# Patient Record
Sex: Male | Born: 1960 | Race: Black or African American | Hispanic: No | Smoking: Never smoker
Health system: Southern US, Community
[De-identification: ages and names within clinical notes are randomized; demographics above are authoritative.]

## PROBLEM LIST (undated history)

## (undated) DIAGNOSIS — N529 Male erectile dysfunction, unspecified: Secondary | ICD-10-CM

## (undated) DIAGNOSIS — I10 Essential (primary) hypertension: Secondary | ICD-10-CM

## (undated) DIAGNOSIS — N179 Acute kidney failure, unspecified: Secondary | ICD-10-CM

## (undated) DIAGNOSIS — I5021 Acute systolic (congestive) heart failure: Secondary | ICD-10-CM

## (undated) DIAGNOSIS — R0600 Dyspnea, unspecified: Secondary | ICD-10-CM

## (undated) DIAGNOSIS — E785 Hyperlipidemia, unspecified: Secondary | ICD-10-CM

## (undated) DIAGNOSIS — N186 End stage renal disease: Secondary | ICD-10-CM

## (undated) DIAGNOSIS — N189 Chronic kidney disease, unspecified: Secondary | ICD-10-CM

## (undated) DIAGNOSIS — Z992 Dependence on renal dialysis: Secondary | ICD-10-CM

## (undated) HISTORY — DX: Essential (primary) hypertension: I10

## (undated) HISTORY — DX: Dependence on renal dialysis: Z99.2

## (undated) HISTORY — DX: Acute systolic (congestive) heart failure: I50.21

## (undated) HISTORY — DX: Male erectile dysfunction, unspecified: N52.9

## (undated) HISTORY — DX: Hyperlipidemia, unspecified: E78.5

## (undated) HISTORY — DX: End stage renal disease: N18.6

## (undated) HISTORY — DX: Chronic kidney disease, unspecified: N18.9

## (undated) HISTORY — PX: HERNIA REPAIR: SHX51

## (undated) HISTORY — DX: Acute kidney failure, unspecified: N17.9

## (undated) HISTORY — DX: Dyspnea, unspecified: R06.00

---

## 2004-10-12 ENCOUNTER — Ambulatory Visit: Payer: Self-pay | Admitting: Nurse Practitioner

## 2004-11-21 ENCOUNTER — Ambulatory Visit: Payer: Self-pay | Admitting: *Deleted

## 2005-02-05 ENCOUNTER — Ambulatory Visit: Payer: Self-pay | Admitting: Nurse Practitioner

## 2008-09-11 ENCOUNTER — Emergency Department (HOSPITAL_BASED_OUTPATIENT_CLINIC_OR_DEPARTMENT_OTHER): Admission: EM | Admit: 2008-09-11 | Discharge: 2008-09-11 | Payer: Self-pay | Admitting: Emergency Medicine

## 2008-10-27 ENCOUNTER — Emergency Department (HOSPITAL_COMMUNITY): Admission: EM | Admit: 2008-10-27 | Discharge: 2008-10-27 | Payer: Self-pay | Admitting: Family Medicine

## 2010-08-22 LAB — POCT CARDIAC MARKERS

## 2010-08-22 LAB — KETONES, QUALITATIVE: Acetone, Bld: NEGATIVE

## 2010-08-22 LAB — GLUCOSE, CAPILLARY: Glucose-Capillary: 321 mg/dL — ABNORMAL HIGH (ref 70–99)

## 2010-08-22 LAB — URINALYSIS, ROUTINE W REFLEX MICROSCOPIC
Hgb urine dipstick: NEGATIVE
Specific Gravity, Urine: 1.023 (ref 1.005–1.030)
Urobilinogen, UA: 1 mg/dL (ref 0.0–1.0)
pH: 5.5 (ref 5.0–8.0)

## 2010-08-22 LAB — DIFFERENTIAL
Basophils Relative: 1 % (ref 0–1)
Lymphocytes Relative: 26 % (ref 12–46)
Lymphs Abs: 2.2 10*3/uL (ref 0.7–4.0)
Monocytes Relative: 7 % (ref 3–12)
Neutro Abs: 5.6 10*3/uL (ref 1.7–7.7)
Neutrophils Relative %: 65 % (ref 43–77)

## 2010-08-22 LAB — BASIC METABOLIC PANEL
Calcium: 9.3 mg/dL (ref 8.4–10.5)
Creatinine, Ser: 1.1 mg/dL (ref 0.4–1.5)
GFR calc Af Amer: >60 mL/min (ref >60)
GFR calc non Af Amer: >60 mL/min (ref >60)
Sodium: 141 mEq/L (ref 135–145)

## 2010-08-22 LAB — URINE MICROSCOPIC-ADD ON

## 2010-08-22 LAB — CBC
MCHC: 32.1 g/dL (ref 30.0–36.0)
RBC: 5.64 MIL/uL (ref 4.22–5.81)
WBC: 8.6 10*3/uL (ref 4.0–10.5)

## 2011-08-20 ENCOUNTER — Ambulatory Visit (INDEPENDENT_AMBULATORY_CARE_PROVIDER_SITE_OTHER): Payer: Managed Care, Other (non HMO) | Admitting: Physician Assistant

## 2011-08-20 VITALS — BP 183/111 | HR 80 | Temp 98.7°F | Resp 16 | Ht 69.0 in | Wt 182.2 lb

## 2011-08-20 DIAGNOSIS — Z125 Encounter for screening for malignant neoplasm of prostate: Secondary | ICD-10-CM

## 2011-08-20 DIAGNOSIS — I10 Essential (primary) hypertension: Secondary | ICD-10-CM

## 2011-08-20 DIAGNOSIS — IMO0001 Reserved for inherently not codable concepts without codable children: Secondary | ICD-10-CM

## 2011-08-20 DIAGNOSIS — N529 Male erectile dysfunction, unspecified: Secondary | ICD-10-CM

## 2011-08-20 DIAGNOSIS — Z139 Encounter for screening, unspecified: Secondary | ICD-10-CM

## 2011-08-20 DIAGNOSIS — Z1211 Encounter for screening for malignant neoplasm of colon: Secondary | ICD-10-CM

## 2011-08-20 DIAGNOSIS — D649 Anemia, unspecified: Secondary | ICD-10-CM

## 2011-08-20 DIAGNOSIS — G629 Polyneuropathy, unspecified: Secondary | ICD-10-CM

## 2011-08-20 LAB — POCT UA - MICROSCOPIC ONLY
Bacteria, U Microscopic: NEGATIVE
Casts, Ur, LPF, POC: NEGATIVE
Crystals, Ur, HPF, POC: NEGATIVE

## 2011-08-20 LAB — POCT CBC
HCT, POC: 41.4 % — AB (ref 43.5–53.7)
Lymph, poc: 2.2 (ref 0.6–3.4)
MCHC: 32.4 g/dL (ref 31.8–35.4)
MCV: 77.3 fL — AB (ref 80–97)
POC Granulocyte: 5.1 (ref 2–6.9)
POC LYMPH PERCENT: 27.5 %L (ref 10–50)
RDW, POC: 14.6 %

## 2011-08-20 LAB — POCT URINALYSIS DIPSTICK
Protein, UA: 100
Spec Grav, UA: 1.01
Urobilinogen, UA: 0.2
pH, UA: 5

## 2011-08-20 LAB — GLUCOSE, POCT (MANUAL RESULT ENTRY): POC Glucose: 237

## 2011-08-20 LAB — POCT GLYCOSYLATED HEMOGLOBIN (HGB A1C): Hemoglobin A1C: 11.3

## 2011-08-20 MED ORDER — CARVEDILOL 6.25 MG PO TABS: 6.2500 mg | ORAL_TABLET | Freq: Two times a day (BID) | ORAL | Status: DC

## 2011-08-20 MED ORDER — INSULIN GLARGINE 100 UNIT/ML ~~LOC~~ SOLN: 10.0000 [IU] | SUBCUTANEOUS | Status: DC

## 2011-08-20 MED ORDER — DILTIAZEM HCL ER 120 MG PO CP24: 120.0000 mg | ORAL_CAPSULE | Freq: Every day | ORAL | Status: DC

## 2011-08-20 MED ORDER — LISINOPRIL-HYDROCHLOROTHIAZIDE 20-25 MG PO TABS: 1.0000 | ORAL_TABLET | Freq: Every day | ORAL | Status: DC

## 2011-08-20 MED ORDER — METFORMIN HCL 1000 MG PO TABS: 1000.0000 mg | ORAL_TABLET | Freq: Two times a day (BID) | ORAL | Status: DC

## 2011-08-20 NOTE — Progress Notes (Signed)
Subjective:    Patient ID: Brandon Carroll, male    DOB: March 17, 1961, 51 y.o.   MRN: [id number]  HPI Patient is here to establish care.  He has a history of poorly controlled diabetes and hypertension. He is having trouble affording of his Bystolic and Cardizem and requests that they be changed to something he can afford.  He checks his blood sugar once daily in the evenings  2 hours before meals.  He states that they are consistently above 200 mg/dL. He has never had diabetic nutritional counselingLast dilated eye exam was 2 years ago.  Patient would like to establish care with local ophthalmologist. . He c/o headaches 2 x week and can tell his BP is running high.   He does need reading glasses but denies any visual changes, blurry vision, double vision, loss of vision, or spots before eyes. Patient has a history of Hyperlipidemia and used to take pravastatin, but is no longer on the medications.  He has not had blood work in several years.  Last Hgb A1c was last year and was "high".  Patient does have ED, he has used sildenafil successfully in the past but says he has "given up on a sex life."  SHx; patient is a single AA male from [place]. 12th grade education.  He works in [employer].  He exercises daily getting at least 30 min of walking and has just begun jogging.    Review of Systems  Constitutional: Negative for fever, chills, activity change, appetite change, fatigue and unexpected weight change.       Patient c/o soaking night sweats  HENT: Negative.   Eyes: Negative.        Patient needs reading glasses.  Respiratory: Negative.   Gastrointestinal: Negative.   Genitourinary: Negative.   Musculoskeletal: Negative.   Neurological: Negative.        C/o intermittent itching and paresthsesias  BL lower extremities  Hematological: Negative.   Psychiatric/Behavioral: Negative.        Objective:   Physical Exam  Constitutional: He appears well-developed and well-nourished. No distress.   HENT:  Head: Normocephalic and atraumatic.  Right Ear: External ear normal.  Left Ear: External ear normal.  Eyes: Pupils are equal, round, and reactive to light. Right eye exhibits no discharge. Left eye exhibits no discharge.       Fundoscopic exam - no papiledema, no A/V nicking, or cotton wool patches. Some silver wiring noted.  Neck: Normal range of motion. Neck supple. No tracheal deviation present.  Cardiovascular: Normal rate, regular rhythm, normal heart sounds and intact distal pulses.   Pulmonary/Chest: Effort normal and breath sounds normal. No stridor. No respiratory distress.  Abdominal: Soft. Bowel sounds are normal. He exhibits no distension and no mass. There is no tenderness. There is no guarding.       Liver is 2 inches below left costal margin  Genitourinary: Rectum normal and prostate normal. Rectal exam shows no external hemorrhoid, no internal hemorrhoid, no fissure, no mass, no tenderness and anal tone normal. Guaiac negative stool.  Musculoskeletal: Normal range of motion. He exhibits no edema and no tenderness.  Lymphadenopathy:    He has no cervical adenopathy.  Neurological: He is alert. He has normal reflexes. No cranial nerve deficit. He exhibits normal muscle tone. Coordination normal.       Absent vibratory sense in BL feet.  Skin: Skin is warm and dry. No rash noted. He is not diaphoretic. No erythema. No pallor.  Some peeling noted on the left plantar foot surface   Results for orders placed in visit on 08/20/11  GLUCOSE, POCT (MANUAL RESULT ENTRY)      Component Value Range   POC Glucose 237    POCT GLYCOSYLATED HEMOGLOBIN (HGB A1C)      Component Value Range   Hemoglobin A1C 11.3    POCT CBC      Component Value Range   WBC 7.9  4.6 - 10.2 (K/uL)   Lymph, poc 2.2  0.6 - 3.4    POC LYMPH PERCENT 27.5  10 - 50 (%L)   MID (cbc) 0.6  0 - 0.9    POC MID % 7.5  0 - 12 (%M)   POC Granulocyte 5.1  2 - 6.9    Granulocyte percent 65.0  37 - 80 (%G)    RBC 5.36  4.69 - 6.13 (M/uL)   Hemoglobin 13.4 (*) 14.1 - 18.1 (g/dL)   HCT, POC 41.4 (*) 43.5 - 53.7 (%)   MCV 77.3 (*) 80 - 97 (fL)   MCH, POC 25.0 (*) 27 - 31.2 (pg)   MCHC 32.4  31.8 - 35.4 (g/dL)   RDW, POC 14.6     Platelet Count, POC 268  142 - 424 (K/uL)   MPV 11.0  0 - 99.8 (fL)  POCT UA - MICROSCOPIC ONLY      Component Value Range   WBC, Ur, HPF, POC 0-1     RBC, urine, microscopic 2.4     Bacteria, U Microscopic neg     Mucus, UA neg     Epithelial cells, urine per micros neg     Crystals, Ur, HPF, POC neg     Casts, Ur, LPF, POC neg     Yeast, UA neg    POCT URINALYSIS DIPSTICK      Component Value Range   Color, UA yellow     Clarity, UA clear     Glucose, UA 500     Bilirubin, UA neg     Ketones, UA neg     Spec Grav, UA 1.010     Blood, UA mod     pH, UA 5.0     Protein, UA 100     Urobilinogen, UA 0.2     Nitrite, UA neg     Leukocytes, UA Negative    IFOBT (OCCULT BLOOD)      Component Value Range   IFOBT Negative            Assessment & Plan:   1. Type II or unspecified type diabetes mellitus without mention of complication, uncontrolled  POCT glucose (manual entry), POCT glycosylated hemoglobin (Hb A1C), Lipid panel, Comprehensive metabolic panel, Microalbumin, urine, Ambulatory referral to Ophthalmology, metFORMIN (GLUCOPHAGE) 1000 MG tablet, insulin glargine (LANTUS) 100 UNIT/ML injection, Ambulatory referral to diabetic education  2. Unspecified essential hypertension  POCT CBC, POCT UA - Microscopic Only, POCT urinalysis dipstick, TSH, lisinopril-hydrochlorothiazide (PRINZIDE,ZESTORETIC) 20-25 MG per tablet, diltiazem (DILACOR XR) 120 MG 24 hr capsule, carvedilol (COREG) 6.25 MG tablet  3. Screening for prostate cancer  PSA  4. Screening for colon cancer  IFOBT POC (occult bld, rslt in office)  5. ED (erectile dysfunction)    6. Anemia    7. Peripheral neuropathy     Re-evaluate in 2 weeks, and make additional adjustments then.   Anticipate increasing carvedilol dose, consider the addition of another glucose lowering agent. Will likely also need statin therapy.

## 2011-08-20 NOTE — Patient Instructions (Signed)
Check your blood sugar once a day. Please check your blood sugar in the morning before you eat and before you take your insulin on the first day. Take your blood sugar 2 hours after your largest meal on the second day. Alternate.  Please take your Lantus in the morning instead of the evening. You may increase your lantus dose by 2 units each morning if your blood sugar is above 140.   DASH Diet The DASH diet stands for "Dietary Approaches to Stop Hypertension." It is a healthy eating plan that has been shown to reduce high blood pressure (hypertension) in as little as 14 days, while also possibly providing other significant health benefits. These other health benefits include reducing the risk of breast cancer after menopause and reducing the risk of type 2 diabetes, heart disease, colon cancer, and stroke. Health benefits also include weight loss and slowing kidney failure in patients with chronic kidney disease.  DIET GUIDELINES  Limit salt (sodium). Your diet should contain less than 1500 mg of sodium daily.   Limit refined or processed carbohydrates. Your diet should include mostly whole grains. Desserts and added sugars should be used sparingly.   Include small amounts of heart-healthy fats. These types of fats include nuts, oils, and tub margarine. Limit saturated and trans fats. These fats have been shown to be harmful in the body.  CHOOSING FOODS  The following food groups are based on a 2000 calorie diet. See your Registered Dietitian for individual calorie needs. Grains and Grain Products (6 to 8 servings daily)  Eat More Often: Whole-wheat bread, brown rice, whole-grain or wheat pasta, quinoa, popcorn without added fat or salt (air popped).   Eat Less Often: White bread, white pasta, white rice, cornbread.  Vegetables (4 to 5 servings daily)  Eat More Often: Fresh, frozen, and canned vegetables. Vegetables may be raw, steamed, roasted, or grilled with a minimal amount of fat.   Eat  Less Often/Avoid: Creamed or fried vegetables. Vegetables in a cheese sauce.  Fruit (4 to 5 servings daily)  Eat More Often: All fresh, canned (in natural juice), or frozen fruits. Dried fruits without added sugar. One hundred percent fruit juice ( cup [237 mL] daily).   Eat Less Often: Dried fruits with added sugar. Canned fruit in light or heavy syrup.  [employer], Fish, and Poultry (2 servings or less daily. One serving is 3 to 4 oz [85-114 g]).  Eat More Often: Ninety percent or leaner ground beef, tenderloin, sirloin. Round cuts of beef, chicken breast, [place] breast. All fish. Grill, bake, or broil your meat. Nothing should be fried.   Eat Less Often/Avoid: Fatty cuts of meat, [place], or chicken leg, thigh, or wing. Fried cuts of meat or fish.  Dairy (2 to 3 servings)  Eat More Often: Low-fat or fat-free milk, low-fat plain or light yogurt, reduced-fat or part-skim cheese.   Eat Less Often/Avoid: Milk (whole, 2%, skim, or chocolate).Whole milk yogurt. Full-fat cheeses.  Nuts, Seeds, and Legumes (4 to 5 servings per week)  Eat More Often: All without added salt.   Eat Less Often/Avoid: Salted nuts and seeds, canned beans with added salt.  Fats and Sweets (limited)  Eat More Often: Vegetable oils, tub margarines without trans fats, sugar-free gelatin. Mayonnaise and salad dressings.   Eat Less Often/Avoid: Coconut oils, palm oils, butter, stick margarine, cream, half and half, cookies, candy, pie.  FOR MORE INFORMATION The Dash Diet Eating Plan: www.dashdiet.org Document Released: 04/19/2011 Document Reviewed: 04/09/2011 Exit[employer] Patient  Information 2012 [place], [place].

## 2011-08-22 ENCOUNTER — Encounter: Payer: Self-pay | Admitting: Physician Assistant

## 2011-08-22 LAB — PSA: PSA: 0.84 ng/mL (ref <=4.00)

## 2011-08-22 LAB — COMPREHENSIVE METABOLIC PANEL
ALT: 19 U/L (ref 0–53)
AST: 18 U/L (ref 0–37)
Albumin: 4.3 g/dL (ref 3.5–5.2)
Calcium: 9.8 mg/dL (ref 8.4–10.5)
Chloride: 101 mEq/L (ref 96–112)
Potassium: 4 mEq/L (ref 3.5–5.3)

## 2011-08-22 LAB — LIPID PANEL
LDL Cholesterol: 110 mg/dL — ABNORMAL HIGH (ref 0–99)
VLDL: 24 mg/dL (ref 0–40)

## 2011-08-28 ENCOUNTER — Telehealth: Payer: Self-pay

## 2011-08-28 NOTE — Telephone Encounter (Signed)
[facility] calling to get last notes faxed for an appt that he has on 09-20-11

## 2011-08-28 NOTE — Telephone Encounter (Signed)
Pt was referred to Dr. Katy Fitch by [facility] and needing records on patient.

## 2011-09-18 ENCOUNTER — Encounter: Payer: Self-pay | Admitting: Physician Assistant

## 2011-09-18 ENCOUNTER — Ambulatory Visit (INDEPENDENT_AMBULATORY_CARE_PROVIDER_SITE_OTHER): Payer: Managed Care, Other (non HMO) | Admitting: Physician Assistant

## 2011-09-18 VITALS — BP 158/102 | HR 75 | Temp 97.4°F | Resp 16 | Ht 68.5 in | Wt 182.0 lb

## 2011-09-18 DIAGNOSIS — N529 Male erectile dysfunction, unspecified: Secondary | ICD-10-CM

## 2011-09-18 DIAGNOSIS — E119 Type 2 diabetes mellitus without complications: Secondary | ICD-10-CM

## 2011-09-18 DIAGNOSIS — E1169 Type 2 diabetes mellitus with other specified complication: Secondary | ICD-10-CM

## 2011-09-18 DIAGNOSIS — I10 Essential (primary) hypertension: Secondary | ICD-10-CM

## 2011-09-18 DIAGNOSIS — E785 Hyperlipidemia, unspecified: Secondary | ICD-10-CM

## 2011-09-18 HISTORY — DX: Type 2 diabetes mellitus with other specified complication: E11.69

## 2011-09-18 MED ORDER — GLUCOSE BLOOD VI STRP: ORAL_STRIP | Status: DC

## 2011-09-18 MED ORDER — LOVASTATIN 20 MG PO TABS: 20.0000 mg | ORAL_TABLET | Freq: Every day | ORAL | Status: DC

## 2011-09-18 MED ORDER — VERAPAMIL HCL 120 MG PO TABS: 120.0000 mg | ORAL_TABLET | Freq: Two times a day (BID) | ORAL | Status: DC

## 2011-09-18 NOTE — Progress Notes (Signed)
  Subjective:    Patient ID: Brandon Carroll, male    DOB: 02-Jan-1961, 51 y.o.   MRN: [id number]  HPI Resents for re-evaluation of HTN and DM type 2. Recall that prior to last month, he was not actively working to control these issues.  He works nights.  Morning glucose in the 200's, evenings in the 300's.    The Cardizem XR 120 mg is not on the $4 list, so he'd like to change that to something that is.  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  He was not aware that his DM and insulin use would also need to be addressed.  Additionally, we need to initiate statin therapy due to elevated lipids.  Review of Systems No chest pain, SOB, HA, dizziness, vision change, N/V, diarrhea, dysuria, myalgias, arthralgias or rash.     Objective:   Physical Exam Vital signs noted. Well-developed, well nourished BM who is awake, alert and oriented, in NAD. HEENT: [place]/AT, PERRL, EOMI.  Sclera and conjunctiva are clear.  EAC are patent, TMs are normal in appearance. Nasal mucosa is pink and moist. OP is clear. Neck: supple, non-tender, no lymphadenopathey, thyromegaly. Heart: RRR, no murmur Lungs: CTA Abdomen: normo-active bowel sounds, supple, non-tender, no mass or organomegaly. Extremities: no cyanosis, clubbing or edema. Skin: warm and dry without rash.     Assessment & Plan:   1. Type II or unspecified type diabetes mellitus without mention of complication, not stated as uncontrolled  glucose blood test strip  2. HTN (hypertension)  verapamil (CALAN) 120 MG tablet  3. Other and unspecified hyperlipidemia  lovastatin (MEVACOR) 20 MG tablet   Patient Instructions  Measure your blood sugar at two times (you can alternate days if you choose): When you have not had anything to eat or drink for 8-12 hours, and 2-3 hours after the largest meal of the day.  These two times should give you the lowest  and highest readings of the day.  We will use them to help adjust your medications to improve your readings.  Go to the diabetes/nutrition education appointment you have coming up.  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 Increase the insulin dose by 2 units each day, when your fasting blood sugar (nothing to eat or drink for 8-12 hours) is above 140.

## 2011-09-18 NOTE — Patient Instructions (Signed)
Measure your blood sugar at two times (you can alternate days if you choose): When you have not had anything to eat or drink for 8-12 hours, and 2-3 hours after the largest meal of the day.  These two times should give you the lowest and highest readings of the day.  We will use them to help adjust your medications to improve your readings.  Go to the diabetes/nutrition education appointment you have coming up.  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 Increase the insulin dose by 2 units each day, when your fasting blood sugar (nothing to eat or drink for 8-12 hours) is above 140.

## 2011-10-01 ENCOUNTER — Ambulatory Visit: Payer: Self-pay | Admitting: *Deleted

## 2011-10-25 ENCOUNTER — Ambulatory Visit: Payer: Managed Care, Other (non HMO) | Admitting: Physician Assistant

## 2011-10-30 ENCOUNTER — Telehealth: Payer: Self-pay

## 2011-10-30 NOTE — Telephone Encounter (Signed)
Pls advise.  

## 2011-10-30 NOTE — Telephone Encounter (Signed)
PATIENT WAS REFERRED TO DR. Zenia Resides OFFICE BY [facility].  HE WAS A NO SHOW ON MAY 9.  THEY HAVE CALLED HIM SEVERAL TIMES, BUT RECEIVED NO ANSWER OR CALL BACKS.  JUST WANTED HIM TO KNOW.

## 2011-11-22 ENCOUNTER — Ambulatory Visit: Payer: Managed Care, Other (non HMO) | Admitting: Physician Assistant

## 2012-04-15 ENCOUNTER — Ambulatory Visit: Payer: Managed Care, Other (non HMO) | Admitting: Physician Assistant

## 2012-04-15 ENCOUNTER — Telehealth: Payer: Self-pay

## 2012-04-15 NOTE — Telephone Encounter (Signed)
Left message for him to call me back.  

## 2012-04-15 NOTE — Telephone Encounter (Signed)
Brandon Carroll-patient called to cancel appointment with you due to his insurance coverage not being in effect.  Patient wants refills on his blood pressure meds to hold him until his insurance situation is straightened out. Told patient I would leave you a message to call him.

## 2012-04-16 ENCOUNTER — Other Ambulatory Visit: Payer: Self-pay | Admitting: Physician Assistant

## 2012-04-16 NOTE — Telephone Encounter (Signed)
Patient advised to call pharmacy,have them send the request, he is unsure of doses/ want to make sure I send correct meds.

## 2012-07-22 ENCOUNTER — Ambulatory Visit (INDEPENDENT_AMBULATORY_CARE_PROVIDER_SITE_OTHER): Payer: BC Managed Care – PPO | Admitting: Physician Assistant

## 2012-07-22 ENCOUNTER — Encounter: Payer: Self-pay | Admitting: Physician Assistant

## 2012-07-22 VITALS — BP 150/92 | HR 71 | Temp 98.1°F | Resp 16 | Ht 68.5 in | Wt 186.2 lb

## 2012-07-22 DIAGNOSIS — I10 Essential (primary) hypertension: Secondary | ICD-10-CM

## 2012-07-22 LAB — POCT GLYCOSYLATED HEMOGLOBIN (HGB A1C): Hemoglobin A1C: 11.3

## 2012-07-22 MED ORDER — LOVASTATIN 20 MG PO TABS: 20.0000 mg | ORAL_TABLET | Freq: Every day | ORAL | Status: DC

## 2012-07-22 MED ORDER — VERAPAMIL HCL 120 MG PO TABS: 120.0000 mg | ORAL_TABLET | Freq: Two times a day (BID) | ORAL | Status: DC

## 2012-07-22 MED ORDER — CARVEDILOL 6.25 MG PO TABS: 6.2500 mg | ORAL_TABLET | Freq: Two times a day (BID) | ORAL | Status: DC

## 2012-07-22 MED ORDER — GLUCOSE BLOOD VI STRP: ORAL_STRIP | Status: AC

## 2012-07-22 MED ORDER — METFORMIN HCL 1000 MG PO TABS: 1000.0000 mg | ORAL_TABLET | Freq: Two times a day (BID) | ORAL | Status: DC

## 2012-07-22 MED ORDER — LISINOPRIL-HYDROCHLOROTHIAZIDE 20-25 MG PO TABS: 1.0000 | ORAL_TABLET | Freq: Every day | ORAL | Status: DC

## 2012-07-22 MED ORDER — SILDENAFIL CITRATE 100 MG PO TABS: 100.0000 mg | ORAL_TABLET | Freq: Every day | ORAL | Status: DC | PRN

## 2012-07-22 MED ORDER — INSULIN GLARGINE 100 UNIT/ML ~~LOC~~ SOLN: 10.0000 [IU] | SUBCUTANEOUS | Status: DC

## 2012-07-22 NOTE — Progress Notes (Signed)
Subjective:    Patient ID: Brandon Carroll, male    DOB: 03-Dec-1960, 52 y.o.   MRN: [id number]  HPI  A 52 year old male presents for f/u of HTN, hyperlipidemia, DM II, and refills.  Pt had issues with insurance and thus has not been compliant with medications.  Last seen in 09/2011. Pt has BP machine at home but has not taken his BPs regularly.  He is also out of glucose strips so he has not taken his BS in quite some time.  Denies HA, dizziness, lightheadedness, myalgias, dysuria, hematuria, back pain, hematochezia, blurred vision, CP, SOB, palpitations, ulcers, or recent illnesses.  Pt walks 45 minutes 5 days/wk for exercise.  Pt has not received flu shot this year.  Pt complains of RIGHT sided flank/abdominal pain since 07/01/12.  He describes his pain as a sharp, random pain that is not present every day and varies in the time it lasts.  He cannot identify any triggers.  Denies f/c, n/v, diarrhea, constipation, decreased appetite.   Last eye exam:  1 yr ago Last dental exam:  1 yr ago   Past Medical History  Diagnosis Date  . Diabetes mellitus   . Hypertension   . Erectile dysfunction   . Hyperlipidemia     Past Surgical History  Procedure Laterality Date  . Hernia repair  7th grade    umbilical  . Umbilical hernia repair  35 years ago    Prior to Admission medications   Medication Sig Start Date End Date Taking? Authorizing Lakea Mittelman  carvedilol (COREG) 6.25 MG tablet Take 1 tablet (6.25 mg total) by mouth 2 (two) times daily with a meal. 07/22/12  Yes Chelle S Jeffery, PA-C  glucose blood test strip Use as instructed 07/22/12 07/22/13 Yes Chelle S Jeffery, PA-C  insulin glargine (LANTUS) 100 UNIT/ML injection Inject 10 Units into the skin every morning. 07/22/12  Yes Chelle S Jeffery, PA-C  lisinopril-hydrochlorothiazide (PRINZIDE,ZESTORETIC) 20-25 MG per tablet Take 1 tablet by mouth daily. 07/22/12  Yes Chelle S Jeffery, PA-C  lovastatin (MEVACOR) 20 MG tablet Take 1 tablet  (20 mg total) by mouth at bedtime. 07/22/12 07/22/13 Yes Chelle S Jeffery, PA-C  metFORMIN (GLUCOPHAGE) 1000 MG tablet Take 1 tablet (1,000 mg total) by mouth 2 (two) times daily with a meal. 07/22/12  Yes Chelle S Jeffery, PA-C  verapamil (CALAN) 120 MG tablet Take 1 tablet (120 mg total) by mouth 2 (two) times daily. 07/22/12  Yes Chelle S Jeffery, PA-C    No Known Allergies  History   Social History  . Marital Status: Divorced    Spouse Name: N/A    Number of Children: 1  . Years of Education: N/A   Occupational History  . Animal nutritionist    Social History Main Topics  . Smoking status: Never Smoker   . Smokeless tobacco: Not on file  . Alcohol Use: No  . Drug Use: No  . Sexually Active: Not Currently     Comment: Erectile dysfunciotn   Other Topics Concern  . Not on file   Social History Narrative   Divorced. Education: [employer]. Exercise: walking 5 days a week for 45 minutes    Family History  Problem Relation Age of Onset  . Arthritis Mother   . COPD Mother   . Diabetes Mother   . Hypertension Mother   . Diabetes Father   . Hypertension Father   . Arthritis Sister     rheumatoid  . COPD Sister   .  Diabetes Brother     Review of Systems   As above.  Objective:   Physical Exam  BP 150/92  Pulse 71  Temp(Src) 98.1 F (36.7 C) (Oral)  Resp 16  Ht 5' 8.5" (1.74 m)  Wt 186 lb 3.2 oz (84.46 kg)  BMI 27.9 kg/m2  SpO2 99%  General:  Pleasant, well-nourished male.  NAD. HEENT:  NCAT.  PERRLA.  EOMs intact.  Conjunctiva clear.  Pinguecula on left medial sclera.  No erythema or bulging/retracting of TMs, landmarks visible.  Nasal congestion.  Edema of turbinates.  Pharynx pink and moist.  Fundoscopic exam-normal. Peripheral vascular:  No c/c/e.  Bilateral 2+ dorsalis pedis pulses.  <2 second capillary refill. Skin:  Warm and moist.  No rashes or ulcers. MSK:  DTRs:  Bilateral 2+ BR, biceps, patellar, ankle reflexes.   Abdomen:  Normal bowel sounds, soft.   Negative Murphy's. No CVA or abdominal tenderness. Heart:  RRR.  Normal S1,S2.  No m/g/r. Lungs:  CTAB. Neuro:  A&O x3.  Cranial nerves intact.  Results for orders placed in visit on 07/22/12  GLUCOSE, POCT (MANUAL RESULT ENTRY)      Result Value Range   POC Glucose 226 (*) 70 - 99 mg/dl  POCT GLYCOSYLATED HEMOGLOBIN (HGB A1C)      Result Value Range   Hemoglobin A1C 11.3         Assessment & Plan:  Type II or unspecified type diabetes mellitus without mention of complication, uncontrolled - Plan: insulin glargine (LANTUS) 100 UNIT/ML injection, metFORMIN (GLUCOPHAGE) 1000 MG tablet, glucose blood test strip, POCT glucose (manual entry), POCT glycosylated hemoglobin (Hb A1C), Microalbumin, urine  HTN (hypertension) - Plan: carvedilol (COREG) 6.25 MG tablet, lisinopril-hydrochlorothiazide (PRINZIDE,ZESTORETIC) 20-25 MG per tablet, verapamil (CALAN) 120 MG tablet, Comprehensive metabolic panel  Hyperlipidemia LDL goal < 70 - Plan: lovastatin (MEVACOR) 20 MG tablet, Lipid panel  Need for influenza vaccination - Plan: Flu vaccine greater than or equal to 3yo preservative free IM  Need for pneumococcal vaccination - Plan: Pneumococcal polysaccharide vaccine 23-valent greater than or equal to 2yo subcutaneous/IM  ED (erectile dysfunction) - Plan: Viagra 100 mg, and control of glucose and BP.  Patient Instructions  Take your blood pressure at the same time every morning and every night.  Keep a diary that includes:  DATE, TIME, and your BLOOD PRESSURE.  We also want you to keep a diary of your blood sugars.  Take your blood sugar every morning before breakfast and 2 hours after your largest meal of the day.  You diary should include:  DATE, TIME, BLOOD SUGAR.  Keep up the good work with walking 45 minutes 5 days a week!  You can gradually try to increase your walks to 1 hour.  Focus on your diet at home!!!!!  Use the diagram as a reference:  50% vegetables, 25% starch, 25% protein.  We will  call schedule you for a follow up in 3 months and schedule you for a complete physical.  In the meantime schedule an eye exam and dental appointment since it has been 1 year since your last.  We will call you when your lab results come back.  If you have any questions don't hesitate to call the clinic.

## 2012-07-22 NOTE — Progress Notes (Signed)
I have examined this patient along with the student and agree. Expected un-controlled DM, given his medication non-compliance.  Re-evaluate in 3 months, with CPE.  Medication adjustments will be made at that time as indicated. [name], PA-C Certified Physician [facility]

## 2012-07-22 NOTE — Patient Instructions (Signed)
Take your blood pressure at the same time every morning and every night.  Keep a diary that includes:  DATE, TIME, and your BLOOD PRESSURE.  We also want you to keep a diary of your blood sugars.  Take your blood sugar every morning before breakfast and 2 hours after your largest meal of the day.  You diary should include:  DATE, TIME, BLOOD SUGAR.  Keep up the good work with walking 45 minutes 5 days a week!  You can gradually try to increase your walks to 1 hour.  Focus on your diet at home!!!!!  Use the diagram as a reference:  50% vegetables, 25% starch, 25% protein.  We will call schedule you for a follow up in 3 months and schedule you for a complete physical.  In the meantime schedule an eye exam and dental appointment since it has been 1 year since your last.  We will call you when your lab results come back.  If you have any questions don't hesitate to call the clinic.

## 2012-07-23 LAB — LIPID PANEL
LDL Cholesterol: 109 mg/dL — ABNORMAL HIGH (ref 0–99)
VLDL: 21 mg/dL (ref 0–40)

## 2012-07-23 LAB — COMPREHENSIVE METABOLIC PANEL
ALT: 37 U/L (ref 0–53)
Albumin: 4.1 g/dL (ref 3.5–5.2)
Alkaline Phosphatase: 142 U/L — ABNORMAL HIGH (ref 39–117)
Glucose, Bld: 228 mg/dL — ABNORMAL HIGH (ref 70–99)
Potassium: 4.6 mEq/L (ref 3.5–5.3)
Sodium: 138 mEq/L (ref 135–145)
Total Bilirubin: 0.4 mg/dL (ref 0.3–1.2)
Total Protein: 7.3 g/dL (ref 6.0–8.3)

## 2012-07-23 LAB — MICROALBUMIN, URINE: Microalb, Ur: 46.25 mg/dL — ABNORMAL HIGH (ref 0.00–1.89)

## 2012-07-25 ENCOUNTER — Encounter: Payer: Self-pay | Admitting: Physician Assistant

## 2012-10-28 ENCOUNTER — Ambulatory Visit (INDEPENDENT_AMBULATORY_CARE_PROVIDER_SITE_OTHER): Payer: BC Managed Care – PPO | Admitting: Physician Assistant

## 2012-10-28 ENCOUNTER — Encounter: Payer: Self-pay | Admitting: Physician Assistant

## 2012-10-28 VITALS — BP 142/86 | HR 66 | Temp 98.2°F | Resp 16 | Ht 68.5 in | Wt 186.0 lb

## 2012-10-28 DIAGNOSIS — G609 Hereditary and idiopathic neuropathy, unspecified: Secondary | ICD-10-CM

## 2012-10-28 DIAGNOSIS — G629 Polyneuropathy, unspecified: Secondary | ICD-10-CM

## 2012-10-28 DIAGNOSIS — I10 Essential (primary) hypertension: Secondary | ICD-10-CM

## 2012-10-28 DIAGNOSIS — E785 Hyperlipidemia, unspecified: Secondary | ICD-10-CM

## 2012-10-28 DIAGNOSIS — N529 Male erectile dysfunction, unspecified: Secondary | ICD-10-CM

## 2012-10-28 LAB — COMPREHENSIVE METABOLIC PANEL
AST: 18 U/L (ref 0–37)
Alkaline Phosphatase: 99 U/L (ref 39–117)
BUN: 15 mg/dL (ref 6–23)
Glucose, Bld: 247 mg/dL — ABNORMAL HIGH (ref 70–99)
Total Bilirubin: 0.5 mg/dL (ref 0.3–1.2)

## 2012-10-28 LAB — LIPID PANEL
HDL: 51 mg/dL (ref >39)
LDL Cholesterol: 100 mg/dL — ABNORMAL HIGH (ref 0–99)
Total CHOL/HDL Ratio: 3.5 Ratio
Triglycerides: 123 mg/dL (ref <150)
VLDL: 25 mg/dL (ref 0–40)

## 2012-10-28 MED ORDER — LOVASTATIN 20 MG PO TABS: 20.0000 mg | ORAL_TABLET | Freq: Every day | ORAL | Status: DC

## 2012-10-28 MED ORDER — VERAPAMIL HCL 120 MG PO TABS: 120.0000 mg | ORAL_TABLET | Freq: Two times a day (BID) | ORAL | Status: DC

## 2012-10-28 MED ORDER — METFORMIN HCL 1000 MG PO TABS
1000.0000 mg | ORAL_TABLET | Freq: Two times a day (BID) | ORAL | Status: DC
Start: 2012-10-28 — End: 2013-09-29

## 2012-10-28 MED ORDER — INSULIN GLARGINE 100 UNIT/ML ~~LOC~~ SOLN: 10.0000 [IU] | SUBCUTANEOUS | Status: DC

## 2012-10-28 MED ORDER — SILDENAFIL CITRATE 100 MG PO TABS: 100.0000 mg | ORAL_TABLET | Freq: Every day | ORAL | Status: DC | PRN

## 2012-10-28 MED ORDER — LISINOPRIL-HYDROCHLOROTHIAZIDE 20-25 MG PO TABS: 1.0000 | ORAL_TABLET | Freq: Every day | ORAL | Status: DC

## 2012-10-28 MED ORDER — CARVEDILOL 6.25 MG PO TABS: 6.2500 mg | ORAL_TABLET | Freq: Two times a day (BID) | ORAL | Status: DC

## 2012-10-28 NOTE — Progress Notes (Signed)
I have examined this patient along with the student and agree. In addition, re-refer to diabetes education as well (was referred last year and did not go).  Patient Instructions  Keep working on healthy eating and regular exercise. INCREASE the Lantus to 16 u each morning. Bring your blood sugar log with you to the next visit.

## 2012-10-28 NOTE — Patient Instructions (Addendum)
Keep working on healthy eating and regular exercise. INCREASE the Lantus to 16 u each morning. Bring your blood sugar log with you to the next visit.

## 2012-10-28 NOTE — Progress Notes (Signed)
  Subjective:    Patient ID: Brandon Carroll, male    DOB: [birth year], 52 y.o.   MRN: [id number]  HPI  Presents for 3 month follow up of DM, HTN, hyperlipidemia. Reports home measurements of BP are consistently in 140/80 range. Reports he is able to take his medication as directed now that he has access to 90 day supplies, and he has noticed a decrease in his BP as a result. Home measurements of glucose are 140s in the morning and higher during the rest of the day. 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 He requests another appointment for opthalmology now that his insurance will cover more of the visit. He also requests some of his medications be moved to a different pharmacy ([facility] [address]) where he believes they will be cheaper.    Review of Systems Denies: vision changes, headache, chest pain, SOB, numbness/tingling in extremities, swelling in extremities.     Objective:   Physical Exam  General: WDWN male, appears stated age, NAD Resp: clear to auscultation anterior and posterior fields, no rales/rhonchi/wheezes  Cardiac: RRR, no murmurs/rubs/gallops Abdomen: soft, non distended, non tender to palpation, normal bowel sounds Extremities: moves all limbs spontaneously, no pitting edema, distal pulses intact, 2+ reflexes in achilles patellar brachioradialis and biceps, see diabetic foot exam Skin: no rashes, lesions, erythema, or edema  Results for orders placed in visit on 10/28/12  GLUCOSE, POCT (MANUAL RESULT ENTRY)      Result Value Range   POC Glucose 258 (*) 70 - 99 mg/dl  POCT GLYCOSYLATED HEMOGLOBIN (HGB A1C)      Result Value Range   Hemoglobin A1C 11.6         Assessment & Plan:   Type II or unspecified type diabetes mellitus without mention of complication, uncontrolled - Plan: Comprehensive metabolic panel, Ambulatory referral to Ophthalmology, Microalbumin, urine,  POCT glucose (manual entry), POCT glycosylated hemoglobin (Hb A1C), metFORMIN (GLUCOPHAGE) 1000 MG tablet, insulin glargine (LANTUS) 100 UNIT/ML injection, Ambulatory referral to diabetic education  HTN (hypertension) - Plan: carvedilol (COREG) 6.25 MG tablet, lisinopril-hydrochlorothiazide (PRINZIDE,ZESTORETIC) 20-25 MG per tablet, verapamil (CALAN) 120 MG tablet  Hyperlipidemia LDL goal < 70 - Plan: Lipid Panel, lovastatin (MEVACOR) 20 MG tablet  ED (erectile dysfunction) - Plan: sildenafil (VIAGRA) 100 MG tablet  Peripheral neuropathy  HTN shows good improvement.  Increase lantus to 16 units in the morning for better control over DM.  Refer to ophthalmology.  Review lipid panel when available.

## 2012-10-29 ENCOUNTER — Encounter: Payer: Self-pay | Admitting: Physician Assistant

## 2012-10-29 LAB — MICROALBUMIN, URINE: Microalb, Ur: 33.85 mg/dL — ABNORMAL HIGH (ref 0.00–1.89)

## 2012-11-06 ENCOUNTER — Ambulatory Visit: Payer: Self-pay | Admitting: Dietician

## 2012-11-10 ENCOUNTER — Telehealth: Payer: Self-pay | Admitting: Physician Assistant

## 2012-11-10 NOTE — Telephone Encounter (Signed)
Called. Left message for him to call me back.

## 2012-11-10 NOTE — Telephone Encounter (Signed)
Please call this patient.  I received a note from the Nutrition/Diabetes counseling office that he didn't show for his appointment there.  What happened?  I encourage him to reschedule.

## 2012-11-11 NOTE — Telephone Encounter (Signed)
Called again, left message.

## 2012-11-14 NOTE — Telephone Encounter (Signed)
Left message encouraging him to reschedule appt. And to call us with any questions.

## 2012-12-09 ENCOUNTER — Ambulatory Visit: Payer: BC Managed Care – PPO | Admitting: Physician Assistant

## 2012-12-19 ENCOUNTER — Ambulatory Visit: Payer: Self-pay | Admitting: Dietician

## 2012-12-23 ENCOUNTER — Ambulatory Visit: Payer: BC Managed Care – PPO | Admitting: Physician Assistant

## 2013-09-28 ENCOUNTER — Telehealth: Payer: Self-pay

## 2013-09-28 DIAGNOSIS — I10 Essential (primary) hypertension: Secondary | ICD-10-CM

## 2013-09-28 DIAGNOSIS — IMO0001 Reserved for inherently not codable concepts without codable children: Secondary | ICD-10-CM

## 2013-09-28 DIAGNOSIS — E785 Hyperlipidemia, unspecified: Secondary | ICD-10-CM

## 2013-09-28 DIAGNOSIS — E1165 Type 2 diabetes mellitus with hyperglycemia: Secondary | ICD-10-CM

## 2013-09-28 NOTE — Telephone Encounter (Signed)
Pt called and is needing refills on all prescriptions by Dr. Domingo Mend ; please call [phone]

## 2013-09-29 MED ORDER — LISINOPRIL-HYDROCHLOROTHIAZIDE 20-25 MG PO TABS: 1.0000 | ORAL_TABLET | Freq: Every day | ORAL | Status: DC

## 2013-09-29 MED ORDER — INSULIN GLARGINE 100 UNIT/ML ~~LOC~~ SOLN: 10.0000 [IU] | SUBCUTANEOUS | Status: DC

## 2013-09-29 MED ORDER — LOVASTATIN 20 MG PO TABS: 20.0000 mg | ORAL_TABLET | Freq: Every day | ORAL | Status: DC

## 2013-09-29 MED ORDER — CARVEDILOL 6.25 MG PO TABS: 6.2500 mg | ORAL_TABLET | Freq: Two times a day (BID) | ORAL | Status: DC

## 2013-09-29 MED ORDER — VERAPAMIL HCL 120 MG PO TABS: 120.0000 mg | ORAL_TABLET | Freq: Two times a day (BID) | ORAL | Status: DC

## 2013-09-29 MED ORDER — METFORMIN HCL 1000 MG PO TABS: 1000.0000 mg | ORAL_TABLET | Freq: Two times a day (BID) | ORAL | Status: DC

## 2013-09-29 NOTE — Telephone Encounter (Signed)
Sent in all Rxs for 1 mos other than Viagra. Pt is very overdue for f/up. Tried to call no ans/no VM.

## 2014-01-15 ENCOUNTER — Other Ambulatory Visit: Payer: Self-pay | Admitting: Physician Assistant

## 2014-01-18 NOTE — Telephone Encounter (Signed)
Could not reach pt on phone, no ans/no VM. Tried to call W # and is out of order. Sent in 2 wk supply of maint meds and sent unable to reach letter.

## 2014-03-30 ENCOUNTER — Telehealth: Payer: Self-pay

## 2014-03-30 DIAGNOSIS — E119 Type 2 diabetes mellitus without complications: Secondary | ICD-10-CM

## 2014-03-30 NOTE — Telephone Encounter (Signed)
Patient is wanting to know if we could give her enough medicine until her insurance kicks in at the beginning of January.   Best#: [phone]

## 2014-03-31 MED ORDER — METFORMIN HCL 1000 MG PO TABS: 1000.0000 mg | ORAL_TABLET | Freq: Two times a day (BID) | ORAL | Status: DC

## 2014-03-31 MED ORDER — LOVASTATIN 20 MG PO TABS: 20.0000 mg | ORAL_TABLET | Freq: Every day | ORAL | Status: DC

## 2014-03-31 MED ORDER — VERAPAMIL HCL 120 MG PO TABS: 120.0000 mg | ORAL_TABLET | Freq: Two times a day (BID) | ORAL | Status: DC

## 2014-03-31 MED ORDER — LISINOPRIL-HYDROCHLOROTHIAZIDE 20-25 MG PO TABS: 1.0000 | ORAL_TABLET | Freq: Every day | ORAL | Status: DC

## 2014-03-31 MED ORDER — CARVEDILOL 6.25 MG PO TABS: 6.2500 mg | ORAL_TABLET | Freq: Two times a day (BID) | ORAL | Status: DC

## 2014-03-31 MED ORDER — INSULIN GLARGINE 100 UNIT/ML ~~LOC~~ SOLN: 10.0000 [IU] | SUBCUTANEOUS | Status: DC

## 2014-03-31 NOTE — Telephone Encounter (Signed)
Sent the Mevacor, but that is for cholesterol. OK to refill bp meds, too, if he needs those. Needs follow-up in January as planned.

## 2014-03-31 NOTE — Telephone Encounter (Signed)
Pt called again today and doesn't have insurance or no money right now to come in for a recheck, really in need of his bp meds. Please call [phone] It is the mevacor 20 mgs     [facility] [address]

## 2014-04-27 ENCOUNTER — Other Ambulatory Visit: Payer: Self-pay

## 2014-04-27 NOTE — Telephone Encounter (Signed)
Patient needs refill on the following medications:  Verapamil  Carvedilol Lisinopril  Lovastatin  Metformin  [facility] [address]

## 2014-04-28 NOTE — Telephone Encounter (Signed)
This patient hasn't been here since 10/2012. He needs follow up and fasting labs. What is the plan?

## 2014-04-29 MED ORDER — LISINOPRIL-HYDROCHLOROTHIAZIDE 20-25 MG PO TABS: 1.0000 | ORAL_TABLET | Freq: Every day | ORAL | Status: DC

## 2014-04-29 MED ORDER — VERAPAMIL HCL 120 MG PO TABS: 120.0000 mg | ORAL_TABLET | Freq: Two times a day (BID) | ORAL | Status: DC

## 2014-04-29 MED ORDER — CARVEDILOL 6.25 MG PO TABS: 6.2500 mg | ORAL_TABLET | Freq: Two times a day (BID) | ORAL | Status: DC

## 2014-04-29 MED ORDER — METFORMIN HCL 1000 MG PO TABS: 1000.0000 mg | ORAL_TABLET | Freq: Two times a day (BID) | ORAL | Status: DC

## 2014-04-29 NOTE — Telephone Encounter (Addendum)
Correction: phone message was from 03/30/14, but gave permission to fill until Jan.

## 2014-04-29 NOTE — Telephone Encounter (Signed)
Advised pt done but absolutely need to see first part of Jan bf meds run out. Pt agreed.

## 2014-04-29 NOTE — Telephone Encounter (Signed)
See ph message from 04/29/14, Chelle gave permission for 1 mos w/f/up in Jan. She had already sent in lovastatin.

## 2014-04-29 NOTE — Addendum Note (Signed)
Addended by: Elwyn Reach A on: 04/29/2014 08:51 AM   Modules accepted: Orders

## 2014-07-12 ENCOUNTER — Ambulatory Visit (INDEPENDENT_AMBULATORY_CARE_PROVIDER_SITE_OTHER): Payer: Self-pay | Admitting: Physician Assistant

## 2014-07-12 VITALS — BP 158/92 | HR 74 | Temp 97.5°F | Resp 16 | Ht 70.0 in | Wt 174.6 lb

## 2014-07-12 DIAGNOSIS — E785 Hyperlipidemia, unspecified: Secondary | ICD-10-CM

## 2014-07-12 DIAGNOSIS — Z6825 Body mass index (BMI) 25.0-25.9, adult: Secondary | ICD-10-CM

## 2014-07-12 DIAGNOSIS — N529 Male erectile dysfunction, unspecified: Secondary | ICD-10-CM

## 2014-07-12 DIAGNOSIS — I10 Essential (primary) hypertension: Secondary | ICD-10-CM

## 2014-07-12 DIAGNOSIS — IMO0002 Reserved for concepts with insufficient information to code with codable children: Secondary | ICD-10-CM

## 2014-07-12 DIAGNOSIS — E1165 Type 2 diabetes mellitus with hyperglycemia: Secondary | ICD-10-CM

## 2014-07-12 MED ORDER — LISINOPRIL-HYDROCHLOROTHIAZIDE 20-25 MG PO TABS: 1.0000 | ORAL_TABLET | Freq: Every day | ORAL | Status: DC

## 2014-07-12 MED ORDER — VERAPAMIL HCL 120 MG PO TABS: 120.0000 mg | ORAL_TABLET | Freq: Two times a day (BID) | ORAL | Status: DC

## 2014-07-12 MED ORDER — INSULIN GLARGINE 100 UNIT/ML ~~LOC~~ SOLN: 10.0000 [IU] | SUBCUTANEOUS | Status: DC

## 2014-07-12 MED ORDER — LOVASTATIN 20 MG PO TABS: 20.0000 mg | ORAL_TABLET | Freq: Every day | ORAL | Status: DC

## 2014-07-12 MED ORDER — SILDENAFIL CITRATE 100 MG PO TABS: 100.0000 mg | ORAL_TABLET | Freq: Every day | ORAL | Status: DC | PRN

## 2014-07-12 MED ORDER — CARVEDILOL 12.5 MG PO TABS: 12.5000 mg | ORAL_TABLET | Freq: Two times a day (BID) | ORAL | Status: DC

## 2014-07-12 MED ORDER — METFORMIN HCL 1000 MG PO TABS: 1000.0000 mg | ORAL_TABLET | Freq: Two times a day (BID) | ORAL | Status: DC

## 2014-07-12 NOTE — Progress Notes (Signed)
Subjective:   Patient ID: Brandon Carroll, male     DOB: 11/03/1960, 54 y.o.    MRN: [id number]  PCP: No primary care provider on file.  Chief Complaint  Patient presents with  . Hypertension  . Medication Refill  . Diabetes    Outpatient Prescriptions Prior to Visit  Medication Sig Dispense Refill  . carvedilol (COREG) 6.25 MG tablet Take 1 tablet (6.25 mg total) by mouth 2 (two) times daily with a meal. NO MORE REFILLS WITHOUT OFFICE VISIT - 2ND NOTICE 60 tablet 0  . lisinopril-hydrochlorothiazide (PRINZIDE,ZESTORETIC) 20-25 MG per tablet Take 1 tablet by mouth daily. NO MORE REFILLS WITHOUT OFFICE VISIT - 2ND NOTICE 30 tablet 0  . lovastatin (MEVACOR) 20 MG tablet Take 1 tablet (20 mg total) by mouth daily. 30 tablet 2  . metFORMIN (GLUCOPHAGE) 1000 MG tablet Take 1 tablet (1,000 mg total) by mouth 2 (two) times daily with a meal. NO MORE REFILLS WITHOUT OFFICE VISIT - 2ND NOTICE 60 tablet 0  . verapamil (CALAN) 120 MG tablet Take 1 tablet (120 mg total) by mouth 2 (two) times daily. NO MORE REFILLS WITHOUT OFFICE VISIT - 2ND NOTICE 60 tablet 0  . insulin glargine (LANTUS) 100 UNIT/ML injection Inject 0.1 mLs (10 Units total) into the skin every morning. PATIENT NEEDS OFFICE VISIT FOR ADDITIONAL REFILLS (Patient not taking: Reported on 07/12/2014) 10 mL 0  . sildenafil (VIAGRA) 100 MG tablet Take 1 tablet (100 mg total) by mouth daily as needed for erectile dysfunction. (Patient not taking: Reported on 07/12/2014) 10 tablet 5   No facility-administered medications prior to visit.    No Known Allergies  Patient Active Problem List   Diagnosis Date Noted  . BMI 25.0-25.9,adult 07/12/2014  . DM (diabetes mellitus), type 2, uncontrolled 09/18/2011  . HTN (hypertension) 09/18/2011  . ED (erectile dysfunction) 09/18/2011  . Hyperlipidemia with target LDL less than 70 09/18/2011    Family History  Problem Relation Age of Onset  . Arthritis Mother   . COPD Mother   . Diabetes  Mother   . Hypertension Mother   . Diabetes Father   . Hypertension Father   . Arthritis Sister     rheumatoid  . COPD Sister   . Diabetes Brother     History   Social History  . Marital Status: Divorced    Spouse Name: n/a  . Number of Children: 1  . Years of Education: 12+   Occupational History  . Animal nutritionist    Social History Main Topics  . Smoking status: Never Smoker   . Smokeless tobacco: Never Used  . Alcohol Use: No  . Drug Use: No  . Sexual Activity: Not Currently     Comment: Erectile dysfunciotn   Other Topics Concern  . Not on file   Social History Narrative   Divorced. Education: [employer]. Exercise: walking 5 days a week for 45 minutes   Lives alone.   Son lives in [place].     HPI  Presents for medication refills. Last seen here 10/2012. He has uncontrolled DM, hyperlipidemia and HTN. He is currently uninsured, but has applied for coverage through the [facility] and expects to have coverage next month.  In the meantime, he cannot afford the Lantus, which costs $299/month.  Home BP 150's/90's Home glucose 200's  Has not had a flu vaccine this season (had one last year). Has had pneumovax. Tdap is current.  Review of Systems ROS Denies chest pain, shortness of breath, HA,  dizziness, vision change, nausea, vomiting, diarrhea, constipation, melena, hematochezia, dysuria, increased urinary urgency or frequency, increased hunger or thirst, unintentional weight change, unexplained myalgias or arthralgias, rash.      Objective:  Physical Exam Physical Exam  Constitutional: He is oriented to person, place, and time. Vital signs are normal. He appears well-developed and well-nourished. He is active and cooperative. No distress.  BP 158/92 mmHg  Pulse 74  Temp(Src) 97.5 F (36.4 C) (Oral)  Resp 16  Ht 5\' 10"  (1.778 m)  Wt 174 lb 9.6 oz (79.198 kg)  BMI 25.05 kg/m2  SpO2 98%  HENT:  Head: Normocephalic and atraumatic.  Right Ear: Hearing  normal.  Left Ear: Hearing normal.  Eyes: Conjunctivae are normal. No scleral icterus.  Neck: Normal range of motion. Neck supple. No thyromegaly present.  Cardiovascular: Normal rate, regular rhythm and normal heart sounds.   Pulses:      Radial pulses are 2+ on the right side, and 2+ on the left side.  Pulmonary/Chest: Effort normal and breath sounds normal.  Lymphadenopathy:       Head (right side): No tonsillar, no preauricular, no posterior auricular and no occipital adenopathy present.       Head (left side): No tonsillar, no preauricular, no posterior auricular and no occipital adenopathy present.    He has no cervical adenopathy.       Right: No supraclavicular adenopathy present.       Left: No supraclavicular adenopathy present.  Neurological: He is alert and oriented to person, place, and time. No sensory deficit.  Skin: Skin is warm, dry and intact. No rash noted. No cyanosis or erythema. Nails show no clubbing.  Psychiatric: He has a normal mood and affect.   Diabetic Foot Exam - Simple   Simple Foot Form  Diabetic Foot exam was performed with the following findings:  Yes 07/12/2014  1:51 PM  Visual Inspection  No deformities, no ulcerations, no other skin breakdown bilaterally:  Yes  Sensation Testing  Intact to touch and monofilament testing bilaterally:  Yes  Pulse Check  Posterior Tibialis and Dorsalis pulse intact bilaterally:  Yes  Comments     No labs performed today due to inability to afford them at this time.          Assessment & Plan:  1. DM (diabetes mellitus), type 2, uncontrolled Continue current treatment, but with Lantus at 16 units SQ. Printed forms for [employer]'s prescription assistance program. He'll RTC next month once he has insurance and we'll update his labs and get him scheduled for DM education. - metFORMIN (GLUCOPHAGE) 1000 MG tablet; Take 1 tablet (1,000 mg total) by mouth 2 (two) times daily with a meal.  Dispense: 60 tablet; Refill:  5 - insulin glargine (LANTUS) 100 UNIT/ML injection; Inject 0.1 mLs (10 Units total) into the skin every morning.  Dispense: 10 mL; Refill: 12  2. Essential hypertension Increase carvedilol to 12.5 mg BID. Continue current lisinopril-HCTZ and verapamil doses. - lisinopril-hydrochlorothiazide (PRINZIDE,ZESTORETIC) 20-25 MG per tablet; Take 1 tablet by mouth daily.  Dispense: 30 tablet; Refill: 5 - carvedilol (COREG) 12.5 MG tablet; Take 1 tablet (12.5 mg total) by mouth 2 (two) times daily with a meal.  Dispense: 60 tablet; Refill: 5 - verapamil (CALAN) 120 MG tablet; Take 1 tablet (120 mg total) by mouth 2 (two) times daily.  Dispense: 60 tablet; Refill: 5  3. Hyperlipidemia with target LDL less than 70 Continue lovastatin until labs can be updated. - lovastatin (MEVACOR) 20 MG  tablet; Take 1 tablet (20 mg total) by mouth daily.  Dispense: 30 tablet; Refill: 5  4. BMI 25.0-25.9,adult Healthy eating, regular exercise.  5. Erectile dysfunction, unspecified erectile dysfunction type Likely due, at least in part, to uncontrolled HTN and DM. See above. Consider testosterone level at lab draw. - sildenafil (VIAGRA) 100 MG tablet; Take 1 tablet (100 mg total) by mouth daily as needed for erectile dysfunction.  Dispense: 10 tablet; Refill: 5   [name], PA-C Physician Assistant-Certified [facility]

## 2014-07-12 NOTE — Patient Instructions (Signed)
Complete the application for the prescription assistance program [employer] offers to help pay for Lantus. If anything requires my signature or a letter from me, please let me know.

## 2014-08-04 ENCOUNTER — Ambulatory Visit (INDEPENDENT_AMBULATORY_CARE_PROVIDER_SITE_OTHER): Payer: BLUE CROSS/BLUE SHIELD | Admitting: Physician Assistant

## 2014-08-04 VITALS — BP 148/98 | HR 73 | Temp 98.0°F | Resp 17 | Ht 69.0 in | Wt 170.0 lb

## 2014-08-04 DIAGNOSIS — N529 Male erectile dysfunction, unspecified: Secondary | ICD-10-CM

## 2014-08-04 DIAGNOSIS — Z1211 Encounter for screening for malignant neoplasm of colon: Secondary | ICD-10-CM | POA: Diagnosis not present

## 2014-08-04 DIAGNOSIS — Z6825 Body mass index (BMI) 25.0-25.9, adult: Secondary | ICD-10-CM | POA: Diagnosis not present

## 2014-08-04 DIAGNOSIS — B353 Tinea pedis: Secondary | ICD-10-CM | POA: Diagnosis not present

## 2014-08-04 DIAGNOSIS — Z23 Encounter for immunization: Secondary | ICD-10-CM | POA: Diagnosis not present

## 2014-08-04 DIAGNOSIS — D649 Anemia, unspecified: Secondary | ICD-10-CM

## 2014-08-04 DIAGNOSIS — E1165 Type 2 diabetes mellitus with hyperglycemia: Secondary | ICD-10-CM

## 2014-08-04 DIAGNOSIS — IMO0002 Reserved for concepts with insufficient information to code with codable children: Secondary | ICD-10-CM

## 2014-08-04 DIAGNOSIS — I10 Essential (primary) hypertension: Secondary | ICD-10-CM

## 2014-08-04 DIAGNOSIS — E785 Hyperlipidemia, unspecified: Secondary | ICD-10-CM | POA: Diagnosis not present

## 2014-08-04 LAB — LIPID PANEL
CHOL/HDL RATIO: 3.3 ratio
Cholesterol: 174 mg/dL (ref 0–200)
HDL: 52 mg/dL (ref >=40)
LDL CALC: 104 mg/dL — AB (ref 0–99)
TRIGLYCERIDES: 92 mg/dL (ref <150)
VLDL: 18 mg/dL (ref 0–40)

## 2014-08-04 LAB — POCT CBC
Granulocyte percent: 73.9 %G (ref 37–80)
HEMATOCRIT: 40.9 % — AB (ref 43.5–53.7)
Hemoglobin: 12.6 g/dL — AB (ref 14.1–18.1)
LYMPH, POC: 1.5 (ref 0.6–3.4)
MCH, POC: 25.3 pg — AB (ref 27–31.2)
MCHC: 30.7 g/dL — AB (ref 31.8–35.4)
MCV: 82.3 fL (ref 80–97)
MID (cbc): 0.6 (ref 0–0.9)
MPV: 8.5 fL (ref 0–99.8)
PLATELET COUNT, POC: 244 10*3/uL (ref 142–424)
POC GRANULOCYTE: 6.1 (ref 2–6.9)
POC LYMPH %: 18.8 % (ref 10–50)
POC MID %: 7.3 % (ref 0–12)
RBC: 4.97 M/uL (ref 4.69–6.13)
RDW, POC: 13.6 %
WBC: 8.2 10*3/uL (ref 4.6–10.2)

## 2014-08-04 LAB — COMPREHENSIVE METABOLIC PANEL
ALT: 15 U/L (ref 0–53)
AST: 16 U/L (ref 0–37)
Albumin: 3.9 g/dL (ref 3.5–5.2)
Alkaline Phosphatase: 104 U/L (ref 39–117)
BILIRUBIN TOTAL: 0.4 mg/dL (ref 0.2–1.2)
BUN: 14 mg/dL (ref 6–23)
CALCIUM: 9.4 mg/dL (ref 8.4–10.5)
CHLORIDE: 101 meq/L (ref 96–112)
CO2: 26 meq/L (ref 19–32)
CREATININE: 1.21 mg/dL (ref 0.50–1.35)
GLUCOSE: 164 mg/dL — AB (ref 70–99)
Potassium: 4.1 mEq/L (ref 3.5–5.3)
Sodium: 137 mEq/L (ref 135–145)
TOTAL PROTEIN: 7.1 g/dL (ref 6.0–8.3)

## 2014-08-04 LAB — POCT GLYCOSYLATED HEMOGLOBIN (HGB A1C): Hemoglobin A1C: 8.4

## 2014-08-04 LAB — GLUCOSE, POCT (MANUAL RESULT ENTRY): POC Glucose: 171 mg/dl — AB (ref 70–99)

## 2014-08-04 LAB — TSH: TSH: 2.102 u[IU]/mL (ref 0.350–4.500)

## 2014-08-04 NOTE — Progress Notes (Addendum)
Patient ID: Brandon Carroll, male    DOB: 05/04/1961, 54 y.o.   MRN: [id number]  PCP: No primary care provider on file.  Subjective:   Chief Complaint  Patient presents with  . Follow-up    diabetes, hbp     HPI  Brandon Carroll presents for re-evaluation of HTN and diabetes. Since I last saw him, he has acquired health insurance and needs to have some labs.  He did not tolerate the increase of carvedilol from 6.25 mg BID to 12.5 mg BID.  He felt lightheaded, so has been cutting the pills in half.   He also notes that the Viagra is ineffective.  He has not yet received the supply of Lantus through the [employer] patient assistance program, so he's only been using 12 units daily instead of the 16 units planned.  He checks his fasting glucose every day when he gets up for work (he works 7p-5a) and notes it's been in the 200's.  Review of Systems Review of Systems  Constitutional: Negative for activity change, appetite change, fatigue and unexpected weight change.  HENT: Negative for congestion, dental problem, ear pain, hearing loss, mouth sores, postnasal drip, rhinorrhea, sneezing, sore throat, tinnitus and trouble swallowing.   Eyes: Negative for photophobia, pain, redness and visual disturbance.  Respiratory: Negative for cough, chest tightness and shortness of breath.   Cardiovascular: Negative for chest pain, palpitations and leg swelling.  Gastrointestinal: Negative for nausea, vomiting, abdominal pain, diarrhea, constipation and blood in stool.  Genitourinary: Negative for dysuria, urgency, frequency and hematuria.  Musculoskeletal: Negative for myalgias, arthralgias, gait problem and neck stiffness.  Skin: Negative for rash.  Neurological: Negative for dizziness, speech difficulty, weakness, light-headedness, numbness and headaches.  Hematological: Negative for adenopathy.  Psychiatric/Behavioral: Negative for confusion and sleep disturbance. The patient is not nervous/anxious.         Patient Active Problem List   Diagnosis Date Noted  . BMI 25.0-25.9,adult 07/12/2014  . DM (diabetes mellitus), type 2, uncontrolled 09/18/2011  . HTN (hypertension) 09/18/2011  . ED (erectile dysfunction) 09/18/2011  . Hyperlipidemia with target LDL less than 70 09/18/2011    No Known Allergies  Prior to Admission medications   Medication Sig Start Date End Date Taking? Authorizing Provider  carvedilol (COREG) 12.5 MG tablet Take 1 tablet (12.5 mg total) by mouth 2 (two) times daily with a meal.*he is taking 1/2 tablet daily 07/12/14  Yes Talonda Artist S Nazir Hacker, PA-C  insulin glargine (LANTUS) 100 UNIT/ML injection Inject 0.1 mLs (10 Units total) into the skin every morning. 07/12/14  Yes Samyia Motter S Blas Riches, PA-C  lisinopril-hydrochlorothiazide (PRINZIDE,ZESTORETIC) 20-25 MG per tablet Take 1 tablet by mouth daily. 07/12/14  Yes Najae Filsaime S Jamyah Folk, PA-C  lovastatin (MEVACOR) 20 MG tablet Take 1 tablet (20 mg total) by mouth daily. 07/12/14  Yes Timberlyn Pickford S Joselyne Spake, PA-C  metFORMIN (GLUCOPHAGE) 1000 MG tablet Take 1 tablet (1,000 mg total) by mouth 2 (two) times daily with a meal. 07/12/14  Yes Yusif Gnau S Rian Koon, PA-C  sildenafil (VIAGRA) 100 MG tablet Take 1 tablet (100 mg total) by mouth daily as needed for erectile dysfunction. 07/12/14  Yes Norlene Lanes S Shene Maxfield, PA-C  verapamil (CALAN) 120 MG tablet Take 1 tablet (120 mg total) by mouth 2 (two) times daily. 07/12/14  Yes [name], PA-C     Past Medical, Surgical Family and Social History reviewed and updated.        Objective:  Physical Exam  Physical Exam  Constitutional: He is oriented to person,  place, and time. Vital signs are normal. He appears well-developed and well-nourished. He is active and cooperative. No distress.  BP 148/98 mmHg  Pulse 73  Temp(Src) 98 F (36.7 C) (Oral)  Resp 17  Ht 5\' 9"  (1.753 m)  Wt 170 lb (77.111 kg)  BMI 25.09 kg/m2  SpO2 97%  HENT:  Head: Normocephalic and atraumatic.  Right Ear: Hearing  normal.  Left Ear: Hearing normal.  Eyes: Conjunctivae are normal. No scleral icterus.  Neck: Normal range of motion. Neck supple. No thyromegaly present.  Cardiovascular: Normal rate, regular rhythm and normal heart sounds.   Pulses:      Radial pulses are 2+ on the right side, and 2+ on the left side.  Pulmonary/Chest: Effort normal and breath sounds normal.  Lymphadenopathy:       Head (right side): No tonsillar, no preauricular, no posterior auricular and no occipital adenopathy present.       Head (left side): No tonsillar, no preauricular, no posterior auricular and no occipital adenopathy present.    He has no cervical adenopathy.       Right: No supraclavicular adenopathy present.       Left: No supraclavicular adenopathy present.  Neurological: He is alert and oriented to person, place, and time. No sensory deficit.  Skin: Skin is warm, dry and intact. Rash noted. Rash is maculopapular. No cyanosis or erythema. Nails show no clubbing.  Psychiatric: He has a normal mood and affect.   Diabetic Foot Exam - Simple   Simple Foot Form  Diabetic Foot exam was performed with the following findings:  Yes 08/04/2014  2:32 PM  Visual Inspection  No deformities, no ulcerations, no other skin breakdown bilaterally:  Yes  See comments:  Yes  Sensation Testing  Intact to touch and monofilament testing bilaterally:  Yes  Pulse Check  Posterior Tibialis and Dorsalis pulse intact bilaterally:  Yes  Comments  Lesions consistent with tinea noted in the arch of the LEFT foot         Results for orders placed or performed in visit on 08/04/14  POCT CBC  Result Value Ref Range   WBC 8.2 4.6 - 10.2 K/uL   Lymph, poc 1.5 0.6 - 3.4   POC LYMPH PERCENT 18.8 10 - 50 %L   MID (cbc) 0.6 0 - 0.9   POC MID % 7.3 0 - 12 %M   POC Granulocyte 6.1 2 - 6.9   Granulocyte percent 73.9 37 - 80 %G   RBC 4.97 4.69 - 6.13 M/uL   Hemoglobin 12.6 (A) 14.1 - 18.1 g/dL   HCT, POC 40.9 (A) 43.5 - 53.7 %    MCV 82.3 80 - 97 fL   MCH, POC 25.3 (A) 27 - 31.2 pg   MCHC 30.7 (A) 31.8 - 35.4 g/dL   RDW, POC 13.6 %   Platelet Count, POC 244 142 - 424 K/uL   MPV 8.5 0 - 99.8 fL  POCT glucose (manual entry)  Result Value Ref Range   POC Glucose 171 (A) 70 - 99 mg/dl  POCT glycosylated hemoglobin (Hb A1C)  Result Value Ref Range   Hemoglobin A1C 8.4        Assessment & Plan:   1. Essential hypertension Above goal. Await remaining labs. Will need medication adjustment. - POCT CBC - TSH  2. DM (diabetes mellitus), type 2, uncontrolled Not yet controlled. Increase Lantus to 16 units. Increase to 18 units if fasting glucose not below 150 in 1 week.  Contact me if fasting glucose remains above 150 on Lantus 18 u. - POCT glucose (manual entry) - POCT glycosylated hemoglobin (Hb A1C) - Comprehensive metabolic panel - Microalbumin, urine  3. Hyperlipidemia with target LDL less than 70 Await labs. - Lipid panel  4. Erectile dysfunction, unspecified erectile dysfunction type Await testosterone level. If normal, trial of Cialis or Levitra. - Testosterone  5. BMI 25.0-25.9,adult Healthy eating, regular exercise.  6. Need for influenza vaccination - Flu Vaccine QUAD 36+ mos IM  7. Tinea pedis of left foot OTC athlete's foot cream or powder. If ineffective, plan ketoconazole 2% cream.  8. Anemia 9. Screening for colon cancer He's not yet had a colonoscopy. Refer to GI for additional evaluation.  Return in about 3 months (around 11/04/2014) for diabetes follow-up.   [name], PA-C Physician Assistant-Certified [facility]

## 2014-08-04 NOTE — Addendum Note (Signed)
Addended by: [name] on: 08/04/2014 09:27 PM   Modules accepted: Orders

## 2014-08-04 NOTE — Patient Instructions (Addendum)
  1. Increase the Lantus to 16 units each day. 2. If in 1 week, your fasting blood sugars are above 150, increase the Lantus to 18 units. 3. If in one more week, your fasting blood sugars remain above 150, please contact me. 4. The gastroenterologist will contact you to schedule a visit and colonoscopy. 5. As soon as the Lantus arrives, we'll contact you to come pick it up!

## 2014-08-05 LAB — TESTOSTERONE: Testosterone: 320 ng/dL (ref 300–890)

## 2014-08-05 LAB — MICROALBUMIN, URINE: MICROALB UR: 86.1 mg/dL — AB (ref <2.0)

## 2014-08-09 ENCOUNTER — Encounter: Payer: Self-pay | Admitting: Physician Assistant

## 2014-08-11 NOTE — Progress Notes (Signed)
Has an appt with Chelle June 23rd

## 2014-09-06 ENCOUNTER — Encounter: Payer: Self-pay | Admitting: Physician Assistant

## 2014-09-20 ENCOUNTER — Ambulatory Visit (INDEPENDENT_AMBULATORY_CARE_PROVIDER_SITE_OTHER): Payer: BLUE CROSS/BLUE SHIELD | Admitting: Physician Assistant

## 2014-09-20 ENCOUNTER — Encounter: Payer: Self-pay | Admitting: Physician Assistant

## 2014-09-20 ENCOUNTER — Encounter: Payer: Self-pay | Admitting: Gastroenterology

## 2014-09-20 VITALS — BP 168/100 | HR 82 | Wt 176.8 lb

## 2014-09-20 DIAGNOSIS — Z1211 Encounter for screening for malignant neoplasm of colon: Secondary | ICD-10-CM

## 2014-09-20 DIAGNOSIS — D649 Anemia, unspecified: Secondary | ICD-10-CM | POA: Diagnosis not present

## 2014-09-20 MED ORDER — NA SULFATE-K SULFATE-MG SULF 17.5-3.13-1.6 GM/177ML PO SOLN: ORAL | Status: DC

## 2014-09-20 NOTE — Progress Notes (Signed)
Patient ID: Brandon Carroll, male   DOB: 1960-11-29, 54 y.o.   MRN: [id number]    HPI:  Brandon Carroll is a 54 y.o.   male referred by Brandon Chute, PA-C for evaluation of normocytic anemia.  Brandon Carroll denies any change in his bowel habits or stool caliber. He has not had any bloody or tarry stools, nor has he had any anorexia or unexplained weight loss. He denies complaints of epigastric pain, nausea, vomiting, dysphagia, odynophagia, or early satiety. He denies a family history of colon cancer, colon polyps, or inflammatory bowel disease. His past medical history is significant for hyperlipidemia, hypertension, diabetes.   Past Medical History  Diagnosis Date  . Diabetes mellitus   . Hypertension   . Erectile dysfunction   . Hyperlipidemia     Past Surgical History  Procedure Laterality Date  . Hernia repair  7th grade    umbilical  . Umbilical hernia repair  35 years ago   Family History  Problem Relation Age of Onset  . Arthritis Mother   . COPD Mother   . Diabetes Mother   . Hypertension Mother   . Diabetes Father   . Hypertension Father   . Arthritis Sister     rheumatoid  . COPD Sister   . Diabetes Brother    History  Substance Use Topics  . Smoking status: Never Smoker   . Smokeless tobacco: Never Used  . Alcohol Use: No   Current Outpatient Prescriptions  Medication Sig Dispense Refill  . carvedilol (COREG) 12.5 MG tablet Take 1 tablet (12.5 mg total) by mouth 2 (two) times daily with a meal. 60 tablet 5  . insulin glargine (LANTUS) 100 UNIT/ML injection Inject 0.1 mLs (10 Units total) into the skin every morning. 10 mL 12  . lisinopril-hydrochlorothiazide (PRINZIDE,ZESTORETIC) 20-25 MG per tablet Take 1 tablet by mouth daily. 30 tablet 5  . lovastatin (MEVACOR) 20 MG tablet Take 1 tablet (20 mg total) by mouth daily. 30 tablet 5  . metFORMIN (GLUCOPHAGE) 1000 MG tablet Take 1 tablet (1,000 mg total) by mouth 2 (two) times daily with a meal. 60  tablet 5  . sildenafil (VIAGRA) 100 MG tablet Take 1 tablet (100 mg total) by mouth daily as needed for erectile dysfunction. 10 tablet 5  . verapamil (CALAN) 120 MG tablet Take 1 tablet (120 mg total) by mouth 2 (two) times daily. 60 tablet 5   No current facility-administered medications for this visit.   No Known Allergies   Review of Systems: Gen: Denies any fever, chills, sweats, anorexia, fatigue, weakness, malaise, weight loss, and sleep disorder CV: Denies chest pain, angina, palpitations, syncope, orthopnea, PND, peripheral edema, and claudication. Resp: Denies dyspnea at rest, dyspnea with exercise, cough, sputum, wheezing, coughing up blood, and pleurisy. GI: Denies vomiting blood, jaundice, and fecal incontinence.   Denies dysphagia or odynophagia. GU : Denies urinary burning, blood in urine, urinary frequency, urinary hesitancy, nocturnal urination, and urinary incontinence. Reports problems with erectile dysfunction MS: Denies joint pain, limitation of movement, and swelling, stiffness, low back pain, extremity pain. Denies muscle weakness, cramps, atrophy.  Derm: Denies rash, itching, dry skin, hives, moles, warts, or unhealing ulcers.  Psych: Denies depression, anxiety, memory loss, suicidal ideation, hallucinations, paranoia, and confusion. Heme: Denies bruising, bleeding, and enlarged lymph nodes. Neuro:  Denies any headaches, dizziness, paresthesias. Endo:  Denies any problems with thyroid, adrenal function  LAB RESULTS: CBC on 08/04/2014 white blood cell 8.2, hemoglobin 12.6, hematocrit 40.9, MCV 82.3, CBC  on 08/20/2011 white blood cell 7.9, hemoglobin 13.4, hematocrit 41.4, MCV 77.3, platelets 268,000  Physical Exam: BP 170/100 mmHg  Pulse 82  Wt 176 lb 12.8 oz (80.196 kg) Constitutional: Pleasant,well-developed, AAmale in no acute distress. HEENT: Normocephalic and atraumatic. Conjunctivae are normal. No scleral icterus. Neck supple. No  thyromegaly Cardiovascular: Normal rate, regular rhythm.  Pulmonary/chest: Effort normal and breath sounds normal. No wheezing, rales or rhonchi. Abdominal: Soft, nondistended, nontender. Bowel sounds active throughout. There are no masses palpable. No hepatomegaly. Extremities: no edema Lymphadenopathy: No cervical adenopathy noted. Neurological: Alert and oriented to person place and time. Skin: Skin is warm and dry. No rashes noted. Psychiatric: Normal mood and affect. Behavior is normal.  ASSESSMENT AND PLAN: Asymptomatic 54 year old black male with no family history of colon cancer, found to have a normocytic anemia, referred for evaluation. Patient will complete stool cards for blood and returned him to the lab. He will be scheduled for colonoscopy to screen for polyps or neoplasia.The risks, benefits, and alternatives to colonoscopy with possible biopsy and possible polypectomy were discussed with the patient and they consent to proceed.  Further recommendations will be made pending the findings of his colonoscopy. The procedure will be scheduled with Dr. Deatra Ina.    Brandon Carroll, Brandon Barley PA-C 09/20/2014, 8:45 AM  CC: Brandon Chute, PA-C

## 2014-09-20 NOTE — Patient Instructions (Addendum)
You have been scheduled for a colonoscopy. Please follow written instructions given to you at your visit today.  Please pick up your prep supplies at the pharmacy within the next 1-3 days. If you use inhalers (even only as needed), please bring them with you on the day of your procedure.   Please contact your PCP to follow up on your blood pressure reading  Please follow instruction on how to preform hemoccult cards that were given to you today at office visit

## 2014-09-21 ENCOUNTER — Telehealth: Payer: Self-pay

## 2014-09-21 DIAGNOSIS — R7989 Other specified abnormal findings of blood chemistry: Secondary | ICD-10-CM

## 2014-09-21 NOTE — Telephone Encounter (Signed)
Pt says he has low testosterone and wanted to talk with Chelle about it

## 2014-09-21 NOTE — Progress Notes (Signed)
Reviewed and agree with management. Judia Arnott D. Alleyne Lac, M.D., FACG  

## 2014-09-22 NOTE — Telephone Encounter (Signed)
Called pt, he would like an app to see urology for low testosterone. Chelle mailed him a letter staing to call our office if he wanted to pursue this referral. Referral in.

## 2014-09-27 ENCOUNTER — Encounter: Payer: BLUE CROSS/BLUE SHIELD | Admitting: Gastroenterology

## 2014-11-04 ENCOUNTER — Ambulatory Visit (INDEPENDENT_AMBULATORY_CARE_PROVIDER_SITE_OTHER): Payer: BLUE CROSS/BLUE SHIELD | Admitting: Physician Assistant

## 2014-11-04 ENCOUNTER — Encounter: Payer: Self-pay | Admitting: Physician Assistant

## 2014-11-04 VITALS — BP 148/86 | HR 78 | Temp 98.1°F | Resp 16 | Ht 69.0 in | Wt 172.0 lb

## 2014-11-04 DIAGNOSIS — E291 Testicular hypofunction: Secondary | ICD-10-CM | POA: Diagnosis not present

## 2014-11-04 DIAGNOSIS — I10 Essential (primary) hypertension: Secondary | ICD-10-CM

## 2014-11-04 DIAGNOSIS — E1165 Type 2 diabetes mellitus with hyperglycemia: Secondary | ICD-10-CM

## 2014-11-04 DIAGNOSIS — Z114 Encounter for screening for human immunodeficiency virus [HIV]: Secondary | ICD-10-CM

## 2014-11-04 DIAGNOSIS — N529 Male erectile dysfunction, unspecified: Secondary | ICD-10-CM | POA: Diagnosis not present

## 2014-11-04 DIAGNOSIS — E119 Type 2 diabetes mellitus without complications: Secondary | ICD-10-CM | POA: Diagnosis not present

## 2014-11-04 DIAGNOSIS — R809 Proteinuria, unspecified: Secondary | ICD-10-CM | POA: Diagnosis not present

## 2014-11-04 DIAGNOSIS — E1129 Type 2 diabetes mellitus with other diabetic kidney complication: Secondary | ICD-10-CM

## 2014-11-04 DIAGNOSIS — E785 Hyperlipidemia, unspecified: Secondary | ICD-10-CM

## 2014-11-04 DIAGNOSIS — R7989 Other specified abnormal findings of blood chemistry: Secondary | ICD-10-CM

## 2014-11-04 DIAGNOSIS — IMO0002 Reserved for concepts with insufficient information to code with codable children: Secondary | ICD-10-CM

## 2014-11-04 LAB — COMPREHENSIVE METABOLIC PANEL
ALT: 17 U/L (ref 0–53)
AST: 20 U/L (ref 0–37)
Albumin: 3.6 g/dL (ref 3.5–5.2)
Alkaline Phosphatase: 106 U/L (ref 39–117)
BILIRUBIN TOTAL: 0.3 mg/dL (ref 0.2–1.2)
BUN: 18 mg/dL (ref 6–23)
CO2: 26 meq/L (ref 19–32)
Calcium: 9.5 mg/dL (ref 8.4–10.5)
Chloride: 104 mEq/L (ref 96–112)
Creat: 1.31 mg/dL (ref 0.50–1.35)
GLUCOSE: 168 mg/dL — AB (ref 70–99)
Potassium: 4.3 mEq/L (ref 3.5–5.3)
SODIUM: 143 meq/L (ref 135–145)
TOTAL PROTEIN: 7.1 g/dL (ref 6.0–8.3)

## 2014-11-04 LAB — LIPID PANEL
Cholesterol: 155 mg/dL (ref 0–200)
HDL: 49 mg/dL (ref >=40)
LDL CALC: 76 mg/dL (ref 0–99)
TRIGLYCERIDES: 150 mg/dL — AB (ref <150)
Total CHOL/HDL Ratio: 3.2 Ratio
VLDL: 30 mg/dL (ref 0–40)

## 2014-11-04 LAB — GLUCOSE, POCT (MANUAL RESULT ENTRY): POC Glucose: 178 mg/dl — AB (ref 70–99)

## 2014-11-04 LAB — POCT GLYCOSYLATED HEMOGLOBIN (HGB A1C): Hemoglobin A1C: 9

## 2014-11-04 MED ORDER — INSULIN GLARGINE 100 UNIT/ML ~~LOC~~ SOLN: 10.0000 [IU] | SUBCUTANEOUS | Status: DC

## 2014-11-04 MED ORDER — CANAGLIFLOZIN 100 MG PO TABS: 100.0000 mg | ORAL_TABLET | Freq: Every day | ORAL | Status: DC

## 2014-11-04 MED ORDER — LISINOPRIL 20 MG PO TABS: 20.0000 mg | ORAL_TABLET | Freq: Every day | ORAL | Status: DC

## 2014-11-04 NOTE — Patient Instructions (Addendum)
I will contact you with your lab results as soon as they are available.   If you have not heard from me in 2 weeks, please contact me.  The fastest way to get your results is to register for My Chart (see the instructions on the last page of this printout).  I'm adding lisinopril 20 mg each day to help reduce your blood pressure. Take the lisinopril-HCTZ each day when you get up. Take the additional lisinopril 20 mg before bed. If you have side effects, let me know.  I have added Invokana, because your sugar control has worsened since last time. Keep taking the metformin and using the Lantus. This new medication causes the sugar to come out in your urine, so urine tests will show that. It's extra important that you drink plenty of water.

## 2014-11-04 NOTE — Progress Notes (Signed)
Patient ID: Brandon Carroll, male    DOB: Oct 04, 1960, 54 y.o.   MRN: [id number]  PCP: Dawsen Krieger, PA-C  Subjective:   Chief Complaint  Patient presents with  . Diabetes    3 month check up  . Hyperlipidemia  . Hypertension    HPI Presents for evaluation of diabetes and HTN.  Feels well. He continues on lower carvedilol dose due to lightheadedness and HA on the full 12.5 mg BID dose. No other medication side effects.    Review of Systems  Constitutional: Negative for activity change, appetite change, fatigue and unexpected weight change.  HENT: Negative for congestion, dental problem, ear pain, hearing loss, mouth sores, postnasal drip, rhinorrhea, sneezing, sore throat, tinnitus and trouble swallowing.   Eyes: Negative for photophobia, pain, redness and visual disturbance.  Respiratory: Negative for cough, chest tightness and shortness of breath.   Cardiovascular: Negative for chest pain, palpitations and leg swelling.  Gastrointestinal: Negative for nausea, vomiting, abdominal pain, diarrhea, constipation and blood in stool.  Genitourinary: Negative for dysuria, urgency, frequency and hematuria.       He reports inability to get an erection. He has good sexual desire. Thinks he has low testosterone.  Musculoskeletal: Negative for myalgias, arthralgias, gait problem and neck stiffness.  Skin: Negative for rash.  Neurological: Negative for dizziness, speech difficulty, weakness, light-headedness, numbness and headaches.  Hematological: Negative for adenopathy.  Psychiatric/Behavioral: Negative for confusion and sleep disturbance. The patient is not nervous/anxious.        Patient Active Problem List   Diagnosis Date Noted  . BMI 25.0-25.9,adult 07/12/2014  . DM (diabetes mellitus), type 2, uncontrolled 09/18/2011  . HTN (hypertension) 09/18/2011  . ED (erectile dysfunction) 09/18/2011  . Hyperlipidemia with target LDL less than 70 09/18/2011     Prior to  Admission medications   Medication Sig Start Date End Date Taking? Authorizing Provider  carvedilol (COREG) 12.5 MG tablet Take 1 tablet (12.5 mg total) by mouth 2 (two) times daily with a meal. Patient taking differently: Take 12.5 mg by mouth 2 (two) times daily with a meal. Patient is taking 1/2 pill in the am and 1/2 pill in the pm 07/12/14  Yes Torrie Lafavor, PA-C  insulin glargine (LANTUS) 100 UNIT/ML injection Inject 0.1 mLs (10 Units total) into the skin every morning. 07/12/14  Yes Omega Slager, PA-C  lisinopril-hydrochlorothiazide (PRINZIDE,ZESTORETIC) 20-25 MG per tablet Take 1 tablet by mouth daily. 07/12/14  Yes Kaylamarie Swickard, PA-C  lovastatin (MEVACOR) 20 MG tablet Take 1 tablet (20 mg total) by mouth daily. 07/12/14  Yes Luciano Cinquemani, PA-C  metFORMIN (GLUCOPHAGE) 1000 MG tablet Take 1 tablet (1,000 mg total) by mouth 2 (two) times daily with a meal. 07/12/14  Yes Aubrianne Molyneux, PA-C  Na Sulfate-K Sulfate-Mg Sulf (SUPREP BOWEL PREP) SOLN Take 1 kit as directed for colonoscopy 09/20/14  Yes Lori P Hvozdovic, PA-C  sildenafil (VIAGRA) 100 MG tablet Take 1 tablet (100 mg total) by mouth daily as needed for erectile dysfunction. 07/12/14  Yes Armani Gawlik, PA-C  verapamil (CALAN) 120 MG tablet Take 1 tablet (120 mg total) by mouth 2 (two) times daily. 07/12/14  Yes Ludy Messamore, PA-C     No Known Allergies     Objective:  Physical Exam  Constitutional: He is oriented to person, place, and time. Vital signs are normal. He appears well-developed and well-nourished. He is active and cooperative. No distress.  BP 148/86 mmHg  Pulse 78  Temp(Src) 98.1 F (36.7 C) (Oral)  Resp 16  Ht 5' 9"  (1.753 m)  Wt 172 lb (78.019 kg)  BMI 25.39 kg/m2  SpO2 99%  HENT:  Head: Normocephalic and atraumatic.  Right Ear: Hearing normal.  Left Ear: Hearing normal.  Eyes: Conjunctivae are normal. No scleral icterus.  Neck: Normal range of motion. Neck supple. No thyromegaly present.    Cardiovascular: Normal rate, regular rhythm and normal heart sounds.   Pulses:      Radial pulses are 2+ on the right side, and 2+ on the left side.  Pulmonary/Chest: Effort normal and breath sounds normal.  Lymphadenopathy:       Head (right side): No tonsillar, no preauricular, no posterior auricular and no occipital adenopathy present.       Head (left side): No tonsillar, no preauricular, no posterior auricular and no occipital adenopathy present.    He has no cervical adenopathy.       Right: No supraclavicular adenopathy present.       Left: No supraclavicular adenopathy present.  Neurological: He is alert and oriented to person, place, and time. No sensory deficit.  Skin: Skin is warm, dry and intact. No rash noted. No cyanosis or erythema. Nails show no clubbing.  Psychiatric: He has a normal mood and affect.   Normal diabetic foot exam.  Results for orders placed or performed in visit on 11/04/14  POCT glucose (manual entry)  Result Value Ref Range   POC Glucose 178 (A) 70 - 99 mg/dl  POCT glycosylated hemoglobin (Hb A1C)  Result Value Ref Range   Hemoglobin A1C 9.0       Assessment & Plan:   1. DM (diabetes mellitus), type 2, uncontrolled, with renal complications Worsening. Continue metformin and Lantus insulin. Add Invokana 100 mg and recheck in 3 months. Healthy eating and regular exercise.  - HM Diabetes Foot Exam - POCT glucose (manual entry) - POCT glycosylated hemoglobin (Hb A1C) - Comprehensive metabolic panel - insulin glargine (LANTUS) 100 UNIT/ML injection; Inject 0.1 mLs (10 Units total) into the skin every morning.  Dispense: 10 mL; Refill: 12 - canagliflozin (INVOKANA) 100 MG TABS tablet; Take 1 tablet (100 mg total) by mouth daily.  Dispense: 90 tablet; Refill: 3 - lisinopril (PRINIVIL,ZESTRIL) 20 MG tablet; Take 1 tablet (20 mg total) by mouth daily.  Dispense: 90 tablet; Refill: 3  2. Microalbuminuria due to type 2 diabetes mellitus Add Lisinopril 20  mg at HS. - lisinopril (PRINIVIL,ZESTRIL) 20 MG tablet; Take 1 tablet (20 mg total) by mouth daily.  Dispense: 90 tablet; Refill: 3  3. Essential hypertension Add lisinopril 20 mg at HS. - lisinopril (PRINIVIL,ZESTRIL) 20 MG tablet; Take 1 tablet (20 mg total) by mouth daily.  Dispense: 90 tablet; Refill: 3  4. Hyperlipidemia with target LDL less than 70 Await lab results. - Lipid panel  5. Screening for HIV (human immunodeficiency virus) - HIV antibody  6. Erectile dysfunction, unspecified erectile dysfunction type Await lab results. Anticipate referral to urology. - Testosterone, Total & Free Direct   [name], PA-C Physician Assistant-Certified [facility] .

## 2014-11-05 LAB — HIV ANTIBODY (ROUTINE TESTING W REFLEX): HIV 1&2 Ab, 4th Generation: NONREACTIVE

## 2014-11-05 NOTE — Addendum Note (Signed)
Addended by: [name] on: 11/05/2014 08:29 AM   Modules accepted: Orders

## 2014-11-08 LAB — TESTOSTERONE, TOTAL AND FREE DIRECT MEASURE
Free Testosterone, Direct: 3.3 pg/mL — ABNORMAL LOW (ref 3.8–34.2)
TESTOSTERONE: 251 ng/dL — AB (ref 300–890)

## 2014-11-23 ENCOUNTER — Encounter: Payer: Self-pay | Admitting: Gastroenterology

## 2014-11-23 ENCOUNTER — Ambulatory Visit (AMBULATORY_SURGERY_CENTER): Payer: BLUE CROSS/BLUE SHIELD | Admitting: Gastroenterology

## 2014-11-23 VITALS — BP 172/102 | HR 69 | Temp 98.1°F | Resp 14 | Wt 176.0 lb

## 2014-11-23 DIAGNOSIS — Z1211 Encounter for screening for malignant neoplasm of colon: Secondary | ICD-10-CM | POA: Diagnosis not present

## 2014-11-23 DIAGNOSIS — D649 Anemia, unspecified: Secondary | ICD-10-CM

## 2014-11-23 MED ORDER — SODIUM CHLORIDE 0.9 % IV SOLN: 500.0000 mL | INTRAVENOUS | Status: DC

## 2014-11-23 MED ORDER — FLEET ENEMA 7-19 GM/118ML RE ENEM
1.0000 | ENEMA | Freq: Once | RECTAL | Status: AC
  Administered 2014-11-23: 1 via RECTAL

## 2014-11-23 NOTE — Patient Instructions (Signed)
YOU HAD AN ENDOSCOPIC PROCEDURE TODAY AT THE [facility]:   Refer to the procedure report that was given to you for any specific questions about what was found during the examination.  If the procedure report does not answer your questions, please call your gastroenterologist to clarify.  If you requested that your care partner not be given the details of your procedure findings, then the procedure report has been included in a sealed envelope for you to review at your convenience later.  YOU SHOULD EXPECT: Some feelings of bloating in the abdomen. Passage of more gas than usual.  Walking can help get rid of the air that was put into your GI tract during the procedure and reduce the bloating. If you had a lower endoscopy (such as a colonoscopy or flexible sigmoidoscopy) you may notice spotting of blood in your stool or on the toilet paper. If you underwent a bowel prep for your procedure, you may not have a normal bowel movement for a few days.  Please Note:  You might notice some irritation and congestion in your nose or some drainage.  This is from the oxygen used during your procedure.  There is no need for concern and it should clear up in a day or so.  SYMPTOMS TO REPORT IMMEDIATELY:   Following lower endoscopy (colonoscopy or flexible sigmoidoscopy):  Excessive amounts of blood in the stool  Significant tenderness or worsening of abdominal pains  Swelling of the abdomen that is new, acute  Fever of 100F or higher   For urgent or emergent issues, a gastroenterologist can be reached at any hour by calling [phone].   DIET: Your first meal following the procedure should be a small meal and then it is ok to progress to your normal diet. Heavy or fried foods are harder to digest and may make you feel nauseous or bloated.  Likewise, meals heavy in dairy and vegetables can increase bloating.  Drink plenty of fluids but you should avoid alcoholic beverages for 24  hours.  ACTIVITY:  You should plan to take it easy for the rest of today and you should NOT DRIVE or use heavy machinery until tomorrow (because of the sedation medicines used during the test).    FOLLOW UP: Our staff will call the number listed on your records the next business day following your procedure to check on you and address any questions or concerns that you may have regarding the information given to you following your procedure. If we do not reach you, we will leave a message.  However, if you are feeling well and you are not experiencing any problems, there is no need to return our call.  We will assume that you have returned to your regular daily activities without incident.  If any biopsies were taken you will be contacted by phone or by letter within the next 1-3 weeks.  Please call us at [phone] if you have not heard about the biopsies in 3 weeks.    SIGNATURES/CONFIDENTIALITY: You and/or your care partner have signed paperwork which will be entered into your electronic medical record.  These signatures attest to the fact that that the information above on your After Visit Summary has been reviewed and is understood.  Full responsibility of the confidentiality of this discharge information lies with you and/or your care-partner. 

## 2014-11-23 NOTE — Progress Notes (Signed)
To recovery, report to [place], RN, VSS 

## 2014-11-23 NOTE — Progress Notes (Signed)
Pt noted that he was not clear after prep, he was cloudy brown, advised Dr Deatra Ina, received orders to give enema, pt was instructed on enema, results were cloudy brown with particles of stool, called results to Dr Deatra Ina, he ok'd to go ahead with procedure-adm

## 2014-11-23 NOTE — Op Note (Signed)
[place]  [address]. [place], [id number]   COLONOSCOPY PROCEDURE REPORT  PATIENT: Brandon Carroll, Brandon Carroll  MR#: [id number] BIRTHDATE: 04-23-1961 , 53  yrs. old GENDER: male ENDOSCOPIST: Inda Castle, MD REFERRED BY: PROCEDURE DATE:  11/23/2014 PROCEDURE:   Colonoscopy, screening First Screening Colonoscopy - Avg.  risk and is 50 yrs.  old or older Yes.  Prior Negative Screening - Now for repeat screening. N/A  History of Adenoma - Now for follow-up colonoscopy & has been > or = to 3 yrs.  N/A  Polyps removed today? No Recommend repeat exam, <10 yrs? No ASA CLASS:   Class II INDICATIONS:Colorectal Neoplasm Risk Assessment for this procedure is average risk. MEDICATIONS: Monitored anesthesia care and Propofol 230 mg IV  DESCRIPTION OF PROCEDURE:   After the risks benefits and alternatives of the procedure were thoroughly explained, informed consent was obtained.  The digital rectal exam revealed no abnormalities of the rectum.   The LB [id number]  endoscope was introduced through the anus and advanced to the cecum, which was identified by both the appendix and ileocecal valve. No adverse events experienced.   The quality of the prep was (Suprep was used) fair.  The instrument was then slowly withdrawn as the colon was fully examined. Estimated blood loss is zero unless otherwise noted in this procedure report.      COLON FINDINGS: A normal appearing cecum, ileocecal valve, and appendiceal orifice were identified.  The ascending, transverse, descending, sigmoid colon, and rectum appeared unremarkable. Retroflexed views revealed no abnormalities. The time to cecum = 7:58 Withdrawal time =      The scope was withdrawn and the procedure completed. COMPLICATIONS: There were no immediate complications.  ENDOSCOPIC IMPRESSION: Normal colonoscopy  RECOMMENDATIONS: Continue current colorectal screening recommendations for "routine risk" patients with a  repeat colonoscopy in 10 years.  eSigned:  Inda Castle, MD 11/23/2014 3:22 PM   cc: Harrison Mons, PA

## 2014-11-24 ENCOUNTER — Telehealth: Payer: Self-pay | Admitting: *Deleted

## 2014-11-24 NOTE — Telephone Encounter (Signed)
  Follow up Call-  No answer

## 2014-12-01 ENCOUNTER — Encounter: Payer: Self-pay | Admitting: Endocrinology

## 2014-12-01 ENCOUNTER — Ambulatory Visit (INDEPENDENT_AMBULATORY_CARE_PROVIDER_SITE_OTHER): Payer: BLUE CROSS/BLUE SHIELD | Admitting: Endocrinology

## 2014-12-01 VITALS — BP 132/88 | HR 75 | Temp 98.2°F | Resp 16 | Ht 70.0 in | Wt 169.0 lb

## 2014-12-01 DIAGNOSIS — E1121 Type 2 diabetes mellitus with diabetic nephropathy: Secondary | ICD-10-CM

## 2014-12-01 MED ORDER — INSULIN LISPRO 100 UNIT/ML (KWIKPEN): 3.0000 [IU] | PEN_INJECTOR | Freq: Three times a day (TID) | SUBCUTANEOUS | Status: DC

## 2014-12-01 NOTE — Patient Instructions (Addendum)
good diet and exercise significantly improve the control of your diabetes.  please let me know if you wish to be referred to a dietician.  high blood sugar is very risky to your health.  you should see an eye doctor and dentist every year.  It is very important to get all recommended vaccinations.  controlling your blood pressure and cholesterol drastically reduces the damage diabetes does to your body.  Those who smoke should quit.  please discuss these with your doctor.  check your blood sugar 4 times a day.  vary the time of day when you check, between before the 3 meals, and at bedtime.  also check if you have symptoms of your blood sugar being too high or too low.  please keep a record of the readings and bring it to your next appointment here.  You can write it on any piece of paper.  please call us sooner if your blood sugar goes below 70, or if you have a lot of readings over 200. At our office, we are fortunate to have two specialists who are happy to help you:   Leonia Reader, RN, CDE, is a diabetes educator and pump trainer.  She is here on Monday mornings, and all day Tuesday and Wednesday.  She is can help you with low blood sugar avoidance and treatment, injecting insulin, sick day management, and others.   Antonieta Iba, RD is our dietician.  She is here all day Thursday and Friday.  She can advise you about a healthy diet.  She can also help you about a variety of special diabetes situations, such as shift work, Actor, gluten-free, diet for kidney patients, traveling with diabetes, and help for those who need to gain weight.   For now: Please continue the same lantus, and: Please stop taking the metformin and invokana, and: Start humalog, 3 units 3 times a day (just before each meal).   Please come back for a follow-up appointment in 1 month.

## 2014-12-01 NOTE — Progress Notes (Signed)
Subjective:    Patient ID: Brandon Carroll, male    DOB: 12/19/1960, 54 y.o.   MRN: [id number]  HPI pt states DM was dx'ed in 1996; he has mild if any neuropathy of the lower extremities, but he has associated nephropathy; he has been on insulin since 2014; pt says his diet is not good, but exercise is excellent; he has never had pancreatitis, severe hypoglycemia or DKA.  He works 3rd shift, as a Presenter, broadcasting.  He seldom checks cbg's.  He has never taken more than 10 units of insulin qd.   Past Medical History  Diagnosis Date  . Diabetes mellitus   . Hypertension   . Erectile dysfunction   . Hyperlipidemia     Past Surgical History  Procedure Laterality Date  . Hernia repair  7th grade    umbilical  . Umbilical hernia repair  35 years ago    History   Social History  . Marital Status: Divorced    Spouse Name: n/a  . Number of Children: 1  . Years of Education: 12+   Occupational History  . Animal nutritionist    Social History Main Topics  . Smoking status: Never Smoker   . Smokeless tobacco: Never Used  . Alcohol Use: No  . Drug Use: No  . Sexual Activity: Not Currently     Comment: Erectile dysfunciotn   Other Topics Concern  . Not on file   Social History Narrative   Divorced. Education: [employer]. Exercise: walking 5 days a week for 45 minutes   Lives alone.   Son lives in [place].    Current Outpatient Prescriptions on File Prior to Visit  Medication Sig Dispense Refill  . carvedilol (COREG) 12.5 MG tablet Take 1 tablet (12.5 mg total) by mouth 2 (two) times daily with a meal. (Patient taking differently: Take 12.5 mg by mouth 2 (two) times daily with a meal. Patient is taking 1/2 pill in the am and 1/2 pill in the pm) 60 tablet 5  . insulin glargine (LANTUS) 100 UNIT/ML injection Inject 0.1 mLs (10 Units total) into the skin every morning. 10 mL 12  . lisinopril-hydrochlorothiazide (PRINZIDE,ZESTORETIC) 20-25 MG per tablet Take 1 tablet by mouth daily. 30  tablet 5  . lovastatin (MEVACOR) 20 MG tablet Take 1 tablet (20 mg total) by mouth daily. 30 tablet 5  . verapamil (CALAN) 120 MG tablet Take 1 tablet (120 mg total) by mouth 2 (two) times daily. 60 tablet 5  . sildenafil (VIAGRA) 100 MG tablet Take 1 tablet (100 mg total) by mouth daily as needed for erectile dysfunction. (Patient not taking: Reported on 12/01/2014) 10 tablet 5   No current facility-administered medications on file prior to visit.    No Known Allergies  Family History  Problem Relation Age of Onset  . Arthritis Mother   . COPD Mother   . Diabetes Mother     was thin, took insulin  . Hypertension Mother   . Hypertension Father   . Arthritis Sister     rheumatoid  . COPD Sister   . Diabetes Brother   . Colon cancer Neg Hx     BP 132/88 mmHg  Pulse 75  Temp(Src) 98.2 F (36.8 C) (Oral)  Resp 16  Ht 5\' 10"  (1.778 m)  Wt 169 lb (76.658 kg)  BMI 24.25 kg/m2  SpO2 98%  Review of Systems denies blurry vision, headache, chest pain, sob, n/v, urinary frequency, muscle cramps, excessive diaphoresis, depression, cold intolerance, rhinorrhea,  and easy bruising.  He has lost weight.     Objective:   Physical Exam VS: see vs page GEN: no distress HEAD: head: no deformity eyes: no periorbital swelling, no proptosis external nose and ears are normal mouth: no lesion seen NECK: supple, thyroid is not enlarged CHEST WALL: no deformity LUNGS: clear to auscultation BREASTS:  No gynecomastia CV: reg rate and rhythm, no murmur ABD: abdomen is soft, nontender.  no hepatosplenomegaly.  not distended.  no hernia MUSCULOSKELETAL: muscle bulk and strength are grossly normal.  no obvious joint swelling.  gait is normal and steady EXTEMITIES: no deformity.  no ulcer on the feet.  feet are of normal color and temp.  no edema.  There is bilateral onychomycosis of the toenails PULSES: dorsalis pedis intact bilat.  no carotid bruit NEURO:  cn 2-12 grossly intact.   readily moves  all 4's.  sensation is intact to touch on the feet SKIN:  Normal texture and temperature.  No rash or suspicious lesion is visible.   NODES:  None palpable at the neck PSYCH: alert, well-oriented.  Does not appear anxious nor depressed.   Lab Results  Component Value Date   HGBA1C 9.0 11/04/2014    i have reviewed outside records: he was noted to have elevated a1c despite several oral meds, and referred here.      Assessment & Plan:  DM: new to me: he is probably evolving type 1.   Patient is advised the following: Patient Instructions  good diet and exercise significantly improve the control of your diabetes.  please let me know if you wish to be referred to a dietician.  high blood sugar is very risky to your health.  you should see an eye doctor and dentist every year.  It is very important to get all recommended vaccinations.  controlling your blood pressure and cholesterol drastically reduces the damage diabetes does to your body.  Those who smoke should quit.  please discuss these with your doctor.  check your blood sugar 4 times a day.  vary the time of day when you check, between before the 3 meals, and at bedtime.  also check if you have symptoms of your blood sugar being too high or too low.  please keep a record of the readings and bring it to your next appointment here.  You can write it on any piece of paper.  please call us sooner if your blood sugar goes below 70, or if you have a lot of readings over 200. At our office, we are fortunate to have two specialists who are happy to help you:   Leonia Reader, RN, CDE, is a diabetes educator and pump trainer.  She is here on Monday mornings, and all day Tuesday and Wednesday.  She is can help you with low blood sugar avoidance and treatment, injecting insulin, sick day management, and others.   Antonieta Iba, RD is our dietician.  She is here all day Thursday and Friday.  She can advise you about a healthy diet.  She can also help you  about a variety of special diabetes situations, such as shift work, Actor, gluten-free, diet for kidney patients, traveling with diabetes, and help for those who need to gain weight.   For now: Please continue the same lantus, and: Please stop taking the metformin and invokana, and: Start humalog, 3 units 3 times a day (just before each meal).   Please come back for a follow-up appointment in 1  month.

## 2015-02-03 ENCOUNTER — Ambulatory Visit: Payer: BLUE CROSS/BLUE SHIELD | Admitting: Physician Assistant

## 2015-04-27 ENCOUNTER — Ambulatory Visit (INDEPENDENT_AMBULATORY_CARE_PROVIDER_SITE_OTHER): Payer: Self-pay | Admitting: Family Medicine

## 2015-04-27 VITALS — BP 174/102 | HR 94 | Temp 97.9°F | Resp 16 | Ht 70.0 in | Wt 174.0 lb

## 2015-04-27 DIAGNOSIS — E1122 Type 2 diabetes mellitus with diabetic chronic kidney disease: Secondary | ICD-10-CM

## 2015-04-27 DIAGNOSIS — J4520 Mild intermittent asthma, uncomplicated: Secondary | ICD-10-CM

## 2015-04-27 DIAGNOSIS — I10 Essential (primary) hypertension: Secondary | ICD-10-CM

## 2015-04-27 DIAGNOSIS — J683 Other acute and subacute respiratory conditions due to chemicals, gases, fumes and vapors: Secondary | ICD-10-CM

## 2015-04-27 DIAGNOSIS — E1165 Type 2 diabetes mellitus with hyperglycemia: Secondary | ICD-10-CM

## 2015-04-27 MED ORDER — ALBUTEROL SULFATE 108 (90 BASE) MCG/ACT IN AEPB: 2.0000 | INHALATION_SPRAY | Freq: Four times a day (QID) | RESPIRATORY_TRACT | Status: DC | PRN

## 2015-04-27 MED ORDER — AZITHROMYCIN 250 MG PO TABS: ORAL_TABLET | ORAL | Status: DC

## 2015-04-27 NOTE — Patient Instructions (Addendum)
Please return if you are worsening or not improving over next 48 hours.  Please return in one week to see Brandon Carroll about the blood pressure

## 2015-04-27 NOTE — Progress Notes (Signed)
Subjective:  This chart was scribed for Robyn Haber MD, by Tamsen Roers, at [facility].  This patient was seen in room  10 and the patient's care was started at 11:03 AM.   Chief Complaint  Patient presents with  . Cough    Symptoms x 1 week   . Nasal Congestion  . Shortness of Breath     Patient ID: Brandon Carroll, male    DOB: 05-Apr-1961, 54 y.o.   MRN: [id number]  HPI  HPI Comments: Brandon Carroll is a 54 y.o. male who presents to the [facility] complaining of shortness of breath, a cough as well as congestion onset a week ago.  He threw up about a week ago and states that the cold followed right after.   He states that he could not walk up three flights of stairs without difficulty breathing from the shortness of breath.   He has associated symptoms of wheezing mainly at night and states that he feels like his symptoms are not changing/getting better.  He denies a fever/chills. He has no history of asthma. Patients step daughter (3 year old) who works at the school is also sick at home.  He does not smoke.   He takes blood pressure medication (has had it for the past 20 years) and controls his diabetes with Metformin.  He does not take Lantis any longer. He also takes Lisinopril and Verapamil , Carvedilol, and Lipitor.  Patient does not smoke.   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   Blood pressure right arm: 150/100.     Patient Active Problem List   Diagnosis Date Noted  . Diabetes ([employer]) 12/02/2014  . BMI 25.0-25.9,adult 07/12/2014  . DM (diabetes mellitus), type 2, uncontrolled ([place]) 09/18/2011  . HTN (hypertension) 09/18/2011  . ED (erectile dysfunction) 09/18/2011  . Hyperlipidemia with target LDL less than 70 09/18/2011   Past Medical History  Diagnosis Date  . Diabetes mellitus   . Hypertension   . Erectile dysfunction   . Hyperlipidemia    Past Surgical History  Procedure Laterality  Date  . Hernia repair  7th grade    umbilical  . Umbilical hernia repair  35 years ago   No Known Allergies Prior to Admission medications   Medication Sig Start Date End Date Taking? Authorizing Provider  carvedilol (COREG) 12.5 MG tablet Take 1 tablet (12.5 mg total) by mouth 2 (two) times daily with a meal. Patient taking differently: Take 12.5 mg by mouth 2 (two) times daily with a meal. Patient is taking 1/2 pill in the am and 1/2 pill in the pm 07/12/14  Yes Chelle Jeffery, PA-C  insulin glargine (LANTUS) 100 UNIT/ML injection Inject 0.1 mLs (10 Units total) into the skin every morning. 11/04/14  Yes Chelle Jeffery, PA-C  insulin lispro (HUMALOG KWIKPEN) 100 UNIT/ML KiwkPen Inject 0.03 mLs (3 Units total) into the skin 3 (three) times daily with meals. And pen needles 4/day 12/01/14  Yes Renato Shin, MD  lisinopril-hydrochlorothiazide (PRINZIDE,ZESTORETIC) 20-25 MG per tablet Take 1 tablet by mouth daily. 07/12/14  Yes Chelle Jeffery, PA-C  lovastatin (MEVACOR) 20 MG tablet Take 1 tablet (20 mg total) by mouth daily. 07/12/14  Yes Chelle Jeffery, PA-C  verapamil (CALAN) 120 MG tablet Take 1 tablet (120 mg total) by mouth 2 (two) times daily. 07/12/14  Yes Chelle Jeffery, PA-C  sildenafil (VIAGRA) 100 MG tablet Take 1 tablet (100 mg total) by mouth  daily as needed for erectile dysfunction. Patient not taking: Reported on 12/01/2014 07/12/14   Harrison Mons, PA-C   Social History   Social History  . Marital Status: Divorced    Spouse Name: n/a  . Number of Children: 1  . Years of Education: 12+   Occupational History  . Animal nutritionist    Social History Main Topics  . Smoking status: Never Smoker   . Smokeless tobacco: Never Used  . Alcohol Use: No  . Drug Use: No  . Sexual Activity: Not Currently     Comment: Erectile dysfunciotn   Other Topics Concern  . Not on file   Social History Narrative   Divorced. Education: [employer]. Exercise: walking 5 days a week for 45 minutes    Lives alone.   Son lives in [place].        Review of Systems  Constitutional: Negative for fever and chills.  HENT: Positive for congestion.   Eyes: Negative for pain, redness and itching.  Respiratory: Positive for cough, shortness of breath and wheezing.   Gastrointestinal: Negative for nausea and vomiting.  Musculoskeletal: Negative for neck pain and neck stiffness.       Objective:   Physical Exam  Filed Vitals:   04/27/15 1032  BP: 174/102  Pulse: 94  Temp: 97.9 F (36.6 C)  TempSrc: Oral  Resp: 16  Height: 5\' 10"  (1.778 m)  Weight: 174 lb (78.926 kg)  SpO2: 96%    CONSTITUTIONAL: Well developed/well nourished HEAD: Normocephalic/atraumatic EYES: EOMI/PERRL ENMT: Mucous membranes moist NECK: supple no meningeal signs SPINE/BACK:entire spine nontender CV: S1/S2 noted, no murmurs/rubs/gallops noted LUNGS: few faint wheezes.  NEURO: Pt is awake/alert/appropriate, moves all extremitiesx4.  No facial droop.   EXTREMITIES: pulses normal/equal, full ROM SKIN: warm, color normal PSYCH: no abnormalities of mood noted, alert and oriented to situation       Assessment & Plan:   This chart was scribed in my presence and reviewed by me personally.    ICD-9-CM ICD-10-CM   1. Reactive airways dysfunction syndrome, mild intermittent, uncomplicated 123456 [id number] Albuterol Sulfate (PROAIR RESPICLICK) 123XX123 (90 BASE) MCG/ACT AEPB     azithromycin (ZITHROMAX) 250 MG tablet  2. Uncontrolled type 2 diabetes mellitus with chronic kidney disease, without long-term current use of insulin, unspecified CKD stage (HCC) 250.42 E11.22    585.9 E11.65   3. Essential hypertension 401.9 I10      Signed, Robyn Haber, MD

## 2015-05-01 ENCOUNTER — Other Ambulatory Visit: Payer: Self-pay | Admitting: Physician Assistant

## 2015-05-02 ENCOUNTER — Other Ambulatory Visit: Payer: Self-pay

## 2015-05-02 ENCOUNTER — Emergency Department (HOSPITAL_BASED_OUTPATIENT_CLINIC_OR_DEPARTMENT_OTHER): Payer: Self-pay

## 2015-05-02 ENCOUNTER — Encounter (HOSPITAL_BASED_OUTPATIENT_CLINIC_OR_DEPARTMENT_OTHER): Payer: Self-pay | Admitting: *Deleted

## 2015-05-02 ENCOUNTER — Inpatient Hospital Stay (HOSPITAL_BASED_OUTPATIENT_CLINIC_OR_DEPARTMENT_OTHER)
Admission: EM | Admit: 2015-05-02 | Discharge: 2015-05-05 | DRG: 291 | Disposition: A | Discharge status: Home / Self Care | Payer: Self-pay | Attending: Internal Medicine | Admitting: Internal Medicine

## 2015-05-02 DIAGNOSIS — E1122 Type 2 diabetes mellitus with diabetic chronic kidney disease: Secondary | ICD-10-CM | POA: Diagnosis present

## 2015-05-02 DIAGNOSIS — E1129 Type 2 diabetes mellitus with other diabetic kidney complication: Secondary | ICD-10-CM | POA: Diagnosis present

## 2015-05-02 DIAGNOSIS — D509 Iron deficiency anemia, unspecified: Secondary | ICD-10-CM | POA: Diagnosis present

## 2015-05-02 DIAGNOSIS — E785 Hyperlipidemia, unspecified: Secondary | ICD-10-CM | POA: Diagnosis present

## 2015-05-02 DIAGNOSIS — I130 Hypertensive heart and chronic kidney disease with heart failure and stage 1 through stage 4 chronic kidney disease, or unspecified chronic kidney disease: Secondary | ICD-10-CM | POA: Diagnosis present

## 2015-05-02 DIAGNOSIS — E1165 Type 2 diabetes mellitus with hyperglycemia: Secondary | ICD-10-CM | POA: Diagnosis present

## 2015-05-02 DIAGNOSIS — I5021 Acute systolic (congestive) heart failure: Principal | ICD-10-CM | POA: Diagnosis present

## 2015-05-02 DIAGNOSIS — E1121 Type 2 diabetes mellitus with diabetic nephropathy: Secondary | ICD-10-CM | POA: Diagnosis present

## 2015-05-02 DIAGNOSIS — I509 Heart failure, unspecified: Secondary | ICD-10-CM | POA: Insufficient documentation

## 2015-05-02 DIAGNOSIS — N182 Chronic kidney disease, stage 2 (mild): Secondary | ICD-10-CM | POA: Diagnosis present

## 2015-05-02 DIAGNOSIS — I10 Essential (primary) hypertension: Secondary | ICD-10-CM

## 2015-05-02 DIAGNOSIS — Z833 Family history of diabetes mellitus: Secondary | ICD-10-CM

## 2015-05-02 DIAGNOSIS — N189 Chronic kidney disease, unspecified: Secondary | ICD-10-CM

## 2015-05-02 DIAGNOSIS — R0600 Dyspnea, unspecified: Secondary | ICD-10-CM | POA: Insufficient documentation

## 2015-05-02 DIAGNOSIS — E1169 Type 2 diabetes mellitus with other specified complication: Secondary | ICD-10-CM | POA: Diagnosis present

## 2015-05-02 DIAGNOSIS — Z8249 Family history of ischemic heart disease and other diseases of the circulatory system: Secondary | ICD-10-CM

## 2015-05-02 DIAGNOSIS — N179 Acute kidney failure, unspecified: Secondary | ICD-10-CM | POA: Diagnosis present

## 2015-05-02 DIAGNOSIS — J189 Pneumonia, unspecified organism: Secondary | ICD-10-CM | POA: Diagnosis present

## 2015-05-02 DIAGNOSIS — N529 Male erectile dysfunction, unspecified: Secondary | ICD-10-CM | POA: Diagnosis present

## 2015-05-02 DIAGNOSIS — D72829 Elevated white blood cell count, unspecified: Secondary | ICD-10-CM | POA: Diagnosis not present

## 2015-05-02 DIAGNOSIS — Z825 Family history of asthma and other chronic lower respiratory diseases: Secondary | ICD-10-CM

## 2015-05-02 DIAGNOSIS — J181 Lobar pneumonia, unspecified organism: Secondary | ICD-10-CM

## 2015-05-02 LAB — BASIC METABOLIC PANEL
Anion gap: 6 (ref 5–15)
BUN: 15 mg/dL (ref 6–20)
CHLORIDE: 110 mmol/L (ref 101–111)
CO2: 21 mmol/L — ABNORMAL LOW (ref 22–32)
Calcium: 8.5 mg/dL — ABNORMAL LOW (ref 8.9–10.3)
Creatinine, Ser: 1.42 mg/dL — ABNORMAL HIGH (ref 0.61–1.24)
GFR calc Af Amer: >60 mL/min (ref >60)
GFR calc non Af Amer: 56 mL/min — ABNORMAL LOW (ref >60)
GLUCOSE: 169 mg/dL — AB (ref 65–99)
POTASSIUM: 4.3 mmol/L (ref 3.5–5.1)
Sodium: 137 mmol/L (ref 135–145)

## 2015-05-02 LAB — CBC WITH DIFFERENTIAL/PLATELET
Basophils Absolute: 0.1 10*3/uL (ref 0.0–0.1)
Basophils Relative: 1 %
EOS PCT: 3 %
Eosinophils Absolute: 0.3 10*3/uL (ref 0.0–0.7)
HCT: 35.4 % — ABNORMAL LOW (ref 39.0–52.0)
HEMOGLOBIN: 11.9 g/dL — AB (ref 13.0–17.0)
LYMPHS ABS: 0.9 10*3/uL (ref 0.7–4.0)
LYMPHS PCT: 10 %
MCH: 25.4 pg — AB (ref 26.0–34.0)
MCHC: 33.6 g/dL (ref 30.0–36.0)
MCV: 75.5 fL — AB (ref 78.0–100.0)
Monocytes Absolute: 0.8 10*3/uL (ref 0.1–1.0)
Monocytes Relative: 10 %
Neutro Abs: 6.6 10*3/uL (ref 1.7–7.7)
Neutrophils Relative %: 77 %
PLATELETS: 289 10*3/uL (ref 150–400)
RBC: 4.69 MIL/uL (ref 4.22–5.81)
RDW: 13.7 % (ref 11.5–15.5)
WBC: 8.7 10*3/uL (ref 4.0–10.5)

## 2015-05-02 LAB — CBG MONITORING, ED: GLUCOSE-CAPILLARY: 172 mg/dL — AB (ref 65–99)

## 2015-05-02 LAB — URINE MICROSCOPIC-ADD ON

## 2015-05-02 LAB — GLUCOSE, CAPILLARY
GLUCOSE-CAPILLARY: 195 mg/dL — AB (ref 65–99)
Glucose-Capillary: 283 mg/dL — ABNORMAL HIGH (ref 65–99)

## 2015-05-02 LAB — URINALYSIS, ROUTINE W REFLEX MICROSCOPIC
Bilirubin Urine: NEGATIVE
Glucose, UA: 100 mg/dL — AB
Ketones, ur: NEGATIVE mg/dL
LEUKOCYTES UA: NEGATIVE
Nitrite: NEGATIVE
SPECIFIC GRAVITY, URINE: 1.012 (ref 1.005–1.030)
pH: 5 (ref 5.0–8.0)

## 2015-05-02 LAB — TROPONIN I
Troponin I: 0.04 ng/mL — ABNORMAL HIGH (ref <0.031)
Troponin I: 0.03 ng/mL (ref <0.031)

## 2015-05-02 LAB — BRAIN NATRIURETIC PEPTIDE: B Natriuretic Peptide: 970.6 pg/mL — ABNORMAL HIGH (ref 0.0–100.0)

## 2015-05-02 MED ORDER — PRAVASTATIN SODIUM 20 MG PO TABS
20.0000 mg | ORAL_TABLET | Freq: Every day | ORAL | Status: DC
  Administered 2015-05-03 – 2015-05-04 (×2): 20 mg via ORAL
  Filled 2015-05-02 (×3): qty 1

## 2015-05-02 MED ORDER — ALBUTEROL SULFATE (2.5 MG/3ML) 0.083% IN NEBU: 2.5000 mg | INHALATION_SOLUTION | RESPIRATORY_TRACT | Status: DC | PRN

## 2015-05-02 MED ORDER — HYDRALAZINE HCL 20 MG/ML IJ SOLN: 10.0000 mg | Freq: Four times a day (QID) | INTRAMUSCULAR | Status: DC | PRN

## 2015-05-02 MED ORDER — CARVEDILOL 6.25 MG PO TABS
6.2500 mg | ORAL_TABLET | Freq: Two times a day (BID) | ORAL | Status: DC
  Administered 2015-05-02 – 2015-05-05 (×6): 6.25 mg via ORAL
  Filled 2015-05-02 (×6): qty 1

## 2015-05-02 MED ORDER — ACETAMINOPHEN 325 MG PO TABS: 650.0000 mg | ORAL_TABLET | Freq: Four times a day (QID) | ORAL | Status: DC | PRN

## 2015-05-02 MED ORDER — INSULIN ASPART 100 UNIT/ML ~~LOC~~ SOLN
0.0000 [IU] | Freq: Three times a day (TID) | SUBCUTANEOUS | Status: DC
  Administered 2015-05-03 (×2): 2 [IU] via SUBCUTANEOUS
  Administered 2015-05-03 – 2015-05-04 (×2): 1 [IU] via SUBCUTANEOUS
  Administered 2015-05-04 (×2): 2 [IU] via SUBCUTANEOUS
  Administered 2015-05-05: 1 [IU] via SUBCUTANEOUS

## 2015-05-02 MED ORDER — FUROSEMIDE 10 MG/ML IJ SOLN
40.0000 mg | Freq: Two times a day (BID) | INTRAMUSCULAR | Status: DC
  Administered 2015-05-02 – 2015-05-03 (×2): 40 mg via INTRAVENOUS
  Filled 2015-05-02 (×2): qty 4

## 2015-05-02 MED ORDER — AMLODIPINE BESYLATE 10 MG PO TABS
10.0000 mg | ORAL_TABLET | Freq: Every day | ORAL | Status: DC
  Administered 2015-05-02 – 2015-05-05 (×4): 10 mg via ORAL
  Filled 2015-05-02 (×5): qty 1

## 2015-05-02 MED ORDER — ONDANSETRON HCL 4 MG/2ML IJ SOLN: 4.0000 mg | Freq: Three times a day (TID) | INTRAMUSCULAR | Status: DC | PRN

## 2015-05-02 MED ORDER — ENOXAPARIN SODIUM 40 MG/0.4ML ~~LOC~~ SOLN
40.0000 mg | SUBCUTANEOUS | Status: DC
  Administered 2015-05-02: 40 mg via SUBCUTANEOUS
  Filled 2015-05-02 (×3): qty 0.4

## 2015-05-02 MED ORDER — PNEUMOCOCCAL VAC POLYVALENT 25 MCG/0.5ML IJ INJ
0.5000 mL | INJECTION | INTRAMUSCULAR | Status: AC
  Administered 2015-05-03: 0.5 mL via INTRAMUSCULAR
  Filled 2015-05-02 (×2): qty 0.5

## 2015-05-02 MED ORDER — VERAPAMIL HCL 120 MG PO TABS
120.0000 mg | ORAL_TABLET | Freq: Two times a day (BID) | ORAL | Status: DC
  Administered 2015-05-02 – 2015-05-05 (×6): 120 mg via ORAL
  Filled 2015-05-02 (×7): qty 1

## 2015-05-02 MED ORDER — ACETAMINOPHEN 650 MG RE SUPP: 650.0000 mg | Freq: Four times a day (QID) | RECTAL | Status: DC | PRN

## 2015-05-02 MED ORDER — PREDNISONE 50 MG PO TABS
60.0000 mg | ORAL_TABLET | Freq: Once | ORAL | Status: AC
  Administered 2015-05-02: 60 mg via ORAL
  Filled 2015-05-02: qty 1

## 2015-05-02 MED ORDER — IPRATROPIUM-ALBUTEROL 0.5-2.5 (3) MG/3ML IN SOLN
3.0000 mL | Freq: Four times a day (QID) | RESPIRATORY_TRACT | Status: DC
  Administered 2015-05-02: 3 mL via RESPIRATORY_TRACT
  Filled 2015-05-02: qty 3

## 2015-05-02 MED ORDER — ASPIRIN 325 MG PO TABS
325.0000 mg | ORAL_TABLET | Freq: Every day | ORAL | Status: DC
  Administered 2015-05-02 – 2015-05-05 (×4): 325 mg via ORAL
  Filled 2015-05-02 (×4): qty 1

## 2015-05-02 MED ORDER — FUROSEMIDE 10 MG/ML IJ SOLN
40.0000 mg | INTRAMUSCULAR | Status: AC
  Administered 2015-05-02: 40 mg via INTRAVENOUS
  Filled 2015-05-02: qty 4

## 2015-05-02 NOTE — H&P (Signed)
[facility] History and Physical  Demari Sins [id number] DOB: 1961/02/05 DOA: 05/02/2015  Referring physician:EDP PCP: JEFFERY,CHELLE, PA-C   Chief Complaint: shortness of breath  HPI: Kahlan Bucceri is a 54 y.o. male the past medical history of hypertension, type 2 diabetes, presents to the ER with the above complaints. Patient reports being in his usual state of health approximately 2 weeks ago subsequently developed a nonproductive cough, followed by progressive dyspnea on excision associated with PND and orthopnea. He did not notice lower extremity edema did notice some tightness and swelling in his abdominal wall he denies any fevers or chills. Due to worsening of his symptoms he was not able to function and work like usual and came to the ER since his fiance persisted on an evaluation. In the emergency room noted to have bilateral pleural pleural effusions, BNP of 970, EKG shows nonspecific ST-T wave changes.  Review of Systems:  positives bolded Constitutional:  No weight loss, night sweats, Fevers, chills, fatigue.  HEENT:  No headaches, Difficulty swallowing,Tooth/dental problems,Sore throat,  No sneezing, itching, ear ache, nasal congestion, post nasal drip,  Cardio-vascular:  No chest pain, Orthopnea, PND, swelling in lower extremities, anasarca, dizziness, palpitations  GI:  No heartburn, indigestion, abdominal pain, nausea, vomiting, diarrhea, change in bowel habits, loss of appetite  Resp:  No shortness of breath with exertion or at rest. No excess mucus, no productive cough,  non-productive coughNo, No coughing up of blood.No change in color of mucus.No wheezing.No chest wall deformity  Skin:  no rash or lesions.  GU:  no dysuria, change in color of urine, no urgency or frequency. No flank pain.  Musculoskeletal:  No joint pain or swelling. No decreased range of motion. No back pain.  Psych:  No change in mood or affect. No depression or anxiety.  No memory loss.   Past Medical History  Diagnosis Date  . Diabetes mellitus   . Hypertension   . Erectile dysfunction   . Hyperlipidemia    Past Surgical History  Procedure Laterality Date  . Hernia repair  7th grade    umbilical  . Umbilical hernia repair  35 years ago   Social History:  reports that he has never smoked. He has never used smokeless tobacco. He reports that he does not drink alcohol or use illicit drugs.  No Known Allergies  Family History  Problem Relation Age of Onset  . Arthritis Mother   . COPD Mother   . Diabetes Mother     was thin, took insulin  . Hypertension Mother   . Hypertension Father   . Arthritis Sister     rheumatoid  . COPD Sister   . Diabetes Brother   . Colon cancer Neg Hx     Prior to Admission medications   Medication Sig Start Date End Date Taking? Authorizing Provider  Albuterol Sulfate (PROAIR RESPICLICK) 123XX123 (90 BASE) MCG/ACT AEPB Inhale 2 puffs into the lungs every 6 (six) hours as needed. 04/27/15   Robyn Haber, MD  azithromycin (ZITHROMAX) 250 MG tablet Take 2 tabs PO x 1 dose, then 1 tab PO QD x 4 days 04/27/15   Robyn Haber, MD  carvedilol (COREG) 12.5 MG tablet Take 1 tablet (12.5 mg total) by mouth 2 (two) times daily with a meal. Patient taking differently: Take 12.5 mg by mouth 2 (two) times daily with a meal. Patient is taking 1/2 pill in the am and 1/2 pill in the pm 07/12/14   Harrison Mons, PA-C  lisinopril-hydrochlorothiazide (  PRINZIDE,ZESTORETIC) 20-25 MG per tablet Take 1 tablet by mouth daily. 07/12/14   Chelle Jeffery, PA-C  lovastatin (MEVACOR) 20 MG tablet Take 1 tablet (20 mg total) by mouth daily. 07/12/14   Chelle Jeffery, PA-C  metFORMIN (GLUCOPHAGE) 1000 MG tablet Take 1,000 mg by mouth 2 (two) times daily with a meal.    Historical Provider, MD  sildenafil (VIAGRA) 100 MG tablet Take 1 tablet (100 mg total) by mouth daily as needed for erectile dysfunction. Patient not taking: Reported on 12/01/2014  07/12/14   Harrison Mons, PA-C  verapamil (CALAN) 120 MG tablet Take 1 tablet (120 mg total) by mouth 2 (two) times daily. 07/12/14   Harrison Mons, PA-C   Physical Exam: Filed Vitals:   05/02/15 1504 05/02/15 1609 05/02/15 1659 05/02/15 1700  BP: 158/102 180/120 203/134 180/104  Pulse: 86 81 94   Temp: 98 F (36.7 C) 98.3 F (36.8 C)  99.3 F (37.4 C)  TempSrc: Oral Oral  Oral  Resp: 18 20 22    Height:   5\' 9"  (1.753 m)   Weight:   78.608 kg (173 lb 4.8 oz)   SpO2: 98% 100% 98%     Wt Readings from Last 3 Encounters:  05/02/15 78.608 kg (173 lb 4.8 oz)  04/27/15 78.926 kg (174 lb)  12/01/14 76.658 kg (169 lb)    General:  Appears calm and comfortable, averagely built man sitting in the chair, no distress  Eyes: PERRL, normal lids, irises & conjunctiva ENT: grossly normal hearing, lips & tongue Neck: JVD mildly elevated  Cardiovascular: RRR, no m/r/g. No LE edema. Telemetry: SR, no arrhythmias  Respiratory: decrease breath sounds at the bases , and crackels and  Abdomen: soft, ntnd Skin: no rash or induration seen on limited exam Musculoskeletal: grossly normal tone BUE/BLE Psychiatric: grossly normal mood and affect, speech fluent and appropriate Neurologic: grossly non-focal.          Labs on Admission:  Basic Metabolic Panel:  Recent Labs Lab 05/02/15 1210  NA 137  K 4.3  CL 110  CO2 21*  GLUCOSE 169*  BUN 15  CREATININE 1.42*  CALCIUM 8.5*   Liver Function Tests: No results for input(s): AST, ALT, ALKPHOS, BILITOT, PROT, ALBUMIN in the last 168 hours. No results for input(s): LIPASE, AMYLASE in the last 168 hours. No results for input(s): AMMONIA in the last 168 hours. CBC:  Recent Labs Lab 05/02/15 1210  WBC 8.7  NEUTROABS 6.6  HGB 11.9*  HCT 35.4*  MCV 75.5*  PLT 289   Cardiac Enzymes:  Recent Labs Lab 05/02/15 1210  TROPONINI 0.04*    BNP (last 3 results)  Recent Labs  05/02/15 1210  BNP 970.6*    ProBNP (last 3  results) No results for input(s): PROBNP in the last 8760 hours.  CBG:  Recent Labs Lab 05/02/15 1147  GLUCAP 172*    Radiological Exams on Admission: Dg Chest 2 View  05/02/2015  CLINICAL DATA:  Cough and shortness of breath for 2 weeks. Initial encounter. EXAM: CHEST  2 VIEW COMPARISON:  None. FINDINGS: The patient has small bilateral pleural effusions. Streaky left basilar airspace disease is identified. The right lung is clear. Heart size is normal. IMPRESSION: Small bilateral pleural effusions and patchy left basilar airspace disease which could be due to atelectasis or pneumonia. Electronically Signed   By: Inge Rise M.D.   On: 05/02/2015 11:41    EKG: Independently reviewed. NSR, St T wave changes noted in inferior and lateral elads  Assessment/Plan  1. CHF exacerbation -IV Lasix 40 mg every 12 -Check 2-D echocardiogram -Check UA for proteinuria given mild renal insufficiency -Continue Coreg -I/O, daily weights -RD consult for diet education -ASA, trend troponins  Essential hypertension -Uncontrolled to correctly due to volume excess -Continue home regimen of verapamil, hold ace/HCTZ at this time -Hydralazine when necessary, add amlodipine -Expect blood pressure did improve with diuresis  Type 2 diabetes -CBGs stable on admission, hold metformin Sliding scale insulin for now, check HbA1c    Hyperlipidemia  -Continue statin, substituted to formulary -Follow-up LDL   Mild renal insufficiency, AKi on CK D2 -As above, could be related to cardiorenal syndrome -Monitor with diuresis, hold ACE  Code Status: Full Code DVT Prophylaxis:lovenox Family Communication: none at bedside Disposition Plan: home pending workup  Time spent: 33min   Dane Bloch [facility] Pager [phone]

## 2015-05-02 NOTE — ED Notes (Signed)
02 sats 98% pre, during, and post ambulation.

## 2015-05-02 NOTE — ED Notes (Signed)
Pt care transferred to [facility] staff, report to [facility] staff at bedside. Pt and wife are aware of plan of care, pending transport and inpatient admit to room 1417.

## 2015-05-02 NOTE — Progress Notes (Signed)
53/M with Htn, DM,  Now with DOE, Orthopnea, PND CXr with pleural effusions, BNP 970 Vitals stable Given lasix x1 Accepted to tele, Lanice Shirts, MD [phone]

## 2015-05-02 NOTE — ED Notes (Signed)
Pt amb to room 4 with quick steady gait in nad. Pt reports cough with sob x 2 weeks, initially took "some leftover antibiotics", then was seen at urgent care last week with z pack rx, finished z pack yesterday and cont with dry cough and sob at times. No sob at this time. Pt is smiling, resp easy and reg, in nad.

## 2015-05-02 NOTE — ED Notes (Signed)
Patient transported to & from xray dept.

## 2015-05-02 NOTE — ED Provider Notes (Signed)
CSN: [id number]     Arrival date & time 05/02/15  1047 History   First MD Initiated Contact with Patient 05/02/15 1124     Chief Complaint  Patient presents with  . Cough  . Shortness of Breath     (Consider location/radiation/quality/duration/timing/severity/associated sxs/prior Treatment) The history is provided by the patient and the spouse.     Patient is a 54 year old male with history of hypertension, diabetes and hyperlipidemia, presents to the ER for evaluation of SOB and cough which has worsened over the past several weeks despite treatment at 2 different antibiotics.  He endorses orthopnea, PND, fatigue and persistent dry cough. A few weeks ago he had no difficulty going up several flights of stairs and recently at his job he cannot get to the top of one flight of stairs without being extremely short of breath. His wife states that just walking from the parking lot into the ER lobby he was very short of breath as well. He denies any lower extremity edema, he denies palpitations. He states that he has chest pain in his anterior ribs only when he bends over and stands up otherwise he denies any chest pain and he states his ribs are not sore from coughing.  He states that he has been sleeping sitting straight up in a chair for the past several nights and he still wakes up feeling short of breath.  His cough is nonproductive and he denies fever, chills, sweats, wheeze, cold like symptoms.  He does not have any smoking history. He denies any cardiac history other than high blood pressure, and he reports compliance to his medications.  His father died from a massive MI when he was 15.    Past Medical History  Diagnosis Date  . Diabetes mellitus   . Hypertension   . Erectile dysfunction   . Hyperlipidemia    Past Surgical History  Procedure Laterality Date  . Hernia repair  7th grade    umbilical  . Umbilical hernia repair  35 years ago   Family History  Problem Relation Age of  Onset  . Arthritis Mother   . COPD Mother   . Diabetes Mother     was thin, took insulin  . Hypertension Mother   . Hypertension Father   . Arthritis Sister     rheumatoid  . COPD Sister   . Diabetes Brother   . Colon cancer Neg Hx    Social History  Substance Use Topics  . Smoking status: Never Smoker   . Smokeless tobacco: Never Used  . Alcohol Use: No    Review of Systems  Constitutional: Positive for activity change and fatigue. Negative for fever, chills, diaphoresis and appetite change.  HENT: Negative.   Eyes: Negative.   Respiratory: Positive for cough and shortness of breath. Negative for apnea, choking, chest tightness, wheezing and stridor.   Cardiovascular: Negative for chest pain, palpitations and leg swelling.  Gastrointestinal: Negative.   Endocrine: Negative.   Genitourinary: Negative.   Musculoskeletal: Negative.        Anterior rib pain  Skin: Negative.  Negative for color change and pallor.  Allergic/Immunologic: Negative.   Neurological: Negative.   Hematological: Negative.   Psychiatric/Behavioral: Negative.       Allergies  Review of patient's allergies indicates no known allergies.  Home Medications   Prior to Admission medications   Medication Sig Start Date End Date Taking? Authorizing Provider  Albuterol Sulfate (PROAIR RESPICLICK) 123XX123 (90 BASE) MCG/ACT AEPB Inhale 2  puffs into the lungs every 6 (six) hours as needed. 04/27/15   Robyn Haber, MD  azithromycin (ZITHROMAX) 250 MG tablet Take 2 tabs PO x 1 dose, then 1 tab PO QD x 4 days 04/27/15   Robyn Haber, MD  carvedilol (COREG) 12.5 MG tablet Take 1 tablet (12.5 mg total) by mouth 2 (two) times daily with a meal. Patient taking differently: Take 12.5 mg by mouth 2 (two) times daily with a meal. Patient is taking 1/2 pill in the am and 1/2 pill in the pm 07/12/14   Chelle Jeffery, PA-C  lisinopril-hydrochlorothiazide (PRINZIDE,ZESTORETIC) 20-25 MG per tablet Take 1 tablet by mouth  daily. 07/12/14   Chelle Jeffery, PA-C  lovastatin (MEVACOR) 20 MG tablet Take 1 tablet (20 mg total) by mouth daily. 07/12/14   Chelle Jeffery, PA-C  metFORMIN (GLUCOPHAGE) 1000 MG tablet Take 1,000 mg by mouth 2 (two) times daily with a meal.    Historical Provider, MD  sildenafil (VIAGRA) 100 MG tablet Take 1 tablet (100 mg total) by mouth daily as needed for erectile dysfunction. Patient not taking: Reported on 12/01/2014 07/12/14   Harrison Mons, PA-C  verapamil (CALAN) 120 MG tablet Take 1 tablet (120 mg total) by mouth 2 (two) times daily. 07/12/14   Chelle Jeffery, PA-C   BP 152/99 mmHg  Pulse 79  Temp(Src) 98.4 F (36.9 C) (Oral)  Resp 18  SpO2 99% Physical Exam  Constitutional: He is oriented to person, place, and time. He appears well-developed and well-nourished. No distress.  HENT:  Head: Normocephalic and atraumatic.  Nose: Nose normal.  Mouth/Throat: Oropharynx is clear and moist. No oropharyngeal exudate.  Eyes: Conjunctivae and EOM are normal. Pupils are equal, round, and reactive to light. Right eye exhibits no discharge. Left eye exhibits no discharge. No scleral icterus.  Neck: Normal range of motion. No JVD present. No tracheal deviation present. No thyromegaly present.  Cardiovascular: Normal rate, regular rhythm and intact distal pulses.  Exam reveals gallop. Exam reveals no friction rub.   No murmur heard. No lower extremity edema  Pulmonary/Chest: Effort normal. No respiratory distress. He has no wheezes. He has rales. He exhibits no tenderness.  Decreased breath sounds bilaterally at the bases with rales (L>R)   Abdominal: Soft. Bowel sounds are normal. He exhibits no distension and no mass. There is no tenderness. There is no rebound and no guarding.  Musculoskeletal: Normal range of motion. He exhibits no edema or tenderness.  Lymphadenopathy:    He has no cervical adenopathy.  Neurological: He is alert and oriented to person, place, and time. He has normal  reflexes. No cranial nerve deficit. He exhibits normal muscle tone. Coordination normal.  Skin: Skin is warm and dry. No rash noted. He is not diaphoretic. No erythema. No pallor.  Psychiatric: He has a normal mood and affect. His behavior is normal. Judgment and thought content normal.  Nursing note and vitals reviewed.   ED Course  Procedures (including critical care time) Labs Review Labs Reviewed  CBC WITH DIFFERENTIAL/PLATELET - Abnormal; Notable for the following:    Hemoglobin 11.9 (*)    HCT 35.4 (*)    MCV 75.5 (*)    MCH 25.4 (*)    All other components within normal limits  BASIC METABOLIC PANEL - Abnormal; Notable for the following:    CO2 21 (*)    Glucose, Bld 169 (*)    Creatinine, Ser 1.42 (*)    Calcium 8.5 (*)    GFR calc non Af  Amer 56 (*)    All other components within normal limits  BRAIN NATRIURETIC PEPTIDE - Abnormal; Notable for the following:    B Natriuretic Peptide 970.6 (*)    All other components within normal limits  TROPONIN I - Abnormal; Notable for the following:    Troponin I 0.04 (*)    All other components within normal limits  CBG MONITORING, ED - Abnormal; Notable for the following:    Glucose-Capillary 172 (*)    All other components within normal limits    Imaging Review Dg Chest 2 View  05/02/2015  CLINICAL DATA:  Cough and shortness of breath for 2 weeks. Initial encounter. EXAM: CHEST  2 VIEW COMPARISON:  None. FINDINGS: The patient has small bilateral pleural effusions. Streaky left basilar airspace disease is identified. The right lung is clear. Heart size is normal. IMPRESSION: Small bilateral pleural effusions and patchy left basilar airspace disease which could be due to atelectasis or pneumonia. Electronically Signed   By: Inge Rise M.D.   On: 05/02/2015 11:41   I have personally reviewed and evaluated these images and lab results as part of my medical decision-making.   EKG Interpretation   Date/Time:  Monday  May 02 2015 12:12:34 EST Ventricular Rate:  73 PR Interval:  206 QRS Duration: 94 QT Interval:  408 QTC Calculation: 449 R Axis:   48 Text Interpretation:  Normal sinus rhythm ST \\T \ T wave abnormality,  consider inferolateral ischemia Abnormal ECG Confirmed by DELO  MD,  DOUGLAS ([id number]) on 05/02/2015 12:52:46 PM      MDM   Patient presents to the ER with shortness of breath, worse with exertion which has been progressive over the past several weeks, also complains of a nonproductive cough which has not improved with Z-Pak  The patient had decreased breath sounds bilaterally the bases, significantly worse in the left lower lung fields with bilateral crackles. No wheeze. Patient is hypertensive at presentation but was not to, hypoxic or tachycardic, also afebrile and appeared stable  Chest x-ray was ordered as well as breathing treatments, CBG, basic labs, BNP, troponin, and EKG, he was also monitored while in the ER  Chest x-ray was pertinent for bilateral pleural effusions and patchy left basilar airspace disease.  EKG was normal sinus rhythm with nonspecific T-wave abnormalities, no EKG available for comparison.  Patient was found to have mildly elevated creatinine, microcytic anemia, BNP was 970 and troponin was 0.04.  The patient's dyspnea has a likely due to a new onset congestive heart failure. Risk factors include hyperlipidemia, diabetes, hypertension.  His symptoms are not consistent with ACS.  He was ambulated with pulse ox monitoring and did not desaturate, however pleural effusions are likely due to CHF and he needs to be diuresis.  The pt's case was discussed with Dr. Stark Jock, who was in agreement with , workup, assessment and plan to admit.  Althia Forts was paged for admission.  Pt will be admitted to [facility] by Dr. Broadus John for further workup and treatment of new onset CHF.  The 40 of Lasix was ordered prior to transfer.     Final diagnoses:  None        Delsa Grana, PA-C 05/03/15 2331  Veryl Speak, MD 05/10/15 [phone]

## 2015-05-03 ENCOUNTER — Inpatient Hospital Stay (HOSPITAL_COMMUNITY): Payer: Self-pay

## 2015-05-03 DIAGNOSIS — I509 Heart failure, unspecified: Secondary | ICD-10-CM

## 2015-05-03 LAB — CBC
HCT: 32.2 % — ABNORMAL LOW (ref 39.0–52.0)
HEMOGLOBIN: 11.1 g/dL — AB (ref 13.0–17.0)
MCH: 26.1 pg (ref 26.0–34.0)
MCHC: 34.5 g/dL (ref 30.0–36.0)
MCV: 75.6 fL — AB (ref 78.0–100.0)
Platelets: 288 10*3/uL (ref 150–400)
RBC: 4.26 MIL/uL (ref 4.22–5.81)
RDW: 13.8 % (ref 11.5–15.5)
WBC: 9.6 10*3/uL (ref 4.0–10.5)

## 2015-05-03 LAB — BASIC METABOLIC PANEL
Anion gap: 8 (ref 5–15)
BUN: 22 mg/dL — AB (ref 6–20)
CHLORIDE: 109 mmol/L (ref 101–111)
CO2: 24 mmol/L (ref 22–32)
Calcium: 8.8 mg/dL — ABNORMAL LOW (ref 8.9–10.3)
Creatinine, Ser: 1.63 mg/dL — ABNORMAL HIGH (ref 0.61–1.24)
GFR calc Af Amer: 55 mL/min — ABNORMAL LOW (ref >60)
GFR calc non Af Amer: 47 mL/min — ABNORMAL LOW (ref >60)
GLUCOSE: 211 mg/dL — AB (ref 65–99)
POTASSIUM: 4 mmol/L (ref 3.5–5.1)
SODIUM: 141 mmol/L (ref 135–145)

## 2015-05-03 LAB — GLUCOSE, CAPILLARY
GLUCOSE-CAPILLARY: 159 mg/dL — AB (ref 65–99)
Glucose-Capillary: 168 mg/dL — ABNORMAL HIGH (ref 65–99)
Glucose-Capillary: 124 mg/dL — ABNORMAL HIGH (ref 65–99)
Glucose-Capillary: 153 mg/dL — ABNORMAL HIGH (ref 65–99)

## 2015-05-03 LAB — LIPID PANEL
CHOL/HDL RATIO: 2.9 ratio
CHOLESTEROL: 203 mg/dL — AB (ref 0–200)
HDL: 69 mg/dL (ref >40)
LDL CALC: 119 mg/dL — AB (ref 0–99)
Triglycerides: 76 mg/dL (ref <150)
VLDL: 15 mg/dL (ref 0–40)

## 2015-05-03 LAB — TROPONIN I: Troponin I: <0.03 ng/mL (ref <0.031)

## 2015-05-03 MED ORDER — FUROSEMIDE 10 MG/ML IJ SOLN
20.0000 mg | Freq: Two times a day (BID) | INTRAMUSCULAR | Status: DC
  Administered 2015-05-03 – 2015-05-04 (×2): 20 mg via INTRAVENOUS
  Filled 2015-05-03 (×2): qty 2

## 2015-05-03 NOTE — Progress Notes (Signed)
*  PRELIMINARY RESULTS* Echocardiogram 2D Echocardiogram has been performed.  Leavy Cella 05/03/2015, 11:04 AM

## 2015-05-03 NOTE — Progress Notes (Signed)
Nutrition Education Note  RD consulted for nutrition education regarding new onset CHF.  RD provided "Low Sodium Nutrition Therapy" handout from the Academy of [employer]. Reviewed patient's dietary recall. Provided examples on ways to decrease sodium intake in diet. Discouraged intake of processed foods and use of salt shaker. Encouraged fresh fruits and vegetables as well as whole grain sources of carbohydrates to maximize fiber intake.   RD discussed why it is important for patient to adhere to diet recommendations, and emphasized the role of fluids, foods to avoid, and importance of weighing self daily. Teach back method used.  Expect fair compliance.  Body mass index is 25.68 kg/(m^2). Pt meets criteria for normal weight based on current BMI.  Current diet order is Carb Modified, patient is consuming approximately 100% of meals at this time. Labs and medications reviewed. No further nutrition interventions warranted at this time. RD contact information provided. If additional nutrition issues arise, please re-consult RD.   Brandon Anis. Quavion Boule, MS, RD LDN After Hours/Weekend Pager [phone]

## 2015-05-03 NOTE — Clinical Documentation Improvement (Signed)
Internal Medicine  Can the diagnosis of CHF be further specified?    Acuity - Acute, Chronic, Acute on Chronic   Type - Systolic, Diastolic, Systolic and Diastolic  Other  Clinically Undetermined   Document any associated diagnoses/conditions   Supporting Information: HTN, EKG with left ventricular hypertrophy, JVD mildly elevated  Treatment: 2-D echocardiogram, IV Lasix q 12 hrs,Coreg  Please update your documentation within the medical record to reflect your response to this query. Thank you  Please exercise your independent, professional judgment when responding. A specific answer is not anticipated or expected.   Thank You,  [place] [phone]

## 2015-05-03 NOTE — Care Management Note (Signed)
Case Management Note  Patient Details  Name: Brandon Carroll MRN: [id number] Date of Birth: 16-Nov-1960  Subjective/Objective:   54 y/o m admitted w/New CHF. From home.                 Action/Plan:d/c plan home.   Expected Discharge Date:                  Expected Discharge Plan:  Home/Self Care  In-House Referral:     Discharge planning Services  CM Consult  Post Acute Care Choice:    Choice offered to:     DME Arranged:    DME Agency:     HH Arranged:    HH Agency:     Status of Service:  In process, will continue to follow  Medicare Important Message Given:    Date Medicare IM Given:    Medicare IM give by:    Date Additional Medicare IM Given:    Additional Medicare Important Message give by:     If discussed at [place] of Stay Meetings, dates discussed:    Additional Comments:  Dessa Phi, RN 05/03/2015, 4:11 PM

## 2015-05-03 NOTE — Progress Notes (Signed)
Inpatient Diabetes Program Recommendations  AACE/ADA: New Consensus Statement on Inpatient Glycemic Control (2015)  Target Ranges:  Prepandial:   less than 140 mg/dL      Peak postprandial:   less than 180 mg/dL (1-2 hours)      Critically ill patients:  140 - 180 mg/dL   Review of Glycemic Control  Diabetes history: DM2 Outpatient Diabetes medications: metformin 1000 mg bid Current orders for Inpatient glycemic control: Novolog sensitive tidwc  Glucose - 211 this am. Awaiting HgbA1C.  Inpatient Diabetes Program Recommendations:  Insulin - Basal: Add Levemir 10 units QHS Correction (SSI): Add HS correction HgbA1C: Pending   Will continue to follow. Thank you. Lorenda Peck, RD, LDN, CDE Inpatient Diabetes Coordinator [phone]

## 2015-05-03 NOTE — Progress Notes (Signed)
[facility] PROGRESS NOTE  Demarquis Braganza [id number] DOB: 18-Feb-1961 DOA: 05/02/2015 PCP: JEFFERY,CHELLE, PA-C   HPI: Brandon Carroll is a 54 y.o. male the past medical history of hypertension, type 2 diabetes, presented to the ER with dyspnea. Patient reports nonproductive cough, followed by progressive dyspnea on excision associated with PND and orthopnea for 2 weeks.  In the emergency room noted to have bilateral pleural pleural effusions, BNP of 970, EKG shows nonspecific ST-T wave changes. Now on IV lasix, much better FU ECHO  Assessment/Plan:  CHF exacerbation -improving clinically, continue IV Lasix , cut down dose due to bump in creatinine -FU 2-D echocardiogram today -Continue Coreg -Negative 2.3L overnight since admission, FU daily weights -RD consult for diet education -ASA, troponins WNL  Essential hypertension -improving with diuresis -Continue home regimen of verapamil, hold ace/HCTZ at this time -Hydralazine when necessary, added amlodipine  Type 2 diabetes -CBGs stable on admission, hold metformin -Sliding scale insulin for now, Fu HbA1c   Hyperlipidemia  -Continue statin, substituted to formulary -Follow-up LDL  Mild renal insufficiency, AKi on CK D2 -baseline creatinine 1.3 in 6/16 -UA with macro albuminuria, 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 -Monitor with diuresis, hold ACE -cut down lasix dose today and change to PO tomorrow if appropriate  DVT proph: lovenox  Code Status:Full Code Family Communication: friend at bedside Disposition Plan: home in 1-2days  HPI/Subjective: Breathing much better, feels like a new person  Objective: Filed Vitals:   05/02/15 2257 05/03/15 0518  BP: 168/95 155/91  Pulse: 79 72  Temp:  97.9 F (36.6 C)  Resp: 16 18    Intake/Output Summary (Last 24 hours) at 05/03/15 1111 Last data filed at 05/03/15 0955  Gross per 24 hour  Intake    240 ml  Output   2875 ml  Net  -2635 ml   Filed  Weights   05/02/15 1659 05/03/15 0518  Weight: 78.608 kg (173 lb 4.8 oz) 78.926 kg (174 lb)    Exam:   General:  AAOx3  Cardiovascular:S1S2/RRR  Respiratory:diminished and few basilar crackles, much improved  Abdomen: soft, NT, BS present  Musculoskeletal: no edema c/c   Data Reviewed: Basic Metabolic Panel:  Recent Labs Lab 05/02/15 1210 05/03/15 0440  NA 137 141  K 4.3 4.0  CL 110 109  CO2 21* 24  GLUCOSE 169* 211*  BUN 15 22*  CREATININE 1.42* 1.63*  CALCIUM 8.5* 8.8*   Liver Function Tests: No results for input(s): AST, ALT, ALKPHOS, BILITOT, PROT, ALBUMIN in the last 168 hours. No results for input(s): LIPASE, AMYLASE in the last 168 hours. No results for input(s): AMMONIA in the last 168 hours. CBC:  Recent Labs Lab 05/02/15 1210 05/03/15 0440  WBC 8.7 9.6  NEUTROABS 6.6  --   HGB 11.9* 11.1*  HCT 35.4* 32.2*  MCV 75.5* 75.6*  PLT 289 288   Cardiac Enzymes:  Recent Labs Lab 05/02/15 1210 05/02/15 1820 05/02/15 2345  TROPONINI 0.04* 0.03 <0.03   BNP (last 3 results)  Recent Labs  05/02/15 1210  BNP 970.6*    ProBNP (last 3 results) No results for input(s): PROBNP in the last 8760 hours.  CBG:  Recent Labs Lab 05/02/15 1147 05/02/15 1826 05/02/15 2111 05/03/15 0735  GLUCAP 172* 195* 283* 168*    No results found for this or any previous visit (from the past 240 hour(s)).   Studies: Dg Chest 2 View  05/02/2015  CLINICAL DATA:  Cough and shortness of breath for 2 weeks. Initial encounter.  EXAM: CHEST  2 VIEW COMPARISON:  None. FINDINGS: The patient has small bilateral pleural effusions. Streaky left basilar airspace disease is identified. The right lung is clear. Heart size is normal. IMPRESSION: Small bilateral pleural effusions and patchy left basilar airspace disease which could be due to atelectasis or pneumonia. Electronically Signed   By: Inge Rise M.D.   On: 05/02/2015 11:41    Scheduled Meds: . amLODipine  10  mg Oral Daily  . aspirin  325 mg Oral Daily  . carvedilol  6.25 mg Oral BID WC  . enoxaparin (LOVENOX) injection  40 mg Subcutaneous Q24H  . furosemide  40 mg Intravenous Q12H  . insulin aspart  0-9 Units Subcutaneous TID WC  . pravastatin  20 mg Oral q1800  . verapamil  120 mg Oral BID   Continuous Infusions:  Antibiotics Given (last 72 hours)    None      Principal Problem:   CHF exacerbation ([place]) Active Problems:   HTN (hypertension)   Hyperlipidemia with target LDL less than 70   Diabetes ([place])   Congestive heart failure (CHF) ([place])    Time spent:15min    [facility]  [facility] Pager [phone]. If 7PM-7AM, please contact night-coverage at [email or web address], password [facility] 05/03/2015, 11:11 AM  LOS: 1 day

## 2015-05-04 ENCOUNTER — Inpatient Hospital Stay (HOSPITAL_COMMUNITY): Payer: BLUE CROSS/BLUE SHIELD

## 2015-05-04 DIAGNOSIS — N189 Chronic kidney disease, unspecified: Secondary | ICD-10-CM

## 2015-05-04 DIAGNOSIS — N521 Erectile dysfunction due to diseases classified elsewhere: Secondary | ICD-10-CM

## 2015-05-04 DIAGNOSIS — N179 Acute kidney failure, unspecified: Secondary | ICD-10-CM

## 2015-05-04 DIAGNOSIS — E1159 Type 2 diabetes mellitus with other circulatory complications: Secondary | ICD-10-CM

## 2015-05-04 DIAGNOSIS — J189 Pneumonia, unspecified organism: Secondary | ICD-10-CM | POA: Diagnosis present

## 2015-05-04 DIAGNOSIS — I5021 Acute systolic (congestive) heart failure: Secondary | ICD-10-CM

## 2015-05-04 DIAGNOSIS — D72829 Elevated white blood cell count, unspecified: Secondary | ICD-10-CM

## 2015-05-04 DIAGNOSIS — E1165 Type 2 diabetes mellitus with hyperglycemia: Secondary | ICD-10-CM

## 2015-05-04 DIAGNOSIS — E1129 Type 2 diabetes mellitus with other diabetic kidney complication: Secondary | ICD-10-CM | POA: Diagnosis present

## 2015-05-04 DIAGNOSIS — J181 Lobar pneumonia, unspecified organism: Secondary | ICD-10-CM

## 2015-05-04 HISTORY — DX: Chronic kidney disease, unspecified: N17.9

## 2015-05-04 HISTORY — DX: Chronic kidney disease, unspecified: N18.9

## 2015-05-04 HISTORY — DX: Acute systolic (congestive) heart failure: I50.21

## 2015-05-04 LAB — BASIC METABOLIC PANEL
ANION GAP: 9 (ref 5–15)
BUN: 20 mg/dL (ref 6–20)
CHLORIDE: 104 mmol/L (ref 101–111)
CO2: 28 mmol/L (ref 22–32)
Calcium: 8.7 mg/dL — ABNORMAL LOW (ref 8.9–10.3)
Creatinine, Ser: 1.31 mg/dL — ABNORMAL HIGH (ref 0.61–1.24)
GFR calc non Af Amer: >60 mL/min (ref >60)
Glucose, Bld: 141 mg/dL — ABNORMAL HIGH (ref 65–99)
POTASSIUM: 3.8 mmol/L (ref 3.5–5.1)
SODIUM: 141 mmol/L (ref 135–145)

## 2015-05-04 LAB — CBC
HCT: 34.9 % — ABNORMAL LOW (ref 39.0–52.0)
HEMOGLOBIN: 11.7 g/dL — AB (ref 13.0–17.0)
MCH: 25.4 pg — AB (ref 26.0–34.0)
MCHC: 33.5 g/dL (ref 30.0–36.0)
MCV: 75.9 fL — ABNORMAL LOW (ref 78.0–100.0)
Platelets: 299 10*3/uL (ref 150–400)
RBC: 4.6 MIL/uL (ref 4.22–5.81)
RDW: 13.9 % (ref 11.5–15.5)
WBC: 11.7 10*3/uL — ABNORMAL HIGH (ref 4.0–10.5)

## 2015-05-04 LAB — GLUCOSE, CAPILLARY
GLUCOSE-CAPILLARY: 137 mg/dL — AB (ref 65–99)
GLUCOSE-CAPILLARY: 151 mg/dL — AB (ref 65–99)
GLUCOSE-CAPILLARY: 155 mg/dL — AB (ref 65–99)
GLUCOSE-CAPILLARY: 155 mg/dL — AB (ref 65–99)

## 2015-05-04 LAB — HEMOGLOBIN A1C
Hgb A1c MFr Bld: 7 % — ABNORMAL HIGH (ref 4.8–5.6)
Mean Plasma Glucose: 154 mg/dL

## 2015-05-04 LAB — STREP PNEUMONIAE URINARY ANTIGEN: STREP PNEUMO URINARY ANTIGEN: NEGATIVE

## 2015-05-04 MED ORDER — LEVOFLOXACIN IN D5W 500 MG/100ML IV SOLN
500.0000 mg | INTRAVENOUS | Status: DC
  Administered 2015-05-04 – 2015-05-05 (×2): 500 mg via INTRAVENOUS
  Filled 2015-05-04 (×2): qty 100

## 2015-05-04 MED ORDER — GUAIFENESIN-DM 100-10 MG/5ML PO SYRP: 5.0000 mL | ORAL_SOLUTION | ORAL | Status: DC | PRN

## 2015-05-04 MED ORDER — FUROSEMIDE 20 MG PO TABS
20.0000 mg | ORAL_TABLET | Freq: Two times a day (BID) | ORAL | Status: DC
  Administered 2015-05-04 – 2015-05-05 (×2): 20 mg via ORAL
  Filled 2015-05-04 (×2): qty 1

## 2015-05-04 MED ORDER — GUAIFENESIN ER 600 MG PO TB12
600.0000 mg | ORAL_TABLET | Freq: Two times a day (BID) | ORAL | Status: DC
  Administered 2015-05-04 – 2015-05-05 (×3): 600 mg via ORAL
  Filled 2015-05-04 (×2): qty 1

## 2015-05-04 NOTE — Progress Notes (Signed)
Patient ID: Hershall Evanson, male   DOB: Aug 25, 1960, 54 y.o.   MRN: [id number]  [facility] PROGRESS NOTE  Yoneo Magpantay [id number] DOB: 05-Feb-1961 DOA: 05/02/2015 PCP: JEFFERY,CHELLE, PA-C   Brief narrative:    54 y.o. male with hypertension, type 2 diabetes, presented to the ER with dyspnea, nonproductive cough, chills, one week in duration, associated with PND, 2 pillow orthopnea.   Assessment/Plan:    Principal Problem:   Acute systolic CHF (congestive heart failure), NYHA class 2 (HCC) - per 2 D ECHO, EF 45% - currently on Lasix 20 mg IV BID and diuresing well - weight down from 173 --> 166 lbs this AM - will continue same regimen for now but will change Lasix to PO today  - monitor daily weights, strict I/O  Active Problems:   Acute renal injury ([place]) imposed on CKD stage II  - appears to be related to pre renal etiology imposed on Metformin and Lisinopril effect  - Metformin and Lisinopril on hold - ok to continue with lasix, Cr is trending down: 1.63 --> 1.31 - BMP in AM    Left lower lobe pneumonia - unknown pathogen - sputum culture requested - also requested CT chest for clearer evaluation - add Levaquin IV today  - follow upon sputum analysis     Leukocytosis - possibly related to PNA - CBC In AM    Uncontrolled type II diabetes circulatory disord erectile dysfunction (HCC), CKD stage II  - on Metformin at home - A1C 7 - on SSI for now due to AKI     Hyperlipidemia with target LDL less than 70 - continue statin     Accelerated hypertension - continue with Coreg and Norvasc, Lasix, Verapamil  - Lisinopril - HCTZ on hold until renal function stabilizes   DVT prophylaxis - Lovenox SQ  Code Status: Full.  Family Communication:  plan of care discussed with the patient and wife at bedside  Disposition Plan: Home by 12/23  IV access:  Peripheral IV  Procedures and diagnostic studies:    Dg Chest 2 View  05/02/2015  Small bilateral  pleural effusions and patchy left basilar airspace disease which could be due to atelectasis or pneumonia.   Medical Consultants:  None  Other Consultants:  None  IAnti-Infectives:   Levaquin 12/21 -->  Faye Ramsay, MD  [facility] Pager [phone]  If 7PM-7AM, please contact night-coverage [email or web address] Password [facility] 05/04/2015, 10:41 AM   LOS: 2 days   HPI/Subjective: No events overnight. Persistent chills and sweats overnight.   Objective: Filed Vitals:   05/03/15 1341 05/03/15 1934 05/04/15 0517 05/04/15 0908  BP: 137/89 149/93 157/97 161/103  Pulse: 70 74 78   Temp: 97.4 F (36.3 C)  98.2 F (36.8 C)   TempSrc: Oral Oral Oral   Resp: 18 18 18    Height:      Weight:   75.388 kg (166 lb 3.2 oz)   SpO2: 100% 100% 99%     Intake/Output Summary (Last 24 hours) at 05/04/15 1041 Last data filed at 05/04/15 0920  Gross per 24 hour  Intake   1180 ml  Output   1100 ml  Net     80 ml    Exam:   General:  Pt is alert, follows commands appropriately, not in acute distress  Cardiovascular: Regular rate and rhythm, S1/S2, no murmurs, no rubs, no gallops  Respiratory: Clear to auscultation bilaterally, dullness to percussion at the left lower lobe area   Abdomen: Soft, non tender,  non distended, bowel sounds present, no guarding  Extremities: pulses DP and PT palpable bilaterally  Neuro: Grossly nonfocal  Data Reviewed: Basic Metabolic Panel:  Recent Labs Lab 05/02/15 1210 05/03/15 0440 05/04/15 0453  NA 137 141 141  K 4.3 4.0 3.8  CL 110 109 104  CO2 21* 24 28  GLUCOSE 169* 211* 141*  BUN 15 22* 20  CREATININE 1.42* 1.63* 1.31*  CALCIUM 8.5* 8.8* 8.7*   CBC:  Recent Labs Lab 05/02/15 1210 05/03/15 0440 05/04/15 0453  WBC 8.7 9.6 11.7*  NEUTROABS 6.6  --   --   HGB 11.9* 11.1* 11.7*  HCT 35.4* 32.2* 34.9*  MCV 75.5* 75.6* 75.9*  PLT 289 288 299   Cardiac Enzymes:  Recent Labs Lab 05/02/15 1210 05/02/15 1820 05/02/15 2345   TROPONINI 0.04* 0.03 <0.03   CBG:  Recent Labs Lab 05/03/15 0735 05/03/15 1129 05/03/15 1636 05/03/15 2028 05/04/15 0817  GLUCAP 168* 124* 153* 159* 137*   Scheduled Meds: . amLODipine  10 mg Oral Daily  . aspirin  325 mg Oral Daily  . carvedilol  6.25 mg Oral BID WC  . enoxaparin (LOVENOX) injection  40 mg Subcutaneous Q24H  . furosemide  20 mg Intravenous Q12H  . insulin aspart  0-9 Units Subcutaneous TID WC  . levofloxacin (LEVAQUIN) IV  500 mg Intravenous Q24H  . pravastatin  20 mg Oral q1800  . verapamil  120 mg Oral BID   Continuous Infusions:

## 2015-05-05 DIAGNOSIS — I509 Heart failure, unspecified: Secondary | ICD-10-CM | POA: Insufficient documentation

## 2015-05-05 DIAGNOSIS — R0600 Dyspnea, unspecified: Secondary | ICD-10-CM | POA: Insufficient documentation

## 2015-05-05 LAB — GLUCOSE, CAPILLARY: Glucose-Capillary: 142 mg/dL — ABNORMAL HIGH (ref 65–99)

## 2015-05-05 LAB — CBC
HCT: 35.6 % — ABNORMAL LOW (ref 39.0–52.0)
HEMOGLOBIN: 12.1 g/dL — AB (ref 13.0–17.0)
MCH: 25.8 pg — ABNORMAL LOW (ref 26.0–34.0)
MCHC: 34 g/dL (ref 30.0–36.0)
MCV: 75.9 fL — ABNORMAL LOW (ref 78.0–100.0)
Platelets: 280 10*3/uL (ref 150–400)
RBC: 4.69 MIL/uL (ref 4.22–5.81)
RDW: 13.9 % (ref 11.5–15.5)
WBC: 7.7 10*3/uL (ref 4.0–10.5)

## 2015-05-05 LAB — BASIC METABOLIC PANEL
ANION GAP: 8 (ref 5–15)
BUN: 24 mg/dL — ABNORMAL HIGH (ref 6–20)
CHLORIDE: 101 mmol/L (ref 101–111)
CO2: 27 mmol/L (ref 22–32)
Calcium: 8.5 mg/dL — ABNORMAL LOW (ref 8.9–10.3)
Creatinine, Ser: 1.47 mg/dL — ABNORMAL HIGH (ref 0.61–1.24)
GFR calc non Af Amer: 54 mL/min — ABNORMAL LOW (ref >60)
Glucose, Bld: 141 mg/dL — ABNORMAL HIGH (ref 65–99)
POTASSIUM: 3.6 mmol/L (ref 3.5–5.1)
SODIUM: 136 mmol/L (ref 135–145)

## 2015-05-05 LAB — LEGIONELLA ANTIGEN, URINE

## 2015-05-05 MED ORDER — GUAIFENESIN ER 600 MG PO TB12: 600.0000 mg | ORAL_TABLET | Freq: Two times a day (BID) | ORAL | Status: DC

## 2015-05-05 MED ORDER — LEVOFLOXACIN 500 MG PO TABS: 500.0000 mg | ORAL_TABLET | Freq: Every day | ORAL | Status: DC

## 2015-05-05 MED ORDER — FUROSEMIDE 20 MG PO TABS: 20.0000 mg | ORAL_TABLET | Freq: Every day | ORAL | Status: DC

## 2015-05-05 MED ORDER — GLIPIZIDE 10 MG PO TABS: 10.0000 mg | ORAL_TABLET | Freq: Every day | ORAL | Status: DC

## 2015-05-05 NOTE — Progress Notes (Signed)
Patient discharged to home with belongings, IVs and tele removed. AVS and prescriptions given. Teach back performed, patient stable at time of discharge.

## 2015-05-05 NOTE — Discharge Instructions (Signed)
Stop taking metformin until you are seen by your doctor.   You need basic metabolic panel to be checked to make sure your kidney test is stable piror to resuming Metformin.  You were started on Glipizide for diabetes instead  Also please note you need Levaquin antibiotic for 7 more days  You were started on Lasix 20 mg daily table for congestive heart problem  Monitor your weight at home daily, your weight prior to discharge is 166 pounds   Community-Acquired Pneumonia, Adult Pneumonia is an infection of the lungs. There are different types of pneumonia. One type can develop while a person is in a hospital. A different type, called community-acquired pneumonia, develops in people who are not, or have not recently been, in the hospital or other health care facility.  CAUSES Pneumonia may be caused by bacteria, viruses, or funguses. Community-acquired pneumonia is often caused by Streptococcus pneumonia bacteria. These bacteria are often passed from one person to another by breathing in droplets from the cough or sneeze of an infected person. RISK FACTORS The condition is more likely to develop in:  People who havechronic diseases, such as chronic obstructive pulmonary disease (COPD), asthma, congestive heart failure, cystic fibrosis, diabetes, or kidney disease.  People who haveearly-stage or late-stage HIV.  People who havesickle cell disease.  People who havehad their spleen removed (splenectomy).  People who havepoor Human resources officer.  People who havemedical conditions that increase the risk of breathing in (aspirating) secretions their own mouth and nose.   People who havea weakened immune system (immunocompromised).  People who smoke.  People whotravel to areas where pneumonia-causing germs commonly exist.  People whoare around animal habitats or animals that have pneumonia-causing germs, including birds, bats, rabbits, cats, and farm animals. SYMPTOMS Symptoms  of this condition include:  Adry cough.  A wet (productive) cough.  Fever.  Sweating.  Chest pain, especially when breathing deeply or coughing.  Rapid breathing or difficulty breathing.  Shortness of breath.  Shaking chills.  Fatigue.  Muscle aches. DIAGNOSIS Your health care provider will take a medical history and perform a physical exam. You may also have other tests, including:  Imaging studies of your chest, including X-rays.  Tests to check your blood oxygen level and other blood gases.  Other tests on blood, mucus (sputum), fluid around your lungs (pleural fluid), and urine. If your pneumonia is severe, other tests may be done to identify the specific cause of your illness. TREATMENT The type of treatment that you receive depends on many factors, such as the cause of your pneumonia, the medicines you take, and other medical conditions that you have. For most adults, treatment and recovery from pneumonia may occur at home. In some cases, treatment must happen in a hospital. Treatment may include:  Antibiotic medicines, if the pneumonia was caused by bacteria.  Antiviral medicines, if the pneumonia was caused by a virus.  Medicines that are given by mouth or through an IV tube.  Oxygen.  Respiratory therapy. Although rare, treating severe pneumonia may include:  Mechanical ventilation. This is done if you are not breathing well on your own and you cannot maintain a safe blood oxygen level.  Thoracentesis. This procedureremoves fluid around one lung or both lungs to help you breathe better. HOME CARE INSTRUCTIONS  Take over-the-counter and prescription medicines only as told by your health care provider.  Only takecough medicine if you are losing sleep. Understand that cough medicine can prevent your body's natural ability to remove mucus  from your lungs.  If you were prescribed an antibiotic medicine, take it as told by your health care provider. Do not  stop taking the antibiotic even if you start to feel better.  Sleep in a semi-upright position at night. Try sleeping in a reclining chair, or place a few pillows under your head.  Do not use tobacco products, including cigarettes, chewing tobacco, and e-cigarettes. If you need help quitting, ask your health care provider.  Drink enough water to keep your urine clear or pale yellow. This will help to thin out mucus secretions in your lungs. PREVENTION There are ways that you can decrease your risk of developing community-acquired pneumonia. Consider getting a pneumococcal vaccine if:  You are older than 52 years of age.  You are older than 52 years of age and are undergoing cancer treatment, have chronic lung disease, or have other medical conditions that affect your immune system. Ask your health care provider if this applies to you. There are different types and schedules of pneumococcal vaccines. Ask your health care provider which vaccination option is best for you. You may also prevent community-acquired pneumonia if you take these actions:  Get an influenza vaccine every year. Ask your health care provider which type of influenza vaccine is best for you.  Go to the dentist on a regular basis.  Wash your hands often. Use hand sanitizer if soap and water are not available. SEEK MEDICAL CARE IF:  You have a fever.  You are losing sleep because you cannot control your cough with cough medicine. SEEK IMMEDIATE MEDICAL CARE IF:  You have worsening shortness of breath.  You have increased chest pain.  Your sickness becomes worse, especially if you are an older adult or have a weakened immune system.  You cough up blood.   This information is not intended to replace advice given to you by your health care provider. Make sure you discuss any questions you have with your health care provider.   Document Released: 04/30/2005 Document Revised: 01/19/2015 Document Reviewed:  08/25/2014 Elsevier Interactive Patient Education [employer].

## 2015-05-05 NOTE — Discharge Summary (Signed)
Physician Discharge Summary  Brandon Carroll [id number] DOB: 07-07-1960 DOA: 05/02/2015  PCP: JEFFERY,CHELLE, PA-C  Admit date: 05/02/2015 Discharge date: 05/05/2015  Recommendations for Outpatient Follow-up:  1. Pt will need to follow up with PCP in 1 week post discharge 2. Please obtain BMP to evaluate electrolytes and kidney function 3. Please also check CBC to evaluate Hg and Hct levels 4. Levaquin for 7 days upon discharge recommended and pt verbalized understanding  5. Need to see PCCM on January 4th, 2017, appointment scheduled 6. Stopped metformin and started Glipizide until renal function stabilizes  7. Pt also started on Lasix 20 mg daily tablet for congestive heart failure 8. Weight prior to discharge is 166 pounds 9. Please note is Cr continues trending up, consider stopping Lisinopril as well or even removing lasix if clinically allowed 10. 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 male with hypertension, type 2 diabetes, presented to the ER with dyspnea, nonproductive cough, chills, one week in duration, associated with PND, 2 pillow orthopnea.   Assessment/Plan:    Principal Problem:  Acute systolic CHF (congestive heart failure), NYHA class 2 (HCC) - per 2 D ECHO, EF 45% - currently on Lasix 20 mg IV BID and we have transitioned to oral Lasix on discharge  - weight down from 173 --> 166 lbs this AM  Active Problems:  Acute renal injury ([place]) imposed on CKD stage II  - appears  to be related to pre renal etiology imposed on Metformin and Lisinopril effect  - Metformin has been stopped until renal function stabilizes    Left lower lobe pneumonia - unknown pathogen - CT hcest worrisome for sarcoidosis, pt made aware, spoke with PCCM and outpatient follow up recommended - appointment has been scheduled    Leukocytosis - possibly related to PNA   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  Family Communication: plan of care discussed with the patient and wife at bedside  Disposition Plan: Home   IV access:  Peripheral IV  Procedures and diagnostic studies:   Dg Chest 2 View  05/02/2015 Small bilateral pleural effusions and patchy left basilar airspace disease which could be due to atelectasis or pneumonia.   Medical Consultants:  None  Other Consultants:  None  IAnti-Infectives:   Levaquin 12/21 -->     Discharge Exam: Filed Vitals:   05/05/15 0520 05/05/15 0802  BP: 147/90 141/109  Pulse: 70 72  Temp: 98 F (36.7 C)   Resp: 18    Filed Vitals:   05/04/15 1725 05/04/15 2105 05/05/15 0520 05/05/15 0802  BP: 151/93 136/84 147/90 141/109  Pulse: 76 76 70 72  Temp:  98 F (36.7 C) 98 F (36.7 C)   TempSrc:  Oral  Oral   Resp:  18 18   Height:      Weight:   75.615 kg (166 lb 11.2 oz)   SpO2:  100% 97%     General: Pt is alert, follows commands appropriately, not in acute distress Cardiovascular: Regular rate and rhythm, S1/S2 +, no murmurs, no rubs, no gallops Respiratory: Clear to auscultation bilaterally, no wheezing, no crackles, no rhonchi Abdominal: Soft, non tender, non distended, bowel sounds +, no  guarding Extremities: no edema, no cyanosis, pulses palpable bilaterally DP and PT Neuro: Grossly nonfocal  Discharge Instructions  Discharge Instructions    Diet - low sodium heart healthy    Complete by:  As directed      Increase activity slowly    Complete by:  As directed             Medication List    STOP taking these medications        azithromycin 250 MG tablet  Commonly known as:  ZITHROMAX     metFORMIN 1000 MG tablet  Commonly known as:  GLUCOPHAGE      TAKE these medications        Albuterol Sulfate 108 (90 BASE) MCG/ACT Aepb  Commonly known as:  PROAIR RESPICLICK  Inhale 2 puffs into the lungs every 6 (six) hours as needed.     carvedilol 12.5 MG tablet  Commonly known as:  COREG  Take 1 tablet (12.5 mg total) by mouth 2 (two) times daily with a meal.     furosemide 20 MG tablet  Commonly known as:  LASIX  Take 1 tablet (20 mg total) by mouth daily.     glipiZIDE 10 MG tablet  Commonly known as:  GLUCOTROL  Take 1 tablet (10 mg total) by mouth daily before breakfast.     guaiFENesin 600 MG 12 hr tablet  Commonly known as:  MUCINEX  Take 1 tablet (600 mg total) by mouth 2 (two) times daily.     levofloxacin 500 MG tablet  Commonly known as:  LEVAQUIN  Take 1 tablet (500 mg total) by mouth daily.     lisinopril-hydrochlorothiazide 20-25 MG tablet  Commonly known as:  PRINZIDE,ZESTORETIC  Take 1 tablet by mouth daily.     lovastatin 20 MG tablet  Commonly known as:  MEVACOR  Take 1 tablet (20 mg total) by mouth daily.     verapamil 120 MG tablet  Commonly known as:  CALAN  Take 1 tablet (120 mg total) by mouth 2 (two) times daily.            The results of significant diagnostics from this hospitalization (including imaging, microbiology, ancillary and laboratory) are listed below for reference.     Microbiology: No results found for this or any previous visit (from the past 240 hour(s)).   Labs: Basic Metabolic Panel:  Recent  Labs Lab 05/02/15 1210 05/03/15 0440 05/04/15 0453 05/05/15 0448  NA 137 141 141 136  K 4.3 4.0 3.8 3.6  CL 110 109 104 101  CO2 21* 24 28 27   GLUCOSE 169* 211* 141* 141*  BUN 15 22* 20 24*  CREATININE 1.42* 1.63* 1.31* 1.47*  CALCIUM 8.5* 8.8* 8.7* 8.5*   CBC:  Recent Labs Lab 05/02/15 1210 05/03/15 0440 05/04/15 0453 05/05/15 0448  WBC 8.7 9.6 11.7* 7.7  NEUTROABS 6.6  --   --   --   HGB 11.9* 11.1* 11.7* 12.1*  HCT 35.4* 32.2* 34.9* 35.6*  MCV 75.5* 75.6* 75.9* 75.9*  PLT 289 288 299 280   Cardiac Enzymes:  Recent Labs Lab 05/02/15 1210 05/02/15 1820 05/02/15 2345  TROPONINI 0.04* 0.03 <0.03   BNP: BNP (last 3 results)  Recent Labs  05/02/15 1210  BNP 970.6*   CBG:  Recent Labs Lab 05/04/15 0817 05/04/15 1146 05/04/15 1704 05/04/15 2058 05/05/15 0744  GLUCAP 137* 151* 155* 155* 142*   SIGNED: Time coordinating discharge: 30 minutes  MAGICK-MYERS, ISKRA, MD  [facility] 05/05/2015, 9:05 AM Pager [phone]  If 7PM-7AM, please contact night-coverage [email or web address] Password [email or web address]

## 2015-05-17 ENCOUNTER — Inpatient Hospital Stay: Payer: Self-pay | Admitting: Physician Assistant

## 2015-05-18 ENCOUNTER — Institutional Professional Consult (permissible substitution): Payer: BLUE CROSS/BLUE SHIELD | Admitting: Pulmonary Disease

## 2015-06-01 ENCOUNTER — Other Ambulatory Visit: Payer: Self-pay | Admitting: Physician Assistant

## 2015-06-01 ENCOUNTER — Ambulatory Visit (INDEPENDENT_AMBULATORY_CARE_PROVIDER_SITE_OTHER): Payer: BLUE CROSS/BLUE SHIELD

## 2015-06-01 ENCOUNTER — Ambulatory Visit (INDEPENDENT_AMBULATORY_CARE_PROVIDER_SITE_OTHER): Payer: BLUE CROSS/BLUE SHIELD | Admitting: Physician Assistant

## 2015-06-01 VITALS — BP 158/94 | HR 74 | Temp 98.5°F | Resp 20 | Ht 70.0 in | Wt 177.6 lb

## 2015-06-01 DIAGNOSIS — R0602 Shortness of breath: Secondary | ICD-10-CM | POA: Diagnosis not present

## 2015-06-01 DIAGNOSIS — E1165 Type 2 diabetes mellitus with hyperglycemia: Secondary | ICD-10-CM | POA: Diagnosis not present

## 2015-06-01 DIAGNOSIS — I5021 Acute systolic (congestive) heart failure: Secondary | ICD-10-CM

## 2015-06-01 DIAGNOSIS — J181 Lobar pneumonia, unspecified organism: Secondary | ICD-10-CM

## 2015-06-01 DIAGNOSIS — E1159 Type 2 diabetes mellitus with other circulatory complications: Secondary | ICD-10-CM

## 2015-06-01 DIAGNOSIS — E785 Hyperlipidemia, unspecified: Secondary | ICD-10-CM

## 2015-06-01 DIAGNOSIS — R748 Abnormal levels of other serum enzymes: Secondary | ICD-10-CM

## 2015-06-01 DIAGNOSIS — J189 Pneumonia, unspecified organism: Secondary | ICD-10-CM

## 2015-06-01 DIAGNOSIS — I10 Essential (primary) hypertension: Secondary | ICD-10-CM

## 2015-06-01 DIAGNOSIS — N289 Disorder of kidney and ureter, unspecified: Secondary | ICD-10-CM

## 2015-06-01 DIAGNOSIS — IMO0002 Reserved for concepts with insufficient information to code with codable children: Secondary | ICD-10-CM

## 2015-06-01 DIAGNOSIS — N521 Erectile dysfunction due to diseases classified elsewhere: Secondary | ICD-10-CM

## 2015-06-01 LAB — POCT CBC
GRANULOCYTE PERCENT: 75.2 % (ref 37–80)
HEMATOCRIT: 39.6 % — AB (ref 43.5–53.7)
Hemoglobin: 12.4 g/dL — AB (ref 14.1–18.1)
LYMPH, POC: 1.8 (ref 0.6–3.4)
MCH, POC: 24.7 pg — AB (ref 27–31.2)
MCHC: 31.3 g/dL — AB (ref 31.8–35.4)
MCV: 78.7 fL — AB (ref 80–97)
MID (CBC): 0.2 (ref 0–0.9)
MPV: 7.7 fL (ref 0–99.8)
PLATELET COUNT, POC: 302 10*3/uL (ref 142–424)
POC Granulocyte: 5.9 (ref 2–6.9)
POC LYMPH %: 22.6 % (ref 10–50)
POC MID %: 2.2 %M (ref 0–12)
RBC: 5.03 M/uL (ref 4.69–6.13)
RDW, POC: 14.1 %
WBC: 7.9 10*3/uL (ref 4.6–10.2)

## 2015-06-01 LAB — COMPREHENSIVE METABOLIC PANEL
ALBUMIN: 3.4 g/dL — AB (ref 3.6–5.1)
ALK PHOS: 235 U/L — AB (ref 40–115)
ALT: 28 U/L (ref 9–46)
AST: 27 U/L (ref 10–35)
BILIRUBIN TOTAL: 0.5 mg/dL (ref 0.2–1.2)
BUN: 14 mg/dL (ref 7–25)
CHLORIDE: 107 mmol/L (ref 98–110)
CO2: 27 mmol/L (ref 20–31)
CREATININE: 1.37 mg/dL — AB (ref 0.70–1.33)
Calcium: 9.3 mg/dL (ref 8.6–10.3)
Glucose, Bld: 129 mg/dL — ABNORMAL HIGH (ref 65–99)
Potassium: 5 mmol/L (ref 3.5–5.3)
SODIUM: 141 mmol/L (ref 135–146)
TOTAL PROTEIN: 7 g/dL (ref 6.1–8.1)

## 2015-06-01 LAB — GLUCOSE, POCT (MANUAL RESULT ENTRY): POC Glucose: 140 mg/dl — AB (ref 70–99)

## 2015-06-01 MED ORDER — FUROSEMIDE 20 MG PO TABS: 40.0000 mg | ORAL_TABLET | Freq: Two times a day (BID) | ORAL | Status: DC

## 2015-06-01 MED ORDER — GLIPIZIDE 10 MG PO TABS: 10.0000 mg | ORAL_TABLET | Freq: Two times a day (BID) | ORAL | Status: DC

## 2015-06-01 MED ORDER — LOVASTATIN 20 MG PO TABS: 20.0000 mg | ORAL_TABLET | Freq: Every day | ORAL | Status: DC

## 2015-06-01 MED ORDER — CARVEDILOL 12.5 MG PO TABS: 6.2500 mg | ORAL_TABLET | Freq: Two times a day (BID) | ORAL | Status: DC

## 2015-06-01 MED ORDER — VERAPAMIL HCL 120 MG PO TABS: 120.0000 mg | ORAL_TABLET | Freq: Two times a day (BID) | ORAL | Status: DC

## 2015-06-01 MED ORDER — LISINOPRIL 20 MG PO TABS: 20.0000 mg | ORAL_TABLET | Freq: Every day | ORAL | Status: DC

## 2015-06-01 NOTE — Patient Instructions (Addendum)
INCREASE the Lasix to 2 tablets twice a day. Weight yourself daily. If you weight 166 lbs or less, you may reduce it to 1 tablet twice daily. It's important that you take it every day.  Increase the glipizide to twice each day.  I have changed the lisnoprilHCTZ to just lisinopril.

## 2015-06-01 NOTE — Progress Notes (Signed)
Patient ID: Brandon Carroll, male    DOB: [birth year], 55 y.o.   MRN: [id number]  PCP: Shae Hinnenkamp, PA-C  Subjective:   Chief Complaint  Patient presents with  . Follow-up    pneumonia    HPI Presents for evaluation of recurrent shortness of breath.  He was seen here on 04/27/15 with one week of cough, SOB, dyspnea on exertion. BP at that visit was 150/100. On exam, he was noted to have a few faint wheezes. He was prescribed albuterol, Zpak.  His girlfriend took him to the ED on 05/02/15 when he became short of breath just tying his shoes. On exam there he had decreased breath sounds and rales. He was mildly anemic, had renal insuffuciency and BNP was elevated at 970.6. CXR revealed bilateral pleural effusions.  It was determined that the dyspnea was due to new CHF, and he was admitted for diuresis.  Metformin was held (glipizide until he can resume the metformin) and he was started on Lasix 20 mg BID. Chest CT was concerning for sarcoidosis, and he will follow-up with pulmonology (06/08/2015). Also noted was 2V coronary atherosclerosis.  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 Pt will need to follow up with PCP in 1 week post discharge 2. Please obtain BMP to evaluate electrolytes and kidney function 3. Please also check CBC to evaluate Hg and Hct levels 4. Levaquin for 7 days upon discharge recommended and pt verbalized understanding  5. Need to see PCCM on January 4th, 2017, appointment scheduled 6. Stopped metformin and started Glipizide until renal function stabilizes   7. Pt also started on Lasix 20 mg daily tablet for congestive heart failure 8. Weight prior to discharge is 166 pounds 9. Please note is Cr continues trending up, consider stopping Lisinopril as well or even removing lasix if clinically allowed 10. Pt with EF 45%   Today he notes that he feels a lot better, but for several days he has noticed some recurrent SOB.  He noticed it was worse when lying down last night. He used the albuterol inhaler, but isn't sure that it helped. Propped himself up on 3 pillows, and went to sleep. Prior to that, he was sleeping on 1 pillow. 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 He only takes the lasix on the days he doesn't work: 3-on-4-off-4-on-3-off.  No chest pain. The cough had resolved, but returned a few days ago. Non-productive. No fever/chills. No nausea, vomiting or diarrhea. No swelling in the feet/ankles.   Of note, he did see endocrinology in July, referred to help manage his uncontrolled DM and due to low testosterone. I have reviewed the note, and while he had comprehensive evaluation of the diabetes, the testosterone was not addressed. He was advised to continue the Lantus, which he stopped due to cost, add Humalog with meals, and stop the metformin and Invokana. The patient never filled the Rx for Humalog, and had been taking the metofmin and working on healthier eating until his hospitalization. Of note, in the interim, his A1C improved from 9% to 7.0%.  Review  of Systems  Constitutional: Negative for fever, chills, diaphoresis, fatigue and unexpected weight change.  HENT: Negative for congestion, postnasal drip, rhinorrhea, sinus pressure and sore throat.   Eyes: Negative for visual disturbance.  Respiratory: Positive for shortness of breath. Negative for cough, choking, chest tightness and wheezing.   Cardiovascular: Negative for chest pain, palpitations and leg  swelling.  Gastrointestinal: Negative for nausea, vomiting, abdominal pain and diarrhea.  Genitourinary: Positive for frequency. Negative for dysuria, urgency, hematuria and flank pain.  Musculoskeletal: Negative for myalgias, back pain and arthralgias.  Skin: Negative for rash and wound.  Neurological: Negative for dizziness, weakness, light-headedness, numbness and headaches.  Hematological: Negative for adenopathy. Does not bruise/bleed easily.       Patient Active Problem List   Diagnosis Date Noted  . Acute congestive heart failure ([place])   . Dyspnea   . Acute systolic CHF (congestive heart failure), NYHA class 2 ([place]) 05/04/2015  . Accelerated hypertension 05/04/2015  . Leukocytosis 05/04/2015  . Left lower lobe pneumonia 05/04/2015  . 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 Acute-on-chronic kidney injury ([place]) 05/04/2015  . Hyperlipidemia with target LDL less than 70 09/18/2011     Prior to Admission medications   Medication Sig Start Date End Date Taking? Authorizing Provider  Albuterol Sulfate (PROAIR RESPICLICK) 123XX123 (90 BASE) MCG/ACT AEPB Inhale 2 puffs into the lungs every 6 (six) hours as needed. Patient taking differently: Inhale 2 puffs into the lungs every 6 (six) hours as needed (shortness of breathe).  04/27/15  Yes Robyn Haber, MD  carvedilol (COREG) 12.5 MG tablet Take 1 tablet (12.5 mg total) by mouth 2 (two) times daily with a meal. Patient taking differently: Take 12.5 mg by mouth 2 (two) times daily with a meal. Patient is taking 1/2 pill in the am and 1/2 pill in the pm 07/12/14  Yes Burnard Enis, PA-C  furosemide (LASIX) 20 MG tablet Take 1 tablet (20 mg total) by mouth daily. 05/05/15  Yes Theodis Blaze, MD  glipiZIDE (GLUCOTROL) 10 MG tablet Take 1 tablet (10 mg total) by mouth daily before breakfast. 05/05/15  Yes Theodis Blaze, MD  guaiFENesin (MUCINEX) 600 MG 12 hr tablet Take 1 tablet  (600 mg total) by mouth 2 (two) times daily. 05/05/15  Yes Theodis Blaze, MD  lisinopril-hydrochlorothiazide (PRINZIDE,ZESTORETIC) 20-25 MG per tablet Take 1 tablet by mouth daily. 07/12/14  Yes Nikolaos Maddocks, PA-C  lovastatin (MEVACOR) 20 MG tablet Take 1 tablet (20 mg total) by mouth daily. 07/12/14  Yes Taneasha Fuqua, PA-C  verapamil (CALAN) 120 MG tablet Take 1 tablet (120 mg total) by mouth 2 (two) times daily. 07/12/14  Yes Danelia Snodgrass, PA-C     No Known Allergies     Objective:  Physical Exam  Constitutional: He is oriented to person, place, and time. Vital signs are normal. He appears well-developed and well-nourished. He is active and cooperative. No distress.  BP 160/90 mmHg  Pulse 74  Temp(Src) 98.5 F (36.9 C) (Oral)  Resp 20  Ht 5\' 10"  (1.778 m)  Wt 177 lb 9.6 oz (80.559 kg)  BMI 25.48 kg/m2  SpO2 97% Hospital admission weight [facility] discharge weight 166  HENT:  Head: Normocephalic and atraumatic.  Right Ear: Hearing, tympanic membrane, external ear and ear canal normal.  Left Ear: Hearing, tympanic membrane, external ear and ear canal normal.  Nose: Nose normal.  Mouth/Throat: Uvula is midline, oropharynx is clear and moist and  mucous membranes are normal. No oral lesions.  Eyes: Conjunctivae, EOM and lids are normal. Pupils are equal, round, and reactive to light. No scleral icterus.  Neck: Normal range of motion, full passive range of motion without pain and phonation normal. Neck supple. No JVD present. No thyromegaly present.  Cardiovascular: Normal rate, regular rhythm and normal heart sounds.   Pulses:      Radial pulses are 2+ on the right side, and 2+ on the left side.  Pulmonary/Chest: Effort normal and breath sounds normal.  Musculoskeletal:       Right lower leg: He exhibits no edema.       Left lower leg: He exhibits no edema.  Lymphadenopathy:       Head (right side): No tonsillar, no preauricular, no posterior auricular and no occipital  adenopathy present.       Head (left side): No tonsillar, no preauricular, no posterior auricular and no occipital adenopathy present.    He has no cervical adenopathy.       Right: No supraclavicular adenopathy present.       Left: No supraclavicular adenopathy present.  Neurological: He is alert and oriented to person, place, and time. No sensory deficit.  Skin: Skin is warm, dry and intact. No rash noted. No cyanosis or erythema. Nails show no clubbing.  Psychiatric: He has a normal mood and affect. His speech is normal and behavior is normal.    Results for orders placed or performed in visit on 06/01/15  POCT CBC  Result Value Ref Range   WBC 7.9 4.6 - 10.2 K/uL   Lymph, poc 1.8 0.6 - 3.4   POC LYMPH PERCENT 22.6 10 - 50 %L   MID (cbc) 0.2 0 - 0.9   POC MID % 2.2 0 - 12 %M   POC Granulocyte 5.9 2 - 6.9   Granulocyte percent 75.2 37 - 80 %G   RBC 5.03 4.69 - 6.13 M/uL   Hemoglobin 12.4 (A) 14.1 - 18.1 g/dL   HCT, POC 39.6 (A) 43.5 - 53.7 %   MCV 78.7 (A) 80 - 97 fL   MCH, POC 24.7 (A) 27 - 31.2 pg   MCHC 31.3 (A) 31.8 - 35.4 g/dL   RDW, POC 14.1 %   Platelet Count, POC 302 142 - 424 K/uL   MPV 7.7 0 - 99.8 fL  POCT glucose (manual entry)  Result Value Ref Range   POC Glucose 140 (A) 70 - 99 mg/dl   CXR: [facility] reading (PRIMARY) by  Dr. Tamala Julian. Bilateral pleural effusions. No infiltrates.          Assessment & Plan:   1. Acute systolic congestive heart failure (HCC) Increase Lasix to 40 mg BID and stressed the importance of taking this as prescribed. Weigh self daily, and if his weight drops to 166 lbs, he may reduce the Lasix to 20 mg BID. Needs better HTN control, but hasn't tolerated higher doses of Coreg due to headache.  With the increase in Lasix, D/C lisinopril HCTZ and start lisinopril. Will need repeat BMET at follow-up and anticipate adding K+ supplementation. - TSH - furosemide (LASIX) 20 MG tablet; Take 2 tablets (40 mg total) by mouth 2 (two) times daily.  Weigh daily. When weight is 166 or less, reduce to 1 tablet BID.  Dispense: 120 tablet; Refill: 0 - Ambulatory referral to Cardiology  2. SOB (shortness of breath) Due to #1. - POCT CBC - DG Chest 2 View; Future - Ambulatory referral to  Cardiology  3. Renal insufficiency, mild Await CMET results. If Creatinine trending upwards, consider reducing lisinopril.  4. Accelerated hypertension See above. - Comprehensive metabolic panel - TSH - carvedilol (COREG) 12.5 MG tablet; Take 0.5 tablets (6.25 mg total) by mouth 2 (two) times daily with a meal.  Dispense: 60 tablet; Refill: 5 - verapamil (CALAN) 120 MG tablet; Take 1 tablet (120 mg total) by mouth 2 (two) times daily.  Dispense: 60 tablet; Refill: 5 - lisinopril (PRINIVIL,ZESTRIL) 20 MG tablet; Take 1 tablet (20 mg total) by mouth daily.  Dispense: 90 tablet; Refill: 3 - Ambulatory referral to Cardiology  5. 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 A1C during hospitalization was 7%. Continue current glipizide until renal function has improved, then plan switch back to metformin. - POCT glucose (manual entry) - Comprehensive metabolic panel - glipiZIDE (GLUCOTROL) 10 MG tablet; Take 1 tablet (10 mg total) by mouth 2 (two) times daily before a meal.  Dispense: 60 tablet; Refill: 1  6. Left lower lobe pneumonia Appears resolved on today's CXR. - DG Chest 2 View; Future  7. Hyperlipidemia with target LDL less than 70 Continue Mevacor. Given 2-vessel coronary atherosclerosis, cardiology may recommend increased dose or different agent. - lovastatin (MEVACOR) 20 MG tablet; Take 1 tablet (20 mg total) by mouth daily.  Dispense: 30 tablet; Refill: 5   Return in about 5 days (around 06/06/2015) for re-evaluation (8a-4p). RTC sooner if develops CP or worsening SOB.    [name], PA-C Physician Assistant-Certified [facility]

## 2015-06-02 LAB — TSH: TSH: 1.77 u[IU]/mL (ref 0.350–4.500)

## 2015-06-06 ENCOUNTER — Ambulatory Visit (INDEPENDENT_AMBULATORY_CARE_PROVIDER_SITE_OTHER): Payer: BLUE CROSS/BLUE SHIELD | Admitting: Physician Assistant

## 2015-06-06 VITALS — BP 160/101 | HR 68 | Temp 98.0°F | Resp 16 | Ht 70.0 in | Wt 170.0 lb

## 2015-06-06 DIAGNOSIS — I5021 Acute systolic (congestive) heart failure: Secondary | ICD-10-CM | POA: Diagnosis not present

## 2015-06-06 DIAGNOSIS — D649 Anemia, unspecified: Secondary | ICD-10-CM | POA: Diagnosis not present

## 2015-06-06 DIAGNOSIS — I10 Essential (primary) hypertension: Secondary | ICD-10-CM

## 2015-06-06 HISTORY — DX: Anemia, unspecified: D64.9

## 2015-06-06 LAB — ALKALINE PHOSPHATASE ISOENZYMES
Alkaline Phonsphatase: 240 U/L — ABNORMAL HIGH (ref 40–115)
Bone Isoenzymes: 2 % — ABNORMAL LOW (ref 28–66)
INTESTINAL ISOENZYMES (ALP ISO): 0 % — AB (ref 1–24)
Liver Isoenzymes: 88 % — ABNORMAL HIGH (ref 25–69)
Macrohepatic isoenzymes: 10 % — ABNORMAL HIGH

## 2015-06-06 MED ORDER — LISINOPRIL 40 MG PO TABS: 40.0000 mg | ORAL_TABLET | Freq: Every day | ORAL | Status: DC

## 2015-06-06 NOTE — Progress Notes (Signed)
Patient ID: Brandon Carroll, male    DOB: 1960-08-21, 55 y.o.   MRN: [id number]  PCP: Willliam Pettet, PA-C  Subjective:   Chief Complaint  Patient presents with  . Follow-up    pneumonia     HPI Presents for evaluation of CHF.  Last week he presented for delayed hospital follow-up when his cough and SOB returned. He had been hospitalized with what was initially thought to be pneumonia, and ultimately diagnosed as CHF and possibly sarcoid. Since hospital discharge his weight had increased 11 lbs, though he had no LE edema and lung exam was normal. He was not taking the furosemide daily due to the inconvenience of urinating frequently while working.  Today he reports that he feels good. Denies CP, SOB, HA, dizziness.  Cough is minimal.  After several days of furosemide 40 mg BID, he reduced the dose back down to 20 mg BID and is taking it consistently. He is also tolerating the change from lisinoprilHCTZ to lisinopril.  Colon cancer screening/anemia evaluation 11/23/2014-normal colonoscopy. Sees pulmonology in 2 days. Received a call from cardiology-needs to call them back to schedule.  Review of Systems  Constitutional: Negative for fever, chills, diaphoresis, fatigue and unexpected weight change.  HENT: Negative for congestion, sinus pressure and sore throat.   Eyes: Negative for visual disturbance.  Respiratory: Positive for cough (minimal). Negative for chest tightness, shortness of breath and wheezing.   Cardiovascular: Negative for chest pain, palpitations and leg swelling.  Gastrointestinal: Negative for nausea, vomiting, abdominal pain, diarrhea and constipation.  Genitourinary: Positive for frequency (improving). Negative for dysuria, urgency and hematuria.  Musculoskeletal: Negative for back pain and arthralgias.  Neurological: Negative for dizziness, weakness and headaches.       Patient Active Problem List   Diagnosis Date Noted  . Anemia 06/06/2015  .  Dyspnea   . Acute systolic CHF (congestive heart failure), NYHA class 2 ([place]) 05/04/2015  . Accelerated hypertension 05/04/2015  . Leukocytosis 05/04/2015  . controlled type II diabetes circulatory disord erectile dysfunction, CKD stage II ([place]) 05/04/2015  . Acute-on-chronic kidney injury ([place]) 05/04/2015  . Hyperlipidemia with target LDL less than 70 09/18/2011     Prior to Admission medications   Medication Sig Start Date End Date Taking? Authorizing Provider  Albuterol Sulfate (PROAIR RESPICLICK) 295 (90 BASE) MCG/ACT AEPB Inhale 2 puffs into the lungs every 6 (six) hours as needed. Patient taking differently: Inhale 2 puffs into the lungs every 6 (six) hours as needed (shortness of breathe).  04/27/15  Yes Robyn Haber, MD  carvedilol (COREG) 12.5 MG tablet Take 0.5 tablets (6.25 mg total) by mouth 2 (two) times daily with a meal. 06/01/15  Yes Amery Vandenbos, PA-C  furosemide (LASIX) 20 MG tablet Take 2 tablets (40 mg total) by mouth 2 (two) times daily. Weigh daily. When weight is 166 or less, reduce to 1 tablet BID. 06/01/15  Yes Pamla Pangle, PA-C  glipiZIDE (GLUCOTROL) 10 MG tablet Take 1 tablet (10 mg total) by mouth 2 (two) times daily before a meal. 06/01/15  Yes Kieryn Burtis, PA-C  guaiFENesin (MUCINEX) 600 MG 12 hr tablet Take 1 tablet (600 mg total) by mouth 2 (two) times daily. 05/05/15  Yes Theodis Blaze, MD  lisinopril (PRINIVIL,ZESTRIL) 20 MG tablet Take 1 tablet (20 mg total) by mouth daily. 06/01/15  Yes Marilla Boddy, PA-C  lovastatin (MEVACOR) 20 MG tablet Take 1 tablet (20 mg total) by mouth daily. 06/01/15  Yes Damichael Hofman, PA-C  verapamil (CALAN) 120 MG  tablet Take 1 tablet (120 mg total) by mouth 2 (two) times daily. 06/01/15  Yes Jaymir Struble, PA-C     No Known Allergies     Objective:  Physical Exam  Constitutional: He is oriented to person, place, and time. Vital signs are normal. He appears well-developed and well-nourished. He is active and  cooperative. No distress.  BP 160/101 mmHg  Pulse 68  Temp(Src) 98 F (36.7 C) (Oral)  Resp 16  Ht 5' 10"  (1.778 m)  Wt 170 lb (77.111 kg)  BMI 24.39 kg/m2  SpO2 98%  HENT:  Head: Normocephalic and atraumatic.  Right Ear: Hearing normal.  Left Ear: Hearing normal.  Eyes: Conjunctivae are normal. No scleral icterus.  Neck: Normal range of motion. Neck supple. No thyromegaly present.  Cardiovascular: Normal rate, regular rhythm and normal heart sounds.   Pulses:      Radial pulses are 2+ on the right side, and 2+ on the left side.  No LE edema  Pulmonary/Chest: Effort normal and breath sounds normal.  Lymphadenopathy:       Head (right side): No tonsillar, no preauricular, no posterior auricular and no occipital adenopathy present.       Head (left side): No tonsillar, no preauricular, no posterior auricular and no occipital adenopathy present.    He has no cervical adenopathy.       Right: No supraclavicular adenopathy present.       Left: No supraclavicular adenopathy present.  Neurological: He is alert and oriented to person, place, and time. No sensory deficit.  Skin: Skin is warm, dry and intact. No rash noted. No cyanosis or erythema. Nails show no clubbing.  Psychiatric: He has a normal mood and affect. His behavior is normal.   Results for orders placed or performed in visit on 06/01/15  Comprehensive metabolic panel  Result Value Ref Range   Sodium 141 135 - 146 mmol/L   Potassium 5.0 3.5 - 5.3 mmol/L   Chloride 107 98 - 110 mmol/L   CO2 27 20 - 31 mmol/L   Glucose, Bld 129 (H) 65 - 99 mg/dL   BUN 14 7 - 25 mg/dL   Creat 1.37 (H) 0.70 - 1.33 mg/dL   Total Bilirubin 0.5 0.2 - 1.2 mg/dL   Alkaline Phosphatase 235 (H) 40 - 115 U/L   AST 27 10 - 35 U/L   ALT 28 9 - 46 U/L   Total Protein 7.0 6.1 - 8.1 g/dL   Albumin 3.4 (L) 3.6 - 5.1 g/dL   Calcium 9.3 8.6 - 10.3 mg/dL  TSH  Result Value Ref Range   TSH 1.770 0.350 - 4.500 uIU/mL  POCT CBC  Result Value Ref  Range   WBC 7.9 4.6 - 10.2 K/uL   Lymph, poc 1.8 0.6 - 3.4   POC LYMPH PERCENT 22.6 10 - 50 %L   MID (cbc) 0.2 0 - 0.9   POC MID % 2.2 0 - 12 %M   POC Granulocyte 5.9 2 - 6.9   Granulocyte percent 75.2 37 - 80 %G   RBC 5.03 4.69 - 6.13 M/uL   Hemoglobin 12.4 (A) 14.1 - 18.1 g/dL   HCT, POC 39.6 (A) 43.5 - 53.7 %   MCV 78.7 (A) 80 - 97 fL   MCH, POC 24.7 (A) 27 - 31.2 pg   MCHC 31.3 (A) 31.8 - 35.4 g/dL   RDW, POC 14.1 %   Platelet Count, POC 302 142 - 424 K/uL   MPV 7.7 0 -  99.8 fL  POCT glucose (manual entry)  Result Value Ref Range   POC Glucose 140 (A) 70 - 99 mg/dl   Fractionation of Alk Phos is pending.        Assessment & Plan:   1. Acute systolic congestive heart failure (HCC) Improved from a symptom perspective. Continue furosemide 20 mg BID, and daily weights. Recommend weighing when he first gets up, but after toileting, each day. And increase of >3 lbs should prompt him to contact me. [facility] to [facility] to schedule appointment there for additional evaluation.  2. Accelerated hypertension Increase lisinopril to 40 mg daily. Repeat serum creatinine in 2 weeks. - lisinopril (PRINIVIL,ZESTRIL) 40 MG tablet; Take 1 tablet (40 mg total) by mouth daily.  Dispense: 90 tablet; Refill: 3  3. Anemia, unspecified anemia type Stable, but persists. Reassuring that colonoscopy was normal in July. Once he sees pulmonology and cardiology, plan hematology evaluation.    [name], PA-C Physician Assistant-Certified [facility]

## 2015-06-06 NOTE — Patient Instructions (Addendum)
Reduce the salt in your diet. Canned foods have a lot of salt as preservative, as do most pre-prepared foods.  Weigh yourself when you first get up, but after you use the toilet. If your weight increases by more than 3 pounds, you need to let me know.  INCREASE the lisinopril to 40 mg by taking 2 of the 20 mg tablets. When you have used them up, there is a new prescription at the pharmacy for the 40 mg tablets.  CONTINUE taking the Lasix TWO TIMES EVERY DAY.  Call the cardiology office to schedule an appointment there.  Once we sort all this out, we'll plan to send you to a blood specialist to understand why you are anemic.    Sarcoidosis Sarcoidosis is a disease that causes inflammation in your organs and other areas of your body. The lungs are most often affected (pulmonary sarcoidosis). Sarcoidosis can also affect your lymph nodes, liver, eyes, skin, or any other body tissue. When you have sarcoidosis, small clumps of tissue (granulomas) form in the affected area of your body. Granulomas are made up of your body's defense (immune) cells. Inflammation results when your body reacts to a harmful substance. Normally, inflammation goes away after immune cells get rid of the harmful substance. In sarcoidosis, the immune cells form granulomas instead. CAUSES  The exact cause of sarcoidosis is not known. Something triggers the immune system to respond, such as dust, chemicals, bacteria, or a virus.  RISK FACTORS You may be at a greater risk for sarcoidosis if you:   Have a family history of the disease.  Are African American.  Are of Northern European ancestry.  Are 83-35 years old.  Are male. SIGNS AND SYMPTOMS  Many people with sarcoidosis have no symptoms. Others have very mild symptoms. Sarcoidosis most often affects the lungs. Symptoms include:  Chest pain.  Coughing.  Wheezing.  Shortness of breath. Other common symptoms include:   Night sweats.  Weight  loss.  Fatigue.  Depression.  A sense of uneasiness. DIAGNOSIS  Sarcoidosis may be diagnosed by:   Medical history and physical exam.  Chest X-ray. This looks for granulomas in your lungs.  Lung function tests. These measure your breathing and look for problems related to sarcoidosis.  Examining a sample of tissue under a microscope (biopsy). TREATMENT  Sarcoidosis usually clears up without treatment. You may take medicines to reduce inflammation or relieve symptoms. These may include:  Prednisone. This steroid reduces inflammation related to sarcoidosis.  Chloroquine or hydroxychloroquine. These are antimalarial medicines used to treat sarcoidosis that affects the skin or brain.  Methotrexate, leflunomide, or azathioprine. These medicines affect the immune system and can help with sarcoidosis in the joints, eyes, skin, or lungs.  Inhalers. Inhaled medicines can help you breathe if sarcoidosis is affecting your lungs. HOME CARE INSTRUCTIONS  Do not use any tobacco products, including cigarettes, chewing tobacco, or electronic cigarettes. If you need help quitting, ask your health care provider.  Avoid secondhand smoke.  Avoid irritating dust and chemicals. Stay indoors on days when air quality is poor in your area.  Take medicines only as directed by your health care provider. SEEK MEDICAL CARE IF:  You have vision problems.  You have shortness of breath.  You have a dry, persistent cough.  You have an irregular heartbeat.  You have pain or ache in your joints, hands, or feet.  You have an unexplained rash. SEEK IMMEDIATE MEDICAL CARE IF:  You have chest pain.   This  information is not intended to replace advice given to you by your health care provider. Make sure you discuss any questions you have with your health care provider.   Document Released: 02/29/2004 Document Revised: 05/21/2014 Document Reviewed: 08/26/2013 Elsevier Interactive Patient Education  [employer].

## 2015-06-06 NOTE — Progress Notes (Signed)
   Subjective:    Patient ID: Brandon Carroll, male    DOB: 1960/08/12, 55 y.o.   MRN: [id number]  Chief Complaint  Patient presents with  . Follow-up    pneumonia     HPI Patient presents today for a PNA follow-up. She was last seen on 06/01/15, for an evaluation of SOB. He notes that the SOB has gone away, including with exertion. The cough has persisted, and is productive with clear sputum. He describes it as a dry cough, that occurs multiple times an hour in the evenings when he is at work.  He attributes this to work. He has been taking mucinex and completed a course of antibiotics. Denies hemoptysis.   He notes that he is using his albuterol inhaler twice a day.   He is currently using the lasix everyday. He is back to taking the medication 1 in the morning and one at night.  He has been weighing himself everyday in the morning after work. His weight usually 164-170lbs. He monitoring his BP at home. They have been running between 150-160/38mmHg.   Review of Systems  Constitutional: Negative for fever, chills, fatigue and unexpected weight change.  HENT: Negative for ear pain, sinus pressure, sore throat and trouble swallowing.   Eyes: Negative for pain.  Respiratory: Negative for chest tightness, shortness of breath and wheezing.   Cardiovascular: Negative for chest pain, palpitations and leg swelling.  Gastrointestinal: Negative for nausea, vomiting, abdominal pain and diarrhea.  Genitourinary: Positive for frequency. Negative for hematuria and flank pain.  Musculoskeletal: Negative for myalgias.  Skin: Negative for rash.  Neurological: Negative for dizziness, syncope, weakness, light-headedness and numbness.  Hematological: Does not bruise/bleed easily.  Psychiatric/Behavioral: Negative for sleep disturbance.       Objective:   Physical Exam  Constitutional: He appears well-developed and well-nourished. No distress.  BP 160/101 mmHg  Pulse 68  Temp(Src) 98 F (36.7 C)  (Oral)  Resp 16  Ht 5\' 10"  (1.778 m)  Wt 170 lb (77.111 kg)  BMI 24.39 kg/m2  SpO2 98%   HENT:  Head: Normocephalic and atraumatic.  Mouth/Throat: Oropharynx is clear and moist.  Eyes: Conjunctivae are normal. Pupils are equal, round, and reactive to light. No scleral icterus.  Neck: Neck supple. No JVD present. No thyromegaly present.  Cardiovascular: Normal rate, regular rhythm and normal heart sounds.  Exam reveals no gallop and no friction rub.   No murmur heard. Pulmonary/Chest: Breath sounds normal.  Lymphadenopathy:    He has no cervical adenopathy.  Skin: Skin is warm and dry.  Vitals reviewed.      Assessment & Plan:  1. Acute systolic congestive heart failure ([place]) 2. Accelerated hypertension - lisinopril (PRINIVIL,ZESTRIL) 40 MG tablet; Take 1 tablet (40 mg total) by mouth daily.  Dispense: 90 tablet; Refill: 3  3. Anemia, unspecified anemia type  Patient education given on limiting salt in diet. Patient education given on daily weighing. Instructed patient to increase lisinopril to 40mg . Instructed patient to continue lasix 2 twice a day.  Patient education given on sarcoidosis.   Return in about 2 weeks (around 06/20/2015) for re-evaluation heart failure.

## 2015-06-07 NOTE — Addendum Note (Signed)
Addended by: [name] on: 06/07/2015 12:51 PM   Modules accepted: Orders

## 2015-06-08 ENCOUNTER — Encounter: Payer: Self-pay | Admitting: Pulmonary Disease

## 2015-06-08 ENCOUNTER — Ambulatory Visit (INDEPENDENT_AMBULATORY_CARE_PROVIDER_SITE_OTHER): Payer: BLUE CROSS/BLUE SHIELD | Admitting: Pulmonary Disease

## 2015-06-08 ENCOUNTER — Encounter (INDEPENDENT_AMBULATORY_CARE_PROVIDER_SITE_OTHER): Payer: Self-pay

## 2015-06-08 VITALS — BP 132/80 | HR 70 | Temp 98.0°F | Ht 70.0 in | Wt 172.4 lb

## 2015-06-08 DIAGNOSIS — I10 Essential (primary) hypertension: Secondary | ICD-10-CM | POA: Diagnosis not present

## 2015-06-08 DIAGNOSIS — I5021 Acute systolic (congestive) heart failure: Secondary | ICD-10-CM

## 2015-06-08 DIAGNOSIS — R9389 Abnormal findings on diagnostic imaging of other specified body structures: Secondary | ICD-10-CM

## 2015-06-08 DIAGNOSIS — E1129 Type 2 diabetes mellitus with other diabetic kidney complication: Secondary | ICD-10-CM

## 2015-06-08 DIAGNOSIS — I429 Cardiomyopathy, unspecified: Secondary | ICD-10-CM | POA: Insufficient documentation

## 2015-06-08 DIAGNOSIS — E785 Hyperlipidemia, unspecified: Secondary | ICD-10-CM

## 2015-06-08 DIAGNOSIS — D509 Iron deficiency anemia, unspecified: Secondary | ICD-10-CM

## 2015-06-08 DIAGNOSIS — R938 Abnormal findings on diagnostic imaging of other specified body structures: Secondary | ICD-10-CM | POA: Diagnosis not present

## 2015-06-08 DIAGNOSIS — N189 Chronic kidney disease, unspecified: Secondary | ICD-10-CM

## 2015-06-08 DIAGNOSIS — E1165 Type 2 diabetes mellitus with hyperglycemia: Secondary | ICD-10-CM

## 2015-06-08 DIAGNOSIS — N179 Acute kidney failure, unspecified: Secondary | ICD-10-CM

## 2015-06-08 HISTORY — DX: Cardiomyopathy, unspecified: I42.9

## 2015-06-08 HISTORY — DX: Abnormal findings on diagnostic imaging of other specified body structures: R93.89

## 2015-06-08 MED ORDER — LOSARTAN POTASSIUM 100 MG PO TABS: 100.0000 mg | ORAL_TABLET | Freq: Every day | ORAL | Status: DC

## 2015-06-08 NOTE — Progress Notes (Signed)
Subjective:     Patient ID: Brandon Carroll, male   DOB: Feb 24, 1961, 55 y.o.   MRN: [id number]  HPI ~  June 08, 2015:  Initial pulmonary consult by SN>        44 y/o man referred by Harrison Mons PA, [facility] for a pulmonary evaluation to r/o Sarcoidosis;  He has a history of a cardiomyopathy, chronic systolic CHF w/ AF=79-03%, HBP, DM, HL, etc;  He was [facility] 12/19 - 05/05/15 by [facility] after presenting w/ dyspnea, PND & 2 pillow orthopnea;  CXR showed bilat effusions, some incr markings and prob LLL atx;  2DEcho showed a cardiomyopathy w/ EF=40-45%;  Labs showed elev BNP=971, mild renal insuffic w/ Cr=1.4-1.6, mild anemia w/ Hg=11-12 and small cells w/ MCV=76;  Pt was diuresed w/ Lasix & lost 7# of fluid w/ improvement in his breathing (also given empiric Levaquin rx) and he was disch for outpt follow up... CT Chest performed 05/04/15 showed scattered bilat pulm nodules (largest=20m in RUL) and mild adenopathy (R paratrach/ subcarinal/ aortopulm window/ & ?bilat hilar regions), norm heart size, coronary atherosclerosis noted, bilat effusions and basilar atx...        For his part the patient states that his SOB has completely resolved (w/ treatment of his CHF);  He notes min cough, small amt of clear sput, no hemoptysis, denies f/c/s, no CP;  He notes some DOE but able to perform his duties at [employer] & down stairs now...   Smoking Hx>  He is a never smoker...  Pulmonary Hx>  He has no prev hx of lung disease- no hx asthma, bronchitis, pneumonia, Tb or exposure.  Medical Hx>  As below-- HBP, CHF/cardiomyopathy (never seen by Cards), DM, HL, RI, anemia...  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  Current Meds>  AlbutHFA prn, Mucinex, Coreg, Calan, Lisinopril, Lasix, Mevacor, Glucotrol  EXAM shows Afeb, VSS, O2sat=98% on RA;  Heent- neg, mallampati2;  Chest- clear w/o w/r/r;  Heart-  RR w/o m/r/g;  Abd- soft, nontender, neg;  Ext- neg w/o c/c/e;  Neuro- intact w/o focal deficits...  CXR 05/02/15 showed bilat effusions, some incr markings and prob LLL atx  EKG 05/02/15 showed NSR, rate73/min, +STTWA- consider inferolat ischemia...  2DEcho 05/03/15 showed mild LVH, mod reduced LVF w/ EF=40-45% & diffuse HK; MV & AoV are wnl, LA is mod dilated, RA is mildly dilated  Labs in Epic from 04/2015 showed elev BNP=971, mild renal insuffic w/ Cr=1.4-1.6, mild anemia w/ Hg=11-12 and small cells w/ MCV=76   CT Chest performed 05/04/15 showed scattered bilat pulm nodules (largest=633min RUL) and mild adenopathy (R paratrach/ subcarinal/ aortopulm window/ & ?bilat hilar regions), norm heart size, coronary atherosclerosis noted, bilat effusions and basilar atx...    Spirometry 06/08/15 showed FVC=3.36 (81%), FEV1=2.62 (79%), %1sec=78, mid-flows are sl reduced at 61% predicted; This is c/w very mild airflow obstruction esp in small airways, can't r/o superimposed restriction w/o LV measurement.   Ambulatory Oximetry 06/08/15>  O2sat=100% on RA at rest w/ pulse=66/min; He ambulated 3 laps w/o difficulty, lowest O2sat=99% w/ pulse=74/min...   IMP >>    1) Abn CXR & CTChest w/ scattered bilat pulm nodules (largest=82m54mn RUL) and mild adenopathy seen (R paratrach/ subcarinal/ aortopulm window/ & ?bilat hilar regions)> the Adm CXR appeared to me to be c/w CHF w/ bilat effusions, interstitial edema, and some LLL atx (clinical  picture was not that of pneumonia w/o fever/ purulent sput, normal WBC ct, neg pneumococcal & legionella Ag, etc); radiology wondered about sarcoidosis, pt was on Lisinopril at time of Adm & therefore no ACE level checked => we switched Lisinopril to [id number] today...    2) HBP> controlled on Coreg 12.5- 1/2Bid, Calan120Bid, Lisinopril40, Lasix20-2Bid => we switched the ACE to ARB as above...    3) Cardiomyopathy w/ chronic systolic CHF and YP=95-09% by 2DEcho> BNP in hosp  was 971 & he diuresed 7+lbs of fluid w/ improvement in his breathing; he has not seen Cards & they didn't call consult in [place]; CTChest alluded to calcif in coronaries and he needs Cards consult for Cath and on-going treatment for his CHF.Marland KitchenMarland Kitchen     4) Hyperlipidemia> on Mevacor20 per PCP.Marland KitchenMarland Kitchen    5) Diabetes Mellitus> he has seen DrEllison in the past, A1c=7.0 04/2015 Hosp, urinalysis was pos for proteinuria; Prev on LANTUS & Metformin, now just on Glucotrol?    6) Renal insuffic> baseline Cr appears to be ~1.2-1.3, Cr in [place] was 1.4-1.6 w/ diuresis, then back to 1.3    7) Elev Alk Phos> this fractionated to liver but GOT/GPT are wnl    8) Anemia w/ small cells> Hg in Hosp was 11.9 w/ MCV=76; baseline in Epic ~12-13 w/o Iron studies or anemia eval previously done...   PLAN >>     [name] appears to have a major heart problem and needs to see Cardiology 1st- we will refer ASAP so they can manage his cardiomyopathy & eval for poss underlying CAD;  From the pulmonary perspective he is a never smoker & has no prior hx of any lung problem;  He has an abn CXR & CT Chest as noted, only mild PFT abnormality & I am not yet ready to commit him to a lung Bx or mediastinoscopy & bx to cement the dx of sarcoidosis;  For now we will switch his Lisinopril to Losartan (so we can check ACE level later) and we will plan ROV in 2-72mow/ CXR at that time (he will need f/u CT at a later date)    Past Medical History  Diagnosis Date  . Diabetes mellitus   . Hypertension   . Erectile dysfunction   . Hyperlipidemia     Past Surgical History  Procedure Laterality Date  . Hernia repair  7th grade    umbilical    Outpatient Encounter Prescriptions as of 06/08/2015  Medication Sig  . Albuterol Sulfate (PROAIR RESPICLICK) 1326(90 BASE) MCG/ACT AEPB Inhale 2 puffs into the lungs every 6 (six) hours as needed. (Patient taking differently: Inhale 2 puffs into the lungs every 6 (six) hours as needed (shortness of breathe).  )  . carvedilol (COREG) 12.5 MG tablet Take 0.5 tablets (6.25 mg total) by mouth 2 (two) times daily with a meal.  . furosemide (LASIX) 20 MG tablet Take 2 tablets (40 mg total) by mouth 2 (two) times daily. Weigh daily. When weight is 166 or less, reduce to 1 tablet BID. (Patient taking differently: Take 20 mg by mouth 2 (two) times daily. Weigh daily. When weight is 166 or less, reduce to 1 tablet BID.)  . glipiZIDE (GLUCOTROL) 10 MG tablet Take 1 tablet (10 mg total) by mouth 2 (two) times daily before a meal.  . lovastatin (MEVACOR) 20 MG tablet Take 1 tablet (20 mg total) by mouth daily.  . verapamil (CALAN) 120 MG tablet Take 1 tablet (120 mg total) by mouth  2 (two) times daily.  . [DISCONTINUED] lisinopril (PRINIVIL,ZESTRIL) 40 MG tablet Take 1 tablet (40 mg total) by mouth daily.  Marland Kitchen guaiFENesin (MUCINEX) 600 MG 12 hr tablet Take 1 tablet (600 mg total) by mouth 2 (two) times daily. (Patient not taking: Reported on 06/08/2015)  . losartan (COZAAR) 100 MG tablet Take 1 tablet (100 mg total) by mouth daily.   No facility-administered encounter medications on file as of 06/08/2015.    No Known Allergies   Immunization History  Administered Date(s) Administered  . Influenza, Seasonal, Injecte, Preservative Fre 07/22/2012  . Influenza,inj,Quad PF,36+ Mos 08/04/2014  . Pneumococcal Polysaccharide-23 07/22/2012, 05/03/2015  . Tdap 05/14/2010    Family History  Problem Relation Age of Onset  . Arthritis Mother   . COPD Mother   . Diabetes Mother     was thin, took insulin  . Hypertension Mother   . Hypertension Father   . Arthritis Sister     rheumatoid  . COPD Sister   . Diabetes Brother   . Colon cancer Neg Hx   Father died w/ MI at age 105 Mother died in her 40's "she had everything" meaning HBP, DM, DJD... 7Sibs- 1Bro w/ brain cancer, 1Sis w/ COPD & MS, Others w/ HBP, DM...   Social History   Social History  . Marital Status: Divorced    Spouse Name: n/a  . Number of  Children: 1  . Years of Education: 12+   Occupational History  . Animal nutritionist    Social History Main Topics  . Smoking status: Never Smoker   . Smokeless tobacco: Never Used  . Alcohol Use: No  . Drug Use: No  . Sexual Activity: Not Currently     Comment: Erectile dysfunciotn   Other Topics Concern  . Not on file   Social History Narrative   Divorced. Education: [employer]. Exercise: walking 5 days a week for 45 minutes   Lives alone.   Son lives in [place].    Current Medications, Allergies, Past Medical History, Past Surgical History, Family History, and Social History were reviewed in [employer] record.   Review of Systems             All symptoms NEG except where BOLDED >>  Constitutional:  F/C/S, fatigue, anorexia, unexpected weight change. HEENT:  HA, visual changes, hearing loss, earache, nasal symptoms, sore throat, mouth sores, hoarseness. Resp:  cough, sputum, hemoptysis; SOB, tightness, wheezing. Cardio:  CP, palpit, DOE, orthopnea, edema. GI:  N/V/D/C, blood in stool; reflux, abd pain, distention, gas. GU:  dysuria, freq, urgency, hematuria, flank pain, voiding difficulty. MS:  joint pain, swelling, tenderness, decr ROM; neck pain, back pain, etc. Neuro:  HA, tremors, seizures, dizziness, syncope, weakness, numbness, gait abn. Skin:  suspicious lesions or skin rash. Heme:  adenopathy, bruising, bleeding. Psyche:  confusion, agitation, sleep disturbance, hallucinations, anxiety, depression suicidal.   Objective:   Physical Exam       Vital Signs:  Reviewed...  General:  WD, WN,    y/o WM in NAD; alert & oriented; pleasant & cooperative... HEENT:  /AT; Conjunctiva- pink, Sclera- nonicteric, EOM-wnl, PERRLA, Fundi-benign; EACs-clear, TMs-wnl; NOSE-clear; THROAT-clear & wnl. Neck:  Supple w/ full ROM; no JVD; normal carotid impulses w/o bruits; no thyromegaly or nodules palpated; no lymphadenopathy. Chest:  Clear to P & A; without  wheezes, rales, or rhonchi heard. Heart:  Regular Rhythm; norm S1 & S2 without murmurs, rubs, or gallops detected. Abdomen:  Soft & nontender- no guarding or  rebound; normal bowel sounds; no organomegaly or masses palpated.   Ext:  Normal ROM; without deformities or arthritic changes; no varicose veins, venous insuffic, or edema;  Pulses intact w/o bruits. Neuro:  CNs II-XII intact; motor testing normal; sensory testing normal; gait normal & balance OK. Derm:  No lesions noted; no rash etc. Lymph:  No cervical, supraclavicular, axillary, or inguinal adenopathy palpated.   Assessment:      IMP >>    1) Abn CXR & CTChest w/ scattered bilat pulm nodules (largest=78m in RUL) and mild adenopathy seen (R paratrach/ subcarinal/ aortopulm window/ & ?bilat hilar regions)> the Adm CXR appeared to me to be c/w CHF w/ bilat effusions, interstitial edema, and some LLL atx (clinical picture was not that of pneumonia w/o fever/ purulent sput, normal WBC ct, neg pneumococcal & legionella Ag, etc); radiology wondered about sarcoidosis, pt was on Lisinopril at time of Adm & therefore no ACE level checked => we switched Lisinopril to [id number] today...    2) HBP> controlled on Coreg 12.5- 1/2Bid, Calan120Bid, Lisinopril40, Lasix20-2Bid => we switched the ACE to ARB as above...    3) Cardiomyopathy w/ chronic systolic CHF and EDH=74-16%by 2DEcho> BNP in hosp was 971 & he diuresed 7+lbs of fluid w/ improvement in his breathing; he has not seen Cards & they didn't call consult in [place] CTChest alluded to calcif in coronaries and he needs Cards consult for Cath and on-going treatment for his CHF..Marland KitchenMarland Kitchen    4) Hyperlipidemia> on Mevacor20 per PCP..Marland KitchenMarland Kitchen   5) Diabetes Mellitus> he has seen DrEllison in the past, A1c=7.0 04/2015 Hosp, urinalysis was pos for proteinuria; Prev on LANTUS & Metformin, now just on Glucotrol?    6) Renal insuffic> baseline Cr appears to be ~1.2-1.3, [place] 1.4[place]1.6 w/ diuresis, then back to  1.3    7) Elev Alk Phos> this fractionated to liver but GOT/GPT are wnl    8) Anemia w/ small cells> Hg in Hosp was 11.9 w/ MCV=76; baseline in Epic ~12-13 w/o Iron studies or anemia eval previously done...   PLAN >>     [name] appears to have a major heart problem and needs to see Cardiology 1st- we will refer ASAP so they can manage his cardiomyopathy & eval for poss underlying CAD;  From the pulmonary perspective he is a never smoker & has no prior hx of any lung problem;  He has an abn CXR & CT Chest as noted, only mild PFT abnormality & I am not yet ready to commit him to a lung Bx or mediastinoscopy & bx to cement the dx of sarcoidosis;  For now we will switch his Lisinopril to Losartan (so we can check ACE level later) and we will plan ROV in 2-333mo/ CXR at that time (he will need f/u CT at a later date)     Plan:     Patient's Medications  New Prescriptions   LOSARTAN (COZAAR) 100 MG TABLET    Take 1 tablet (100 mg total) by mouth daily.  Previous Medications   ALBUTEROL SULFATE (PROAIR RESPICLICK) 1038490 BASE) MCG/ACT AEPB    Inhale 2 puffs into the lungs every 6 (six) hours as needed.   CARVEDILOL (COREG) 12.5 MG TABLET    Take 0.5 tablets (6.25 mg total) by mouth 2 (two) times daily with a meal.   FUROSEMIDE (LASIX) 20 MG TABLET    Take 2 tablets (40 mg total) by mouth 2 (two) times daily. Weigh  daily. When weight is 166 or less, reduce to 1 tablet BID.   GLIPIZIDE (GLUCOTROL) 10 MG TABLET    Take 1 tablet (10 mg total) by mouth 2 (two) times daily before a meal.   GUAIFENESIN (MUCINEX) 600 MG 12 HR TABLET    Take 1 tablet (600 mg total) by mouth 2 (two) times daily.   LOVASTATIN (MEVACOR) 20 MG TABLET    Take 1 tablet (20 mg total) by mouth daily.   VERAPAMIL (CALAN) 120 MG TABLET    Take 1 tablet (120 mg total) by mouth 2 (two) times daily.  Modified Medications   No medications on file  Discontinued Medications   LISINOPRIL (PRINIVIL,ZESTRIL) 40 MG TABLET    Take 1 tablet  (40 mg total) by mouth daily.

## 2015-06-08 NOTE — Patient Instructions (Signed)
Brandon Carroll-- it was great meeting you today...  We discussed your recent evaluation including your CXR & CT Chest scan showing several tiny nodules and the enlarged lymph nodes suggestive of a condition called Sarcoidosis...    Today we checked a breathing test and an oxygen saturation test...    We are making one medication change today>>       Stop your current Lisinopril med & start the new LOSARTAN 100mg  tab- one tab daily...  The most important thing now is to have the needed CARDIAC evaluation first...    Once that is complete & your heart pump function is improved,     then we will recommend further testing to rule out the sarcoidosis...  Let's plan a follow up visit in 36mo, sooner if needed for breathing problems.Marland KitchenMarland Kitchen

## 2015-06-09 ENCOUNTER — Encounter: Payer: Self-pay | Admitting: *Deleted

## 2015-06-09 NOTE — Progress Notes (Signed)
Cardiology Office Note   Date:  06/10/2015   ID:  Brandon Carroll, DOB Sep 22, 1960, MRN [id number]  PCP:  JEFFERY,CHELLE, PA-C  Cardiologist:   Sharol Harness, MD   Chief Complaint  Patient presents with  . New Evaluation    ACUTE SYSTOLIC HEART FAILURE.  Brandon Carroll Shortness of Breath  . Edema    IN HIS LEGS WHILE IN THE HOSPITAL.      History of Present Illness: Brandon Carroll is a 55 y.o. male with hypertension, diabetes mellitus, chronic kidney disease, chronic systolic heart failure, and hyperlipidemia who presents for management of heart failure.  Brandon Carroll presented to the ED on 12/14 with a complaint of productive cough.  He also noted shortness of breath that started 2-3 weeks prior to admission.  He endorsed two pillow orthopnea and exertional shortness of breath.  He was found to have pneumonia and new onset systolic heart failure.  Chest CT was concerning for sarcoidosis and revealed an incidental finding of calcification of two coronary arteries. His LVEF was 45% on echo.  He was started on lasix and discharged at a weight of 166 lb.  He lost 7 lb during that hospitalization.  He followed up with his PCP on 1/23, at which time lisinopril was increased to 40 mg due to poorly-controlled hypertension.  Since leaving the hospital he has been doing well.  At discharge his breathing was much better but he started developing some increased shortness of breath  His weight also increased to 170 lb. He saw his PCP who recommended that he increase lasix to 40 mg bid and referred him to cardiology for further evaluation.  Brandon Carroll has reduced the sodium in his diet and has not noted any lower extremity edema.  He denies orthopnea or PND.  He also denies chest pain.  However, he has been feeling very fatigued, especially with exertion.   Brandon Carroll family died of an MI at age 36.  Past Medical History  Diagnosis Date  . Diabetes mellitus   . Hypertension   . Erectile  dysfunction   . Hyperlipidemia     Past Surgical History  Procedure Laterality Date  . Hernia repair  7th grade    umbilical     Current Outpatient Prescriptions  Medication Sig Dispense Refill  . Albuterol Sulfate (PROAIR RESPICLICK) [id number] (90 BASE) MCG/ACT AEPB Inhale 2 puffs into the lungs every 6 (six) hours as needed. (Patient taking differently: Inhale 2 puffs into the lungs every 6 (six) hours as needed (shortness of breathe). ) 1 each 3  . furosemide (LASIX) 20 MG tablet Take 2 tablets (40 mg total) by mouth 2 (two) times daily. Weigh daily. When weight is 166 or less, reduce to 1 tablet BID. (Patient taking differently: Take 20 mg by mouth 2 (two) times daily. Weigh daily. When weight is 166 or less, reduce to 1 tablet BID.) 120 tablet 0  . glipiZIDE (GLUCOTROL) 10 MG tablet Take 1 tablet (10 mg total) by mouth 2 (two) times daily before a meal. 60 tablet 1  . guaiFENesin (MUCINEX) 600 MG 12 hr tablet Take 1 tablet (600 mg total) by mouth 2 (two) times daily. 20 tablet 0  . losartan (COZAAR) 100 MG tablet Take 1 tablet (100 mg total) by mouth daily. 30 tablet 3  . lovastatin (MEVACOR) 20 MG tablet Take 1 tablet (20 mg total) by mouth daily. 30 tablet 5  . carvedilol (COREG) 25 MG tablet Take 1 tablet (25 mg  total) by mouth 2 (two) times daily. 60 tablet 11   No current facility-administered medications for this visit.    Allergies:   Review of patient's allergies indicates no known allergies.    Social History:  The patient  reports that he has never smoked. He has never used smokeless tobacco. He reports that he does not drink alcohol or use illicit drugs.   Family History:  The patient's family history includes Arthritis in his mother and sister; COPD in his mother and sister; Diabetes in his brother and mother; Hypertension in his father and mother. There is no history of Colon cancer.    ROS:  Please see the history of present illness.   Otherwise, review of systems are  positive for none.   All other systems are reviewed and negative.    PHYSICAL EXAM: VS:  BP 154/100 mmHg  Pulse 72  Ht 5\' 10"  (1.778 m)  Wt 78.926 kg (174 lb)  BMI 24.97 kg/m2 , BMI Body mass index is 24.97 kg/(m^2). GENERAL:  Well appearing HEENT:  Pupils equal round and reactive, fundi not visualized, oral mucosa unremarkable NECK:  No jugular venous distention, waveform within normal limits, carotid upstroke brisk and symmetric, no bruits, no thyromegaly LYMPHATICS:  No cervical adenopathy LUNGS:  Clear to auscultation bilaterally HEART:  RRR.  PMI not displaced or sustained,S1 and S2 within normal limits, no S3, no S4, no clicks, no rubs, no murmurs ABD:  Flat, positive bowel sounds normal in frequency in pitch, no bruits, no rebound, no guarding, no midline pulsatile mass, no hepatomegaly, no splenomegaly EXT:  2 plus pulses throughout, no edema, no cyanosis no clubbing SKIN:  No rashes no nodules NEURO:  Cranial nerves II through XII grossly intact, motor grossly intact throughout PSYCH:  Cognitively intact, oriented to person place and time   EKG:  EKG is ordered today. The ekg ordered today demonstrates sinus rhythm rate 72 bpm.  LVH with repolaraization abnormalities.   Recent Labs: 05/02/2015: B Natriuretic Peptide 970.6* 06/01/2015: ALT 28; TSH 1.770 06/10/2015: BUN 17; Creat 1.51*; Hemoglobin 11.8*; Platelets 266; Potassium 4.6; Sodium 140    Lipid Panel    Component Value Date/Time   CHOL 203* 05/03/2015 0440   TRIG 76 05/03/2015 0440   HDL 69 05/03/2015 0440   CHOLHDL 2.9 05/03/2015 0440   VLDL 15 05/03/2015 0440   LDLCALC 119* 05/03/2015 0440      Wt Readings from Last 3 Encounters:  06/10/15 78.926 kg (174 lb)  06/08/15 78.2 kg (172 lb 6.4 oz)  06/06/15 77.111 kg (170 lb)      ASSESSMENT AND PLAN:  # Chronic systolic heart failure: Mr. Brandon Carroll has newly-diagnosed heart failure.  The etiology is not yet known.  He needs an ischemia evaluation.  It  is quite possible that his symptoms are due to poorly-controlled hypertension and diabetes.  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  We will refer him for left heart catheterization given his several risk factors for CAD, including hypertension, hyperlipidemia, and family history of premature CAD.  Increase carvedilol to 25 mg daily.  Continue losartan 100 mg daily and lasix.  We discussed the importance of fluid and salt restriction.  Ideally he should be started on Entresto.  However, given that we are making two medication changes, we will wait to make that change at his next appointment.  # Hypertensive heart disease: Blood pressure is poorly-controlled.  We will increase carvedilol to  25 mg bid and continue losartan.  Stop verapamil given that he has systolic heart failure.  Mr. Murzyn will check his BP at home and let us know if it is >140/90 mmHg.   # Hyperlipidemia: Mr. Kukura has diabetes and is taking lovastatin.  LDL was 119 in the hospital.  We will  check his lipids at his follow up appointment and likely switch to either atorvastatin or rosuvastatin.  # CV Disease Prevention:  ASCVD 10 year risk is 22%.  Therefore, we will start aspirin 81 mg daily. Aspirin 81  Current medicines are reviewed at length with the patient today.  The patient does not have concerns regarding medicines.  The following changes have been made:  Increase carvedilol.  Stop verapamil.  Start aspirin 81 mg daily..  Labs/ tests ordered today include:   Orders Placed This Encounter  Procedures  . APTT  . Basic metabolic panel  . CBC  . Protime-INR  . EKG 12-Lead  . LEFT HEART CATHETERIZATION WITH CORONARY ANGIOGRAM     Disposition:   FU with Maleia Weems C. Oval Linsey, MD, [facility] in 1 month   This note was written with the assistance of speech recognition software.  Please excuse any transcriptional errors.  Signed, Vencent Hauschild C. Oval Linsey,  MD, [facility]  06/10/2015 10:51 PM    [place]

## 2015-06-10 ENCOUNTER — Encounter: Payer: Self-pay | Admitting: Cardiovascular Disease

## 2015-06-10 ENCOUNTER — Ambulatory Visit (INDEPENDENT_AMBULATORY_CARE_PROVIDER_SITE_OTHER): Payer: BLUE CROSS/BLUE SHIELD | Admitting: Cardiovascular Disease

## 2015-06-10 VITALS — BP 154/100 | HR 72 | Ht 70.0 in | Wt 174.0 lb

## 2015-06-10 DIAGNOSIS — D689 Coagulation defect, unspecified: Secondary | ICD-10-CM | POA: Diagnosis not present

## 2015-06-10 DIAGNOSIS — I110 Hypertensive heart disease with heart failure: Secondary | ICD-10-CM

## 2015-06-10 DIAGNOSIS — Z01818 Encounter for other preprocedural examination: Secondary | ICD-10-CM | POA: Diagnosis not present

## 2015-06-10 DIAGNOSIS — I5022 Chronic systolic (congestive) heart failure: Secondary | ICD-10-CM | POA: Diagnosis not present

## 2015-06-10 DIAGNOSIS — E785 Hyperlipidemia, unspecified: Secondary | ICD-10-CM

## 2015-06-10 LAB — CBC
HCT: 36.5 % — ABNORMAL LOW (ref 39.0–52.0)
HEMOGLOBIN: 11.8 g/dL — AB (ref 13.0–17.0)
MCH: 25.2 pg — ABNORMAL LOW (ref 26.0–34.0)
MCHC: 32.3 g/dL (ref 30.0–36.0)
MCV: 77.8 fL — ABNORMAL LOW (ref 78.0–100.0)
MPV: 10.2 fL (ref 8.6–12.4)
Platelets: 266 10*3/uL (ref 150–400)
RBC: 4.69 MIL/uL (ref 4.22–5.81)
RDW: 15.3 % (ref 11.5–15.5)
WBC: 6.4 10*3/uL (ref 4.0–10.5)

## 2015-06-10 LAB — BASIC METABOLIC PANEL
BUN: 17 mg/dL (ref 7–25)
CALCIUM: 8.6 mg/dL (ref 8.6–10.3)
CO2: 25 mmol/L (ref 20–31)
Chloride: 104 mmol/L (ref 98–110)
Creat: 1.51 mg/dL — ABNORMAL HIGH (ref 0.70–1.33)
GLUCOSE: 161 mg/dL — AB (ref 65–99)
POTASSIUM: 4.6 mmol/L (ref 3.5–5.3)
SODIUM: 140 mmol/L (ref 135–146)

## 2015-06-10 LAB — PROTIME-INR
INR: 0.97 (ref <1.50)
PROTHROMBIN TIME: 13 s (ref 11.6–15.2)

## 2015-06-10 LAB — APTT: APTT: 31 s (ref 24–37)

## 2015-06-10 MED ORDER — CARVEDILOL 25 MG PO TABS: 25.0000 mg | ORAL_TABLET | Freq: Two times a day (BID) | ORAL | Status: DC

## 2015-06-10 NOTE — Patient Instructions (Signed)
Your physician has requested that you have a LEFT HEART cardiac catheterization . Cardiac catheterization is used to diagnose and/or treat various heart conditions. Doctors may recommend this procedure for a number of different reasons. The most common reason is to evaluate chest pain. Chest pain can be a symptom of coronary artery disease (CAD), and cardiac catheterization can show whether plaque is narrowing or blocking your heart's arteries. This procedure is also used to evaluate the valves, as well as measure the blood flow and oxygen levels in different parts of your heart. For further information please visit [email or web address]. Please follow instruction sheet, as given. LABS PTT,PT,BMP,CBC NO XRAY NEEDED.  STOP VERAPAMIL  STOP CARVEDILOL 12.5 MG--- START - INCREASE TO CARVEDILOL  25 MG  TWICE A DAY START ASPRIN 81 MG ONE TABLET DAILY.  Your physician recommends that you schedule a follow-up appointment in [place]- F/U CATH

## 2015-06-13 ENCOUNTER — Telehealth: Payer: Self-pay | Admitting: *Deleted

## 2015-06-13 ENCOUNTER — Other Ambulatory Visit: Payer: Self-pay | Admitting: *Deleted

## 2015-06-13 ENCOUNTER — Encounter: Payer: Self-pay | Admitting: *Deleted

## 2015-06-13 DIAGNOSIS — I5022 Chronic systolic (congestive) heart failure: Secondary | ICD-10-CM

## 2015-06-13 DIAGNOSIS — Z01818 Encounter for other preprocedural examination: Secondary | ICD-10-CM

## 2015-06-13 NOTE — Telephone Encounter (Signed)
Spoke to patient. Result given . Verbalized understanding PATIENT AWARE TO HOLD LOSARTAN , LASIX PATIENT WILL BE SEEING PRIMARY 06/21/15

## 2015-06-13 NOTE — Telephone Encounter (Signed)
-----   Message from Skeet Latch, MD sent at 06/13/2015  4:05 PM EST ----- Kidney function remains slightly elevated but stable.  He also remains mildly anemic.  Continue current meds and follow up with PCP regarding anemia.

## 2015-06-13 NOTE — Telephone Encounter (Signed)
Reviewed with Dr Dorothey Baseman FOR CATH 06/15/15  PER DR [place] , HOLD LOSARTAN AND LASIX  DAY OF PROCEDURE. LEFT MESSAGE FOR PATIENT TO CAL BACK

## 2015-06-15 ENCOUNTER — Ambulatory Visit (HOSPITAL_COMMUNITY)
Admission: RE | Admit: 2015-06-15 | Discharge: 2015-06-15 | Disposition: A | Discharge status: Home / Self Care | Payer: BLUE CROSS/BLUE SHIELD | Source: Ambulatory Visit | Attending: Cardiology | Admitting: Cardiology

## 2015-06-15 ENCOUNTER — Encounter (HOSPITAL_COMMUNITY)
Admission: RE | Disposition: A | Discharge status: Home / Self Care | Payer: BLUE CROSS/BLUE SHIELD | Source: Ambulatory Visit | Attending: Cardiology

## 2015-06-15 ENCOUNTER — Encounter (HOSPITAL_COMMUNITY): Payer: Self-pay | Admitting: Cardiology

## 2015-06-15 DIAGNOSIS — E1122 Type 2 diabetes mellitus with diabetic chronic kidney disease: Secondary | ICD-10-CM | POA: Diagnosis not present

## 2015-06-15 DIAGNOSIS — I2510 Atherosclerotic heart disease of native coronary artery without angina pectoris: Secondary | ICD-10-CM | POA: Diagnosis not present

## 2015-06-15 DIAGNOSIS — N189 Chronic kidney disease, unspecified: Secondary | ICD-10-CM | POA: Diagnosis not present

## 2015-06-15 DIAGNOSIS — Z01818 Encounter for other preprocedural examination: Secondary | ICD-10-CM

## 2015-06-15 DIAGNOSIS — E1129 Type 2 diabetes mellitus with other diabetic kidney complication: Secondary | ICD-10-CM | POA: Diagnosis present

## 2015-06-15 DIAGNOSIS — Z8249 Family history of ischemic heart disease and other diseases of the circulatory system: Secondary | ICD-10-CM | POA: Insufficient documentation

## 2015-06-15 DIAGNOSIS — I429 Cardiomyopathy, unspecified: Secondary | ICD-10-CM | POA: Diagnosis not present

## 2015-06-15 DIAGNOSIS — I130 Hypertensive heart and chronic kidney disease with heart failure and stage 1 through stage 4 chronic kidney disease, or unspecified chronic kidney disease: Secondary | ICD-10-CM | POA: Diagnosis not present

## 2015-06-15 DIAGNOSIS — I5022 Chronic systolic (congestive) heart failure: Secondary | ICD-10-CM | POA: Diagnosis present

## 2015-06-15 DIAGNOSIS — E1169 Type 2 diabetes mellitus with other specified complication: Secondary | ICD-10-CM | POA: Diagnosis present

## 2015-06-15 DIAGNOSIS — N184 Chronic kidney disease, stage 4 (severe): Secondary | ICD-10-CM | POA: Diagnosis present

## 2015-06-15 DIAGNOSIS — E785 Hyperlipidemia, unspecified: Secondary | ICD-10-CM | POA: Diagnosis not present

## 2015-06-15 HISTORY — DX: Chronic systolic (congestive) heart failure: I50.22

## 2015-06-15 HISTORY — PX: CARDIAC CATHETERIZATION: SHX172

## 2015-06-15 LAB — GLUCOSE, CAPILLARY
GLUCOSE-CAPILLARY: 166 mg/dL — AB (ref 65–99)
Glucose-Capillary: 142 mg/dL — ABNORMAL HIGH (ref 65–99)

## 2015-06-15 SURGERY — LEFT HEART CATH AND CORONARY ANGIOGRAPHY
Anesthesia: LOCAL

## 2015-06-15 MED ORDER — HYDRALAZINE HCL 20 MG/ML IJ SOLN
10.0000 mg | Freq: Once | INTRAMUSCULAR | Status: AC
  Administered 2015-06-15: 10 mg via INTRAVENOUS

## 2015-06-15 MED ORDER — VERAPAMIL HCL 2.5 MG/ML IV SOLN
INTRAVENOUS | Status: DC | PRN
  Administered 2015-06-15: 10:00:00 via INTRA_ARTERIAL

## 2015-06-15 MED ORDER — HEPARIN SODIUM (PORCINE) 1000 UNIT/ML IJ SOLN
INTRAMUSCULAR | Status: DC | PRN
  Administered 2015-06-15: 4000 [IU] via INTRAVENOUS

## 2015-06-15 MED ORDER — MIDAZOLAM HCL 2 MG/2ML IJ SOLN
INTRAMUSCULAR | Status: DC | PRN
  Administered 2015-06-15: 1 mg via INTRAVENOUS

## 2015-06-15 MED ORDER — HYDRALAZINE HCL 20 MG/ML IJ SOLN
INTRAMUSCULAR | Status: AC
  Filled 2015-06-15: qty 1

## 2015-06-15 MED ORDER — SODIUM CHLORIDE 0.9% FLUSH: 3.0000 mL | Freq: Two times a day (BID) | INTRAVENOUS | Status: DC

## 2015-06-15 MED ORDER — HYDRALAZINE HCL 20 MG/ML IJ SOLN
INTRAMUSCULAR | Status: AC
  Administered 2015-06-15: 20 mg
  Filled 2015-06-15: qty 1

## 2015-06-15 MED ORDER — LIDOCAINE HCL (PF) 1 % IJ SOLN
INTRAMUSCULAR | Status: DC | PRN
  Administered 2015-06-15: 2 mL via SUBCUTANEOUS

## 2015-06-15 MED ORDER — SODIUM CHLORIDE 0.9 % IV SOLN: 250.0000 mL | INTRAVENOUS | Status: DC | PRN

## 2015-06-15 MED ORDER — SODIUM CHLORIDE 0.9% FLUSH: 3.0000 mL | INTRAVENOUS | Status: DC | PRN

## 2015-06-15 MED ORDER — HYDRALAZINE HCL 20 MG/ML IJ SOLN
INTRAMUSCULAR | Status: DC | PRN
  Administered 2015-06-15: 10 mg via INTRAVENOUS

## 2015-06-15 MED ORDER — SODIUM CHLORIDE 0.9 % IV SOLN
INTRAVENOUS | Status: DC
  Administered 2015-06-15: 08:00:00 via INTRAVENOUS

## 2015-06-15 MED ORDER — LIDOCAINE HCL (PF) 1 % IJ SOLN
INTRAMUSCULAR | Status: AC
  Filled 2015-06-15: qty 30

## 2015-06-15 MED ORDER — IOHEXOL 350 MG/ML SOLN
INTRAVENOUS | Status: DC | PRN
  Administered 2015-06-15: 55 mL via INTRACARDIAC

## 2015-06-15 MED ORDER — HEPARIN (PORCINE) IN NACL 2-0.9 UNIT/ML-% IJ SOLN
INTRAMUSCULAR | Status: AC
  Filled 2015-06-15: qty 1500

## 2015-06-15 MED ORDER — SODIUM CHLORIDE 0.9 % WEIGHT BASED INFUSION: 1.0000 mL/kg/h | INTRAVENOUS | Status: DC

## 2015-06-15 MED ORDER — VERAPAMIL HCL 2.5 MG/ML IV SOLN
INTRAVENOUS | Status: AC
  Filled 2015-06-15: qty 2

## 2015-06-15 MED ORDER — FENTANYL CITRATE (PF) 100 MCG/2ML IJ SOLN
INTRAMUSCULAR | Status: DC | PRN
  Administered 2015-06-15: 25 ug via INTRAVENOUS

## 2015-06-15 MED ORDER — MIDAZOLAM HCL 2 MG/2ML IJ SOLN
INTRAMUSCULAR | Status: AC
  Filled 2015-06-15: qty 2

## 2015-06-15 MED ORDER — HEPARIN SODIUM (PORCINE) 1000 UNIT/ML IJ SOLN
INTRAMUSCULAR | Status: AC
  Filled 2015-06-15: qty 1

## 2015-06-15 MED ORDER — FENTANYL CITRATE (PF) 100 MCG/2ML IJ SOLN
INTRAMUSCULAR | Status: AC
  Filled 2015-06-15: qty 2

## 2015-06-15 SURGICAL SUPPLY — 9 items
CATH INFINITI 5 FR JL3.5 (CATHETERS) ×2 IMPLANT
CATH INFINITI JR4 5F (CATHETERS) ×2 IMPLANT
DEVICE RAD COMP TR BAND LRG (VASCULAR PRODUCTS) ×2 IMPLANT
GLIDESHEATH SLEND SS 6F .021 (SHEATH) ×2 IMPLANT
KIT HEART LEFT (KITS) ×2 IMPLANT
PACK CARDIAC CATHETERIZATION (CUSTOM PROCEDURE TRAY) ×2 IMPLANT
TRANSDUCER W/STOPCOCK (MISCELLANEOUS) ×2 IMPLANT
TUBING CIL FLEX 10 FLL-RA (TUBING) ×2 IMPLANT
WIRE SAFE-T 1.5MM-J .035X260CM (WIRE) ×2 IMPLANT

## 2015-06-15 NOTE — H&P (View-Only) (Signed)
Cardiology Office Note   Date:  06/10/2015   ID:  Tracie Suchecki, DOB 12/17/1960, MRN [id number]  PCP:  JEFFERY,CHELLE, PA-C  Cardiologist:   Sharol Harness, MD   Chief Complaint  Patient presents with  . New Evaluation    ACUTE SYSTOLIC HEART FAILURE.  Marland Kitchen Shortness of Breath  . Edema    IN HIS LEGS WHILE IN THE HOSPITAL.      History of Present Illness: Brandon Carroll is a 55 y.o. male with hypertension, diabetes mellitus, chronic kidney disease, chronic systolic heart failure, and hyperlipidemia who presents for management of heart failure.  Brandon Carroll presented to the ED on 12/14 with a complaint of productive cough.  He also noted shortness of breath that started 2-3 weeks prior to admission.  He endorsed two pillow orthopnea and exertional shortness of breath.  He was found to have pneumonia and new onset systolic heart failure.  Chest CT was concerning for sarcoidosis and revealed an incidental finding of calcification of two coronary arteries. His LVEF was 45% on echo.  He was started on lasix and discharged at a weight of 166 lb.  He lost 7 lb during that hospitalization.  He followed up with his PCP on 1/23, at which time lisinopril was increased to 40 mg due to poorly-controlled hypertension.  Since leaving the hospital he has been doing well.  At discharge his breathing was much better but he started developing some increased shortness of breath  His weight also increased to 170 lb. He saw his PCP who recommended that he increase lasix to 40 mg bid and referred him to cardiology for further evaluation.  Brandon Carroll has reduced the sodium in his diet and has not noted any lower extremity edema.  He denies orthopnea or PND.  He also denies chest pain.  However, he has been feeling very fatigued, especially with exertion.   Brandon Carroll family died of an MI at age 58.  Past Medical History  Diagnosis Date  . Diabetes mellitus   . Hypertension   . Erectile  dysfunction   . Hyperlipidemia     Past Surgical History  Procedure Laterality Date  . Hernia repair  7th grade    umbilical     Current Outpatient Prescriptions  Medication Sig Dispense Refill  . Albuterol Sulfate (PROAIR RESPICLICK) 123XX123 (90 BASE) MCG/ACT AEPB Inhale 2 puffs into the lungs every 6 (six) hours as needed. (Patient taking differently: Inhale 2 puffs into the lungs every 6 (six) hours as needed (shortness of breathe). ) 1 each 3  . furosemide (LASIX) 20 MG tablet Take 2 tablets (40 mg total) by mouth 2 (two) times daily. Weigh daily. When weight is 166 or less, reduce to 1 tablet BID. (Patient taking differently: Take 20 mg by mouth 2 (two) times daily. Weigh daily. When weight is 166 or less, reduce to 1 tablet BID.) 120 tablet 0  . glipiZIDE (GLUCOTROL) 10 MG tablet Take 1 tablet (10 mg total) by mouth 2 (two) times daily before a meal. 60 tablet 1  . guaiFENesin (MUCINEX) 600 MG 12 hr tablet Take 1 tablet (600 mg total) by mouth 2 (two) times daily. 20 tablet 0  . losartan (COZAAR) 100 MG tablet Take 1 tablet (100 mg total) by mouth daily. 30 tablet 3  . lovastatin (MEVACOR) 20 MG tablet Take 1 tablet (20 mg total) by mouth daily. 30 tablet 5  . carvedilol (COREG) 25 MG tablet Take 1 tablet (25 mg  total) by mouth 2 (two) times daily. 60 tablet 11   No current facility-administered medications for this visit.    Allergies:   Review of patient's allergies indicates no known allergies.    Social History:  The patient  reports that he has never smoked. He has never used smokeless tobacco. He reports that he does not drink alcohol or use illicit drugs.   Family History:  The patient's family history includes Arthritis in his mother and sister; COPD in his mother and sister; Diabetes in his brother and mother; Hypertension in his father and mother. There is no history of Colon cancer.    ROS:  Please see the history of present illness.   Otherwise, review of systems are  positive for none.   All other systems are reviewed and negative.    PHYSICAL EXAM: VS:  BP 154/100 mmHg  Pulse 72  Ht 5\' 10"  (1.778 m)  Wt 78.926 kg (174 lb)  BMI 24.97 kg/m2 , BMI Body mass index is 24.97 kg/(m^2). GENERAL:  Well appearing HEENT:  Pupils equal round and reactive, fundi not visualized, oral mucosa unremarkable NECK:  No jugular venous distention, waveform within normal limits, carotid upstroke brisk and symmetric, no bruits, no thyromegaly LYMPHATICS:  No cervical adenopathy LUNGS:  Clear to auscultation bilaterally HEART:  RRR.  PMI not displaced or sustained,S1 and S2 within normal limits, no S3, no S4, no clicks, no rubs, no murmurs ABD:  Flat, positive bowel sounds normal in frequency in pitch, no bruits, no rebound, no guarding, no midline pulsatile mass, no hepatomegaly, no splenomegaly EXT:  2 plus pulses throughout, no edema, no cyanosis no clubbing SKIN:  No rashes no nodules NEURO:  Cranial nerves II through XII grossly intact, motor grossly intact throughout PSYCH:  Cognitively intact, oriented to person place and time   EKG:  EKG is ordered today. The ekg ordered today demonstrates sinus rhythm rate 72 bpm.  LVH with repolaraization abnormalities.   Recent Labs: 05/02/2015: B Natriuretic Peptide 970.6* 06/01/2015: ALT 28; TSH 1.770 06/10/2015: BUN 17; Creat 1.51*; Hemoglobin 11.8*; Platelets 266; Potassium 4.6; Sodium 140    Lipid Panel    Component Value Date/Time   CHOL 203* 05/03/2015 0440   TRIG 76 05/03/2015 0440   HDL 69 05/03/2015 0440   CHOLHDL 2.9 05/03/2015 0440   VLDL 15 05/03/2015 0440   LDLCALC 119* 05/03/2015 0440      Wt Readings from Last 3 Encounters:  06/10/15 78.926 kg (174 lb)  06/08/15 78.2 kg (172 lb 6.4 oz)  06/06/15 77.111 kg (170 lb)      ASSESSMENT AND PLAN:  # Chronic systolic heart failure: Brandon Carroll has newly-diagnosed heart failure.  The etiology is not yet known.  He needs an ischemia evaluation.  It  is quite possible that his symptoms are due to poorly-controlled hypertension and diabetes.  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  We will refer him for left heart catheterization given his several risk factors for CAD, including hypertension, hyperlipidemia, and family history of premature CAD.  Increase carvedilol to 25 mg daily.  Continue losartan 100 mg daily and lasix.  We discussed the importance of fluid and salt restriction.  Ideally he should be started on Entresto.  However, given that we are making two medication changes, we will wait to make that change at his next appointment.  # Hypertensive heart disease: Blood pressure is poorly-controlled.  We will increase carvedilol to  25 mg bid and continue losartan.  Stop verapamil given that he has systolic heart failure.  Brandon Carroll will check his BP at home and let us know if it is >140/90 mmHg.   # Hyperlipidemia: Brandon Carroll has diabetes and is taking lovastatin.  LDL was 119 in the hospital.  We will  check his lipids at his follow up appointment and likely switch to either atorvastatin or rosuvastatin.  # CV Disease Prevention:  ASCVD 10 year risk is 22%.  Therefore, we will start aspirin 81 mg daily. Aspirin 81  Current medicines are reviewed at length with the patient today.  The patient does not have concerns regarding medicines.  The following changes have been made:  Increase carvedilol.  Stop verapamil.  Start aspirin 81 mg daily..  Labs/ tests ordered today include:   Orders Placed This Encounter  Procedures  . APTT  . Basic metabolic panel  . CBC  . Protime-INR  . EKG 12-Lead  . LEFT HEART CATHETERIZATION WITH CORONARY ANGIOGRAM     Disposition:   FU with Francessca Friis C. Oval Linsey, MD, [facility] in 1 month   This note was written with the assistance of speech recognition software.  Please excuse any transcriptional errors.  Signed, Caron Ode C. Oval Linsey,  MD, [facility]  06/10/2015 10:51 PM    [place]

## 2015-06-15 NOTE — Research (Signed)
CADLAD Informed Consent   Subject Name: Brandon Carroll  Subject met inclusion and exclusion criteria.  The informed consent form, study requirements and expectations were reviewed with the subject and questions and concerns were addressed prior to the signing of the consent form.  The subject verbalized understanding of the trail requirements.  The subject agreed to participate in the CADLAD trial and signed the informed consent.  The informed consent was obtained prior to performance of any protocol-specific procedures for the subject.  A copy of the signed informed consent was given to the subject and a copy was placed in the subject's medical record.  Hedrick,Tammy W 06/15/2015, [id number]

## 2015-06-15 NOTE — Interval H&P Note (Signed)
History and Physical Interval Note:  06/15/2015 9:20 AM  Brandon Carroll  has presented today for surgery, with the diagnosis of cp  The various methods of treatment have been discussed with the patient and family. After consideration of risks, benefits and other options for treatment, the patient has consented to  Procedure(s): Left Heart Cath and Coronary Angiography (N/A) as a surgical intervention .  The patient's history has been reviewed, patient examined, no change in status, stable for surgery.  I have reviewed the patient's chart and labs.  Questions were answered to the patient's satisfaction.    Cath Lab Visit (complete for each Cath Lab visit)  Clinical Evaluation Leading to the Procedure:   ACS: No.  Non-ACS:    Anginal Classification: CCS II  Anti-ischemic medical therapy: Minimal Therapy (1 class of medications)  Non-Invasive Test Results: No non-invasive testing performed  Prior CABG: No previous CABG       Brandon Carroll [facility] 06/15/2015 9:21 AM

## 2015-06-15 NOTE — Discharge Instructions (Signed)
Radial Site Care °Refer to this sheet in the next few weeks. These instructions provide you with information about caring for yourself after your procedure. Your health care provider may also give you more specific instructions. Your treatment has been planned according to current medical practices, but problems sometimes occur. Call your health care provider if you have any problems or questions after your procedure. °WHAT TO EXPECT AFTER THE PROCEDURE °After your procedure, it is typical to have the following: °· Bruising at the radial site that usually fades within 1-2 weeks. °· Blood collecting in the tissue (hematoma) that may be painful to the touch. It should usually decrease in size and tenderness within 1-2 weeks. °HOME CARE INSTRUCTIONS °· Take medicines only as directed by your health care provider. °· You may shower 24-48 hours after the procedure or as directed by your health care provider. Remove the bandage (dressing) and gently wash the site with plain soap and water. Pat the area dry with a clean towel. Do not rub the site, because this may cause bleeding. °· Do not take baths, swim, or use a hot tub until your health care provider approves. °· Check your insertion site every day for redness, swelling, or drainage. °· Do not apply powder or lotion to the site. °· Do not flex or bend the affected arm for 24 hours or as directed by your health care provider. °· Do not push or pull heavy objects with the affected arm for 24 hours or as directed by your health care provider. °· Do not lift over 10 lb (4.5 kg) for 5 days after your procedure or as directed by your health care provider. °· Ask your health care provider when it is okay to: °¨ Return to work or school. °¨ Resume usual physical activities or sports. °¨ Resume sexual activity. °· Do not drive home if you are discharged the same day as the procedure. Have someone else drive you. °· You may drive 24 hours after the procedure unless otherwise  instructed by your health care provider. °· Do not operate machinery or power tools for 24 hours after the procedure. °· If your procedure was done as an outpatient procedure, which means that you went home the same day as your procedure, a responsible adult should be with you for the first 24 hours after you arrive home. °· Keep all follow-up visits as directed by your health care provider. This is important. °SEEK MEDICAL CARE IF: °· You have a fever. °· You have chills. °· You have increased bleeding from the radial site. Hold pressure on the site. °SEEK IMMEDIATE MEDICAL CARE IF: °· You have unusual pain at the radial site. °· You have redness, warmth, or swelling at the radial site. °· You have drainage (other than a small amount of blood on the dressing) from the radial site. °· The radial site is bleeding, and the bleeding does not stop after 30 minutes of holding steady pressure on the site. °· Your arm or hand becomes pale, cool, tingly, or numb. °  °This information is not intended to replace advice given to you by your health care provider. Make sure you discuss any questions you have with your health care provider. °  °Document Released: 06/02/2010 Document Revised: 05/21/2014 Document Reviewed: 11/16/2013 °Elsevier Interactive Patient Education ©2016 [employer] ° °

## 2015-06-16 MED FILL — Heparin Sodium (Porcine) 2 Unit/ML in Sodium Chloride 0.9%: INTRAMUSCULAR | Qty: 1000 | Status: AC

## 2015-06-21 ENCOUNTER — Encounter: Payer: Self-pay | Admitting: Physician Assistant

## 2015-06-21 ENCOUNTER — Ambulatory Visit (INDEPENDENT_AMBULATORY_CARE_PROVIDER_SITE_OTHER): Payer: BLUE CROSS/BLUE SHIELD | Admitting: Physician Assistant

## 2015-06-21 VITALS — BP 146/90 | HR 73 | Temp 98.3°F | Resp 16 | Wt 175.4 lb

## 2015-06-21 DIAGNOSIS — I5022 Chronic systolic (congestive) heart failure: Secondary | ICD-10-CM

## 2015-06-21 DIAGNOSIS — N521 Erectile dysfunction due to diseases classified elsewhere: Secondary | ICD-10-CM | POA: Diagnosis not present

## 2015-06-21 DIAGNOSIS — E1159 Type 2 diabetes mellitus with other circulatory complications: Secondary | ICD-10-CM

## 2015-06-21 DIAGNOSIS — IMO0002 Reserved for concepts with insufficient information to code with codable children: Secondary | ICD-10-CM

## 2015-06-21 DIAGNOSIS — N529 Male erectile dysfunction, unspecified: Secondary | ICD-10-CM

## 2015-06-21 DIAGNOSIS — E1165 Type 2 diabetes mellitus with hyperglycemia: Secondary | ICD-10-CM

## 2015-06-21 DIAGNOSIS — Z1159 Encounter for screening for other viral diseases: Secondary | ICD-10-CM

## 2015-06-21 DIAGNOSIS — I10 Essential (primary) hypertension: Secondary | ICD-10-CM | POA: Diagnosis not present

## 2015-06-21 MED ORDER — GLIPIZIDE 10 MG PO TABS: 10.0000 mg | ORAL_TABLET | Freq: Two times a day (BID) | ORAL | Status: DC

## 2015-06-21 MED ORDER — FUROSEMIDE 20 MG PO TABS: 40.0000 mg | ORAL_TABLET | ORAL | Status: DC

## 2015-06-21 NOTE — Patient Instructions (Addendum)
Please call [facility] to schedule an ultrasound of your liver. [id number]  RESTART the Lasix (furosemide). Take TWO 20 mg tablets each morning.

## 2015-06-21 NOTE — Progress Notes (Signed)
Patient ID: Brandon Carroll, male    DOB: [birth year], 55 y.o.   MRN: [id number]  PCP: Dessa Ledee, PA-C  Subjective:   Chief Complaint  Patient presents with  . follow up after seeing specialist  . Hypertension    HPI Presents for evaluation of CHF.  Since his last visit with me, he has seen both cardiology and pulmonology.  No word yet from pulmonology on whether sarcoidosis may be the underlying cause of his cardiomyopathy. Sees Dr. Lenna Gilford again in April.   At cardiology, Lisinopril and verapamil stopped. Started losartan, increased carvedilol. Normal heart cath. Held losartan and furosemide for the cath, but he didn't resume the furosemide after. Sees Dr. Oval Linsey again 2/27.  Still coughing a little bit.   He requests testosterone supplementation. Testosterone 320 in 07/2014, 251 in 10/2014. I sent him to endocrinology in June, and he was advised that until his diabetes was controlled, he wouldn't be placed on testosterone.   Review of Systems No CP, dizziness. No SOB, HA. No nausea,vomiting or diarrhea.    Patient Active Problem List   Diagnosis Date Noted  . CKD stage 3 secondary to diabetes ([place]) 06/15/2015  . Chronic systolic CHF (congestive heart failure) ([place]) 06/15/2015  . Abnormal CT of the chest 06/08/2015  . Secondary cardiomyopathy ([place]) 06/08/2015  . Anemia 06/06/2015  . Dyspnea   . Acute systolic CHF (congestive heart failure), NYHA class 2 ([place]) 05/04/2015  . Accelerated hypertension 05/04/2015  . Leukocytosis 05/04/2015  . 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 Acute-on-chronic kidney injury ([place]) 05/04/2015  . Hyperlipidemia with target LDL less than 70 09/18/2011     Prior to Admission medications   Medication Sig Start Date End Date Taking? Authorizing Provider  Albuterol Sulfate (PROAIR RESPICLICK) 123XX123 (90 BASE) MCG/ACT AEPB Inhale 2 puffs into the lungs every 6 (six) hours as  needed. Patient taking differently: Inhale 2 puffs into the lungs every 6 (six) hours as needed (shortness of breathe).  04/27/15  Yes Robyn Haber, MD  aspirin EC 81 MG tablet Take 81 mg by mouth daily.   Yes Historical Provider, MD  carvedilol (COREG) 25 MG tablet Take 1 tablet (25 mg total) by mouth 2 (two) times daily. 06/10/15  Yes Skeet Latch, MD  furosemide (LASIX) 20 MG tablet Take 2 tablets (40 mg total) by mouth 2 (two) times daily. Weigh daily. When weight is 166 or less, reduce to 1 tablet BID. Patient taking differently: Take 20 mg by mouth 2 (two) times daily. Weigh daily. When weight is 166 or less, reduce to 1 tablet BID. 06/01/15  Yes Zailen Albarran, PA-C  glipiZIDE (GLUCOTROL) 10 MG tablet Take 1 tablet (10 mg total) by mouth 2 (two) times daily before a meal. 06/01/15  Yes Thomos Domine, PA-C  lisinopril (PRINIVIL,ZESTRIL) 40 MG tablet Take 40 mg by mouth daily.   Yes Historical Provider, MD  losartan (COZAAR) 100 MG tablet Take 1 tablet (100 mg total) by mouth daily. 06/08/15  Yes Noralee Space, MD  lovastatin (MEVACOR) 20 MG tablet Take 1 tablet (20 mg total) by mouth daily. 06/01/15  Yes Ourania Hamler, PA-C  verapamil (CALAN-SR) 120 MG CR tablet Take 120 mg by mouth daily.   Yes Historical Provider, MD     No Known Allergies     Objective:  Physical Exam  Constitutional: He is oriented to person, place, and time. He appears well-developed and well-nourished. He is active and cooperative. No distress.  BP 146/90 mmHg  Pulse 73  Temp(Src) 98.3 F (36.8 C) (Oral)  Resp 16  Wt 175 lb 6.4 oz (79.561 kg)  HENT:  Head: Normocephalic and atraumatic.  Right Ear: Hearing normal.  Left Ear: Hearing normal.  Eyes: Conjunctivae are normal. No scleral icterus.  Neck: Normal range of motion. Neck supple. No thyromegaly present.  Cardiovascular: Normal rate, regular rhythm and normal heart sounds.   Pulses:      Radial pulses are 2+ on the right side, and 2+ on the left  side.  1+ pitting edema bilateral lower extremities. Weight is up 10 lbs from last visit.  Pulmonary/Chest: Effort normal and breath sounds normal.  Lymphadenopathy:       Head (right side): No tonsillar, no preauricular, no posterior auricular and no occipital adenopathy present.       Head (left side): No tonsillar, no preauricular, no posterior auricular and no occipital adenopathy present.    He has no cervical adenopathy.       Right: No supraclavicular adenopathy present.       Left: No supraclavicular adenopathy present.  Neurological: He is alert and oriented to person, place, and time. No sensory deficit.  Skin: Skin is warm, dry and intact. No rash noted. No cyanosis or erythema. Nails show no clubbing.  Psychiatric: He has a normal mood and affect. His speech is normal and behavior is normal.           Assessment & Plan:   1. Uncontrolled type II diabetes circulatory disord erectile dysfunction, CKD stage II ([place]) Continue working on getting better control. Await A1C. Adjust treatment regimen pending results. - Hemoglobin A1c - glipiZIDE (GLUCOTROL) 10 MG tablet; Take 1 tablet (10 mg total) by mouth 2 (two) times daily before a meal.  Dispense: 60 tablet; Refill: 5  2. Accelerated hypertension 3. Chronic systolic CHF (congestive heart failure) ([facility]) Looks to be improving, though he misunderstood the need to resume furosemide. Resume furosemide. Follow-up with cardiology and pulmonology as planned. - furosemide (LASIX) 20 MG tablet; Take 2 tablets (40 mg total) by mouth every morning.  Dispense: 180 tablet; Refill: 0  4. Erectile dysfunction, unspecified erectile dysfunction type Encouraged him to work hard on the lifestyle changes to control his DM, and we'll continue to work on medication management. Once his A1C controlled/stable, we'll refer back to endocrinology.  5. Need for hepatitis C screening test - Hepatitis C antibody   [name], PA-C Physician  Assistant-Certified [facility]

## 2015-06-22 LAB — HEMOGLOBIN A1C
HEMOGLOBIN A1C: 7.4 % — AB (ref <5.7)
MEAN PLASMA GLUCOSE: 166 mg/dL — AB (ref <117)

## 2015-06-22 LAB — HEPATITIS C ANTIBODY: HCV AB: NEGATIVE

## 2015-06-25 ENCOUNTER — Encounter: Payer: Self-pay | Admitting: Physician Assistant

## 2015-07-05 ENCOUNTER — Ambulatory Visit (INDEPENDENT_AMBULATORY_CARE_PROVIDER_SITE_OTHER): Payer: BLUE CROSS/BLUE SHIELD | Admitting: Adult Health

## 2015-07-05 ENCOUNTER — Telehealth: Payer: Self-pay | Admitting: Pulmonary Disease

## 2015-07-05 ENCOUNTER — Other Ambulatory Visit: Payer: BLUE CROSS/BLUE SHIELD

## 2015-07-05 ENCOUNTER — Encounter: Payer: Self-pay | Admitting: Adult Health

## 2015-07-05 VITALS — BP 144/82 | HR 69 | Temp 98.3°F | Ht 70.0 in | Wt 176.0 lb

## 2015-07-05 DIAGNOSIS — I5022 Chronic systolic (congestive) heart failure: Secondary | ICD-10-CM | POA: Diagnosis not present

## 2015-07-05 DIAGNOSIS — R9389 Abnormal findings on diagnostic imaging of other specified body structures: Secondary | ICD-10-CM

## 2015-07-05 DIAGNOSIS — R938 Abnormal findings on diagnostic imaging of other specified body structures: Secondary | ICD-10-CM

## 2015-07-05 DIAGNOSIS — R0600 Dyspnea, unspecified: Secondary | ICD-10-CM

## 2015-07-05 DIAGNOSIS — R21 Rash and other nonspecific skin eruption: Secondary | ICD-10-CM | POA: Diagnosis not present

## 2015-07-05 NOTE — Progress Notes (Signed)
Subjective:     Patient ID: Brandon Carroll, male   DOB: August 23, 1960, 55 y.o.   MRN: [id number]  HPI ~  June 08, 2015:  Initial pulmonary consult by SN>        27 y/o man referred by Harrison Mons PA, [facility] for a pulmonary evaluation to r/o Sarcoidosis;  He has a history of a cardiomyopathy, chronic systolic CHF w/ Q000111Q, HBP, DM, HL, etc;  He was [facility] 12/19 - 05/05/15 by [facility] after presenting w/ dyspnea, PND & 2 pillow orthopnea;  CXR showed bilat effusions, some incr markings and prob LLL atx;  2DEcho showed a cardiomyopathy w/ EF=40-45%;  Labs showed elev BNP=971, mild renal insuffic w/ Cr=1.4-1.6, mild anemia w/ Hg=11-12 and small cells w/ MCV=76;  Pt was diuresed w/ Lasix & lost 7# of fluid w/ improvement in his breathing (also given empiric Levaquin rx) and he was disch for outpt follow up... CT Chest performed 05/04/15 showed scattered bilat pulm nodules (largest=45mm in RUL) and mild adenopathy (R paratrach/ subcarinal/ aortopulm window/ & ?bilat hilar regions), norm heart size, coronary atherosclerosis noted, bilat effusions and basilar atx...        For his part the patient states that his SOB has completely resolved (w/ treatment of his CHF);  He notes min cough, small amt of clear sput, no hemoptysis, denies f/c/s, no CP;  He notes some DOE but able to perform his duties at [employer] up & down stairs now...   Smoking Hx>  He is a never smoker...  Pulmonary Hx>  He has no prev hx of lung disease- no hx asthma, bronchitis, pneumonia, Tb or exposure.  Medical Hx>  As below-- HBP, CHF/cardiomyopathy (never seen by Cards), DM, HL, RI, anemia...  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  Current Meds>  AlbutHFA prn, Mucinex, Coreg, Calan, Lisinopril, Lasix, Mevacor, Glucotrol  EXAM shows Afeb, VSS, O2sat=98% on RA;  Heent- neg, mallampati2;  Chest- clear w/o w/r/r;  Heart-  RR w/o m/r/g;  Abd- soft, nontender, neg;  Ext- neg w/o c/c/e;  Neuro- intact w/o focal deficits...  CXR 05/02/15 showed bilat effusions, some incr markings and prob LLL atx  EKG 05/02/15 showed NSR, rate73/min, +STTWA- consider inferolat ischemia...  2DEcho 05/03/15 showed mild LVH, mod reduced LVF w/ EF=40-45% & diffuse HK; MV & AoV are wnl, LA is mod dilated, RA is mildly dilated  Labs in Epic from 04/2015 showed elev BNP=971, mild renal insuffic w/ Cr=1.4-1.6, mild anemia w/ Hg=11-12 and small cells w/ MCV=76   CT Chest performed 05/04/15 showed scattered bilat pulm nodules (largest=67mm in RUL) and mild adenopathy (R paratrach/ subcarinal/ aortopulm window/ & ?bilat hilar regions), norm heart size, coronary atherosclerosis noted, bilat effusions and basilar atx...    Spirometry 06/08/15 showed FVC=3.36 (81%), FEV1=2.62 (79%), %1sec=78, mid-flows are sl reduced at 61% predicted; This is c/w very mild airflow obstruction esp in small airways, can't r/o superimposed restriction w/o LV measurement.   Ambulatory Oximetry 06/08/15>  O2sat=100% on RA at rest w/ pulse=66/min; He ambulated 3 laps w/o difficulty, lowest O2sat=99% w/ pulse=74/min...    07/05/15 Acute OV : Dyspnea/Abnormal CT / ? Sarcoid  Pt returns for work in visit . Complains of persistent fatigue, dry cough, weight gain, wheezing and dyspnea.  He was seen 1 month ago for possible sarcoid . CT chest showed scattered bilateral pulmonary nodules and mild adenopathy. Recent  hospitalization showed CM w/ systolic CHF , EF [id number]. Elevated BNP at 971. CXR/CT showed small bilateral pleural effusions. Dr Lenna Gilford referred him to cardiology last month.  Pt was set up for cardiac cath that showed no sign CAD. He has been maintained on coreg , losartan and lasix. Pt says he remains sob with activity. Has dry cough  We discussed getting an ACE level today.  Pt does have intermittent rash that comes and goes.  Pt is a never smoker. Previous spirometry  shows very minimal airflow obstruction.  He denies chest pain, orthopnea, edema, fever or hemoptysis .     Past Medical History  Diagnosis Date  . Diabetes mellitus   . Hypertension   . Erectile dysfunction   . Hyperlipidemia    Current Outpatient Prescriptions on File Prior to Visit  Medication Sig Dispense Refill  . Albuterol Sulfate (PROAIR RESPICLICK) 123XX123 (90 BASE) MCG/ACT AEPB Inhale 2 puffs into the lungs every 6 (six) hours as needed. (Patient taking differently: Inhale 2 puffs into the lungs every 6 (six) hours as needed (shortness of breathe). ) 1 each 3  . aspirin EC 81 MG tablet Take 81 mg by mouth daily.    . carvedilol (COREG) 25 MG tablet Take 1 tablet (25 mg total) by mouth 2 (two) times daily. 60 tablet 11  . furosemide (LASIX) 20 MG tablet Take 2 tablets (40 mg total) by mouth every morning. 180 tablet 0  . glipiZIDE (GLUCOTROL) 10 MG tablet Take 1 tablet (10 mg total) by mouth 2 (two) times daily before a meal. 60 tablet 5  . losartan (COZAAR) 100 MG tablet Take 1 tablet (100 mg total) by mouth daily. 30 tablet 3  . lovastatin (MEVACOR) 20 MG tablet Take 1 tablet (20 mg total) by mouth daily. 30 tablet 5   No current facility-administered medications on file prior to visit.   ROS  Constitutional:   No  weight loss, night sweats,  Fevers, chills, + fatigue, or  lassitude.  HEENT:   No headaches,  Difficulty swallowing,  Tooth/dental problems, or  Sore throat,                No sneezing, itching, ear ache, nasal congestion, post nasal drip,   CV:  No chest pain,  Orthopnea, PND, swelling in lower extremities, anasarca, dizziness, palpitations, syncope.   GI  No heartburn, indigestion, abdominal pain, nausea, vomiting, diarrhea, change in bowel habits, loss of appetite, bloody stools.   Resp:   No chest wall deformity  Skin: +rash   GU: no dysuria, change in color of urine, no urgency or frequency.  No flank pain, no hematuria   MS:  No joint pain or swelling.   No decreased range of motion.  No back pain.  Psych:  No change in mood or affect. No depression or anxiety.  No memory loss.     EXAM  Filed Vitals:   07/05/15 1108  BP: 144/82  Pulse: 69  Temp: 98.3 F (36.8 C)  TempSrc: Oral  Height: 5\' 10"  (1.778 m)  Weight: 176 lb (79.833 kg)  SpO2: 100%    GEN: A/Ox3; pleasant , NAD, well nourished   HEENT:  [place]/AT,  EACs-clear, TMs-wnl, NOSE-clear, THROAT-clear, no lesions, no postnasal drip or exudate noted.   NECK:  Supple w/ fair ROM; no JVD; normal carotid impulses w/o bruits; no thyromegaly or nodules palpated; no lymphadenopathy.  RESP  Clear  P & A; w/o, wheezes/ rales/ or rhonchi.no accessory muscle use,  no dullness to percussion  CARD:  RRR, no m/r/g  , tr  peripheral edema, pulses intact, no cyanosis or clubbing.  GI:   Soft & nt; nml bowel sounds; no organomegaly or masses detected.  Musco: Warm bil, no deformities or joint swelling noted.   Neuro: alert, no focal deficits noted.    Skin: Warm, hyperpigmented areas noted on arms

## 2015-07-05 NOTE — Telephone Encounter (Signed)
lmomtcb x1 

## 2015-07-05 NOTE — Patient Instructions (Addendum)
Delsym 2 tsp Twice daily  As needed  Cough  Labs today .  Refer to Dermatology for rash and consider biopsy.  Follow up Dr. Lenna Gilford  In 3 weeks and As needed   Please contact office for sooner follow up if symptoms do not improve or worsen or seek emergency care

## 2015-07-06 LAB — ANGIOTENSIN CONVERTING ENZYME: Angiotensin-Converting Enzyme: 41 U/L (ref 8–52)

## 2015-07-06 NOTE — Telephone Encounter (Signed)
Pt saw TP and she wants pt to come back and see SN in 3 weeks. SN does not have any openings.  SN - please advise. Thanks.

## 2015-07-08 ENCOUNTER — Ambulatory Visit
Admission: RE | Admit: 2015-07-08 | Discharge: 2015-07-08 | Disposition: A | Discharge status: Home / Self Care | Payer: BLUE CROSS/BLUE SHIELD | Source: Ambulatory Visit | Attending: Physician Assistant | Admitting: Physician Assistant

## 2015-07-08 DIAGNOSIS — R748 Abnormal levels of other serum enzymes: Secondary | ICD-10-CM

## 2015-07-09 NOTE — Progress Notes (Signed)
Cardiology Office Note   Date:  07/12/2015   ID:  Brandon Carroll, DOB 04/11/1961, MRN [id number]  PCP:  JEFFERY,CHELLE, PA-C  Cardiologist:   Sharol Harness, MD   Chief Complaint  Patient presents with  . Follow-up    no chest pain, tired and shortness of breath, some swelling, no cramping, no dizziness or lightheadedness    Patient ID: Brandon Carroll is a 55 y.o. male with hypertension, diabetes mellitus, chronic kidney disease, chronic systolic heart failure, and hyperlipidemia who presents for management of heart failure.    Interval History 07/11/15: After his last appointment Brandon Carroll underwent cardiac catheterization for evaluation of his systolic dysfunction.  He was noted to have 10% lesions in the LAD and RCA.  He also was evaluated by Brandon Edison, PA of pulmonology.  At that appointment his lisinopril was switched to losartan but did not have any further evaluation for his shortness of breath.  Brandon Carroll continues to complain of shortness of breath with minimal exertion.  He gets short of breath trying to take the trash out.  He denies any chest pain, palpitations, LE edema, orthopnea or PND.  His weight has fluctuated between 167-175.  He reports getting a good urinary response to lasix.  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  He was noted to have small pleural effusions on chest xray and renal ultrasound.  He did not have any pulmonary edema.    History of Present Illness 06/10/15: Brandon Carroll presented to the ED on 12/14 with a complaint of productive cough.  He also noted shortness of breath that started 2-3 weeks prior to admission.  He endorsed two pillow orthopnea and exertional shortness of breath.  He was found to have pneumonia and new onset systolic heart failure.  Chest CT was concerning for sarcoidosis and revealed an incidental  finding of calcification of two coronary arteries. His LVEF was 45% on echo.  He was started on lasix and discharged at a weight of 166 lb.  He lost 7 lb during that hospitalization.  He followed up with his PCP on 1/23, at which time lisinopril was increased to 40 mg due to poorly-controlled hypertension.  Since leaving the hospital he has been doing well.  At discharge his breathing was much better but he started developing some increased shortness of breath  His weight also increased to 170 lb. He saw his PCP who recommended that he increase lasix to 40 mg bid and referred him to cardiology for further evaluation.  Brandon Carroll has reduced the sodium in his diet and has not noted any lower extremity edema.  He denies orthopnea or PND.  He also denies chest pain.  However, he has been feeling very fatigued, especially with exertion.    Past Medical History  Diagnosis Date  . Diabetes mellitus   . Hypertension   . Erectile dysfunction   . Hyperlipidemia     Past Surgical History  Procedure Laterality Date  . Hernia repair  7th grade    umbilical  . Cardiac catheterization N/A 06/15/2015    Procedure: Left Heart Cath and Coronary Angiography;  Surgeon: Brandon M Martinique, MD;  Location: [facility];  Service: Cardiovascular;  Laterality: N/A;     Current Outpatient Prescriptions  Medication Sig Dispense Refill  . Albuterol Sulfate (PROAIR RESPICLICK) 123XX123 (90 BASE)  MCG/ACT AEPB Inhale 2 puffs into the lungs every 6 (six) hours as needed. (Patient taking differently: Inhale 2 puffs into the lungs every 6 (six) hours as needed (shortness of breathe). ) 1 each 3  . aspirin EC 81 MG tablet Take 81 mg by mouth daily.    . carvedilol (COREG) 25 MG tablet Take 1 tablet (25 mg total) by mouth 2 (two) times daily. 60 tablet 11  . furosemide (LASIX) 20 MG tablet Take 2 tablets (40 mg total) by mouth every morning. 180 tablet 0  . glipiZIDE (GLUCOTROL) 10 MG tablet Take 1 tablet (10 mg total) by  mouth 2 (two) times daily before a meal. 60 tablet 5  . losartan (COZAAR) 100 MG tablet Take 1 tablet (100 mg total) by mouth daily. 30 tablet 3  . lovastatin (MEVACOR) 20 MG tablet Take 1 tablet (20 mg total) by mouth daily. 30 tablet 5  . amLODipine (NORVASC) 5 MG tablet Take 1 tablet (5 mg total) by mouth daily. 30 tablet 11   No current facility-administered medications for this visit.    Allergies:   Review of patient's allergies indicates no known allergies.    Social History:  The patient  reports that he has never smoked. He has never used smokeless tobacco. He reports that he does not drink alcohol or use illicit drugs.   Family History:  The patient's family history includes Arthritis in his mother and sister; COPD in his mother and sister; Diabetes in his brother and mother; Hypertension in his father and mother. There is no history of Colon cancer.    ROS:  Please see the history of present illness.   Otherwise, review of systems are positive for none.   All other systems are reviewed and negative.    PHYSICAL EXAM: VS:  BP 180/100 mmHg  Pulse 80  Ht 5\' 10"  (1.778 m)  Wt 79.833 kg (176 lb)  BMI 25.25 kg/m2 , BMI Body mass index is 25.25 kg/(m^2). GENERAL:  Well appearing HEENT:  Pupils equal round and reactive, fundi not visualized, oral mucosa unremarkable NECK:  JVP at the clavicle at 45 degrees, waveform within normal limits, carotid upstroke brisk and symmetric, no bruits, no thyromegaly LYMPHATICS:  No cervical adenopathy LUNGS:  Clear to auscultation bilaterally HEART:  RRR.  PMI not displaced or sustained,S1 and S2 within normal limits, no S3, no S4, no clicks, no rubs, no murmurs ABD:  Flat, positive bowel sounds normal in frequency in pitch, no bruits, no rebound, no guarding, no midline pulsatile mass, no hepatomegaly, no splenomegaly EXT:  2 plus pulses throughout, no edema, no cyanosis no clubbing SKIN:  No rashes no nodules NEURO:  Cranial nerves II through  XII grossly intact, motor grossly intact throughout PSYCH:  Cognitively intact, oriented to person place and time   EKG:  EKG is not ordered today. The ekg ordered today demonstrates sinus rhythm rate 72 bpm.  LVH with repolaraization abnormalities.  LHC 06/15/15:  Prox LAD to Mid LAD lesion, 10% stenosed.  Prox RCA to Mid RCA lesion, 10% stenosed.  1. No significant CAD 2. Elevated LV filling pressure   Recent Labs: 05/02/2015: B Natriuretic Peptide 970.6* 06/01/2015: ALT 28; TSH 1.770 06/10/2015: Hemoglobin 11.8*; Platelets 266 07/11/2015: BUN 15; Creat 1.44*; Potassium 4.3; Sodium 140    Lipid Panel    Component Value Date/Time   CHOL 203* 05/03/2015 0440   TRIG 76 05/03/2015 0440   HDL 69 05/03/2015 0440   CHOLHDL 2.9 05/03/2015 0440  VLDL 15 05/03/2015 0440   LDLCALC 119* 05/03/2015 0440      Wt Readings from Last 3 Encounters:  07/11/15 79.833 kg (176 lb)  07/05/15 79.833 kg (176 lb)  06/21/15 79.561 kg (175 lb 6.4 oz)      ASSESSMENT AND PLAN:  # Chronic systolic and diastolic heart failure: LVEF 40-45%.  Mr. Torchio volume status is improved today.  I am suspicious that his shortness of breath may be related to his underlying lung disease and possible sarcoidosis.  He appear to be euvolemic.  LVEF is reduced, likely due to poorly-controlled hypertension.  He has shortness of breath but no evidence of volume overload on exam and no edema on CXR.  I suspect that if we increase his diuresis he will have worsened renal function, as he appears euvolemic.  We will check a BNP today.  Cardiac catheterization was negative for obstructive coronary disease.  Continue carvedilol, losartan, and lasix.   # Shortness of breath: CT was concerning for sarcoidosis, though serum ACE level was within normal limits.  His heart failure seems to be well-controlled at this time.  It is possible that his poorly-controlled BP is contributing to his exertional dyspnea.  Given his normal  coronary arteries, it is not related to ischemia.  We will continue to titrate his antihypertensives and recommend continued follow up with pulmonology.  # Hypertensive heart disease: Blood pressure remains poorly-controlled.  Continue carvedilol and losartan.  We will start amlodpine 5 mg daily.  He will follow up to have his blood pressure checked in 2 weeks.  # Hyperlipidemia: Continue lovastatin. Check lipids at follow up appointment.  # CV Disease Prevention:  ASCVD 10 year risk is 22%.  Continue aspirin and lovastatin.      Current medicines are reviewed at length with the patient today.  The patient does not have concerns regarding medicines.  The following changes have been made: Start amlodipine 5 mg.  Labs/ tests ordered today include:   Orders Placed This Encounter  Procedures  . Basic metabolic panel     Disposition:   FU with Scottlyn Mchaney C. Oval Linsey, MD, [facility] in 2 weeks.     This note was written with the assistance of speech recognition software.  Please excuse any transcriptional errors.  Signed, Vuk Skillern C. Oval Linsey, MD, [facility]  07/12/2015 1:47 PM    [facility]

## 2015-07-11 ENCOUNTER — Ambulatory Visit (INDEPENDENT_AMBULATORY_CARE_PROVIDER_SITE_OTHER): Payer: BLUE CROSS/BLUE SHIELD | Admitting: Cardiovascular Disease

## 2015-07-11 ENCOUNTER — Encounter: Payer: Self-pay | Admitting: Cardiovascular Disease

## 2015-07-11 VITALS — BP 180/100 | HR 80 | Ht 70.0 in | Wt 176.0 lb

## 2015-07-11 DIAGNOSIS — I5042 Chronic combined systolic (congestive) and diastolic (congestive) heart failure: Secondary | ICD-10-CM

## 2015-07-11 DIAGNOSIS — I110 Hypertensive heart disease with heart failure: Secondary | ICD-10-CM

## 2015-07-11 DIAGNOSIS — R0602 Shortness of breath: Secondary | ICD-10-CM

## 2015-07-11 DIAGNOSIS — R600 Localized edema: Secondary | ICD-10-CM | POA: Diagnosis not present

## 2015-07-11 DIAGNOSIS — E785 Hyperlipidemia, unspecified: Secondary | ICD-10-CM

## 2015-07-11 LAB — BASIC METABOLIC PANEL
BUN: 15 mg/dL (ref 7–25)
CO2: 27 mmol/L (ref 20–31)
Calcium: 8.7 mg/dL (ref 8.6–10.3)
Chloride: 105 mmol/L (ref 98–110)
Creat: 1.44 mg/dL — ABNORMAL HIGH (ref 0.70–1.33)
Glucose, Bld: 166 mg/dL — ABNORMAL HIGH (ref 65–99)
POTASSIUM: 4.3 mmol/L (ref 3.5–5.3)
SODIUM: 140 mmol/L (ref 135–146)

## 2015-07-11 MED ORDER — AMLODIPINE BESYLATE 5 MG PO TABS: 5.0000 mg | ORAL_TABLET | Freq: Every day | ORAL | Status: DC

## 2015-07-11 NOTE — Patient Instructions (Signed)
Medication Instructions: Dr Oval Linsey has recommended making the following medication changes: START Amlodipine (Norvasc) 5 mg - take 1 tablet (5 mg total) by mouth daily  >>A new prescription has been sent to your pharmacy electronically  Labwork: Your physician recommends that you return for lab work TODAY.  Testing/Procedures: NONE  Follow-up: Dr Oval Linsey recommends that you schedule a follow-up appointment in 2 weeks.  If you need a refill on your cardiac medications before your next appointment, please call your pharmacy.

## 2015-07-11 NOTE — Telephone Encounter (Signed)
Called and left message to schedule appointment for patient. Dr. Lenna Gilford has some openings on 08/04/15.

## 2015-07-11 NOTE — Progress Notes (Signed)
Quick Note:  LVM for pt to return call ______ 

## 2015-07-12 ENCOUNTER — Encounter: Payer: Self-pay | Admitting: Cardiovascular Disease

## 2015-07-12 NOTE — Telephone Encounter (Signed)
No openings in 3 weeks with SN. Please advise SN.  Thanks!

## 2015-07-12 NOTE — Telephone Encounter (Signed)
Per SN: okay to use the 10:30am slot on 3.15.17, thanks.  LMOM TCB x1 for pt

## 2015-07-12 NOTE — Progress Notes (Signed)
Quick Note:  LVM for pt to return call ______ 

## 2015-07-13 NOTE — Assessment & Plan Note (Signed)
Bilateral pulmonary nodules with mild adenopathy ? Etiology  Will check ACE level today  Cont to follow with cardiology for CHF management.  Will refer to derm to rash eval ? Sarcoid related, if neg dx from this will need to consider on return possible FOB.   Plan  Delsym 2 tsp Twice daily  As needed  Cough  Labs today .  Refer to Dermatology for rash and consider biopsy.  Follow up Dr. Lenna Gilford  In 3 weeks and As needed   Please contact office for sooner follow up if symptoms do not improve or worsen or seek emergency care

## 2015-07-13 NOTE — Assessment & Plan Note (Signed)
Cont on current regimen  Follow up with cards as planned .

## 2015-07-13 NOTE — Telephone Encounter (Signed)
lmtcb for pt.  

## 2015-07-14 ENCOUNTER — Telehealth: Payer: Self-pay | Admitting: *Deleted

## 2015-07-14 NOTE — Telephone Encounter (Signed)
Spoke with pt. He has been scheduled to see SN on 07/27/15 at 10:30am.

## 2015-07-14 NOTE — Telephone Encounter (Signed)
-----   Message from Skeet Latch, MD sent at 07/12/2015 10:41 PM EST ----- Kidney function is stable.  His glucose levels are high.  Continue all medications as prescribed.

## 2015-07-14 NOTE — Telephone Encounter (Signed)
LEFT MESSAGE TO CALL BACK

## 2015-07-18 ENCOUNTER — Ambulatory Visit (INDEPENDENT_AMBULATORY_CARE_PROVIDER_SITE_OTHER): Payer: BLUE CROSS/BLUE SHIELD | Admitting: Physician Assistant

## 2015-07-18 VITALS — BP 158/94 | HR 66 | Temp 97.6°F | Resp 17 | Ht 69.5 in | Wt 177.0 lb

## 2015-07-18 DIAGNOSIS — R0602 Shortness of breath: Secondary | ICD-10-CM | POA: Diagnosis not present

## 2015-07-18 DIAGNOSIS — I10 Essential (primary) hypertension: Secondary | ICD-10-CM | POA: Diagnosis not present

## 2015-07-18 DIAGNOSIS — I5022 Chronic systolic (congestive) heart failure: Secondary | ICD-10-CM

## 2015-07-18 NOTE — Progress Notes (Signed)
Quick Note:  LVM for pt to return call ______ 

## 2015-07-18 NOTE — Progress Notes (Signed)
Patient ID: Brandon Carroll, male    DOB: 1960/07/20, 55 y.o.   MRN: [id number]  PCP: Nareg Breighner, PA-C  Subjective:   Chief Complaint  Patient presents with  . Follow-up    shortness of breath, cough,     HPI Presents for further evaluation of SOB x3 months.   Has been struggling with SOB, cough, and LLE for 3 months. Was hospitalized 65months ago due SOB with suspicion of pneumonia. He was diagnosed with CHF and possible sarcoidosis at that time. Some of his medications include lasix 40mg  daily, losartan 100mg , and carvedilol 25 mg. Recently started on amlodipine 5mg  for his uncontrolled HTN.   SOB was improved after hospital stay where he was diuresed. It has since gotten worse again, despite continued diuresis, increase in HTN management. Complains of significant DOE and wheezing, with difficulty breathing after walking only half a block. 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   Has to use albuterol 3x/shift.  He has seen cardiology and pulmonology. Those notes are again reviewed. At his visit with cardiology 07/11/2015 he was determined to be euvolemic. Next step is to go back to pulmonology for additional evaluation of possible sarcoidosis. He had a normal ACE level and has not scheduled the visit with dermatology for biopsy of the rash on the neck, as he did not understand the possible link between that and possible underlying lung disease.  Feels like he has fluid on his lungs. Also complains of dry cough, slightly improved with an OTC medicine. Weighs himself every morning, and reports has gained 10lbs in 3-4 days despite taking lasix medication. Report LLE that "comes and goes." Sleeps with 2 pillows but also reports having to sleep sitting up due to difficulty breathing when flat. His weight has been stable over the past 3-4 visits here and  with specialists.  He is scheduled to follow up with pulmology March 15.   1 week ago was diagnosed with a gallbladder polyp. The RLL pulmonary effusion was noted and unchanged since the CT in 04/2015 during his hospitalization.    Review of Systems Constitutional: Positive for fatigue. Negative for fever and chills.  HENT: Negative for congestion, hearing loss, rhinorrhea and sinus pressure.  Eyes: Negative for photophobia.  Respiratory: Positive for cough, shortness of breath and wheezing. Negative for chest tightness.  Cardiovascular: Positive for leg swelling. Negative for chest pain.  Gastrointestinal: Positive for abdominal pain (RUQ gallbladder). Negative for nausea, vomiting and diarrhea.  Endocrine: Negative for polydipsia and polyphagia.  Musculoskeletal: Negative for back pain.  Skin: Positive for rash (right shoulder and neck).  Neurological: Negative for dizziness, light-headedness and headaches.     Patient Active Problem List   Diagnosis Date Noted  . Erectile dysfunction 06/21/2015  . CKD stage 3 secondary to diabetes ([place]) 06/15/2015  . Chronic systolic CHF (congestive heart failure) ([place]) 06/15/2015  . Abnormal CT of the chest 06/08/2015  . Secondary cardiomyopathy ([place]) 06/08/2015  . Anemia 06/06/2015  . Dyspnea   . Acute systolic CHF (congestive heart failure), NYHA class 2 ([place]) 05/04/2015  . Accelerated hypertension 05/04/2015  . Leukocytosis 05/04/2015  . 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 Acute-on-chronic kidney injury ([place]) 05/04/2015  . Hyperlipidemia with target LDL less than 70 09/18/2011     Prior to Admission medications  Medication Sig Start Date End Date Taking? Authorizing Provider  Albuterol Sulfate (PROAIR RESPICLICK) 123XX123 (90 BASE) MCG/ACT AEPB Inhale 2 puffs into the lungs every 6 (six) hours as needed. Patient taking differently: Inhale 2 puffs into the lungs every 6 (six) hours as  needed (shortness of breathe).  04/27/15  Yes Robyn Haber, MD  amLODipine (NORVASC) 5 MG tablet Take 1 tablet (5 mg total) by mouth daily. 07/11/15  Yes Skeet Latch, MD  aspirin EC 81 MG tablet Take 81 mg by mouth daily.   Yes Historical Provider, MD  carvedilol (COREG) 25 MG tablet Take 1 tablet (25 mg total) by mouth 2 (two) times daily. 06/10/15  Yes Skeet Latch, MD  furosemide (LASIX) 20 MG tablet Take 2 tablets (40 mg total) by mouth every morning. 06/21/15  Yes Donnae Michels, PA-C  glipiZIDE (GLUCOTROL) 10 MG tablet Take 1 tablet (10 mg total) by mouth 2 (two) times daily before a meal. 06/21/15  Yes Shaylinn Hladik, PA-C  losartan (COZAAR) 100 MG tablet Take 1 tablet (100 mg total) by mouth daily. 06/08/15  Yes Noralee Space, MD  lovastatin (MEVACOR) 20 MG tablet Take 1 tablet (20 mg total) by mouth daily. 06/01/15  Yes Williard Keller, PA-C     No Known Allergies     Objective:  Physical Exam  Constitutional: He is oriented to person, place, and time. He appears well-developed and well-nourished. He is active and cooperative. No distress.  BP 158/94 mmHg  Pulse 66  Temp(Src) 97.6 F (36.4 C) (Oral)  Resp 17  Ht 5' 9.5" (1.765 m)  Wt 177 lb (80.287 kg)  BMI 25.77 kg/m2  SpO2 98%  HENT:  Head: Normocephalic and atraumatic.  Right Ear: Hearing normal.  Left Ear: Hearing normal.  Eyes: Conjunctivae are normal. No scleral icterus.  Neck: Normal range of motion. Neck supple. No thyromegaly present.  Cardiovascular: Normal rate, regular rhythm and normal heart sounds.   Pulses:      Radial pulses are 2+ on the right side, and 2+ on the left side.  NO LE EDEMA.  Pulmonary/Chest: Effort normal and breath sounds normal.  Lymphadenopathy:       Head (right side): No tonsillar, no preauricular, no posterior auricular and no occipital adenopathy present.       Head (left side): No tonsillar, no preauricular, no posterior auricular and no occipital adenopathy present.    He has  no cervical adenopathy.       Right: No supraclavicular adenopathy present.       Left: No supraclavicular adenopathy present.  Neurological: He is alert and oriented to person, place, and time. No sensory deficit.  Skin: Skin is warm, dry and intact. No rash noted. No cyanosis or erythema. Nails show no clubbing.  Psychiatric: He has a normal mood and affect. His speech is normal and behavior is normal.           Assessment & Plan:   1. SOB (shortness of breath) I'm concerned about this. No evidence of CHF on exam today, though he describes orthopnea and sleeps on 2 pillows. Spoke with pulmonology, and they can work him in in 2 days with Ms. Elie Confer, NP for evaluation. The concern is for sarcoid, or other pulmonary etiology, now that he is stable and improved from a cardiology standpoint. Encouraged to schedule with dermatology, explaining that sarcoidosis can effect multiple different organ systems, and a diagnosis by skin biopsy. I am reluctant to initiate prednisone treatment without a more certain  diagnosis, given his diabetes (though it is reasonably controlled presently).  2. Accelerated hypertension Increase amlodipine from 5 mg to 10 mg by taking 2 5 mg tablets daily. He sees cardiology again next week.  3. Chronic systolic CHF (congestive heart failure) (H[facility]) Euvolemic. Maximize hypertension control. Follow-up with cardiology as planned.   [name], PA-C Physician Assistant-Certified [facility]

## 2015-07-18 NOTE — Patient Instructions (Addendum)
When you see Dr. Oval Linsey, and Ms. Elie Confer, be sure to tell tham that you are taking 2 of the amlodipine tablets, instead of just one.  Schedule with the dermatologist. We want them to take a biopsy of the rash to see if it is SARCOIDOSIS. [facility], Dr. Elvera Lennox.

## 2015-07-18 NOTE — Progress Notes (Signed)
Subjective:    Patient ID: Brandon Carroll, male    DOB: [birth year], 55 y.o.   MRN: [id number] Chief Complaint  Patient presents with  . Follow-up    shortness of breath, cough,    HPI  Presents today for a further evaluation of SOB x3 months.   Has been struggling with SOB, cough, and LLE for 3 months. Was hospitalized 52months ago due SOB with suspicion of pneumonia. He was diagnosed with CHF and possible sarcoidosis at that time. Some of his medications include lasix 40mg  daily, losartan 100mg , and carvedilol 25 mg. Recently started on amlodipine 5mg  for his uncontrolled HTN.   SOB was improved after hospital stay where they were able to pull the fluid off of his lungs. It has since gotten worse. Complains of significant DOE and wheezing, with difficulty breathing after walking only half a block. 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 Has to use albuterol 3x/shift.  Feels like he has fluid on his lungs, which was he says was seen on the [place] he received 1 week ago for gall bladder pain. Also complains of dry cough, slightly improved with an OTC medicine.  Weighs himself every morning, and reports has gained 10lbs in 3-4 days despite taking lasix medication.  Report LLE that "comes and goes." Sleeps with 2 pillows but also reports having to sleep sitting up due to difficulty breathing when flat.f  He is scheduled to see pulmologist March 15. Was seen by pulmonologist, Dr. Michelle Nasuti about a month ago who said they couldn't see him until he was seen by cardiology. Was seen by his cardiologist, Dr. Gaetano Hawthorne, who reported his heart cath "was clear." Saw the NP at the pulmonology clinic but stated "she didn't do anything." Was scheduled to see pulmonologist in April but was able to get appointment moved up due to increasing SOB. Was recommended to see a dermatologist for  diagnosis of sarcoidosis but states he's not worried about his skin but his lungs.  1 week ago was diagnosed with a gallbladder polyp with evidence on ultra sound and recommended to have a cholecysectomy. Is planning to schedule his surgery but concerned about it since he can't breathe.   PMH, FH, and SH were all reviewed with patient and updated as needed.  No Known Allergies Prior to Admission medications   Medication Sig Start Date End Date Taking? Authorizing Provider  Albuterol Sulfate (PROAIR RESPICLICK) 123XX123 (90 BASE) MCG/ACT AEPB Inhale 2 puffs into the lungs every 6 (six) hours as needed. Patient taking differently: Inhale 2 puffs into the lungs every 6 (six) hours as needed (shortness of breathe).  04/27/15  Yes Robyn Haber, MD  amLODipine (NORVASC) 5 MG tablet Take 1 tablet (5 mg total) by mouth daily. 07/11/15  Yes Skeet Latch, MD  aspirin EC 81 MG tablet Take 81 mg by mouth daily.   Yes Historical Provider, MD  carvedilol (COREG) 25 MG tablet Take 1 tablet (25 mg total) by mouth 2 (two) times daily. 06/10/15  Yes Skeet Latch, MD  furosemide (LASIX) 20 MG tablet Take 2 tablets (40 mg total) by mouth every morning. 06/21/15  Yes Chelle Jeffery, PA-C  glipiZIDE (GLUCOTROL) 10 MG tablet Take 1 tablet (10 mg total) by mouth 2 (two) times daily before a meal. 06/21/15  Yes Chelle Jeffery, PA-C  losartan (COZAAR) 100 MG  tablet Take 1 tablet (100 mg total) by mouth daily. 06/08/15  Yes Noralee Space, MD  lovastatin (MEVACOR) 20 MG tablet Take 1 tablet (20 mg total) by mouth daily. 06/01/15  Yes Harrison Mons, PA-C    Review of Systems  Constitutional: Positive for fatigue. Negative for fever and chills.  HENT: Negative for congestion, hearing loss, rhinorrhea and sinus pressure.   Eyes: Negative for photophobia.  Respiratory: Positive for cough, shortness of breath and wheezing. Negative for chest tightness.   Cardiovascular: Positive for leg swelling. Negative for chest pain.    Gastrointestinal: Positive for abdominal pain (RUQ gallbladder). Negative for nausea, vomiting and diarrhea.  Endocrine: Negative for polydipsia and polyphagia.  Musculoskeletal: Negative for back pain.  Skin: Positive for rash (right shoulder and neck).  Neurological: Negative for dizziness, light-headedness and headaches.       Objective:   Physical Exam  Constitutional: He is oriented to person, place, and time. He appears well-developed and well-nourished. No distress.  Blood pressure 158/94, pulse 66, temperature 97.6 F (36.4 C), temperature source Oral, resp. rate 17, height 5' 9.5" (1.765 m), weight 177 lb (80.287 kg), SpO2 98 %.   HENT:  Head: Normocephalic and atraumatic.  Right Ear: Hearing, tympanic membrane, external ear and ear canal normal.  Left Ear: Hearing, tympanic membrane, external ear and ear canal normal.  Nose: Nose normal.  Mouth/Throat: Oropharynx is clear and moist and mucous membranes are normal. No oropharyngeal exudate.  Eyes: Conjunctivae and EOM are normal. Pupils are equal, round, and reactive to light.  Neck: Normal range of motion.    Cardiovascular: Normal rate, regular rhythm, S1 normal, S2 normal and intact distal pulses.  Exam reveals gallop and S3.   Pulses:      Radial pulses are 2+ on the right side, and 2+ on the left side.  Pulmonary/Chest: Effort normal. He has no wheezes. He has no rhonchi. He has no rales.  Lymphadenopathy:       Head (right side): No submental, no submandibular, no tonsillar, no preauricular, no posterior auricular and no occipital adenopathy present.       Head (left side): No submental, no submandibular, no tonsillar, no preauricular, no posterior auricular and no occipital adenopathy present.    He has no cervical adenopathy.       Right: No supraclavicular adenopathy present.       Left: No supraclavicular adenopathy present.  Neurological: He is alert and oriented to person, place, and time. He has normal  strength. No sensory deficit.  Reflex Scores:      Bicep reflexes are 2+ on the right side and 2+ on the left side. Skin: Rash noted. Rash is maculopapular.     Psychiatric: He has a normal mood and affect. His speech is normal and behavior is normal. Thought content normal. Cognition and memory are normal.      Assessment & Plan:  1. SOB (shortness of breath) Concerned about his worsening SOB. Was able to get an appointment with pulmonologist Thursday for further evaluation.   Discussed need for definitive diagnosis of sarcoidosis. Talked about need for biopsy which can be preformed with a dermatologist. Explained that by seeing a dermatologist he would be helping find an answer to his SOB problems.   2. Accelerated hypertension Will increase amlodipine to 10mg  qd. Instructed to tell his nephrologist and cardiologist of the change. Continue other medications as currently prescribed.  3. Chronic systolic CHF (congestive heart failure) ([place]) Appears stable at this time  with no LLE and no weight gain per our records. Will have patient continue to take lasix 40mg  qd. Instructed to monitor weight everyday.   Discussed assessment and plan with patient. They expressed their understanding and acceptance.   [place]

## 2015-07-19 ENCOUNTER — Encounter: Payer: Self-pay | Admitting: Physician Assistant

## 2015-07-20 ENCOUNTER — Ambulatory Visit (INDEPENDENT_AMBULATORY_CARE_PROVIDER_SITE_OTHER): Payer: BLUE CROSS/BLUE SHIELD | Admitting: Acute Care

## 2015-07-20 ENCOUNTER — Encounter: Payer: Self-pay | Admitting: Acute Care

## 2015-07-20 VITALS — BP 132/92 | HR 69 | Ht 69.5 in | Wt 172.2 lb

## 2015-07-20 DIAGNOSIS — L819 Disorder of pigmentation, unspecified: Secondary | ICD-10-CM | POA: Diagnosis not present

## 2015-07-20 DIAGNOSIS — R0600 Dyspnea, unspecified: Secondary | ICD-10-CM | POA: Diagnosis not present

## 2015-07-20 MED ORDER — FLUTICASONE FUROATE-VILANTEROL 100-25 MCG/INH IN AEPB: 1.0000 | INHALATION_SPRAY | Freq: Every day | RESPIRATORY_TRACT | Status: DC

## 2015-07-20 MED ORDER — AEROCHAMBER MV MISC: Status: DC

## 2015-07-20 NOTE — Progress Notes (Signed)
Subjective:    Patient ID: Brandon Carroll, male    DOB: November 05, 1960, 55 y.o.   MRN: [id number]  HPI 55 year old male with history of a cardiomyopathy, chronic systolic CHF w/ Q000111Q, HBP, DM, HL, etc; He was [facility] 12/19 - 05/05/15 by [facility] after presenting w/ dyspnea, PND & 2 pillow orthopnea;  CXR showed bilat effusions, some incr markings and prob LLL atx;  2DEcho showed a cardiomyopathy w/ EF=40-45%;  Labs showed elev BNP=971, mild renal insuffic w/ Cr=1.4-1.6, mild anemia w/ Hg=11-12 and small cells w/ MCV=76;  CT Chest performed 05/04/15 showed scattered bilat pulm nodules (largest=78mm in RUL) and mild adenopathy (R paratrach/ subcarinal/ aortopulm window/ & ?bilat hilar regions), norm heart size, coronary atherosclerosis noted, bilat effusions and basilar atx...  Smoking Hx> He is a never smoker...  Pulmonary Hx> He has no prev hx of lung disease- no hx asthma, bronchitis, pneumonia, Tb or exposure.  Medical Hx> As below-- HBP, CHF/cardiomyopathy (never seen by Cards), DM, HL, RI, anemia...  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  06/15/15: Cardiac Cath: No signs of CAD 07/05/15: ACE Level: 41 U/L   07/20/15: Follow Up  Office Visit:  Pt. Presents to the office today with continued dyspnea, and fatigue. He states his dry cough is better with the use of Delsym, and his weight gain/edema is better with additional Lasix dosing per cardiology. He has not been for his dermatology consult for evaluation of rash and possible punch biopsy to rule out sarcoidosis. He he is using his rescue inhaler approximately 3-4 times daily. Previous spirometry indicates very minimal airflow obstruction. He denies chest pain orthopnea , fever or hemoptysis.   Current outpatient prescriptions:  .  Albuterol Sulfate (PROAIR RESPICLICK) [id number] (90 BASE) MCG/ACT AEPB, Inhale 2 puffs into the lungs  every 6 (six) hours as needed. (Patient taking differently: Inhale 2 puffs into the lungs every 6 (six) hours as needed (shortness of breathe). ), Disp: 1 each, Rfl: 3 .  amLODipine (NORVASC) 5 MG tablet, Take 1 tablet (5 mg total) by mouth daily., Disp: 30 tablet, Rfl: 11 .  aspirin EC 81 MG tablet, Take 81 mg by mouth daily., Disp: , Rfl:  .  carvedilol (COREG) 25 MG tablet, Take 1 tablet (25 mg total) by mouth 2 (two) times daily., Disp: 60 tablet, Rfl: 11 .  furosemide (LASIX) 20 MG tablet, Take 2 tablets (40 mg total) by mouth every morning., Disp: 180 tablet, Rfl: 0 .  glipiZIDE (GLUCOTROL) 10 MG tablet, Take 1 tablet (10 mg total) by mouth 2 (two) times daily before a meal., Disp: 60 tablet, Rfl: 5 .  losartan (COZAAR) 100 MG tablet, Take 1 tablet (100 mg total) by mouth daily., Disp: 30 tablet, Rfl: 3 .  lovastatin (MEVACOR) 20 MG tablet, Take 1 tablet (20 mg total) by mouth daily., Disp: 30 tablet, Rfl: 5 .  fluticasone furoate-vilanterol (BREO ELLIPTA) 100-25 MCG/INH AEPB, Inhale 1 puff into the lungs daily., Disp: 1 each, Rfl: 0 .  Spacer/Aero-Holding Chambers (AEROCHAMBER MV) inhaler, Use as instructed, Disp: 1 each, Rfl: 0   Past Medical History  Diagnosis Date  . Diabetes mellitus   . Hypertension   . Erectile dysfunction   . Hyperlipidemia   . Acute systolic CHF (congestive heart failure), NYHA class 2 ([place]) 05/04/2015  . Acute-on-chronic kidney injury ([place]) 05/04/2015    No Known Allergies  Review  of Systems Constitutional:   No  weight loss, night sweats,  Fevers, chills,+ fatigue, or  lassitude.  HEENT:   No headaches,  Difficulty swallowing,  Tooth/dental problems, or  Sore throat,                No sneezing, itching, ear ache, nasal congestion, post nasal drip,   CV:  No chest pain,  Orthopnea, PND, +swelling in lower extremities, anasarca, dizziness, palpitations, syncope.   GI  No heartburn, indigestion, abdominal pain, nausea, vomiting, diarrhea, change in bowel  habits, loss of appetite, bloody stools.   Resp: + shortness of breath with exertion not at rest.  No excess mucus, no productive cough,  No non-productive cough,  No coughing up of blood.  No change in color of mucus.  No wheezing.  No chest wall deformity  Skin: + rash or lesions around neck.  GU: no dysuria, change in color of urine, no urgency or frequency.  No flank pain, no hematuria   MS:  No joint pain or swelling.  No decreased range of motion.  No back pain.  Psych:  No change in mood or affect. No depression or anxiety.  No memory loss.        Objective:   Physical Exam  BP 132/92 mmHg  Pulse 69  Ht 5' 9.5" (1.765 m)  Wt 172 lb 3.2 oz (78.109 kg)  BMI 25.07 kg/m2  SpO2 97%  Physical Exam:  General- No distress,  A&Ox3, appears tired ENT: No sinus tenderness, TM clear, pale nasal mucosa, no oral exudate,no post nasal drip, no LAN Cardiac: S1, S2, regular rate and rhythm, no murmur Chest: No wheeze/ few rales per bases/ dullness; no accessory muscle use, no nasal flaring, no sternal retractions Abd.: Soft Non-tender Ext: No clubbing cyanosis, trace edema Neuro:  normal strength Skin: + rashes to upper neck, warm and dry Psych: normal mood and behavior    Magdalen Spatz, AGACNP-BC [place] Pager # [phone] Assessment & Plan:

## 2015-07-20 NOTE — Patient Instructions (Addendum)
It is nice to meet you today. I am sorry you are not feeling well. We will refer you to dermatology for a biopsy of the rash on your neck to rule out sarcoidosis. Follow up with Dr. Lenna Gilford after you have had the biopsy. Use your Pro Air inhaler as your rescue medication every 4 hours as needed for shortness of breath or wheezing. We will give you a spacer for your Dynegy Inhaler. We will teach you how to use the spacer. We will start Breo today.  Breo 1 puff into the lungs daily. This is your maintenance drug and you take it every day. Continue your lasix as ordered by cardiology. Please contact office for sooner follow up if symptoms do not improve or worsen or seek emergency care

## 2015-07-20 NOTE — Assessment & Plan Note (Signed)
Dyspnea/ Multifactorial: systolic and diastolic dysfunction. R/O Sarcoid Plan: We will refer you to dermatology for a biopsy of the rash on your neck to rule out sarcoidosis. Follow up with Dr. Lenna Gilford after you have had the biopsy. Use your Pro Air inhaler as your rescue medication every 4 hours as needed for shortness of breath or wheezing. We will give you a spacer for your Dynegy Inhaler. We will teach you how to use the spacer. We will start Breo today.  Breo 1 puff into the lungs daily. This is your maintenance drug and you take it every day. Continue your lasix as ordered by cardiology. Please contact office for sooner follow up if symptoms do not improve or worsen or seek emergency care

## 2015-07-22 ENCOUNTER — Encounter: Payer: Self-pay | Admitting: *Deleted

## 2015-07-22 NOTE — Telephone Encounter (Signed)
Left message to call back Letter mailed with results

## 2015-07-25 NOTE — Progress Notes (Signed)
Cardiology Office Note   Date:  07/26/2015   ID:  Brandon Carroll, DOB 06-30-1960, MRN [id number]  PCP:  JEFFERY,CHELLE, PA-C  Cardiologist:   Sharol Harness, MD   Chief Complaint  Patient presents with  . Follow-up    2 weeks  pt states he was having SOB a week ago, Lasix was increased and he has been feeling better; no other Sx.    Patient ID: Brandon Carroll is a 55 y.o. male with hypertension, diabetes mellitus, chronic kidney disease, chronic systolic heart failure, and hyperlipidemia who presents for management of heart failure.    Interval History 07/26/15: After the last appointment Brandon Carroll he was started on amlodipine.  Brandon Carroll followed up with pulmonology and was referred for biopsy of a skin rash for sarcoidosis.  He was also started on a Breo inhaler.  His lasix was also increased to 40 mg 1 week ago.  He thinks this has made the biggest improvement in his breathing.  He notes that he is much less short of breath.  He denies orthopnea or PND.  He has not noted any lower extremity edema.  He weighs himself daily and his weight has been stable.  He limits salt and fluid intake and states that "I only eat healthy now."  He feels that he is now ready for exercise.  Interval History 07/11/15: After his last appointment [name] underwent cardiac catheterization for evaluation of his systolic dysfunction.  He was noted to have 10% lesions in the LAD and RCA.  He also was evaluated by Brandon Edison, PA of pulmonology.  At that appointment his lisinopril was switched to losartan but did not have any further evaluation for his shortness of breath.  Brandon Carroll continues to complain of shortness of breath with minimal exertion.  He gets short of breath trying to take the trash out.  He denies any chest pain, palpitations, LE edema, orthopnea or PND.  His weight has fluctuated between 167-175.  He reports getting a good urinary response to lasix.  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  He was noted to have small pleural effusions on chest xray and renal ultrasound.  He did not have any pulmonary edema.    History of Present Illness 06/10/15: Brandon Carroll presented to the ED on 12/14 with a complaint of productive cough.  He also noted shortness of breath that started 2-3 weeks prior to admission.  He endorsed two pillow orthopnea and exertional shortness of breath.  He was found to have pneumonia and new onset systolic heart failure.  Chest CT was concerning for sarcoidosis and revealed an incidental finding of calcification of two coronary arteries. His LVEF was 45% on echo.  He was started on lasix and discharged at a weight of 166 lb.  He lost 7 lb during that hospitalization.  He followed up with his PCP on 1/23, at which time lisinopril was increased to 40 mg due to poorly-controlled hypertension.  Since leaving the hospital he has been doing well.  At discharge his breathing was much better but he started developing some increased shortness of breath  His weight also increased to 170 lb. He saw his PCP who recommended that he increase lasix to 40 mg bid and referred him to cardiology for further evaluation.  Brandon Carroll has reduced the sodium in  his diet and has not noted any lower extremity edema.  He denies orthopnea or PND.  He also denies chest pain.  However, he has been feeling very fatigued, especially with exertion.    Past Medical History  Diagnosis Date  . Diabetes mellitus   . Hypertension   . Erectile dysfunction   . Hyperlipidemia   . Acute systolic CHF (congestive heart failure), NYHA class 2 ([place]) 05/04/2015  . Acute-on-chronic kidney injury ([place]) 05/04/2015    Past Surgical History  Procedure Laterality Date  . Hernia repair  7th grade    umbilical  . Cardiac catheterization N/A 06/15/2015    Procedure: Left Heart  Cath and Coronary Angiography;  Surgeon: Peter M Martinique, MD;  Location: [facility];  Service: Cardiovascular;  Laterality: N/A;     Current Outpatient Prescriptions  Medication Sig Dispense Refill  . Albuterol Sulfate (PROAIR RESPICLICK) [id number] (90 BASE) MCG/ACT AEPB Inhale 2 puffs into the lungs every 6 (six) hours as needed. (Patient taking differently: Inhale 2 puffs into the lungs every 6 (six) hours as needed (shortness of breathe). ) 1 each 3  . amLODipine (NORVASC) 5 MG tablet Take 1 tablet (5 mg total) by mouth 2 (two) times daily. 180 tablet 3  . aspirin EC 81 MG tablet Take 81 mg by mouth daily.    . carvedilol (COREG) 25 MG tablet Take 1 tablet (25 mg total) by mouth 2 (two) times daily. 60 tablet 11  . fluticasone furoate-vilanterol (BREO ELLIPTA) 100-25 MCG/INH AEPB Inhale 1 puff into the lungs daily. 1 each 0  . furosemide (LASIX) 20 MG tablet Take 2 tablets (40 mg total) by mouth every morning. 180 tablet 0  . glipiZIDE (GLUCOTROL) 10 MG tablet Take 1 tablet (10 mg total) by mouth 2 (two) times daily before a meal. 60 tablet 5  . losartan (COZAAR) 100 MG tablet Take 1 tablet (100 mg total) by mouth daily. 30 tablet 3  . lovastatin (MEVACOR) 20 MG tablet Take 1 tablet (20 mg total) by mouth daily. 30 tablet 5  . Spacer/Aero-Holding Chambers (AEROCHAMBER MV) inhaler Use as instructed 1 each 0   No current facility-administered medications for this visit.    Allergies:   Review of patient's allergies indicates no known allergies.    Social History:  The patient  reports that he has never smoked. He has never used smokeless tobacco. He reports that he does not drink alcohol or use illicit drugs.   Family History:  The patient's family history includes Arthritis in his mother and sister; COPD in his mother and sister; Diabetes in his brother and mother; Hypertension in his father and mother. There is no history of Colon cancer.    ROS:  Please see the history of present  illness.   Otherwise, review of systems are positive for none.   All other systems are reviewed and negative.    PHYSICAL EXAM: VS:  BP 136/84 mmHg  Pulse 90  Ht 5\' 10"  (1.778 m)  Wt 77.52 kg (170 lb 14.4 oz)  BMI 24.52 kg/m2 , BMI Body mass index is 24.52 kg/(m^2). GENERAL:  Well appearing HEENT:  Pupils equal round and reactive, fundi not visualized, oral mucosa unremarkable NECK:  No JVD, carotid upstroke brisk and symmetric LYMPHATICS:  No cervical adenopathy LUNGS:  Clear to auscultation bilaterally HEART:  RRR.  PMI not displaced or sustained,S1 and S2 within normal limits, no S3, no S4, no clicks, no rubs, no murmurs ABD:  Flat, positive  bowel sounds normal in frequency in pitch, no bruits, no rebound, no guarding, no midline pulsatile mass, no hepatomegaly, no splenomegaly EXT:  2 plus pulses throughout, no edema, no cyanosis no clubbing SKIN:  No rashes no nodules NEURO:  Cranial nerves II through XII grossly intact, motor grossly intact throughout PSYCH:  Cognitively intact, oriented to person place and time   EKG:  EKG is not ordered today.  Echo 05/03/15: Study Conclusions  - Left ventricle: The cavity size was normal. Wall thickness was  increased in a pattern of mild LVH. Systolic function was mildly  to moderately reduced. The estimated ejection fraction was in the  range of 40% to 45%. Diffuse hypokinesis. - Left atrium: The atrium was moderately dilated. - Right atrium: The atrium was mildly dilated.   LHC 06/15/15:  Prox LAD to Mid LAD lesion, 10% stenosed.  Prox RCA to Mid RCA lesion, 10% stenosed.  1. No significant CAD 2. Elevated LV filling pressure   Recent Labs: 05/02/2015: B Natriuretic Peptide 970.6* 06/01/2015: ALT 28; TSH 1.770 06/10/2015: Hemoglobin 11.8*; Platelets 266 07/11/2015: BUN 15; Creat 1.44*; Potassium 4.3; Sodium 140    Lipid Panel    Component Value Date/Time   CHOL 203* 05/03/2015 0440   TRIG 76 05/03/2015 0440   HDL  69 05/03/2015 0440   CHOLHDL 2.9 05/03/2015 0440   VLDL 15 05/03/2015 0440   LDLCALC 119* 05/03/2015 0440      Wt Readings from Last 3 Encounters:  07/26/15 77.52 kg (170 lb 14.4 oz)  07/20/15 78.109 kg (172 lb 3.2 oz)  07/18/15 80.287 kg (177 lb)      ASSESSMENT AND PLAN:  # Chronic systolic and diastolic heart failure: # CKD 3:   LVEF 40-45%.  Mr. Wo is doing much better today.  His breathing has improved significantly with the higher dose of lasix.  We will check a CMP today to assess his renal function.  Continue lasix, carvedilol and losartan.  # ?Sarcoidosis: Skin biopsy pending.  # Hypertensive heart disease: Blood pressure well-controlled.  Continue amlodipine, carvedilol and losartan.  # Hyperlipidemia: Continue lovastatin.  We will check lipids and CMP today.  # CV Disease Prevention:  ASCVD 10 year risk is 22%.  Continue aspirin and lovastatin.      Current medicines are reviewed at length with the patient today.  The patient does not have concerns regarding medicines.  The following changes have been made: None.  Labs/ tests ordered today include:   Orders Placed This Encounter  Procedures  . Lipid panel  . Comprehensive Metabolic Panel (CMET)     Disposition:   FU with Thurley Francesconi C. Oval Linsey, MD, [facility] in 6 months.     This note was written with the assistance of speech recognition software.  Please excuse any transcriptional errors.  Signed, Hiba Garry C. Oval Linsey, MD, [facility]  07/26/2015 1:47 PM    [facility]

## 2015-07-26 ENCOUNTER — Encounter: Payer: Self-pay | Admitting: Cardiovascular Disease

## 2015-07-26 ENCOUNTER — Ambulatory Visit (INDEPENDENT_AMBULATORY_CARE_PROVIDER_SITE_OTHER): Payer: BLUE CROSS/BLUE SHIELD | Admitting: Cardiovascular Disease

## 2015-07-26 VITALS — BP 136/84 | HR 90 | Ht 70.0 in | Wt 170.9 lb

## 2015-07-26 DIAGNOSIS — I5042 Chronic combined systolic (congestive) and diastolic (congestive) heart failure: Secondary | ICD-10-CM

## 2015-07-26 DIAGNOSIS — E785 Hyperlipidemia, unspecified: Secondary | ICD-10-CM

## 2015-07-26 DIAGNOSIS — I110 Hypertensive heart disease with heart failure: Secondary | ICD-10-CM | POA: Diagnosis not present

## 2015-07-26 MED ORDER — AMLODIPINE BESYLATE 5 MG PO TABS: 5.0000 mg | ORAL_TABLET | Freq: Two times a day (BID) | ORAL | Status: DC

## 2015-07-26 NOTE — Patient Instructions (Signed)
Medication Instructions: Your physician recommends that you continue on your current medications as directed. Please refer to the Current Medication list given to you today.  Labwork: Lip/cmet downstairs on first floor at [facility] labs  Testing/Procedures: none  Follow-Up: Your physician wants you to follow-up in: 6 month ov You will receive a reminder letter in the mail two months in advance. If you don't receive a letter, please call our office to schedule the follow-up appointment.  If you need a refill on your cardiac medications before your next appointment, please call your pharmacy.

## 2015-07-27 ENCOUNTER — Ambulatory Visit: Payer: BLUE CROSS/BLUE SHIELD | Admitting: Pulmonary Disease

## 2015-07-27 LAB — COMPREHENSIVE METABOLIC PANEL
ALBUMIN: 2.9 g/dL — AB (ref 3.6–5.1)
ALT: 31 U/L (ref 9–46)
AST: 34 U/L (ref 10–35)
Alkaline Phosphatase: 225 U/L — ABNORMAL HIGH (ref 40–115)
BUN: 18 mg/dL (ref 7–25)
CHLORIDE: 104 mmol/L (ref 98–110)
CO2: 27 mmol/L (ref 20–31)
CREATININE: 1.45 mg/dL — AB (ref 0.70–1.33)
Calcium: 9 mg/dL (ref 8.6–10.3)
Glucose, Bld: 154 mg/dL — ABNORMAL HIGH (ref 65–99)
Potassium: 4.2 mmol/L (ref 3.5–5.3)
SODIUM: 140 mmol/L (ref 135–146)
Total Bilirubin: 0.3 mg/dL (ref 0.2–1.2)
Total Protein: 6.7 g/dL (ref 6.1–8.1)

## 2015-07-27 LAB — LIPID PANEL
CHOL/HDL RATIO: 3.4 ratio (ref <=5.0)
Cholesterol: 254 mg/dL — ABNORMAL HIGH (ref 125–200)
HDL: 74 mg/dL (ref >=40)
LDL Cholesterol: 158 mg/dL — ABNORMAL HIGH (ref <130)
Triglycerides: 109 mg/dL (ref <150)
VLDL: 22 mg/dL (ref <30)

## 2015-08-11 ENCOUNTER — Other Ambulatory Visit (INDEPENDENT_AMBULATORY_CARE_PROVIDER_SITE_OTHER): Payer: BLUE CROSS/BLUE SHIELD

## 2015-08-11 ENCOUNTER — Encounter: Payer: Self-pay | Admitting: Pulmonary Disease

## 2015-08-11 ENCOUNTER — Ambulatory Visit (INDEPENDENT_AMBULATORY_CARE_PROVIDER_SITE_OTHER): Payer: BLUE CROSS/BLUE SHIELD | Admitting: Pulmonary Disease

## 2015-08-11 VITALS — BP 146/94 | HR 75 | Temp 98.7°F | Ht 70.0 in | Wt 174.0 lb

## 2015-08-11 DIAGNOSIS — I429 Cardiomyopathy, unspecified: Secondary | ICD-10-CM | POA: Diagnosis not present

## 2015-08-11 DIAGNOSIS — I10 Essential (primary) hypertension: Secondary | ICD-10-CM | POA: Diagnosis not present

## 2015-08-11 DIAGNOSIS — R938 Abnormal findings on diagnostic imaging of other specified body structures: Secondary | ICD-10-CM

## 2015-08-11 DIAGNOSIS — N183 Chronic kidney disease, stage 3 (moderate): Secondary | ICD-10-CM

## 2015-08-11 DIAGNOSIS — I5022 Chronic systolic (congestive) heart failure: Secondary | ICD-10-CM | POA: Diagnosis not present

## 2015-08-11 DIAGNOSIS — E1122 Type 2 diabetes mellitus with diabetic chronic kidney disease: Secondary | ICD-10-CM

## 2015-08-11 DIAGNOSIS — D508 Other iron deficiency anemias: Secondary | ICD-10-CM

## 2015-08-11 DIAGNOSIS — R9389 Abnormal findings on diagnostic imaging of other specified body structures: Secondary | ICD-10-CM

## 2015-08-11 DIAGNOSIS — E0821 Diabetes mellitus due to underlying condition with diabetic nephropathy: Secondary | ICD-10-CM

## 2015-08-11 LAB — CBC WITH DIFFERENTIAL/PLATELET
Basophils Absolute: 0.1 10*3/uL (ref 0.0–0.1)
Basophils Relative: 1.8 % (ref 0.0–3.0)
EOS ABS: 0.3 10*3/uL (ref 0.0–0.7)
Eosinophils Relative: 4.1 % (ref 0.0–5.0)
HCT: 39.7 % (ref 39.0–52.0)
HEMOGLOBIN: 13.1 g/dL (ref 13.0–17.0)
Lymphocytes Relative: 11.5 % — ABNORMAL LOW (ref 12.0–46.0)
Lymphs Abs: 0.8 10*3/uL (ref 0.7–4.0)
MCHC: 32.9 g/dL (ref 30.0–36.0)
MCV: 76.6 fl — ABNORMAL LOW (ref 78.0–100.0)
Monocytes Absolute: 0.9 10*3/uL (ref 0.1–1.0)
Monocytes Relative: 12.7 % — ABNORMAL HIGH (ref 3.0–12.0)
Neutro Abs: 5.1 10*3/uL (ref 1.4–7.7)
Neutrophils Relative %: 69.9 % (ref 43.0–77.0)
Platelets: 276 10*3/uL (ref 150.0–400.0)
RBC: 5.18 Mil/uL (ref 4.22–5.81)
RDW: 14.4 % (ref 11.5–15.5)
WBC: 7.3 10*3/uL (ref 4.0–10.5)

## 2015-08-11 LAB — IBC PANEL
IRON: 89 ug/dL (ref 42–165)
Saturation Ratios: 29.6 % (ref 20.0–50.0)
TRANSFERRIN: 215 mg/dL (ref 212.0–360.0)

## 2015-08-11 LAB — BASIC METABOLIC PANEL
BUN: 28 mg/dL — ABNORMAL HIGH (ref 6–23)
CO2: 29 meq/L (ref 19–32)
Calcium: 9.2 mg/dL (ref 8.4–10.5)
Chloride: 100 mEq/L (ref 96–112)
Creatinine, Ser: 1.62 mg/dL — ABNORMAL HIGH (ref 0.40–1.50)
GFR: 57.78 mL/min — ABNORMAL LOW (ref >60.00)
GLUCOSE: 318 mg/dL — AB (ref 70–99)
POTASSIUM: 4.3 meq/L (ref 3.5–5.1)
SODIUM: 134 meq/L — AB (ref 135–145)

## 2015-08-11 LAB — B12 AND FOLATE PANEL
Folate: 5 ng/mL — ABNORMAL LOW (ref >5.9)
Vitamin B-12: 489 pg/mL (ref 211–911)

## 2015-08-11 LAB — FERRITIN: FERRITIN: 59.9 ng/mL (ref 22.0–322.0)

## 2015-08-11 NOTE — Progress Notes (Signed)
Subjective:     Patient ID: Brandon Carroll, male   DOB: 1961-04-24, 55 y.o.   MRN: [id number]  HPI  ~  June 08, 2015:  Initial pulmonary consult by Brandon Carroll>        20 y/o man referred by Brandon Mons PA, [facility] for a pulmonary evaluation to r/o Sarcoidosis;  He has a history of a cardiomyopathy, chronic systolic CHF w/ WU=98-11%, HBP, DM, HL, etc;  He was Brandon Carroll 12/19 - 05/05/15 by Brandon Carroll after presenting w/ dyspnea, PND & 2 pillow orthopnea;  CXR showed bilat effusions, some incr markings and prob LLL atx;  2DEcho showed a cardiomyopathy w/ EF=40-45%;  Labs showed elev BNP=971, mild renal insuffic w/ Cr=1.4-1.6, mild anemia w/ Hg=11-12 and small cells w/ MCV=76;  Pt was diuresed w/ Lasix & lost 7# of fluid w/ improvement in his breathing (also given empiric Levaquin rx) and he was disch for outpt follow up... CT Chest performed 05/04/15 showed scattered bilat pulm nodules (largest=44m in RUL) and mild adenopathy (R paratrach/ subcarinal/ aortopulm window/ & ?bilat hilar regions), norm heart size, coronary atherosclerosis noted, bilat effusions and basilar atx...        For his part the patient states that his SOB has completely resolved (w/ treatment of his CHF);  He notes min cough, small amt of clear sput, no hemoptysis, denies f/c/s, no CP;  He notes some DOE but able to perform his duties at [facility] & down stairs now...   Smoking Hx>  He is a never smoker...  Pulmonary Hx>  He has no prev hx of lung disease- no hx asthma, bronchitis, pneumonia, Tb or exposure.  Medical Hx>  As below-- HBP, CHF/cardiomyopathy (never seen by Brandon Carroll), DM, HL, RI, anemia...  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  Current Meds>  AlbutHFA prn, Mucinex, Coreg, Calan, Lisinopril, Lasix, Mevacor, Glucotrol EXAM shows Afeb, VSS, O2sat=98% on RA;  Heent- neg, mallampati2;  Chest- clear w/o w/r/r;  Heart-  RR w/o m/r/g;  Abd- soft, nontender, neg;  Ext- neg w/o c/c/e;  Neuro- intact w/o focal deficits...  CXR 05/02/15 showed bilat effusions, some incr markings and prob LLL atx  EKG 05/02/15 showed NSR, rate73/min, +STTWA- consider inferolat ischemia...  2DEcho 05/03/15 showed mild LVH, mod reduced LVF w/ EF=40-45% & diffuse HK; MV & AoV are wnl, LA is mod dilated, RA is mildly dilated  Labs in Epic from 04/2015 showed elev BNP=971, mild renal insuffic w/ Cr=1.4-1.6, mild anemia w/ Hg=11-12 and small cells w/ MCV=76   CT Chest performed 05/04/15 showed scattered bilat pulm nodules (largest=6753min RUL) and mild adenopathy (R paratrach/ subcarinal/ aortopulm window/ & ?bilat hilar regions), norm heart size, coronary atherosclerosis noted, bilat effusions and basilar atx...    Spirometry 06/08/15 showed FVC=3.36 (81%), FEV1=2.62 (79%), %1sec=78, mid-flows are sl reduced at 61% predicted; This is c/w very mild airflow obstruction esp in small airways, can't r/o superimposed restriction w/o LV measurement.   Ambulatory Oximetry 06/08/15>  O2sat=100% on RA at rest w/ pulse=66/min; He ambulated 3 laps w/o difficulty, lowest O2sat=99% w/ pulse=74/min...  IMP >>    1) Abn CXR & CTChest w/ scattered bilat pulm nodules (largest=53m63mn RUL) and mild adenopathy seen (R paratrach/ subcarinal/ aortopulm window/ & ?bilat hilar regions)> the Adm CXR 04/2015 appeared to me to be c/w CHF w/ bilat effusions, interstitial edema, and some LLL atx (clinical  picture was not that of pneumonia w/o fever/ purulent sput, normal WBC ct, neg pneumococcal & legionella Ag, etc); radiology wondered about sarcoidosis, pt was on Lisinopril at time of Adm & therefore no ACE level checked => we switched Lisinopril to LOSARTAN100 today...     2) HBP> controlled on Coreg 12.5- 1/2Bid, Calan120Bid, Lisinopril40, Lasix20-2Bid => we switched the ACE to ARB as above...    3) Cardiomyopathy w/ chronic systolic CHF and PP=50-93% by 2DEcho> BNP in  hosp was 971 & he diuresed 7+lbs of fluid w/ improvement in his breathing; he has not seen Brandon Carroll & they didn't call consult in [place]; CTChest alluded to calcif in coronaries and he needs Brandon Carroll consult for Cath and on-going treatment for his CHF.Marland KitchenMarland Kitchen     4) Hyperlipidemia> on Mevacor20 per PCP.Marland KitchenMarland Kitchen    5) Diabetes Mellitus> he has seen DrEllison in the past, A1c=7.0 04/2015 Hosp, urinalysis was pos for proteinuria; Prev on LANTUS & Metformin, now just on Glucotrol?    6) Renal insuffic> baseline Cr appears to be ~1.2-1.3, Cr in [place] was 1.4-1.6 w/ diuresis, then back to 1.3    7) Elev Alk Phos> this fractionated to liver but GOT/GPT are wnl    8) Anemia w/ small cells> Hg in Hosp was 11.9 w/ MCV=76; baseline in Epic ~12-13 w/o Iron studies or anemia eval previously done...  PLAN >>     Brandon Carroll appears to have a major heart problem and needs to see Cardiology 1st- we will refer ASAP so they can manage his cardiomyopathy & eval for poss underlying CAD;  From the pulmonary perspective he is a never smoker & has no prior hx of any lung problem;  He has an abn CXR & CT Chest as noted, only mild PFT abnormality & I am not yet ready to commit him to a lung Bx or mediastinoscopy & bx to cement the dx of sarcoidosis;  For now we will switch his Lisinopril to Losartan (so we can check ACE level later) and we will plan ROV in 2-38mow/ CXR at that time (he will need f/u CT at a later date).   ~  August 11, 2015:  224moOV & Brandon Carroll saw Brandon Carroll, Brandon Carroll & her notes are reviewed> Hypertensive heart dis, chr sys & diastolic CHF, mult coronary risk factors;  They adjusted his BP meds & did a heart CATH 06/15/15 showing trivial CAD w/ 10% LAD & RCA, and elev LV filling pressures;  DOE w/ min exertion persisted and his Lasix was adjusted w/ improvement;  Off the ACE inhibitor his ACE level was checked and ret at 41 (8-52) confirming outr opinion that he does not have active sarcoidosis;  He was placed on Breo daily & Albut HFA  for prn use- ?any benefit... We reviewed the following medical problems during today's office visit >>     Abn CXR & CTChest w/ scattered bilat pulm nodules (largest=73m63mn RUL) and mild adenopathy seen (R paratrach/ subcarinal/ aortopulm window/ & ?bilat hilar regions)> the Adm CXR appeared to me to be c/w CHF w/ bilat effusions, interstitial edema, and some LLL atx (clinical picture was not that of pneumonia w/o fever/ purulent sput, normal WBC ct, neg pneumococcal & legionella Ag, etc); radiology wondered about sarcoidosis, pt was on Lisinopril at time of Adm & therefore no ACE level checked => we switched Lisinopril to LOSARTAN100 & checked ACE level 07/05/15=> 41 (norm8-52) confirming our clinical impression of no active sarcoid... He wqas placed on BREO &  ALbutHFA rescue inhaler in the interim as a trial- no real change noted (Spirometry 05/2015 was OK x mild small airways dis).    HBP> better controlled on Coreg 25Bid, Amlod5, Losar100, Lasix40/d;  BP= 146/94 and he says breathing is improved, feeling better, no CP/ palpit/ edema...    Cardiomyopathy w/ chronic systolic CHF and SW=96-75% by 2DEcho> BNP in hosp was 971 & he diuresed 7+lbs of fluid w/ improvement in his breathing; CTChest alluded to calcif in coronaries and he was seen by Ivar Drape- referred for CATH 06/10/15 showing triv CAD (10% LAD & RCA) and elev LV filling pressures c/w a non-ischemic cardiomyopathy;  He is improved w/ better BP control & CHF rx per Brandon Carroll...    Hyperlipidemia> prev on Mevacor20 w/ FLP 07/26/15 showing TChol 254, TG 109, HDL 74, LDL 158; Brandon Carroll changed Mevacor20 to [place]...    Diabetes Mellitus> he has seen DrEllison in the past, A1c=7.0 04/2015 Hosp, urinalysis was pos for proteinuria; Prev on LANTUS & Metformin, now just on Glucotrol10Bid, BS=150-160 w/ A1c=7.4 (06/21/15)    Renal insuffic> baseline Cr appears to be ~1.2-1.3, Cr in [place] was 1.4-1.6 w/ diuresis ^ improvement in CHF...    Elev Alk Phos> this  fractionated to liver but GOT/GPT are wnl; PCP did Abd Sonar 07/08/15 showing 3.6 cm GB polyp, no stones, ducts ok & liver ok...    Anemia w/ small cells> Hg in Hosp was 11.9 w/ MCV=76; baseline in Epic ~12-13; repeat labs here 08/11/15=> Hb=13.1, MCV=77, Fe=89 (30%sat), Ferritin=60, B12=489, Folate=5.0 & rec OTC MVI + Folic Acid...    Rash>  He saw [facility] who felt rash was cutaneous fungal infection, not sarcoid, KOH was pos & rash resolved w/ Fluconazole & topical cream... EXAM shows Afeb, VSS (BP=146/94), O2sat=99% on RA;  Heent- neg, mallampati2;  Chest- clear w/o w/r/r;  Heart- RR w/o m/r/g;  Abd- soft, nontender, neg;  Ext- neg w/o c/c/e;  Neuro- intact w/o focal deficits...  LABS 08/11/15>  [place]- BS=318, Cr=1.62;  Hb=13.1, MCV=77, Fe=89 (30%sat), Ferritin=60, B12=489, Folate=5.0 & rec OTC MVI + Folic Acid... IMP/PLAN >>  His breathing is much improved on Lasix diuresis & adjustment in BP/ CHF meds- doubt much benefit from [facility];  F/u labs today show BS=318 on his Glucotrol alone- needs f/u DrEllison for Endocrine;  Anemia labs show Hg back to norm at 13 but cells still small & HAVE ALWAYS BEEN SMALL suggesting prob thallassemia minor, but Folic level sl low so we will rec MVI + OTC Folate... Plan is to see him back in June- time for CXR & f/u CT Chest to check the nodules seen 04/2015...     Past Medical History  Diagnosis Date  . Diabetes mellitus   . Hypertension   . Erectile dysfunction   . Hyperlipidemia   . Acute systolic CHF (congestive heart failure), NYHA class 2 ([place]) 05/04/2015  . Acute-on-chronic kidney injury ([place]) 05/04/2015    Past Surgical History  Procedure Laterality Date  . Hernia repair  7th grade    umbilical  . Cardiac catheterization N/A 06/15/2015    Procedure: Left Heart Cath and Coronary Angiography;  Surgeon: Peter M Martinique, MD;  Location: [facility];  Service: Cardiovascular;  Laterality: N/A;    Outpatient Encounter Prescriptions as of 08/11/2015   Medication Sig  . Albuterol Sulfate (PROAIR RESPICLICK) 916 (90 BASE) MCG/ACT AEPB Inhale 2 puffs into the lungs every 6 (six) hours as needed. (Patient taking differently: Inhale 2 puffs into the lungs every  6 (six) hours as needed (shortness of breathe). )  . amLODipine (NORVASC) 5 MG tablet Take 1 tablet (5 mg total) by mouth 2 (two) times daily.  Marland Kitchen aspirin EC 81 MG tablet Take 81 mg by mouth daily.  . carvedilol (COREG) 25 MG tablet Take 1 tablet (25 mg total) by mouth 2 (two) times daily.  . fluconazole (DIFLUCAN) 200 MG tablet Take as directed  . fluticasone furoate-vilanterol (BREO ELLIPTA) 100-25 MCG/INH AEPB Inhale 1 puff into the lungs daily.  . furosemide (LASIX) 20 MG tablet Take 2 tablets (40 mg total) by mouth every morning.  Marland Kitchen glipiZIDE (GLUCOTROL) 10 MG tablet Take 1 tablet (10 mg total) by mouth 2 (two) times daily before a meal.  . losartan (COZAAR) 100 MG tablet Take 1 tablet (100 mg total) by mouth daily.  Marland Kitchen lovastatin (MEVACOR) 20 MG tablet Take 1 tablet (20 mg total) by mouth daily.  Marland Kitchen Spacer/Aero-Holding Chambers (AEROCHAMBER MV) inhaler Use as instructed   No facility-administered encounter medications on file as of 08/11/2015.    No Known Allergies   Immunization History  Administered Date(s) Administered  . Influenza, Seasonal, Injecte, Preservative Fre 07/22/2012  . Influenza,inj,Quad PF,36+ Mos 08/04/2014  . Pneumococcal Polysaccharide-23 07/22/2012, 05/03/2015  . Tdap 05/14/2010    Current Medications, Allergies, Past Medical History, Past Surgical History, Family History, and Social History were reviewed in [employer] record.   Review of Systems             All symptoms NEG except where BOLDED >>  Constitutional:  F/C/S, fatigue, anorexia, unexpected weight change. HEENT:  HA, visual changes, hearing loss, earache, nasal symptoms, sore throat, mouth sores, hoarseness. Resp:  cough, sputum, hemoptysis; SOB, tightness,  wheezing. Cardio:  CP, palpit, DOE, orthopnea, edema. GI:  N/V/D/C, blood in stool; reflux, abd pain, distention, gas. GU:  dysuria, freq, urgency, hematuria, flank pain, voiding difficulty. MS:  joint pain, swelling, tenderness, decr ROM; neck pain, back pain, etc. Neuro:  HA, tremors, seizures, dizziness, syncope, weakness, numbness, gait abn. Skin:  suspicious lesions or skin rash. Heme:  adenopathy, bruising, bleeding. Psyche:  confusion, agitation, sleep disturbance, hallucinations, anxiety, depression suicidal.   Objective:   Physical Exam       Vital Signs:  Reviewed...  General:  WD, WN, 55 y/o BM in NAD; alert & oriented; pleasant & cooperative... HEENT:  [place]/AT; Conjunctiva- pink, Sclera- nonicteric, EOM-wnl, PERRLA, Fundi-benign; EACs-clear, TMs-wnl; NOSE-clear; THROAT-clear & wnl. Neck:  Supple w/ full ROM; no JVD; normal carotid impulses w/o bruits; no thyromegaly or nodules palpated; no lymphadenopathy. Chest:  Clear to P & A; without wheezes, rales, or rhonchi heard. Heart:  Regular Rhythm; norm S1 & S2 without murmurs, rubs, or gallops detected. Abdomen:  Soft & nontender- no guarding or rebound; normal bowel sounds; no organomegaly or masses palpated. Ext:  Normal ROM; without deformities or arthritic changes; no varicose veins, venous insuffic, or edema;  Pulses intact w/o bruits. Neuro:  CNs II-XII intact; motor testing normal; sensory testing normal; gait normal & balance OK. Derm:  No lesions noted; no rash etc. Lymph:  No cervical, supraclavicular, axillary, or inguinal adenopathy palpated.   Assessment:      IMP >>    Abn CXR & CTChest w/ scattered bilat pulm nodules (largest=22m in RUL) and mild adenopathy seen (R paratrach/ subcarinal/ aortopulm window/ & ?bilat hilar regions)>  He may have underlying Sarcoid but it appears inactive & we will follow- he is due for f/u CT Chest to  check nodules & nodes in June2017...    HBP> on Coreg 25Bid, Amlod5, Losar100,  Lasix40/d> continue same & f/u w/ PCP & Brandon Carroll...    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    Hyperlipidemia> prev on Mevacor20 w/ FLP 07/26/15 showing TChol 254, TG 109, HDL 74, LDL 158; Brandon Carroll changed Mevacor20 to [place]...    Diabetes Mellitus> saw DrEllison in past, A1c=7.0 04/2015 Hosp, urinalysis was pos for proteinuria; Prev on LANTUS & Metformin, now just on Glucotrol10Bid, BS was150-160 w/ A1c=7.4 (06/21/15) but BS=318 today & needs f/u Endocrine.    Renal insuffic> baseline Cr appears to be ~1.2-1.3, Cr in Hosp was 1.4-1.6 w/ diuresis & improvement in CHF...    Elev Alk Phos> this fractionated to liver but GOT/GPT are wnl; PCP did Abd Sonar 07/08/15 showing 3.6 cm GB polyp, no stones, ducts ok & liver ok...    Anemia w/ small cells> Hg in Hosp was 11.9 w/ MCV=76; baseline in Epic ~12-13; repeat labs here 08/11/15=> Hb=13.1, MCV=77, Fe=89 (30%sat), Ferritin=60, B12=489, Folate=5.0 & rec OTC MVI + Folic Acid...    Rash>  He saw [facility] who felt rash was cutaneous fungal infection, not sarcoid, KOH was pos & rash resolved w/ Fluconazole & topical cream...  PLAN >>  06/08/15>  Brandon Carroll appears to have a major heart problem and needs to see Cardiology 1st- we will refer ASAP so they can manage his cardiomyopathy & eval for poss underlying CAD;  From the pulmonary perspective he is a never smoker & has no prior hx of any lung problem;  He has an abn CXR & CT Chest as noted, only mild PFT abnormality & I am not yet ready to commit him to a lung Bx or mediastinoscopy & bx to cement the dx of sarcoidosis;  For now we will switch his Lisinopril to Losartan (so we can check ACE level later) and we will plan ROV in 2-41mow/ CXR at that time (he will need f/u CT at a later date) 3/30>  His breathing is much improved on Lasix diuresis & adjustment in BP/ CHF meds- [facility]  F/u labs today show BS=318 on his Glucotrol alone- needs f/u DrEllison for Endocrine;  Anemia labs show Hg back to norm at 13 but cells still small & HAVE ALWAYS BEEN SMALL suggesting prob thallassemia minor, but Folic level sl low so we will rec MVI + OTC Folate... Plan is to see him back in June- time for CXR & f/u CT Chest to check the nodules seen 04/2015...      Plan:     Patient's Medications  New Prescriptions   No medications on file  Previous Medications   ALBUTEROL SULFATE (PROAIR RESPICLICK) 1546(90 BASE) MCG/ACT AEPB    Inhale 2 puffs into the lungs every 6 (six) hours as needed.   AMLODIPINE (NORVASC) 5 MG TABLET    Take 1 tablet (5 mg total) by mouth 2 (two) times daily.   ASPIRIN EC 81 MG TABLET    Take 81 mg by mouth daily.   CARVEDILOL (COREG) 25 MG TABLET    Take 1 tablet (25 mg total) by mouth 2 (two) times daily.   FLUCONAZOLE (DIFLUCAN) 200 MG TABLET    Take as directed   FLUTICASONE FUROATE-VILANTEROL (BREO ELLIPTA) 100-25 MCG/INH AEPB    Inhale 1 puff into the  lungs daily.   FUROSEMIDE (LASIX) 20 MG TABLET    Take 2 tablets (40 mg total) by mouth every morning.   GLIPIZIDE (GLUCOTROL) 10 MG TABLET    Take 1 tablet (10 mg total) by mouth 2 (two) times daily before a meal.   LOSARTAN (COZAAR) 100 MG TABLET    Take 1 tablet (100 mg total) by mouth daily.   LOVASTATIN (MEVACOR) 20 MG TABLET    Take 1 tablet (20 mg total) by mouth daily.   SPACER/AERO-HOLDING CHAMBERS (AEROCHAMBER MV) INHALER    Use as instructed  Modified Medications   No medications on file  Discontinued Medications   No medications on file

## 2015-08-11 NOTE — Patient Instructions (Signed)
Today we updated your med list in our EPIC system...    Continue your current medications the same...  Today we checked some f/u blood work...    We will contact you w/ the results when available...   Call for any questions...  Let's plan a follow up visit in 91mo, sooner if needed for problems.Marland KitchenMarland Kitchen

## 2015-08-16 ENCOUNTER — Other Ambulatory Visit: Payer: Self-pay | Admitting: Pulmonary Disease

## 2015-08-16 DIAGNOSIS — E0821 Diabetes mellitus due to underlying condition with diabetic nephropathy: Secondary | ICD-10-CM

## 2015-08-16 MED ORDER — ATORVASTATIN CALCIUM 40 MG PO TABS: 40.0000 mg | ORAL_TABLET | Freq: Every day | ORAL | Status: DC

## 2015-08-16 NOTE — Progress Notes (Signed)
Quick Note:  Called and spoke to pt. Informed him of the results and recs per SN. Order placed to re-refer pt back to Dr. Loanne Drilling and new Lipitor rx sent to preferred pharmacy. Pt verbalized understanding and denied any further questions or concerns at this time.   ______

## 2015-09-06 ENCOUNTER — Ambulatory Visit: Payer: BLUE CROSS/BLUE SHIELD | Admitting: Pulmonary Disease

## 2015-09-13 ENCOUNTER — Ambulatory Visit: Payer: BLUE CROSS/BLUE SHIELD | Admitting: Physician Assistant

## 2015-09-19 ENCOUNTER — Encounter: Payer: Self-pay | Admitting: Endocrinology

## 2015-09-24 ENCOUNTER — Other Ambulatory Visit: Payer: Self-pay | Admitting: Physician Assistant

## 2015-09-26 NOTE — Telephone Encounter (Signed)
Meds ordered this encounter  Medications  . furosemide (LASIX) 20 MG tablet    Sig: TAKE 2 TABLETS (40 MG TOTAL) BY MOUTH EVERY MORNING.    Dispense:  180 tablet    Refill:  0

## 2015-09-26 NOTE — Telephone Encounter (Signed)
Chelle, when you saw pt in March you were concerned about his inc SOB and pt was sch to see cardio and pulmon specialists. Do you want to RF this med, or does one of the specialists need to manage it?

## 2015-10-04 ENCOUNTER — Ambulatory Visit (INDEPENDENT_AMBULATORY_CARE_PROVIDER_SITE_OTHER): Payer: BLUE CROSS/BLUE SHIELD | Admitting: Physician Assistant

## 2015-10-04 ENCOUNTER — Encounter: Payer: Self-pay | Admitting: Physician Assistant

## 2015-10-04 VITALS — BP 140/100 | HR 80 | Temp 98.9°F | Resp 16 | Ht 69.0 in | Wt 172.8 lb

## 2015-10-04 DIAGNOSIS — D508 Other iron deficiency anemias: Secondary | ICD-10-CM

## 2015-10-04 DIAGNOSIS — N183 Chronic kidney disease, stage 3 unspecified: Secondary | ICD-10-CM

## 2015-10-04 DIAGNOSIS — E291 Testicular hypofunction: Secondary | ICD-10-CM | POA: Diagnosis not present

## 2015-10-04 DIAGNOSIS — R7989 Other specified abnormal findings of blood chemistry: Secondary | ICD-10-CM | POA: Insufficient documentation

## 2015-10-04 DIAGNOSIS — E785 Hyperlipidemia, unspecified: Secondary | ICD-10-CM

## 2015-10-04 DIAGNOSIS — E1122 Type 2 diabetes mellitus with diabetic chronic kidney disease: Secondary | ICD-10-CM

## 2015-10-04 DIAGNOSIS — I10 Essential (primary) hypertension: Secondary | ICD-10-CM | POA: Diagnosis not present

## 2015-10-04 DIAGNOSIS — E0821 Diabetes mellitus due to underlying condition with diabetic nephropathy: Secondary | ICD-10-CM | POA: Diagnosis not present

## 2015-10-04 HISTORY — DX: Other specified abnormal findings of blood chemistry: R79.89

## 2015-10-04 LAB — HEMOGLOBIN A1C
Hgb A1c MFr Bld: 11.3 % — ABNORMAL HIGH (ref <5.7)
MEAN PLASMA GLUCOSE: 278 mg/dL

## 2015-10-04 MED ORDER — ROSUVASTATIN CALCIUM 20 MG PO TABS: 20.0000 mg | ORAL_TABLET | Freq: Every day | ORAL | Status: DC

## 2015-10-04 NOTE — Patient Instructions (Addendum)
You need to call Dr. Blenda Mounts office to schedule a visit in September.  The urology office will contact you to schedule there.    IF you received an x-ray today, you will receive an invoice from [facility]. Please contact [facility] at [phone] with questions or concerns regarding your invoice.   IF you received labwork today, you will receive an invoice from [employer]. Please contact [facility] at [phone] with questions or concerns regarding your invoice.   Our billing staff will not be able to assist you with questions regarding bills from these companies.  You will be contacted with the lab results as soon as they are available. The fastest way to get your results is to activate your My Chart account. Instructions are located on the last page of this paperwork. If you have not heard from [place] regarding the results in 2 weeks, please contact this office.

## 2015-10-07 NOTE — Progress Notes (Signed)
Patient ID: Brandon Carroll, male    DOB: [birth year], 55 y.o.   MRN: [id number]  PCP: Sherion Dooly, PA-C  Subjective:   Chief Complaint  Patient presents with  . Follow-up    HTN    HPI Presents for evaluation of Diabetes, HTN, CKD.  He is feeling good in general. He is frustrated that the endocrinologist "is only interested in treating my diabetes and won't give me testosterone." Desires that I do it or send him to someone else who will.  Pulmonology evaluation has concluded that he does NOT have sarcoidosis.  Denies CP, SOB, HA, dizziness, nausea, vomiting. No LE edema. No DOE. No visual disturbances. He remains fatigued and with difficulty getting and maintaining erections.   No longer on metformin. Invokana was too expensive. A1C has been higher than desired, but acceptable, <8%.    Review of Systems  Constitutional: Negative for activity change, appetite change, fatigue and unexpected weight change.  HENT: Negative for congestion, dental problem, ear pain, hearing loss, mouth sores, postnasal drip, rhinorrhea, sneezing, sore throat, tinnitus and trouble swallowing.   Eyes: Negative for photophobia, pain, redness and visual disturbance.  Respiratory: Negative for cough, chest tightness and shortness of breath.   Cardiovascular: Negative for chest pain, palpitations and leg swelling.  Gastrointestinal: Negative for nausea, vomiting, abdominal pain, diarrhea, constipation and blood in stool.  Genitourinary: Negative for dysuria, urgency, frequency and hematuria.  Musculoskeletal: Negative for myalgias, arthralgias, gait problem and neck stiffness.  Skin: Negative for rash.  Neurological: Negative for dizziness, speech difficulty, weakness, light-headedness, numbness and headaches.  Hematological: Negative for adenopathy.  Psychiatric/Behavioral: Negative for confusion and sleep disturbance. The patient is not nervous/anxious.        Patient Active Problem  List   Diagnosis Date Noted  . Low testosterone 10/04/2015  . Erectile dysfunction 06/21/2015  . CKD stage 3 secondary to diabetes ([place]) 06/15/2015  . Chronic systolic CHF (congestive heart failure) ([place]) 06/15/2015  . Abnormal CT of the chest 06/08/2015  . Secondary cardiomyopathy ([place]) 06/08/2015  . Anemia 06/06/2015  . Dyspnea   . Accelerated hypertension 05/04/2015  . Leukocytosis 05/04/2015  . Diabetes mellitus with renal complications ([place]) [id number]  . Hyperlipidemia with target LDL less than 70 09/18/2011     Prior to Admission medications   Medication Sig Start Date End Date Taking? Authorizing Provider  Albuterol Sulfate (PROAIR RESPICLICK) [id number] (90 BASE) MCG/ACT AEPB Inhale 2 puffs into the lungs every 6 (six) hours as needed. 04/27/15  Yes Robyn Haber, MD  amLODipine (NORVASC) 5 MG tablet Take 1 tablet (5 mg total) by mouth 2 (two) times daily. 07/26/15  Yes Skeet Latch, MD  aspirin EC 81 MG tablet Take 81 mg by mouth daily.   Yes Historical Provider, MD  carvedilol (COREG) 25 MG tablet Take 1 tablet (25 mg total) by mouth 2 (two) times daily. 06/10/15  Yes Skeet Latch, MD  furosemide (LASIX) 20 MG tablet TAKE 2 TABLETS (40 MG TOTAL) BY MOUTH EVERY MORNING. 09/26/15  Yes Xitlalic Maslin, PA-C  glipiZIDE (GLUCOTROL) 10 MG tablet Take 1 tablet (10 mg total) by mouth 2 (two) times daily before a meal. 06/21/15  Yes Alanii Ramer, PA-C  losartan (COZAAR) 100 MG tablet Take 1 tablet (100 mg total) by mouth daily. 06/08/15  Yes Noralee Space, MD  Spacer/Aero-Holding Chambers (AEROCHAMBER MV) inhaler Use as instructed 07/20/15  Yes Magdalen Spatz, NP  fluticasone furoate-vilanterol (BREO ELLIPTA) 100-25 MCG/INH AEPB Inhale 1 puff into the lungs daily. Patient  not taking: Reported on 10/04/2015 07/20/15   Magdalen Spatz, NP  rosuvastatin (CRESTOR) 20 MG tablet Take 1 tablet (20 mg total) by mouth daily. 10/04/15   Harrison Mons, PA-C     Allergies  Allergen Reactions  .  Lipitor [Atorvastatin] Hives and Itching       Objective:  Physical Exam  Constitutional: He is oriented to person, place, and time. He appears well-developed and well-nourished. He is active and cooperative. No distress.  BP 140/100 mmHg  Pulse 80  Temp(Src) 98.9 F (37.2 C) (Oral)  Resp 16  Ht 5\' 9"  (1.753 m)  Wt 172 lb 12.8 oz (78.382 kg)  BMI 25.51 kg/m2  SpO2 97%  HENT:  Head: Normocephalic and atraumatic.  Right Ear: Hearing normal.  Left Ear: Hearing normal.  Eyes: Conjunctivae are normal. No scleral icterus.  Neck: Normal range of motion. Neck supple. No thyromegaly present.  Cardiovascular: Normal rate, regular rhythm and normal heart sounds.   Pulses:      Radial pulses are 2+ on the right side, and 2+ on the left side.  Pulmonary/Chest: Effort normal and breath sounds normal.  Lymphadenopathy:       Head (right side): No tonsillar, no preauricular, no posterior auricular and no occipital adenopathy present.       Head (left side): No tonsillar, no preauricular, no posterior auricular and no occipital adenopathy present.    He has no cervical adenopathy.       Right: No supraclavicular adenopathy present.       Left: No supraclavicular adenopathy present.  Neurological: He is alert and oriented to person, place, and time. No sensory deficit.  Skin: Skin is warm, dry and intact. No rash noted. No cyanosis or erythema. Nails show no clubbing.  Psychiatric: He has a normal mood and affect. His speech is normal and behavior is normal.           Assessment & Plan:   1. Diabetes mellitus due to underlying condition with diabetic nephropathy, without long-term current use of insulin ([place]) Await lab results. Will add Farxiga if A1C >8%. - Hemoglobin A1c  2. Accelerated hypertension Above goal today, but usually <140/90.  3. CKD stage 3 secondary to diabetes ([place]) Maintain HTN control.  4. Hyperlipidemia with target LDL less than 70 - rosuvastatin (CRESTOR) 20  MG tablet; Take 1 tablet (20 mg total) by mouth daily.  Dispense: 90 tablet; Refill: 3  5. Low testosterone He knows that uncontrolled HTN and diabetes can contribute to this issue, and we will need to continue to work on keeping them controlled. However, refer to urology for consideration of testosterone therapy. - Ambulatory referral to Urology   [name], PA-C Physician Assistant-Certified [facility]

## 2015-10-08 MED ORDER — DAPAGLIFLOZIN PROPANEDIOL 5 MG PO TABS: 5.0000 mg | ORAL_TABLET | Freq: Every day | ORAL | Status: DC

## 2015-10-13 ENCOUNTER — Other Ambulatory Visit: Payer: Self-pay | Admitting: Pulmonary Disease

## 2015-10-14 ENCOUNTER — Telehealth: Payer: Self-pay

## 2015-10-14 NOTE — Telephone Encounter (Signed)
PA needed for [place]. However, I will leave a savings card for pt to p/up and take to pharmacy. It should work even if insurance doesn't approve coverage since pt has commercial ins. Left card in drawer for pt p/up and LMOM for pt to explain he needs to p/up and take to pharm to bring his cost way down.

## 2015-10-15 ENCOUNTER — Encounter: Payer: Self-pay | Admitting: *Deleted

## 2015-11-14 ENCOUNTER — Ambulatory Visit: Payer: BLUE CROSS/BLUE SHIELD | Admitting: Pulmonary Disease

## 2015-11-23 ENCOUNTER — Telehealth: Payer: Self-pay

## 2015-11-23 NOTE — Telephone Encounter (Signed)
Holly from [facility] is calling because the patient has a referral and they need the testosterone labs faxed.  Appointment is 7/20 at [place].  Fax: [phone]

## 2015-11-23 NOTE — Telephone Encounter (Signed)
The patient did not have labwork for his testosterone levels performed.  I left a message for [facility] at [facility] with this information.

## 2015-12-07 ENCOUNTER — Other Ambulatory Visit: Payer: Self-pay

## 2015-12-07 DIAGNOSIS — N521 Erectile dysfunction due to diseases classified elsewhere: Principal | ICD-10-CM

## 2015-12-07 DIAGNOSIS — E1165 Type 2 diabetes mellitus with hyperglycemia: Principal | ICD-10-CM

## 2015-12-07 DIAGNOSIS — IMO0002 Reserved for concepts with insufficient information to code with codable children: Secondary | ICD-10-CM

## 2015-12-07 DIAGNOSIS — E1159 Type 2 diabetes mellitus with other circulatory complications: Secondary | ICD-10-CM

## 2015-12-07 MED ORDER — GLIPIZIDE 10 MG PO TABS: 10.0000 mg | ORAL_TABLET | Freq: Two times a day (BID) | ORAL | 1 refills | Status: DC

## 2015-12-11 ENCOUNTER — Encounter: Payer: Self-pay | Admitting: Physician Assistant

## 2015-12-11 DIAGNOSIS — R7989 Other specified abnormal findings of blood chemistry: Secondary | ICD-10-CM

## 2015-12-29 ENCOUNTER — Ambulatory Visit (INDEPENDENT_AMBULATORY_CARE_PROVIDER_SITE_OTHER)
Admission: RE | Admit: 2015-12-29 | Discharge: 2015-12-29 | Disposition: A | Discharge status: Home / Self Care | Payer: BLUE CROSS/BLUE SHIELD | Source: Ambulatory Visit | Attending: Pulmonary Disease | Admitting: Pulmonary Disease

## 2015-12-29 ENCOUNTER — Other Ambulatory Visit (INDEPENDENT_AMBULATORY_CARE_PROVIDER_SITE_OTHER): Payer: BLUE CROSS/BLUE SHIELD

## 2015-12-29 ENCOUNTER — Ambulatory Visit (INDEPENDENT_AMBULATORY_CARE_PROVIDER_SITE_OTHER): Payer: BLUE CROSS/BLUE SHIELD | Admitting: Pulmonary Disease

## 2015-12-29 ENCOUNTER — Encounter: Payer: Self-pay | Admitting: Pulmonary Disease

## 2015-12-29 VITALS — BP 138/82 | HR 71 | Temp 98.2°F | Ht 70.0 in | Wt 175.0 lb

## 2015-12-29 DIAGNOSIS — R9389 Abnormal findings on diagnostic imaging of other specified body structures: Secondary | ICD-10-CM

## 2015-12-29 DIAGNOSIS — E785 Hyperlipidemia, unspecified: Secondary | ICD-10-CM

## 2015-12-29 DIAGNOSIS — E1122 Type 2 diabetes mellitus with diabetic chronic kidney disease: Secondary | ICD-10-CM | POA: Diagnosis not present

## 2015-12-29 DIAGNOSIS — Q998 Other specified chromosome abnormalities: Secondary | ICD-10-CM

## 2015-12-29 DIAGNOSIS — I429 Cardiomyopathy, unspecified: Secondary | ICD-10-CM

## 2015-12-29 DIAGNOSIS — R938 Abnormal findings on diagnostic imaging of other specified body structures: Secondary | ICD-10-CM

## 2015-12-29 DIAGNOSIS — I5022 Chronic systolic (congestive) heart failure: Secondary | ICD-10-CM | POA: Diagnosis not present

## 2015-12-29 DIAGNOSIS — Q738 Other reduction defects of unspecified limb(s): Secondary | ICD-10-CM

## 2015-12-29 DIAGNOSIS — D649 Anemia, unspecified: Secondary | ICD-10-CM

## 2015-12-29 DIAGNOSIS — E0821 Diabetes mellitus due to underlying condition with diabetic nephropathy: Secondary | ICD-10-CM

## 2015-12-29 DIAGNOSIS — N183 Chronic kidney disease, stage 3 unspecified: Secondary | ICD-10-CM

## 2015-12-29 LAB — BASIC METABOLIC PANEL
BUN: 27 mg/dL — AB (ref 6–23)
CO2: 30 mEq/L (ref 19–32)
CREATININE: 1.78 mg/dL — AB (ref 0.40–1.50)
Calcium: 9.6 mg/dL (ref 8.4–10.5)
Chloride: 99 mEq/L (ref 96–112)
GFR: 51.75 mL/min — AB (ref >60.00)
GLUCOSE: 348 mg/dL — AB (ref 70–99)
POTASSIUM: 4.8 meq/L (ref 3.5–5.1)
Sodium: 133 mEq/L — ABNORMAL LOW (ref 135–145)

## 2015-12-29 LAB — HEMOGLOBIN A1C
HEMOGLOBIN A1C: 11 % — AB (ref <5.7)
MEAN PLASMA GLUCOSE: 269 mg/dL

## 2015-12-29 LAB — SEDIMENTATION RATE: SED RATE: 63 mm/h — AB (ref 0–20)

## 2015-12-29 MED ORDER — FLUTICASONE FUROATE-VILANTEROL 100-25 MCG/INH IN AEPB: 1.0000 | INHALATION_SPRAY | Freq: Every day | RESPIRATORY_TRACT | 11 refills | Status: DC

## 2015-12-29 MED ORDER — FLUTICASONE FUROATE-VILANTEROL 100-25 MCG/INH IN AEPB: 1.0000 | INHALATION_SPRAY | Freq: Every day | RESPIRATORY_TRACT | 0 refills | Status: AC

## 2015-12-29 NOTE — Progress Notes (Signed)
Subjective:     Patient ID: Brandon Carroll, male   DOB: 1961-04-24, 55 y.o.   MRN: [id number]  HPI  ~  June 08, 2015:  Initial pulmonary consult by SN>        20 y/o man referred by Harrison Mons PA, [facility] for a pulmonary evaluation to r/o Sarcoidosis;  He has a history of a cardiomyopathy, chronic systolic CHF w/ WU=98-11%, HBP, DM, HL, etc;  He was [facility] 12/19 - 05/05/15 by [facility] after presenting w/ dyspnea, PND & 2 pillow orthopnea;  CXR showed bilat effusions, some incr markings and prob LLL atx;  2DEcho showed a cardiomyopathy w/ EF=40-45%;  Labs showed elev BNP=971, mild renal insuffic w/ Cr=1.4-1.6, mild anemia w/ Hg=11-12 and small cells w/ MCV=76;  Pt was diuresed w/ Lasix & lost 7# of fluid w/ improvement in his breathing (also given empiric Levaquin rx) and he was disch for outpt follow up... CT Chest performed 05/04/15 showed scattered bilat pulm nodules (largest=44m in RUL) and mild adenopathy (R paratrach/ subcarinal/ aortopulm window/ & ?bilat hilar regions), norm heart size, coronary atherosclerosis noted, bilat effusions and basilar atx...        For his part the patient states that his SOB has completely resolved (w/ treatment of his CHF);  He notes min cough, small amt of clear sput, no hemoptysis, denies f/c/s, no CP;  He notes some DOE but able to perform his duties at [employer] & down stairs now...   Smoking Hx>  He is a never smoker...  Pulmonary Hx>  He has no prev hx of lung disease- no hx asthma, bronchitis, pneumonia, Tb or exposure.  Medical Hx>  As below-- HBP, CHF/cardiomyopathy (never seen by Cards), DM, HL, RI, anemia...  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  Current Meds>  AlbutHFA prn, Mucinex, Coreg, Calan, Lisinopril, Lasix, Mevacor, Glucotrol EXAM shows Afeb, VSS, O2sat=98% on RA;  Heent- neg, mallampati2;  Chest- clear w/o w/r/r;  Heart-  RR w/o m/r/g;  Abd- soft, nontender, neg;  Ext- neg w/o c/c/e;  Neuro- intact w/o focal deficits...  CXR 05/02/15 showed bilat effusions, some incr markings and prob LLL atx  EKG 05/02/15 showed NSR, rate73/min, +STTWA- consider inferolat ischemia...  2DEcho 05/03/15 showed mild LVH, mod reduced LVF w/ EF=40-45% & diffuse HK; MV & AoV are wnl, LA is mod dilated, RA is mildly dilated  Labs in Epic from 04/2015 showed elev BNP=971, mild renal insuffic w/ Cr=1.4-1.6, mild anemia w/ Hg=11-12 and small cells w/ MCV=76   CT Chest performed 05/04/15 showed scattered bilat pulm nodules (largest=6753min RUL) and mild adenopathy (R paratrach/ subcarinal/ aortopulm window/ & ?bilat hilar regions), norm heart size, coronary atherosclerosis noted, bilat effusions and basilar atx...    Spirometry 06/08/15 showed FVC=3.36 (81%), FEV1=2.62 (79%), %1sec=78, mid-flows are sl reduced at 61% predicted; This is c/w very mild airflow obstruction esp in small airways, can't r/o superimposed restriction w/o LV measurement.   Ambulatory Oximetry 06/08/15>  O2sat=100% on RA at rest w/ pulse=66/min; He ambulated 3 laps w/o difficulty, lowest O2sat=99% w/ pulse=74/min...  IMP >>    1) Abn CXR & CTChest w/ scattered bilat pulm nodules (largest=53m63mn RUL) and mild adenopathy seen (R paratrach/ subcarinal/ aortopulm window/ & ?bilat hilar regions)> the Adm CXR 04/2015 appeared to me to be c/w CHF w/ bilat effusions, interstitial edema, and some LLL atx (clinical  picture was not that of pneumonia w/o fever/ purulent sput, normal WBC ct, neg pneumococcal & legionella Ag, etc); radiology wondered about sarcoidosis, pt was on Lisinopril at time of Adm & therefore no ACE level checked => we switched Lisinopril to LOSARTAN100 today...     2) HBP> controlled on Coreg 12.5- 1/2Bid, Calan120Bid, Lisinopril40, Lasix20-2Bid => we switched the ACE to ARB as above...    3) Cardiomyopathy w/ chronic systolic CHF and PP=50-93% by 2DEcho> BNP in  hosp was 971 & he diuresed 7+lbs of fluid w/ improvement in his breathing; he has not seen Cards & they didn't call consult in [place]; CTChest alluded to calcif in coronaries and he needs Cards consult for Cath and on-going treatment for his CHF.Marland KitchenMarland Kitchen     4) Hyperlipidemia> on Mevacor20 per PCP.Marland KitchenMarland Kitchen    5) Diabetes Mellitus> he has seen DrEllison in the past, A1c=7.0 04/2015 Hosp, urinalysis was pos for proteinuria; Prev on LANTUS & Metformin, now just on Glucotrol?    6) Renal insuffic> baseline Cr appears to be ~1.2-1.3, Cr in [place] was 1.4-1.6 w/ diuresis, then back to 1.3    7) Elev Alk Phos> this fractionated to liver but GOT/GPT are wnl    8) Anemia w/ small cells> Hg in Hosp was 11.9 w/ MCV=76; baseline in Epic ~12-13 w/o Iron studies or anemia eval previously done...  PLAN >>     Brandon Carroll appears to have a major heart problem and needs to see Cardiology 1st- we will refer ASAP so they can manage his cardiomyopathy & eval for poss underlying CAD;  From the pulmonary perspective he is a never smoker & has no prior hx of any lung problem;  He has an abn CXR & CT Chest as noted, only mild PFT abnormality & I am not yet ready to commit him to a lung Bx or mediastinoscopy & bx to cement the dx of sarcoidosis;  For now we will switch his Lisinopril to Losartan (so we can check ACE level later) and we will plan ROV in 2-38mow/ CXR at that time (he will need f/u CT at a later date).   ~  August 11, 2015:  224moOV & Brandon Carroll saw CARDS, DrRandolph & her notes are reviewed> Hypertensive heart dis, chr sys & diastolic CHF, mult coronary risk factors;  They adjusted his BP meds & did a heart CATH 06/15/15 showing trivial CAD w/ 10% LAD & RCA, and elev LV filling pressures;  DOE w/ min exertion persisted and his Lasix was adjusted w/ improvement;  Off the ACE inhibitor his ACE level was checked and ret at 41 (8-52) confirming outr opinion that he does not have active sarcoidosis;  He was placed on Breo daily & Albut HFA  for prn use- ?any benefit... We reviewed the following medical problems during today's office visit >>     Abn CXR & CTChest w/ scattered bilat pulm nodules (largest=73m63mn RUL) and mild adenopathy seen (R paratrach/ subcarinal/ aortopulm window/ & ?bilat hilar regions)> the Adm CXR appeared to me to be c/w CHF w/ bilat effusions, interstitial edema, and some LLL atx (clinical picture was not that of pneumonia w/o fever/ purulent sput, normal WBC ct, neg pneumococcal & legionella Ag, etc); radiology wondered about sarcoidosis, pt was on Lisinopril at time of Adm & therefore no ACE level checked => we switched Lisinopril to [id number] & checked ACE level 07/05/15=> 41 (norm8-52) confirming our clinical impression of no active sarcoid... He wqas placed on BREO &  ALbutHFA rescue inhaler in the interim as a trial- no real change noted (Spirometry 05/2015 was OK x mild small airways dis).    HBP> better controlled on Coreg 25Bid, Amlod5, Losar100, Lasix40/d;  BP= 146/94 and he says breathing is improved, feeling better, no CP/ palpit/ edema...    Cardiomyopathy w/ chronic systolic CHF and NT=61-44% by 2DEcho> BNP in hosp was 971 & he diuresed 7+lbs of fluid w/ improvement in his breathing; CTChest alluded to calcif in coronaries and he was seen by Ivar Drape- referred for CATH 06/10/15 showing triv CAD (10% LAD & RCA) and elev LV filling pressures c/w a non-ischemic cardiomyopathy;  He is improved w/ better BP control & CHF rx per Cards...    Hyperlipidemia> prev on Mevacor20 w/ FLP 07/26/15 showing TChol 254, TG 109, HDL 74, LDL 158; DrRandolph changed Mevacor20 to [place]...    Diabetes Mellitus> he has seen DrEllison in the past, A1c=7.0 04/2015 Hosp, urinalysis was pos for proteinuria; Prev on LANTUS & Metformin, now just on Glucotrol10Bid, BS=150-160 w/ A1c=7.4 (06/21/15)    Renal insuffic> baseline Cr appears to be ~1.2-1.3, Cr in [place] was 1.4-1.6 w/ diuresis ^ improvement in CHF...    Elev Alk Phos> this  fractionated to liver but GOT/GPT are wnl; PCP did Abd Sonar 07/08/15 showing 3.6 cm GB polyp, no stones, ducts ok & liver ok...    Anemia w/ small cells> Hg in Hosp was 11.9 w/ MCV=76; baseline in Epic ~12-13; repeat labs here 08/11/15=> Hb=13.1, MCV=77, Fe=89 (30%sat), Ferritin=60, B12=489, Folate=5.0 & rec OTC MVI + Folic Acid...    Rash>  He saw [facility] who felt rash was cutaneous fungal infection, not sarcoid, KOH was pos & rash resolved w/ Fluconazole & topical cream... EXAM shows Afeb, VSS (BP=146/94), O2sat=99% on RA;  Heent- neg, mallampati2;  Chest- clear w/o w/r/r;  Heart- RR w/o m/r/g;  Abd- soft, nontender, neg;  Ext- neg w/o c/c/e;  Neuro- intact w/o focal deficits...  LABS 08/11/15>  Chems- BS=318, Cr=1.62;  Hb=13.1, MCV=77, Fe=89 (30%sat), Ferritin=60, B12=489, Folate=5.0 & rec OTC MVI + Folic Acid... IMP/PLAN >>  His breathing is much improved on Lasix diuresis & adjustment in BP/ CHF meds- doubt much benefit from [facility];  F/u labs today show BS=318 on his Glucotrol alone- needs f/u DrEllison for Endocrine;  Anemia labs show Hg back to norm at 13 but cells still small & HAVE ALWAYS BEEN SMALL suggesting prob thallassemia minor, but Folic level sl low so we will rec MVI + OTC Folate... Plan is to see him back in June- time for CXR & f/u CT Chest to check the nodules seen 04/2015...   ~  December 29, 2015:  40moROV & JKeenis doing reasonably well from the pulm standpoint- notes sl dry cough, OTC Delsym helps, no SOB/ CP/ palpit/ sput/ hemoptysis/ f/c/s etc;  He is exercising at a gym on the treadmill he says;; Meds costs are a real issue for him- he ran out of Breo & couldn't afford refill;  [facility]2015 [facility] >300 w/ A1c=11.3 & they added FWilder Glade(which he could not afford to refill)... We reviewed the following medical problems during today's office visit >>     Abn CXR & CTChest, Elev ACE level=> c/w SARCOIDOSIS>   ~  04/2015 Hosp>  CTChest  04/2015 showed scattered bilat pulm nodules (largest=668min RUL) and mild adenopathy (R paratrach/ subcarinal/ aortopulm window/ & ?bilat hilar regions); CXR  was c/w CHF w/ bilat effusions, interstitial edema, and some LLL atx (clinical picture was not that of pneumonia w/o fever/ purulent sput, normal WBC ct, neg pneumococcal & legionella Ag, etc); radiology wondered about sarcoidosis, pt was on Lisinopril at that time & therefore no ACE level checked =>  ~  06/08/15 consult>  we switched Lisinopril to [id number] & checked ACE level 07/05/15=> 41 (norm8-52) suggesting no active sarcoid...  ~  07/2015>  He was placed on BREO & ALbutHFA rescue inhaler in the interim as a trial- no real change noted & too $$ (Spirometry 05/2015 was OK x mild small airways dis)...  ~  12/29/15>  Due for f/u CT Chest (+bilat patchy GGO & nodularity w/ adenopathy c/w sarcoid), and ACE level (elev >100 at this time)=> all c/w active sarcoid at this time, consider options for treatment w/ or w/o lung bx...    HBP> controlled on Coreg 25Bid, Amlod5Bid, Losartan100, Lasix40/d;  BP= 136/82 and he says breathing is at baseline, feeling well- no CP/ palpit/ edema...    Nonischemic Cardiomyopathy w/ chronic systolic CHF and ZO=10-96% by 2DEcho> BNP in hosp 04/2015 was 971 & he diuresed 7+lbs of fluid w/ improvement in his breathing; CTChest alluded to calcif in coronaries and he was seen by Ivar Drape 05/2015- CATH showing triv CAD (10% LAD & RCA) and elev LV filling pressures c/w a non-ischemic cardiomyopathy;  He is improved w/ better BP control & CHF rx per Cards...    Hyperlipidemia> prev on Mevacor20 w/ FLP 07/26/15 showing TChol 254, TG 109, HDL 74, LDL 158; DrRandolph changed Mevacor20 to [place] & now on Crestor20...    Diabetes Mellitus> seen DrEllison 11/2014 & placed on Lantus/ Humalog; pt couldn't afford & never followed up, 04/2015 Hosp- A1c=7.0 & disch on Glucotrol 10Bid w/ BS=150-160 w/ A1c=7.4 (06/21/15); but since then BS>300 &  A1c>11.0 and he desperately needs Endocrine management of DM w/ need for Prednisone Rx of his Sarcoid!    Renal insuffic> baseline Cr appears to be ~1.2-1.3, Cr in [place] was 1.4-1.6 w/ diuresis & improvement in CHF; most recent Cr= 1.6-1.8 range...    Elev Alk Phos> this fractionated to liver but GOT/GPT are wnl; PCP did Abd Sonar 07/08/15 showing 3.6 cm GB polyp, no stones, ducts ok & liver ok...    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    Rash>  He saw [facility] who felt rash was cutaneous fungal infection, not sarcoid, KOH was pos & rash resolved w/ Fluconazole & topical cream... EXAM shows Afeb, VSS, O2sat=97% on RA;  Heent- neg, mallampati2;  Chest- clear w/o w/r/r;  Heart- RR w/o m/r/g;  Abd- soft, nontender, neg;  Ext- neg w/o c/c/e;  Neuro- intact w/o focal deficits...  CXR 12/29/15>  Norm heart size & vascularity, clear lungs, no effusions, sl fullness in hilum, NAD...  LABS 12/29/15>  Chems- BS=348, A1c=11.0, BUN=27. Cr=1.78;  Sed=63;  ACE>100...  CT Chest done 01/03/16>  Norm heart size, Ao & coronary atherosclerosis, norm caliber PAs, mildly enlarged mediastinal & hilar nodes, scat bilat mid & upper lobe nodularity w/ dominant 56m RUL nodule unchanged, mild patchy GGO in bilat upper lobes w/ dominant LUL 1.2cm nodule which is new => all felt to be c/w sarcoid. IMP/PLAN>>  Brandon Carroll has a pattern of pulm abnormalities that are concordant w/ the diagnosis  of active sarcoidosis at this time; we do not have a biopsy for confirmation but he would like to avoid lung bx if poss & I indicated that I would be willing to treat him for this condition & follow his response very closely so long as he was also under the care of a diabetic specialist at the same time;  His DM is poorly controlled 7 he will need active  management of this condition w/ meds he can afford to fill (ie- prob NPH & Reg inulin or mixed 70/30 or similar) while we place him on Pred & taper it as rapidly as possible... I would like him to see the diabetologist before we start the Pred rx...     Past Medical History:  Diagnosis Date  . Acute systolic CHF (congestive heart failure), NYHA class 2 ([place]) 05/04/2015  . Acute-on-chronic kidney injury ([place]) 05/04/2015  . Diabetes mellitus   . Erectile dysfunction   . Hyperlipidemia   . Hypertension     Past Surgical History:  Procedure Laterality Date  . CARDIAC CATHETERIZATION N/A 06/15/2015   Procedure: Left Heart Cath and Coronary Angiography;  Surgeon: Peter M Martinique, MD;  Location: [facility];  Service: Cardiovascular;  Laterality: N/A;  . HERNIA REPAIR  7th grade   umbilical    Outpatient Encounter Prescriptions as of 12/29/2015  Medication Sig  . Albuterol Sulfate (PROAIR RESPICLICK) 401 (90 BASE) MCG/ACT AEPB Inhale 2 puffs into the lungs every 6 (six) hours as needed.  Marland Kitchen amLODipine (NORVASC) 5 MG tablet Take 1 tablet (5 mg total) by mouth 2 (two) times daily.  Marland Kitchen aspirin EC 81 MG tablet Take 81 mg by mouth daily.  . carvedilol (COREG) 25 MG tablet Take 1 tablet (25 mg total) by mouth 2 (two) times daily.  . furosemide (LASIX) 20 MG tablet TAKE 2 TABLETS (40 MG TOTAL) BY MOUTH EVERY MORNING.  Marland Kitchen glipiZIDE (GLUCOTROL) 10 MG tablet Take 1 tablet (10 mg total) by mouth 2 (two) times daily before a meal.  . losartan (COZAAR) 100 MG tablet TAKE 1 TABLET (100 MG TOTAL) BY MOUTH DAILY.  . rosuvastatin (CRESTOR) 20 MG tablet Take 1 tablet (20 mg total) by mouth daily.  Marland Kitchen Spacer/Aero-Holding Chambers (AEROCHAMBER MV) inhaler Use as instructed  . dapagliflozin propanediol (FARXIGA) 5 MG TABS tablet Take 5 mg by mouth daily. (Patient not taking: Reported on 12/29/2015)  . fluticasone furoate-vilanterol (BREO ELLIPTA) 100-25 MCG/INH AEPB Inhale 1 puff into the lungs daily.  .  fluticasone furoate-vilanterol (BREO ELLIPTA) 100-25 MCG/INH AEPB Inhale 1 puff into the lungs daily.  . [DISCONTINUED] fluticasone furoate-vilanterol (BREO ELLIPTA) 100-25 MCG/INH AEPB Inhale 1 puff into the lungs daily. (Patient not taking: Reported on 10/04/2015)   No facility-administered encounter medications on file as of 12/29/2015.     Allergies  Allergen Reactions  . Lipitor [Atorvastatin] Hives and Itching    Immunization History  Administered Date(s) Administered  . Influenza, Seasonal, Injecte, Preservative Fre 07/22/2012  . Influenza,inj,Quad PF,36+ Mos 08/04/2014  . Pneumococcal Polysaccharide-23 07/22/2012, 05/03/2015  . Tdap 05/14/2010    Current Medications, Allergies, Past Medical History, Past Surgical History, Family History, and Social History were reviewed in [employer] record.   Review of Systems             All symptoms NEG except where BOLDED >>  Constitutional:  F/C/S, fatigue, anorexia, unexpected weight change. HEENT:  HA, visual changes, hearing loss, earache, nasal symptoms, sore throat, mouth sores, hoarseness. Resp:  cough, sputum, hemoptysis; SOB, tightness, wheezing. Cardio:  CP, palpit, DOE, orthopnea, edema. GI:  N/V/D/C, blood in stool; reflux, abd pain, distention, gas. GU:  dysuria, freq, urgency, hematuria, flank pain, voiding difficulty. MS:  joint pain, swelling, tenderness, decr ROM; neck pain, back pain, etc. Neuro:  HA, tremors, seizures, dizziness, syncope, weakness, numbness, gait abn. Skin:  suspicious lesions or skin rash. Heme:  adenopathy, bruising, bleeding. Psyche:  confusion, agitation, sleep disturbance, hallucinations, anxiety, depression suicidal.   Objective:   Physical Exam       Vital Signs:  Reviewed...   General:  WD, WN, 55 y/o BM in NAD; alert & oriented; pleasant & cooperative... HEENT:  [place]/AT; Conjunctiva- pink, Sclera- nonicteric, EOM-wnl, PERRLA, Fundi-benign; EACs-clear, TMs-wnl;  NOSE-clear; THROAT-clear & wnl.  Neck:  Supple w/ full ROM; no JVD; normal carotid impulses w/o bruits; no thyromegaly or nodules palpated; no lymphadenopathy.  Chest:  Clear to P & A; without wheezes, rales, or rhonchi heard. Heart:  Regular Rhythm; norm S1 & S2 without murmurs, rubs, or gallops detected. Abdomen:  Soft & nontender- no guarding or rebound; normal bowel sounds; no organomegaly or masses palpated. Ext:  Normal ROM; without deformities or arthritic changes; no varicose veins, venous insuffic, or edema;  Pulses intact w/o bruits. Neuro:  CNs II-XII intact; motor testing normal; sensory testing normal; gait normal & balance OK. Derm:  No lesions noted; no rash etc. Lymph:  No cervical, supraclavicular, axillary, or inguinal adenopathy palpated.   Assessment:      IMP >>    Abn CXR & CTChest, Elev ACE level=> c/w SARCOIDOSIS>  He would like to avoid lung bx if poss & I indicated that I would be willing to treat him w/o based on the strength of the current evidence (CT results + ACE level), but this will require him to under active DM management by an Endocrinologist before Prednisone is started...    HBP> controlled on Rx, continue same...    Nonischemic Cardiomyopathy w/ chronic systolic CHF and HW=29-93% by 2DEcho> managed by Cards, DrRandolph- continue her meds...    Hyperlipidemia> he lists Cres20 currently, he will need f/u FLP by his PCP later...    Diabetes Mellitus> poorly controlled & we reviewed DM diet & need for active Diabetologist involvement in his care, esp in light of need for Pred rx for the sarcoid...     Elev Alk Phos> this fractionated to liver but GOT/GPT are wnl; PCP did Abd Sonar 07/08/15 showing 3.6 cm GB polyp, no stones, ducts ok & liver ok...    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    Rash>  He saw Derm w/ +KOH & improved w/ their rx...  PLAN >>  06/08/15>  Brandon Carroll appears to have a major heart problem and needs to see  Cardiology 1st- we will refer ASAP so they can manage his cardiomyopathy & eval for poss underlying CAD;  From the pulmonary perspective he is a never smoker & has no prior hx of any lung problem;  He has an abn CXR & CT Chest as noted, only mild PFT abnormality & I am not yet ready to commit him to a lung Bx or mediastinoscopy & bx to cement the dx of sarcoidosis;  For now we will switch his Lisinopril to Losartan (so we can check ACE level later) and we will plan ROV in 2-10mow/ CXR at that time (he will need f/u CT at a later date) 08/11/15>  His breathing is much  improved on Lasix diuresis & adjustment in BP/ CHF meds- doubt much benefit from [facility];  F/u labs today show BS=318 on his Glucotrol alone- needs f/u DrEllison for Endocrine;  Anemia labs show Hg back to norm at 13 but cells still small & HAVE ALWAYS BEEN SMALL suggesting prob thallassemia minor, but Folic level sl low so we will rec MVI + OTC Folate... Plan is to see him back in June- time for CXR & f/u CT Chest to check the nodules seen 04/2015...  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 I would like him to see the diabetologist before we start the Pred rx...      Plan:     Patient's Medications  New Prescriptions   FLUTICASONE FUROATE-VILANTEROL (BREO ELLIPTA) 100-25 MCG/INH AEPB    Inhale 1 puff into the lungs daily.  Previous Medications   ALBUTEROL SULFATE (PROAIR  RESPICLICK) 130 (90 BASE) MCG/ACT AEPB    Inhale 2 puffs into the lungs every 6 (six) hours as needed.   AMLODIPINE (NORVASC) 5 MG TABLET    Take 1 tablet (5 mg total) by mouth 2 (two) times daily.   ASPIRIN EC 81 MG TABLET    Take 81 mg by mouth daily.   CARVEDILOL (COREG) 25 MG TABLET    Take 1 tablet (25 mg total) by mouth 2 (two) times daily.   DAPAGLIFLOZIN PROPANEDIOL (FARXIGA) 5 MG TABS TABLET    Take 5 mg by mouth daily.   FUROSEMIDE (LASIX) 20 MG TABLET    TAKE 2 TABLETS (40 MG TOTAL) BY MOUTH EVERY MORNING.   GLIPIZIDE (GLUCOTROL) 10 MG TABLET    Take 1 tablet (10 mg total) by mouth 2 (two) times daily before a meal.   LOSARTAN (COZAAR) 100 MG TABLET    TAKE 1 TABLET (100 MG TOTAL) BY MOUTH DAILY.   ROSUVASTATIN (CRESTOR) 20 MG TABLET    Take 1 tablet (20 mg total) by mouth daily.   SPACER/AERO-HOLDING CHAMBERS (AEROCHAMBER MV) INHALER    Use as instructed  Modified Medications   Modified Medication Previous Medication   FLUTICASONE FUROATE-VILANTEROL (BREO ELLIPTA) 100-25 MCG/INH AEPB fluticasone furoate-vilanterol (BREO ELLIPTA) 100-25 MCG/INH AEPB      Inhale 1 puff into the lungs daily.    Inhale 1 puff into the lungs daily.  Discontinued Medications   No medications on file

## 2015-12-29 NOTE — Patient Instructions (Signed)
Today we updated your med list in our EPIC system...    Continue your current medications the same...  We refilled your BREO one puff daily... OK to use the DELSYM-- 2 tsp twice daily as needed for cough...  Today we checked a follow up CXR & blood work... We will sched a follow up CT scan of your chest to compare the the 04/2015 scan...    We will contact you w/ the results when available...   Keep up the great work w/ your exercise program...  We discussed referral to a DIABETIC specialist to help you w/ control of your sugar...    We will arrange for this consultation...  Call for any questions...  Let's plan a follow up visit in 81mo, sooner if needed for problems.Marland KitchenMarland Kitchen

## 2015-12-30 LAB — ANGIOTENSIN CONVERTING ENZYME

## 2016-01-03 ENCOUNTER — Ambulatory Visit (INDEPENDENT_AMBULATORY_CARE_PROVIDER_SITE_OTHER)
Admission: RE | Admit: 2016-01-03 | Discharge: 2016-01-03 | Disposition: A | Discharge status: Home / Self Care | Payer: BLUE CROSS/BLUE SHIELD | Source: Ambulatory Visit | Attending: Pulmonary Disease | Admitting: Pulmonary Disease

## 2016-01-03 DIAGNOSIS — R938 Abnormal findings on diagnostic imaging of other specified body structures: Secondary | ICD-10-CM | POA: Diagnosis not present

## 2016-01-03 DIAGNOSIS — R9389 Abnormal findings on diagnostic imaging of other specified body structures: Secondary | ICD-10-CM

## 2016-01-11 ENCOUNTER — Telehealth: Payer: Self-pay

## 2016-01-11 NOTE — Telephone Encounter (Signed)
Chelle, I received fax advising that Wilder Glade needs a PA. Pt was able to get it when you first Rxd it by using the savings card, but the savings card payment has changed and it now needs to be run through insurance first (and approved for coverage) to use the card. Looked up pt's formulary and Invokana seems to be the preferred med in the class. Do you want to change Rx? Pt was Rxd Invokana in the past 11/04/14, and then Dr Loanne Drilling stopped it at 12/01/14 OV.

## 2016-01-11 NOTE — Telephone Encounter (Signed)
Yes, let's try Invokana again. 100 mg QD. #90, RF x 3

## 2016-01-12 ENCOUNTER — Other Ambulatory Visit: Payer: Self-pay | Admitting: Pulmonary Disease

## 2016-01-12 MED ORDER — CANAGLIFLOZIN 100 MG PO TABS: 100.0000 mg | ORAL_TABLET | Freq: Every day | ORAL | 3 refills | Status: DC

## 2016-01-12 NOTE — Telephone Encounter (Signed)
Sent in new Rx and also faxed back orig PA request to let them know we are sending Invokana as replacement and to make sure pt understands change when he picks up.

## 2016-01-18 ENCOUNTER — Telehealth: Payer: Self-pay | Admitting: Pulmonary Disease

## 2016-01-18 DIAGNOSIS — E0821 Diabetes mellitus due to underlying condition with diabetic nephropathy: Secondary | ICD-10-CM

## 2016-01-18 NOTE — Telephone Encounter (Signed)
Pt states he is calling SN back-states SN left a message for him to call back regarding results.    Please advise. Thanks.

## 2016-01-18 NOTE — Telephone Encounter (Signed)
I called pt at [phone] he tells me he was on a 2wk vacation to [place] return my numerous calls...  We reviewed his CT Chest & Labs from 12/2015>    CT Chest w/ mediastinal & hilar adenopathy, stable mild nodularity, and radiology favors sarcoid...    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    Fortunately his prev PFTs showed just mild pulm restriction & superimposed small airways dis...    Unfortunately his DM is way out of control w/ BS=348 & A1c=11.0; in addition he has renal insuffic w/ Cr=1.78  I reviewed all this w/ the pt in detail & I told him that he 1st needs to get his DM under control before we can even enterain the poss of starting him on Pred rx for presumed sarcoid (he does not want a biopsy) => refer back to DrEllison ASAP where he will need NPH & Regular insulin due to his inability to afford the other options.Marland KitchenMarland Kitchen

## 2016-01-19 NOTE — Addendum Note (Signed)
Addended by: Elie Confer on: 01/19/2016 09:06 AM   Modules accepted: Orders

## 2016-02-08 ENCOUNTER — Encounter: Payer: Self-pay | Admitting: Endocrinology

## 2016-02-08 ENCOUNTER — Ambulatory Visit (INDEPENDENT_AMBULATORY_CARE_PROVIDER_SITE_OTHER): Payer: BLUE CROSS/BLUE SHIELD | Admitting: Endocrinology

## 2016-02-08 VITALS — BP 148/94 | HR 72 | Ht 71.0 in | Wt 170.0 lb

## 2016-02-08 DIAGNOSIS — N183 Chronic kidney disease, stage 3 unspecified: Secondary | ICD-10-CM

## 2016-02-08 DIAGNOSIS — Z794 Long term (current) use of insulin: Secondary | ICD-10-CM

## 2016-02-08 DIAGNOSIS — E0822 Diabetes mellitus due to underlying condition with diabetic chronic kidney disease: Secondary | ICD-10-CM | POA: Diagnosis not present

## 2016-02-08 MED ORDER — INSULIN GLARGINE 100 UNIT/ML SOLOSTAR PEN: 10.0000 [IU] | PEN_INJECTOR | Freq: Every day | SUBCUTANEOUS | 99 refills | Status: DC

## 2016-02-08 MED ORDER — GLUCOSE BLOOD VI STRP: 1.0000 | ORAL_STRIP | Freq: Two times a day (BID) | 12 refills | Status: DC | PRN

## 2016-02-08 NOTE — Progress Notes (Signed)
Subjective:    Patient ID: Brandon Carroll, male    DOB: [birth year], 55 y.o.   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  no cbg record, but states cbg's are in the 300's.  He has fatigue.   Past Medical History:  Diagnosis Date  . Acute systolic CHF (congestive heart failure), NYHA class 2 ([place]) 05/04/2015  . Acute-on-chronic kidney injury ([place]) 05/04/2015  . Diabetes mellitus   . Erectile dysfunction   . Hyperlipidemia   . Hypertension     Past Surgical History:  Procedure Laterality Date  . CARDIAC CATHETERIZATION N/A 06/15/2015   Procedure: Left Heart Cath and Coronary Angiography;  Surgeon: Peter M Martinique, MD;  Location: [facility];  Service: Cardiovascular;  Laterality: N/A;  . HERNIA REPAIR  7th grade   umbilical    Social History   Social History  . Marital status: Divorced    Spouse name: n/a  . Number of children: 1  . Years of education: 12+   Occupational History  . Security [employer]   Social History Main Topics  . Smoking status: Never Smoker  . Smokeless tobacco: Never Used  . Alcohol use No  . Drug use: No  . Sexual activity: Not Currently     Comment: Erectile dysfunciotn   Other Topics Concern  . Not on file   Social History Narrative   Divorced. Education: [employer].    Unable to exercise due to significant SOB and DOE.    Lives alone.   Son lives in [place].    Current Outpatient Prescriptions on File Prior to Visit  Medication Sig Dispense Refill  . Albuterol Sulfate (PROAIR RESPICLICK) 509 (90 BASE) MCG/ACT AEPB Inhale 2 puffs into the lungs every 6 (six) hours as needed. 1 each 3  . amLODipine  (NORVASC) 5 MG tablet Take 1 tablet (5 mg total) by mouth 2 (two) times daily. 180 tablet 3  . aspirin EC 81 MG tablet Take 81 mg by mouth daily.    . carvedilol (COREG) 25 MG tablet Take 1 tablet (25 mg total) by mouth 2 (two) times daily. 60 tablet 11  . fluticasone furoate-vilanterol (BREO ELLIPTA) 100-25 MCG/INH AEPB Inhale 1 puff into the lungs daily. 28 each 11  . furosemide (LASIX) 20 MG tablet TAKE 2 TABLETS (40 MG TOTAL) BY MOUTH EVERY MORNING. 180 tablet 0  . glipiZIDE (GLUCOTROL) 10 MG tablet Take 1 tablet (10 mg total) by mouth 2 (two) times daily before a meal. 60 tablet 1  . losartan (COZAAR) 100 MG tablet TAKE 1 TABLET (100 MG TOTAL) BY MOUTH DAILY. 30 tablet 2  . rosuvastatin (CRESTOR) 20 MG tablet Take 1 tablet (20 mg total) by mouth daily. 90 tablet 3  . Spacer/Aero-Holding Chambers (AEROCHAMBER MV) inhaler Use as instructed (Patient not taking: Reported on 02/08/2016) 1 each 0   No current facility-administered medications on file prior to visit.     Allergies  Allergen Reactions  . Lipitor [Atorvastatin] Hives and Itching    Family History  Problem Relation Age of Onset  . Arthritis Mother   . COPD Mother   . Diabetes Mother     was thin, took insulin  .  Hypertension Mother   . Hypertension Father   . Arthritis Sister     rheumatoid  . COPD Sister   . Diabetes Brother   . Colon cancer Neg Hx     BP (!) 148/94   Pulse 72   Ht 5\' 11"  (1.803 m)   Wt 170 lb (77.1 kg)   SpO2 97%   BMI 23.71 kg/m    Review of Systems No sob/n/v/weight change.     Objective:   Physical Exam VITAL SIGNS:  See vs page GENERAL: no distress Pulses: dorsalis pedis intact bilat.   MSK: no deformity of the feet CV: no leg edema Skin:  no ulcer on the feet.  normal color and temp on the feet. Neuro: sensation is intact to touch on the feet Ext: There is bilateral onychomycosis of the toenails.    Lab Results  Component Value Date   HGBA1C 11.0 (H) 12/29/2015   Lab  Results  Component Value Date   CREATININE 1.78 (H) 12/29/2015   BUN 27 (H) 12/29/2015   NA 133 (L) 12/29/2015   K 4.8 12/29/2015   CL 99 12/29/2015   CO2 30 12/29/2015  i personally reviewed electrocardiogram tracing (06/10/15): Indication: CHF Impression: NS ST and TW-A    Assessment & Plan:  Insulin-requiring type 2: worse. Noncompliance with cbg recording, insulin, and f/u ov's: in this context, he needs qd insulin, and reasonable expectations.

## 2016-02-08 NOTE — Patient Instructions (Addendum)
check your blood sugar 4 times a day.  vary the time of day when you check, between before the 3 meals, and at bedtime.  also check if you have symptoms of your blood sugar being too high or too low.  please keep a record of the readings and bring it to your next appointment here.  You can write it on any piece of paper.  please call us sooner if your blood sugar goes below 70, or if you have a lot of readings over 200.  I have sent a prescription to your pharmacy, to resume the lantus.  Here is a discount card.   Please continue the same glipizide for now, but we'll stop this soon.  Here are 2 identical meters.  I have sent a prescription to your pharmacy, for strips. Please come back for a follow-up appointment in 2 weeks.

## 2016-02-10 ENCOUNTER — Other Ambulatory Visit: Payer: Self-pay | Admitting: Physician Assistant

## 2016-02-11 NOTE — Telephone Encounter (Signed)
PT NEEDS OV FOR HTN FOLLOW UP IN NEXT 30 DAYS

## 2016-02-22 ENCOUNTER — Ambulatory Visit: Payer: BLUE CROSS/BLUE SHIELD | Admitting: Endocrinology

## 2016-02-22 DIAGNOSIS — Z0289 Encounter for other administrative examinations: Secondary | ICD-10-CM

## 2016-02-23 ENCOUNTER — Encounter: Payer: Self-pay | Admitting: Physician Assistant

## 2016-02-23 DIAGNOSIS — N5201 Erectile dysfunction due to arterial insufficiency: Secondary | ICD-10-CM

## 2016-04-06 ENCOUNTER — Other Ambulatory Visit: Payer: Self-pay | Admitting: Physician Assistant

## 2016-04-07 NOTE — Telephone Encounter (Signed)
Brandon Carroll saw here 01/2016 12/2015 last labs

## 2016-04-08 NOTE — Telephone Encounter (Signed)
Please refill prn 

## 2016-04-08 NOTE — Telephone Encounter (Signed)
Meds ordered this encounter  Medications  . furosemide (LASIX) 20 MG tablet    Sig: TAKE 2 TABLETS (40 MG TOTAL) BY MOUTH EVERY MORNING.    Dispense:  90 tablet    Refill:  0

## 2016-04-09 MED ORDER — FUROSEMIDE 20 MG PO TABS: ORAL_TABLET | ORAL | 2 refills | Status: DC

## 2016-04-09 NOTE — Telephone Encounter (Signed)
Refill submitted. 

## 2016-04-20 ENCOUNTER — Other Ambulatory Visit: Payer: Self-pay | Admitting: Pulmonary Disease

## 2016-05-26 ENCOUNTER — Other Ambulatory Visit: Payer: Self-pay | Admitting: Physician Assistant

## 2016-05-26 DIAGNOSIS — N521 Erectile dysfunction due to diseases classified elsewhere: Principal | ICD-10-CM

## 2016-05-26 DIAGNOSIS — E1165 Type 2 diabetes mellitus with hyperglycemia: Principal | ICD-10-CM

## 2016-05-26 DIAGNOSIS — E1159 Type 2 diabetes mellitus with other circulatory complications: Secondary | ICD-10-CM

## 2016-05-26 DIAGNOSIS — IMO0002 Reserved for concepts with insufficient information to code with codable children: Secondary | ICD-10-CM

## 2016-06-18 ENCOUNTER — Ambulatory Visit (INDEPENDENT_AMBULATORY_CARE_PROVIDER_SITE_OTHER): Payer: Self-pay | Admitting: Endocrinology

## 2016-06-18 ENCOUNTER — Encounter: Payer: Self-pay | Admitting: Endocrinology

## 2016-06-18 VITALS — BP 134/94 | HR 82 | Ht 71.0 in | Wt 181.0 lb

## 2016-06-18 DIAGNOSIS — N183 Chronic kidney disease, stage 3 unspecified: Secondary | ICD-10-CM

## 2016-06-18 DIAGNOSIS — Z794 Long term (current) use of insulin: Secondary | ICD-10-CM

## 2016-06-18 DIAGNOSIS — E0822 Diabetes mellitus due to underlying condition with diabetic chronic kidney disease: Secondary | ICD-10-CM

## 2016-06-18 LAB — POCT GLYCOSYLATED HEMOGLOBIN (HGB A1C): Hemoglobin A1C: 9.6

## 2016-06-18 MED ORDER — INSULIN GLARGINE 100 UNIT/ML SOLOSTAR PEN: 25.0000 [IU] | PEN_INJECTOR | Freq: Every day | SUBCUTANEOUS | 99 refills | Status: DC

## 2016-06-18 NOTE — Progress Notes (Signed)
Subjective:    Patient ID: Brandon Carroll, male    DOB: Sep 23, 1960, 56 y.o.   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   Interval history: He says he never misses the insulin.  no cbg record, but states cbg's are persistently in the 200's.  pt states he feels well in general. Past Medical History:  Diagnosis Date  . Acute systolic CHF (congestive heart failure), NYHA class 2 ([place]) 05/04/2015  . Acute-on-chronic kidney injury ([place]) 05/04/2015  . Diabetes mellitus   . Erectile dysfunction   . Hyperlipidemia   . Hypertension     Past Surgical History:  Procedure Laterality Date  . CARDIAC CATHETERIZATION N/A 06/15/2015   Procedure: Left Heart Cath and Coronary Angiography;  Surgeon: Peter M Martinique, MD;  Location: [facility];  Service: Cardiovascular;  Laterality: N/A;  . HERNIA REPAIR  7th grade   umbilical    Social History   Social History  . Marital status: Divorced    Spouse name: n/a  . Number of children: 1  . Years of education: 12+   Occupational History  . Security [employer]   Social History Main Topics  . Smoking status: Never Smoker  . Smokeless tobacco: Never Used  . Alcohol use No  . Drug use: No  . Sexual activity: Not Currently     Comment: Erectile dysfunciotn   Other Topics Concern  . Not on file   Social History Narrative   Divorced. Education: [employer].    Unable to exercise due to significant SOB and DOE.    Lives alone.   Son lives in [place].    Current Outpatient Prescriptions on File Prior to Visit  Medication Sig Dispense Refill  . Albuterol Sulfate (PROAIR RESPICLICK) 175 (90 BASE) MCG/ACT AEPB Inhale 2 puffs into the lungs every 6 (six) hours as needed. 1  each 3  . amLODipine (NORVASC) 5 MG tablet Take 1 tablet (5 mg total) by mouth 2 (two) times daily. 180 tablet 3  . aspirin EC 81 MG tablet Take 81 mg by mouth daily.    . carvedilol (COREG) 25 MG tablet Take 1 tablet (25 mg total) by mouth 2 (two) times daily. 60 tablet 11  . fluticasone furoate-vilanterol (BREO ELLIPTA) 100-25 MCG/INH AEPB Inhale 1 puff into the lungs daily. 28 each 11  . furosemide (LASIX) 20 MG tablet TAKE 2 TABLETS (40 MG TOTAL) BY MOUTH EVERY MORNING. 180 tablet 2  . glucose blood (ONETOUCH VERIO) test strip 1 each by Other route 2 (two) times daily as needed for other. And lancets 2/day 100 each 12  . losartan (COZAAR) 100 MG tablet TAKE 1 TABLET (100 MG TOTAL) BY MOUTH DAILY. 30 tablet 2  . rosuvastatin (CRESTOR) 20 MG tablet Take 1 tablet (20 mg total) by mouth daily. 90 tablet 3  . Spacer/Aero-Holding Chambers (AEROCHAMBER MV) inhaler Use as instructed 1 each 0   No current facility-administered medications on file prior to visit.     Allergies  Allergen Reactions  . Lipitor [Atorvastatin] Hives and Itching    Family History  Problem Relation Age of Onset  . Arthritis Mother   . COPD Mother   . Diabetes Mother     was thin, took insulin  .  Hypertension Mother   . Hypertension Father   . Arthritis Sister     rheumatoid  . COPD Sister   . Diabetes Brother   . Colon cancer Neg Hx     BP (!) 134/94   Pulse 82   Ht 5\' 11"  (1.803 m)   Wt 181 lb (82.1 kg)   SpO2 95%   BMI 25.24 kg/m    Review of Systems He denies hypoglycemia.     Objective:   Physical Exam VITAL SIGNS:  See vs page GENERAL: no distress Pulses: dorsalis pedis intact bilat.   MSK: no deformity of the feet CV: no leg edema Skin:  no ulcer on the feet, but there are bilat calluses.  normal color and temp on the feet. Neuro: sensation is intact to touch on the feet Ext: There is bilateral onychomycosis of the toenails.    A1c=9.6%  Lab Results  Component Value Date    CREATININE 1.78 (H) 12/29/2015   BUN 27 (H) 12/29/2015   NA 133 (L) 12/29/2015   K 4.8 12/29/2015   CL 99 12/29/2015   CO2 30 12/29/2015      Assessment & Plan:  Insulin-requiring type 2 DM, with renal insufficiency: he needs increased rx.    Patient is advised the following: Patient Instructions  check your blood sugar 4 times a day.  vary the time of day when you check, between before the 3 meals, and at bedtime.  also check if you have symptoms of your blood sugar being too high or too low.  please keep a record of the readings and bring it to your next appointment here.  You can write it on any piece of paper.  please call us sooner if your blood sugar goes below 70, or if you have a lot of readings over 200.  I have sent a prescription to your pharmacy, to increase the lantus to 25 units daily, and: Stop taking the glipizide.   Please come back for a follow-up appointment in 2 weeks.

## 2016-06-18 NOTE — Patient Instructions (Addendum)
check your blood sugar 4 times a day.  vary the time of day when you check, between before the 3 meals, and at bedtime.  also check if you have symptoms of your blood sugar being too high or too low.  please keep a record of the readings and bring it to your next appointment here.  You can write it on any piece of paper.  please call us sooner if your blood sugar goes below 70, or if you have a lot of readings over 200.  I have sent a prescription to your pharmacy, to increase the lantus to 25 units daily, and: Stop taking the glipizide.   Please come back for a follow-up appointment in 2 weeks.

## 2016-07-02 ENCOUNTER — Other Ambulatory Visit (INDEPENDENT_AMBULATORY_CARE_PROVIDER_SITE_OTHER): Payer: BLUE CROSS/BLUE SHIELD

## 2016-07-02 ENCOUNTER — Encounter: Payer: Self-pay | Admitting: *Deleted

## 2016-07-02 ENCOUNTER — Ambulatory Visit (INDEPENDENT_AMBULATORY_CARE_PROVIDER_SITE_OTHER): Payer: BLUE CROSS/BLUE SHIELD | Admitting: Pulmonary Disease

## 2016-07-02 ENCOUNTER — Ambulatory Visit (INDEPENDENT_AMBULATORY_CARE_PROVIDER_SITE_OTHER)
Admission: RE | Admit: 2016-07-02 | Discharge: 2016-07-02 | Disposition: A | Discharge status: Home / Self Care | Payer: BLUE CROSS/BLUE SHIELD | Source: Ambulatory Visit | Attending: Pulmonary Disease | Admitting: Pulmonary Disease

## 2016-07-02 VITALS — BP 150/92 | HR 70 | Temp 97.4°F | Ht 70.0 in | Wt 180.2 lb

## 2016-07-02 DIAGNOSIS — R9389 Abnormal findings on diagnostic imaging of other specified body structures: Secondary | ICD-10-CM

## 2016-07-02 DIAGNOSIS — N289 Disorder of kidney and ureter, unspecified: Secondary | ICD-10-CM

## 2016-07-02 DIAGNOSIS — E0821 Diabetes mellitus due to underlying condition with diabetic nephropathy: Secondary | ICD-10-CM

## 2016-07-02 DIAGNOSIS — I5022 Chronic systolic (congestive) heart failure: Secondary | ICD-10-CM

## 2016-07-02 DIAGNOSIS — D869 Sarcoidosis, unspecified: Secondary | ICD-10-CM

## 2016-07-02 DIAGNOSIS — R7309 Other abnormal glucose: Secondary | ICD-10-CM

## 2016-07-02 DIAGNOSIS — I429 Cardiomyopathy, unspecified: Secondary | ICD-10-CM

## 2016-07-02 DIAGNOSIS — Z23 Encounter for immunization: Secondary | ICD-10-CM

## 2016-07-02 DIAGNOSIS — R938 Abnormal findings on diagnostic imaging of other specified body structures: Secondary | ICD-10-CM

## 2016-07-02 DIAGNOSIS — I10 Essential (primary) hypertension: Secondary | ICD-10-CM

## 2016-07-02 DIAGNOSIS — E785 Hyperlipidemia, unspecified: Secondary | ICD-10-CM

## 2016-07-02 DIAGNOSIS — Z794 Long term (current) use of insulin: Secondary | ICD-10-CM

## 2016-07-02 HISTORY — DX: Sarcoidosis, unspecified: D86.9

## 2016-07-02 LAB — COMPREHENSIVE METABOLIC PANEL
ALT: 23 U/L (ref 0–53)
AST: 25 U/L (ref 0–37)
Albumin: 3.7 g/dL (ref 3.5–5.2)
Alkaline Phosphatase: 150 U/L — ABNORMAL HIGH (ref 39–117)
BUN: 27 mg/dL — ABNORMAL HIGH (ref 6–23)
CALCIUM: 9.6 mg/dL (ref 8.4–10.5)
CHLORIDE: 102 meq/L (ref 96–112)
CO2: 30 meq/L (ref 19–32)
CREATININE: 2.02 mg/dL — AB (ref 0.40–1.50)
GFR: 44.63 mL/min — ABNORMAL LOW (ref >60.00)
GLUCOSE: 261 mg/dL — AB (ref 70–99)
Potassium: 4.7 mEq/L (ref 3.5–5.1)
Sodium: 136 mEq/L (ref 135–145)
Total Bilirubin: 0.3 mg/dL (ref 0.2–1.2)
Total Protein: 7.8 g/dL (ref 6.0–8.3)

## 2016-07-02 LAB — HEMOGLOBIN A1C
HEMOGLOBIN A1C: 10.5 % — AB (ref <5.7)
Mean Plasma Glucose: 255 mg/dL

## 2016-07-02 NOTE — Patient Instructions (Signed)
Today we updated your med list in our EPIC system...    Continue your current medications the same...  Continue your 4 BP meds the same...  Continue the LANTUS 25u daily per DrEllison, plus the low carb/ NO SWEETS diabetic diet...  Today we rechecked your CXR & BLOOD work for Sarcoidosis...    We will contact you w/ the results when available...   We are continuing to HOLD on treatment for the sarcoid since you are asymptomatic and your diabetes is not yet under adequate control...  Stay as active as possible...  Call for any questions...  Let's plan a follow up visit in 2mo, sooner if needed for any breathing problems.Marland KitchenMarland Kitchen

## 2016-07-03 ENCOUNTER — Encounter: Payer: Self-pay | Admitting: Pulmonary Disease

## 2016-07-03 DIAGNOSIS — I10 Essential (primary) hypertension: Secondary | ICD-10-CM

## 2016-07-03 HISTORY — DX: Essential (primary) hypertension: I10

## 2016-07-03 LAB — ANGIOTENSIN CONVERTING ENZYME: Angiotensin-Converting Enzyme: 118 U/L — ABNORMAL HIGH (ref 9–67)

## 2016-07-03 NOTE — Progress Notes (Signed)
Subjective:     Patient ID: Brandon Carroll, male   DOB: 05-Dec-1960, 55 y.o.   MRN: [id number]  HPI  ~  June 08, 2015:  Initial pulmonary consult by SN>        48 y/o man referred by Harrison Mons PA, [facility] for a pulmonary evaluation to r/o Sarcoidosis;  He has a history of a cardiomyopathy, chronic systolic CHF w/ ID=78-24%, HBP, DM, HL, etc;  He was [facility] 12/19 - 05/05/15 by [facility] after presenting w/ dyspnea, PND & 2 pillow orthopnea;  CXR showed bilat effusions, some incr markings and prob LLL atx;  2DEcho showed a cardiomyopathy w/ EF=40-45%;  Labs showed elev BNP=971, mild renal insuffic w/ Cr=1.4-1.6, mild anemia w/ Hg=11-12 and small cells w/ MCV=76;  Pt was diuresed w/ Lasix & lost 7# of fluid w/ improvement in his breathing (also given empiric Levaquin rx) and he was disch for outpt follow up... CT Chest performed 05/04/15 showed scattered bilat pulm nodules (largest=25m in RUL) and mild adenopathy (R paratrach/ subcarinal/ aortopulm window/ & ?bilat hilar regions), norm heart size, coronary atherosclerosis noted, bilat effusions and basilar atx...        For his part the patient states that his SOB has completely resolved (w/ treatment of his CHF);  He notes min cough, small amt of clear sput, no hemoptysis, denies f/c/s, no CP;  He notes some DOE but able to perform his duties at [employer] & down stairs now...   Smoking Hx>  He is a never smoker...  Pulmonary Hx>  He has no prev hx of lung disease- no hx asthma, bronchitis, pneumonia, Tb or exposure.  Medical Hx>  As below-- HBP, CHF/cardiomyopathy (never seen by Cards), DM, HL, RI, anemia...  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  Current Meds>  AlbutHFA prn, Mucinex, Coreg, Calan, Lisinopril, Lasix, Mevacor, Glucotrol EXAM shows Afeb, VSS, O2sat=98% on RA;  Heent- neg, mallampati2;  Chest- clear w/o w/r/r;  Heart-  RR w/o m/r/g;  Abd- soft, nontender, neg;  Ext- neg w/o c/c/e;  Neuro- intact w/o focal deficits...  CXR 05/02/15 showed bilat effusions, some incr markings and prob LLL atx  EKG 05/02/15 showed NSR, rate73/min, +STTWA- consider inferolat ischemia...  2DEcho 05/03/15 showed mild LVH, mod reduced LVF w/ EF=40-45% & diffuse HK; MV & AoV are wnl, LA is mod dilated, RA is mildly dilated  Labs in Epic from 04/2015 showed elev BNP=971, mild renal insuffic w/ Cr=1.4-1.6, mild anemia w/ Hg=11-12 and small cells w/ MCV=76   CT Chest performed 05/04/15 showed scattered bilat pulm nodules (largest=62min RUL) and mild adenopathy (R paratrach/ subcarinal/ aortopulm window/ & ?bilat hilar regions), norm heart size, coronary atherosclerosis noted, bilat effusions and basilar atx...    Spirometry 06/08/15 showed FVC=3.36 (81%), FEV1=2.62 (79%), %1sec=78, mid-flows are sl reduced at 61% predicted; This is c/w very mild airflow obstruction esp in small airways, can't r/o superimposed restriction w/o LV measurement.   Ambulatory Oximetry 06/08/15>  O2sat=100% on RA at rest w/ pulse=66/min; He ambulated 3 laps w/o difficulty, lowest O2sat=99% w/ pulse=74/min...  IMP >>    1) Abn CXR & CTChest w/ scattered bilat pulm nodules (largest=54m58mn RUL) and mild adenopathy seen (R paratrach/ subcarinal/ aortopulm window/ & ?bilat hilar regions)> the Adm CXR 04/2015 appeared to me to be c/w CHF w/ bilat effusions, interstitial edema, and some LLL atx (clinical  picture was not that of pneumonia w/o fever/ purulent sput, normal WBC ct, neg pneumococcal & legionella Ag, etc); radiology wondered about sarcoidosis, pt was on Lisinopril at time of Adm & therefore no ACE level checked => we switched Lisinopril to LOSARTAN100 today...     2) HBP> controlled on Coreg 12.5- 1/2Bid, Calan120Bid, Lisinopril40, Lasix20-2Bid => we switched the ACE to ARB as above...    3) Cardiomyopathy w/ chronic systolic CHF and FT=73-22% by 2DEcho> BNP in  hosp was 971 & he diuresed 7+lbs of fluid w/ improvement in his breathing; he has not seen Cards & they didn't call consult in [place]; CTChest alluded to calcif in coronaries and he needs Cards consult for Cath and on-going treatment for his CHF.Marland KitchenMarland Kitchen     4) Hyperlipidemia> on Mevacor20 per PCP.Marland KitchenMarland Kitchen    5) Diabetes Mellitus> he has seen DrEllison in the past, A1c=7.0 04/2015 Hosp, urinalysis was pos for proteinuria; Prev on LANTUS & Metformin, now just on Glucotrol?    6) Renal insuffic> baseline Cr appears to be ~1.2-1.3, Cr in [place] was 1.4-1.6 w/ diuresis, then back to 1.3    7) Elev Alk Phos> this fractionated to liver but GOT/GPT are wnl    8) Anemia w/ small cells> Hg in Hosp was 11.9 w/ MCV=76; baseline in Epic ~12-13 w/o Iron studies or anemia eval previously done...  PLAN >>     Brandon Carroll appears to have a major heart problem and needs to see Cardiology 1st- we will refer ASAP so they can manage his cardiomyopathy & eval for poss underlying CAD;  From the pulmonary perspective he is a never smoker & has no prior hx of any lung problem;  He has an abn CXR & CT Chest as noted, only mild PFT abnormality & I am not yet ready to commit him to a lung Bx or mediastinoscopy & bx to cement the dx of sarcoidosis;  For now we will switch his Lisinopril to Losartan (so we can check ACE level later) and we will plan ROV in 2-42mow/ CXR at that time (he will need f/u CT at a later date).  ~  August 11, 2015:  233moOV & Brandon Carroll saw CARDS, DrRandolph & her notes are reviewed> Hypertensive heart dis, chr sys & diastolic CHF, mult coronary risk factors;  They adjusted his BP meds & did a heart CATH 06/15/15 showing trivial CAD w/ 10% LAD & RCA, and elev LV filling pressures;  DOE w/ min exertion persisted and his Lasix was adjusted w/ improvement;  Off the ACE inhibitor his ACE level was checked and ret at 41 (8-52) confirming outr opinion that he does not have active sarcoidosis;  He was placed on Breo daily & Albut HFA for  prn use- ?any benefit... We reviewed the following medical problems during today's office visit >>     Abn CXR & CTChest w/ scattered bilat pulm nodules (largest=77m72mn RUL) and mild adenopathy seen (R paratrach/ subcarinal/ aortopulm window/ & ?bilat hilar regions)> the Adm CXR appeared to me to be c/w CHF w/ bilat effusions, interstitial edema, and some LLL atx (clinical picture was not that of pneumonia w/o fever/ purulent sput, normal WBC ct, neg pneumococcal & legionella Ag, etc); radiology wondered about sarcoidosis, pt was on Lisinopril at time of Adm & therefore no ACE level checked => we switched Lisinopril to LOSARTAN100 & checked ACE level 07/05/15=> 41 (norm8-52) confirming our clinical impression of no active sarcoid... He wqas placed on BREO & ALbutHFA  rescue inhaler in the interim as a trial- no real change noted (Spirometry 05/2015 was OK x mild small airways dis).    HBP> better controlled on Coreg 25Bid, Amlod5, Losar100, Lasix40/d;  BP= 146/94 and he says breathing is improved, feeling better, no CP/ palpit/ edema...    Cardiomyopathy w/ chronic systolic CHF and JM=42-68% by 2DEcho> BNP in hosp was 971 & he diuresed 7+lbs of fluid w/ improvement in his breathing; CTChest alluded to calcif in coronaries and he was seen by Ivar Drape- referred for CATH 06/10/15 showing triv CAD (10% LAD & RCA) and elev LV filling pressures c/w a non-ischemic cardiomyopathy;  He is improved w/ better BP control & CHF rx per Cards...    Hyperlipidemia> prev on Mevacor20 w/ FLP 07/26/15 showing TChol 254, TG 109, HDL 74, LDL 158; DrRandolph changed Mevacor20 to [place]...    Diabetes Mellitus> he has seen DrEllison in the past, A1c=7.0 04/2015 Hosp, urinalysis was pos for proteinuria; Prev on LANTUS & Metformin, now just on Glucotrol10Bid, BS=150-160 w/ A1c=7.4 (06/21/15)    Renal insuffic> baseline Cr appears to be ~1.2-1.3, Cr in [place] was 1.4-1.6 w/ diuresis ^ improvement in CHF...    Elev Alk Phos> this  fractionated to liver but GOT/GPT are wnl; PCP did Abd Sonar 07/08/15 showing 3.6 cm GB polyp, no stones, ducts ok & liver ok...    Anemia w/ small cells> Hg in Hosp was 11.9 w/ MCV=76; baseline in Epic ~12-13; repeat labs here 08/11/15=> Hb=13.1, MCV=77, Fe=89 (30%sat), Ferritin=60, B12=489, Folate=5.0 & rec OTC MVI + Folic Acid...    Rash>  He saw [facility] who felt rash was cutaneous fungal infection, not sarcoid, KOH was pos & rash resolved w/ Fluconazole & topical cream... EXAM shows Afeb, VSS (BP=146/94), O2sat=99% on RA;  Heent- neg, mallampati2;  Chest- clear w/o w/r/r;  Heart- RR w/o m/r/g;  Abd- soft, nontender, neg;  Ext- neg w/o c/c/e;  Neuro- intact w/o focal deficits...  LABS 08/11/15>  Chems- BS=318, Cr=1.62;  Hb=13.1, MCV=77, Fe=89 (30%sat), Ferritin=60, B12=489, Folate=5.0 & rec OTC MVI + Folic Acid... IMP/PLAN >>  His breathing is much improved on Lasix diuresis & adjustment in BP/ CHF meds- doubt much benefit from [facility];  F/u labs today show BS=318 on his Glucotrol alone- needs f/u DrEllison for Endocrine;  Anemia labs show Hg back to norm at 13 but cells still small & HAVE ALWAYS BEEN SMALL suggesting prob thallassemia minor, but Folic level sl low so we will rec MVI + OTC Folate... Plan is to see him back in June- time for CXR & f/u CT Chest to check the nodules seen 04/2015...   ~  December 29, 2015:  59moROV & JZacheryis doing reasonably well from the pulm standpoint- notes sl dry cough, OTC Delsym helps, no SOB/ CP/ palpit/ sput/ hemoptysis/ f/c/s etc;  He is exercising at a gym on the treadmill he says;; Meds costs are a real issue for him- he ran out of Breo & couldn't afford refill;  [facility] 09/2015 was >300 w/ A1c=11.3 & they added FWilder Glade(which he could not afford to refill)... We reviewed the following medical problems during today's office visit >>     Abn CXR & CTChest, Elev ACE level=> c/w SARCOIDOSIS>   ~  04/2015 Hosp>  CTChest  04/2015 showed scattered bilat pulm nodules (largest=677min RUL) and mild adenopathy (R paratrach/ subcarinal/ aortopulm window/ & ?bilat hilar regions); CXR was  c/w CHF w/ bilat effusions, interstitial edema, and some LLL atx (clinical picture was not that of pneumonia w/o fever/ purulent sput, normal WBC ct, neg pneumococcal & legionella Ag, etc); radiology wondered about sarcoidosis, pt was on Lisinopril at that time & therefore no ACE level checked =>  ~  06/08/15 consult>  we switched Lisinopril to LOSARTAN100 & checked ACE level 07/05/15=> 41 (norm8-52) suggesting no active sarcoid...  ~  07/2015>  He was placed on BREO & ALbutHFA rescue inhaler in the interim as a trial- no real change noted & too $$ (Spirometry 05/2015 was OK x mild small airways dis)...  ~  12/29/15>  Due for f/u CT Chest (+bilat patchy GGO & nodularity w/ adenopathy c/w sarcoid), and ACE level (elev >100 at this time)=> all c/w active sarcoid at this time, consider options for treatment w/ or w/o lung bx...    HBP> controlled on Coreg 25Bid, Amlod5Bid, Losartan100, Lasix40/d;  BP= 136/82 and he says breathing is at baseline, feeling well- no CP/ palpit/ edema...    Nonischemic Cardiomyopathy w/ chronic systolic CHF and GU=54-27% by 2DEcho> BNP in hosp 04/2015 was 971 & he diuresed 7+lbs of fluid w/ improvement in his breathing; CTChest alluded to calcif in coronaries and he was seen by Ivar Drape 05/2015- CATH showing triv CAD (10% LAD & RCA) and elev LV filling pressures c/w a non-ischemic cardiomyopathy;  He is improved w/ better BP control & CHF rx per Cards...    Hyperlipidemia> prev on Mevacor20 w/ FLP 07/26/15 showing TChol 254, TG 109, HDL 74, LDL 158; DrRandolph changed Mevacor20 to [place] & now on Crestor20...    Diabetes Mellitus> seen DrEllison 11/2014 & placed on Lantus/ Humalog; pt couldn't afford & never followed up, 04/2015 Hosp- A1c=7.0 & disch on Glucotrol 10Bid w/ BS=150-160 w/ A1c=7.4 (06/21/15); but since then BS>300 &  A1c>11.0 and he desperately needs Endocrine management of DM w/ need for Prednisone Rx of his Sarcoid!    Renal insuffic> baseline Cr appears to be ~1.2-1.3, Cr in [place] was 1.4-1.6 w/ diuresis & improvement in CHF; most recent Cr= 1.6-1.8 range...    Elev Alk Phos> this fractionated to liver but GOT/GPT are wnl; PCP did Abd Sonar 07/08/15 showing 3.6 cm GB polyp, no stones, ducts ok & liver ok...    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    Rash>  He saw [facility] who felt rash was cutaneous fungal infection, not sarcoid, KOH was pos & rash resolved w/ Fluconazole & topical cream... EXAM shows Afeb, VSS, O2sat=97% on RA;  Heent- neg, mallampati2;  Chest- clear w/o w/r/r;  Heart- RR w/o m/r/g;  Abd- soft, nontender, neg;  Ext- neg w/o c/c/e;  Neuro- intact w/o focal deficits...  CXR 12/29/15>  Norm heart size & vascularity, clear lungs, no effusions, sl fullness in hilum, NAD...  LABS 12/29/15>  Chems- BS=348, A1c=11.0, BUN=27. Cr=1.78;  Sed=63;  ACE>100...  CT Chest done 01/03/16>  Norm heart size, Ao & coronary atherosclerosis, norm caliber PAs, mildly enlarged mediastinal & hilar nodes, scat bilat mid & upper lobe nodularity w/ dominant 75m RUL nodule unchanged, mild patchy GGO in bilat upper lobes w/ dominant LUL 1.2cm nodule which is new => all felt to be c/w sarcoid. IMP/PLAN>>  Brandon Carroll has a pattern of pulm abnormalities that are concordant w/ the diagnosis of  active sarcoidosis at this time; we do not have a biopsy for confirmation but he would like to avoid lung bx if poss & I indicated that I would be willing to treat him for this condition & follow his response very closely so long as he was also under the care of a diabetic specialist at the same time;  His DM is poorly controlled & he will need active  management of this condition w/ meds he can afford to fill (ie- prob NPH & Reg inulin or mixed 70/30 or similar) while we place him on Pred & taper it as rapidly as possible... I would like him to see the diabetologist before we consider starting the Pred rx...    ~  July 02, 2016:  117moROV & JInrihas established w/ DrEllison for DM management, unfortunately his control remains poor- a combination of his severe disease and poor compliance, now w/ slowly progressive renal insuffic & needs to establish w/ Nephrology; fortunately his presumed sarcoid is stable w/o signs of progressive pulmonary or reticuloendothelial disease...  ~  Since he was here last he has seen DrEllison twice- 1st was 02/08/16 & 2nd 06/18/16> on LANTUS 10=> incr to 25u recently w/ LABS showing A1c=9.6..Marland Kitchen  ~  His PCP is listed as [facility] [place]- [place] but he hasn't been seen in some time (last 5/17)...    HBP> controlled on Coreg 25Bid, Amlod5Bid, Losartan100, Lasix40/d;  BP= 136/82 and he says breathing is at baseline, feeling well- no CP/ palpit/ edema...    Nonischemic Cardiomyopathy w/ chronic systolic CHF and EQZ=00-92%by 2DEcho> BNP in hosp 04/2015 was 971 & he diuresed 7+lbs of fluid w/ improvement in his breathing; CTChest alluded to calcif in coronaries and he was seen by DIvar Drape1/2017- CATH showing triv CAD (10% LAD & RCA) and elev LV filling pressures c/w a non-ischemic cardiomyopathy;  He is improved w/ better BP control & CHF rx per Cards...    Hyperlipidemia> prev on Mevacor20 w/ FLP 07/26/15 showing TChol 254, TG 109, HDL 74, LDL 158; DrRandolph [place] now on Crestor20...    Diabetes Mellitus> seen DrEllison 11/2014 & placed on Lantus/ Humalog; pt couldn't afford & never followed up, 04/2015 Hosp- A1c=7.0 & disch on Glucotrol 10Bid w/ BS=150-160 w/ A1c=7.4 (06/21/15); but since then BS>300 & A1c>11.0 and he desperately needs Endocrine management of DM w/ need for Prednisone Rx of his  Sarcoid!    Renal insuffic> baseline Cr appears to be ~1.2-1.3, [place] 1[place] w/ diuresis & improvement in CHF; most recent Cr= 1.6-1.8 range...    Elev Alk Phos> this fractionated to liver but GOT/GPT are wnl; PCP did Abd Sonar 07/08/15 showing 3.6 cm GB polyp, no stones, ducts ok & liver ok...    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    Rash>  He saw [facility] who felt rash was cutaneous fungal infection, not sarcoid, KOH was pos & rash resolved w/ Fluconazole & topical cream...    Poor compliance w/ medications> I called [facility] [employer] on [address] (he gets is ral meds here & no Breo since 8/17, last filled Coreg&Losar 11/17 (167mo Lasix 11/17 (37m5moAmlod1/18 (37mo89moe gets his insulin at [facility] [employer] Lantus 10u/d-37mo 67moly 02/13/16, then new rx for 25u/d filled 06/23/16 (17mo38mo  supply)...  EXAM shows Afeb, VSS w/ BP=150/90 despite poor med compliance, O2sat=100% on RA;  Heent- neg, mallampati2;  Chest- clear w/o w/r/r;  Heart- RR w/o m/r/g;  Abd- soft, nontender, neg;  Ext- neg w/o c/c/e;  Neuro- intact.   CXR 07/02/16>  CXR is stable/ unchanged; norm heart size, clear lungs, NAD- no change in lymph nodes...  LABS 07/02/16>  Chems- BS=261, A1c=10.5 despite recent incr to 25u Lantus;  BUN=27, Cr=2.02 (ClCr=45) sl worse on his Lasix40;  LFTs are ok & AlkPhos is better- down to 150;  ACE=118 same as prev. IMP/PLAN>>  Brandon Carroll has some serious medical problems- HBP, Cardiomyopathy w/ chr SysCHF, IDDM, renal insuffic;  As noted he also appears to have sarcoidosis w/ mild adenopathy & scattered nodules, elev AlkPhos, elev ACE- but these appear stable & non-progressive, we continue to follow carefully, but his other medical issues (BP, heart, DM, now renal) are much more serious and slowly  progressive... I maintain that he needs aggressive DM management to get control of his BS, PCP & cardiac management of his BP & cardiomyoopathy, and now renal referral for there eval in light of his slowly worsening creat...     Past Medical History:  Diagnosis Date  . Acute systolic CHF (congestive heart failure), NYHA class 2 ([place]) 05/04/2015  . Acute-on-chronic kidney injury ([place]) 05/04/2015  . Diabetes mellitus   . Erectile dysfunction   . Hyperlipidemia   . Hypertension     Past Surgical History:  Procedure Laterality Date  . CARDIAC CATHETERIZATION N/A 06/15/2015   Procedure: Left Heart Cath and Coronary Angiography;  Surgeon: Peter M Martinique, MD;  Location: [facility];  Service: Cardiovascular;  Laterality: N/A;  . HERNIA REPAIR  7th grade   umbilical    Outpatient Encounter Prescriptions as of 07/02/2016  Medication Sig  . Albuterol Sulfate (PROAIR RESPICLICK) 478 (90 BASE) MCG/ACT AEPB Inhale 2 puffs into the lungs every 6 (six) hours as needed.  Marland Kitchen amLODipine (NORVASC) 5 MG tablet Take 1 tablet (5 mg total) by mouth 2 (two) times daily.  Marland Kitchen aspirin EC 81 MG tablet Take 81 mg by mouth daily.  . carvedilol (COREG) 25 MG tablet Take 1 tablet (25 mg total) by mouth 2 (two) times daily.  . fluticasone furoate-vilanterol (BREO ELLIPTA) 100-25 MCG/INH AEPB Inhale 1 puff into the lungs daily.  . furosemide (LASIX) 20 MG tablet TAKE 2 TABLETS (40 MG TOTAL) BY MOUTH EVERY MORNING.  Marland Kitchen glucose blood (ONETOUCH VERIO) test strip 1 each by Other route 2 (two) times daily as needed for other. And lancets 2/day  . Insulin Glargine (LANTUS SOLOSTAR) 100 UNIT/ML Solostar Pen Inject 25 Units into the skin daily. And pen needles 1/day  . losartan (COZAAR) 100 MG tablet TAKE 1 TABLET (100 MG TOTAL) BY MOUTH DAILY.  . rosuvastatin (CRESTOR) 20 MG tablet Take 1 tablet (20 mg total) by mouth daily.  Marland Kitchen Spacer/Aero-Holding Chambers (AEROCHAMBER MV) inhaler Use as instructed   No  facility-administered encounter medications on file as of 07/02/2016.     Allergies  Allergen Reactions  . Lipitor [Atorvastatin] Hives and Itching    Immunization History  Administered Date(s) Administered  . Influenza, Seasonal, Injecte, Preservative Fre 07/22/2012  . Influenza,inj,Quad PF,36+ Mos 08/04/2014, 07/02/2016  . Pneumococcal Polysaccharide-23 07/22/2012, 05/03/2015  . Tdap 05/14/2010    Current Medications, Allergies, Past Medical History, Past Surgical History, Family History, and Social History were reviewed in [employer] record.   Review of Systems  All symptoms NEG except where BOLDED >>  Constitutional:  F/C/S, fatigue, anorexia, unexpected weight change. HEENT:  HA, visual changes, hearing loss, earache, nasal symptoms, sore throat, mouth sores, hoarseness. Resp:  cough, sputum, hemoptysis; SOB, tightness, wheezing. Cardio:  CP, palpit, DOE, orthopnea, edema. GI:  N/V/D/C, blood in stool; reflux, abd pain, distention, gas. GU:  dysuria, freq, urgency, hematuria, flank pain, voiding difficulty. MS:  joint pain, swelling, tenderness, decr ROM; neck pain, back pain, etc. Neuro:  HA, tremors, seizures, dizziness, syncope, weakness, numbness, gait abn. Skin:  suspicious lesions or skin rash. Heme:  adenopathy, bruising, bleeding. Psyche:  confusion, agitation, sleep disturbance, hallucinations, anxiety, depression suicidal.   Objective:   Physical Exam       Vital Signs:  Reviewed...   General:  WD, WN, 56 y/o BM in NAD; alert & oriented; pleasant & cooperative... HEENT:  [place]/AT; Conjunctiva- pink, Sclera- nonicteric, EOM-wnl, PERRLA, Fundi-benign; EACs-clear, TMs-wnl; NOSE-clear; THROAT-clear & wnl.  Neck:  Supple w/ full ROM; no JVD; normal carotid impulses w/o bruits; no thyromegaly or nodules palpated; no lymphadenopathy.  Chest:  Clear to P & A; without wheezes, rales, or rhonchi heard. Heart:  Regular Rhythm; norm S1 & S2  without murmurs, rubs, or gallops detected. Abdomen:  Soft & nontender- no guarding or rebound; normal bowel sounds; no organomegaly or masses palpated. Ext:  Normal ROM; without deformities or arthritic changes; no varicose veins, venous insuffic, or edema;  Pulses intact w/o bruits. Neuro:  CNs II-XII intact; motor testing normal; sensory testing normal; gait normal & balance OK. Derm:  No lesions noted; no rash etc. Lymph:  No cervical, supraclavicular, axillary, or inguinal adenopathy palpated.   Assessment:      IMP >>    Abn CXR & CTChest, Elev ACE level=> c/w SARCOIDOSIS>  He would like to avoid lung bx if poss & I indicated that I would be willing to treat him w/o based on the strength of the current evidence (CT results + ACE level), but this will require him to under active DM management by an Endocrinologist before Prednisone is started...    HBP> controlled on Rx, continue same...    Nonischemic Cardiomyopathy w/ chronic systolic CHF and LZ=76-73% by 2DEcho> managed by Cards, DrRandolph- continue her meds...    Hyperlipidemia> he lists Cres20 currently, he will need f/u FLP by his PCP later...    Diabetes Mellitus> poorly controlled & we reviewed DM diet & need for active Diabetologist involvement in his care, esp in light of need for Pred rx for the sarcoid...     Elev Alk Phos> this fractionated to liver but GOT/GPT are wnl; PCP did Abd Sonar 07/08/15 showing 3.6 cm GB polyp, no stones, ducts ok & liver ok...    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    Rash>  He saw Derm w/ +KOH & improved w/ their rx...  PLAN >>  06/08/15>  Brandon Carroll appears to have a major heart problem and needs to see Cardiology 1st- we will refer ASAP so they can manage his cardiomyopathy & eval for poss underlying CAD;  From the pulmonary perspective he is a never smoker & has no prior hx of any lung problem;  He has an abn CXR & CT Chest as noted, only mild PFT abnormality & I am  not yet ready to commit him to a lung Bx or mediastinoscopy & bx to cement the dx of sarcoidosis;  For now we will switch his Lisinopril to Losartan (so  we can check ACE level later) and we will plan ROV in 2-102mow/ CXR at that time (he will need f/u CT at a later date) 08/11/15>  His breathing is much improved on Lasix diuresis & adjustment in BP/ CHF meds- [facility]  F/u labs today show BS=318 on his Glucotrol alone- needs f/u [facility];  Anemia labs show Hg back to norm at 13 but cells still small & HAVE ALWAYS BEEN SMALL suggesting prob thallassemia minor, but Folic level sl low so we will rec MVI + OTC Folate... Plan is to see him back in June- time for CXR & f/u CT Chest to check the nodules seen 04/2015...  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 I would like him to see the diabetologist before we start the Pred rx...  07/02/16>    Brandon Carroll has some serious medical problems- HBP, Cardiomyopathy w/ chr SysCHF, IDDM, renal insuffic;  As noted he also appears to have sarcoidosis w/ mild adenopathy & scattered nodules, elev AlkPhos, elev ACE- but these appear stable & non-progressive, we continue to follow carefully, but his other medical issues (BP, heart, DM, now renal) are much more serious and slowly progressive... I maintain that he needs aggressive DM management to get control of his BS, PCP &  cardiac management of his BP & cardiomyoopathy, and now renal referral for there eval in light of his slowly worsening creat...      Plan:     Patient's Medications  New Prescriptions   No medications on file  Previous Medications   ALBUTEROL SULFATE (PROAIR RESPICLICK) 1093(90 BASE) MCG/ACT AEPB    Inhale 2 puffs into the lungs every 6 (six) hours as needed.   AMLODIPINE (NORVASC) 5 MG TABLET    Take 1 tablet (5 mg total) by mouth 2 (two) times daily.   ASPIRIN EC 81 MG TABLET    Take 81 mg by mouth daily.   CARVEDILOL (COREG) 25 MG TABLET    Take 1 tablet (25 mg total) by mouth 2 (two) times daily.   FLUTICASONE FUROATE-VILANTEROL (BREO ELLIPTA) 100-25 MCG/INH AEPB    Inhale 1 puff into the lungs daily.   FUROSEMIDE (LASIX) 20 MG TABLET    TAKE 2 TABLETS (40 MG TOTAL) BY MOUTH EVERY MORNING.   GLUCOSE BLOOD (ONETOUCH VERIO) TEST STRIP    1 each by Other route 2 (two) times daily as needed for other. And lancets 2/day   INSULIN GLARGINE (LANTUS SOLOSTAR) 100 UNIT/ML SOLOSTAR PEN    Inject 25 Units into the skin daily. And pen needles 1/day   LOSARTAN (COZAAR) 100 MG TABLET    TAKE 1 TABLET (100 MG TOTAL) BY MOUTH DAILY.   ROSUVASTATIN (CRESTOR) 20 MG TABLET    Take 1 tablet (20 mg total) by mouth daily.  SPACER/AERO-HOLDING CHAMBERS (AEROCHAMBER MV) INHALER    Use as instructed  Modified Medications   No medications on file  Discontinued Medications   No medications on file

## 2016-07-04 ENCOUNTER — Telehealth: Payer: Self-pay | Admitting: Pulmonary Disease

## 2016-07-04 DIAGNOSIS — E1122 Type 2 diabetes mellitus with diabetic chronic kidney disease: Secondary | ICD-10-CM

## 2016-07-04 DIAGNOSIS — N289 Disorder of kidney and ureter, unspecified: Secondary | ICD-10-CM

## 2016-07-04 DIAGNOSIS — N183 Chronic kidney disease, stage 3 (moderate): Principal | ICD-10-CM

## 2016-07-04 NOTE — Telephone Encounter (Signed)
Spoke with pt. States that he is returning a call to SN. Pt's labs and CXR results are in.  SN - please advise. Thanks.

## 2016-07-05 ENCOUNTER — Ambulatory Visit (INDEPENDENT_AMBULATORY_CARE_PROVIDER_SITE_OTHER): Payer: BLUE CROSS/BLUE SHIELD | Admitting: Endocrinology

## 2016-07-05 ENCOUNTER — Encounter: Payer: Self-pay | Admitting: Endocrinology

## 2016-07-05 VITALS — BP 132/88 | HR 71 | Ht 70.0 in | Wt 178.0 lb

## 2016-07-05 DIAGNOSIS — E0821 Diabetes mellitus due to underlying condition with diabetic nephropathy: Secondary | ICD-10-CM

## 2016-07-05 DIAGNOSIS — Z794 Long term (current) use of insulin: Secondary | ICD-10-CM

## 2016-07-05 DIAGNOSIS — E1129 Type 2 diabetes mellitus with other diabetic kidney complication: Secondary | ICD-10-CM | POA: Diagnosis not present

## 2016-07-05 MED ORDER — INSULIN GLARGINE 100 UNIT/ML SOLOSTAR PEN: 35.0000 [IU] | PEN_INJECTOR | Freq: Every day | SUBCUTANEOUS | 99 refills | Status: DC

## 2016-07-05 NOTE — Patient Instructions (Addendum)
check your blood sugar 4 times a day.  vary the time of day when you check, between before the 3 meals, and at bedtime.  also check if you have symptoms of your blood sugar being too high or too low.  please keep a record of the readings and bring it to your next appointment here.  You can write it on any piece of paper.  please call us sooner if your blood sugar goes below 70, or if you have a lot of readings over 200.  I have sent a prescription to your pharmacy, to increase the lantus to 35 units daily.  Please come back for a follow-up appointment in 2-3 weeks.

## 2016-07-05 NOTE — Telephone Encounter (Signed)
Result note stated that SN has already spoke with pt.  Order has been placed for pt to be set up for appt with nephrology.  Nothing further is needed.

## 2016-07-05 NOTE — Progress Notes (Signed)
Subjective:    Patient ID: Brandon Carroll, male    DOB: 1960-08-09, 56 y.o.   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   DKA: never.  Severe hypoglycemia: never Pancreatitis: never Other: He works 1st shift, as a Forensic scientist.   Interval history: no cbg record, but states cbg's are still in the 200's.  pt states he feels well in general.   Past Medical History:  Diagnosis Date  . Acute systolic CHF (congestive heart failure), NYHA class 2 ([place]) 05/04/2015  . Acute-on-chronic kidney injury ([place]) 05/04/2015  . Diabetes mellitus   . Erectile dysfunction   . Hyperlipidemia   . Hypertension     Past Surgical History:  Procedure Laterality Date  . CARDIAC CATHETERIZATION N/A 06/15/2015   Procedure: Left Heart Cath and Coronary Angiography;  Surgeon: Peter M Martinique, MD;  Location: [facility];  Service: Cardiovascular;  Laterality: N/A;  . HERNIA REPAIR  7th grade   umbilical    Social History   Social History  . Marital status: Divorced    Spouse name: n/a  . Number of children: 1  . Years of education: 12+   Occupational History  . [employer]   Social History Main Topics  . Smoking status: Never Smoker  . Smokeless tobacco: Never Used  . Alcohol use No  . Drug use: No  . Sexual activity: Not Currently     Comment: Erectile dysfunciotn   Other Topics Concern  . Not on file   Social History Narrative   Divorced. Education: [employer].    Unable to exercise due to significant SOB and DOE.    Lives alone.   Son lives in [place].    Current Outpatient Prescriptions on File Prior to Visit  Medication Sig Dispense Refill  . Albuterol Sulfate (PROAIR RESPICLICK) 119 (90 BASE) MCG/ACT AEPB Inhale 2 puffs into the lungs every 6 (six) hours as needed. 1 each 3  . amLODipine (NORVASC) 5 MG  tablet Take 1 tablet (5 mg total) by mouth 2 (two) times daily. 180 tablet 3  . aspirin EC 81 MG tablet Take 81 mg by mouth daily.    . carvedilol (COREG) 25 MG tablet Take 1 tablet (25 mg total) by mouth 2 (two) times daily. 60 tablet 11  . fluticasone furoate-vilanterol (BREO ELLIPTA) 100-25 MCG/INH AEPB Inhale 1 puff into the lungs daily. 28 each 11  . furosemide (LASIX) 20 MG tablet TAKE 2 TABLETS (40 MG TOTAL) BY MOUTH EVERY MORNING. 180 tablet 2  . glucose blood (ONETOUCH VERIO) test strip 1 each by Other route 2 (two) times daily as needed for other. And lancets 2/day 100 each 12  . losartan (COZAAR) 100 MG tablet TAKE 1 TABLET (100 MG TOTAL) BY MOUTH DAILY. 30 tablet 2  . rosuvastatin (CRESTOR) 20 MG tablet Take 1 tablet (20 mg total) by mouth daily. 90 tablet 3  . Spacer/Aero-Holding Chambers (AEROCHAMBER MV) inhaler Use as instructed 1 each 0   No current facility-administered medications on file prior to visit.     Allergies  Allergen Reactions  . Lipitor [Atorvastatin] Hives and Itching    Family History  Problem Relation Age of Onset  . Arthritis Mother   . COPD Mother   . Diabetes Mother     was thin, took insulin  . Hypertension Mother   .  Hypertension Father   . Arthritis Sister     rheumatoid  . COPD Sister   . Diabetes Brother   . Colon cancer Neg Hx     BP 132/88   Pulse 71   Ht 5\' 10"  (1.778 m)   Wt 178 lb (80.7 kg)   SpO2 98%   BMI 25.54 kg/m   Review of Systems He denies hypoglycemia.      Objective:   Physical Exam VITAL SIGNS:  See vs page GENERAL: no distress Pulses: dorsalis pedis intact bilat.   MSK: no deformity of the feet CV: no leg edema Skin:  no ulcer on the feet, but there are bilat calluses.  normal color and temp on the feet. Neuro: sensation is intact to touch on the feet.  Ext: There is bilateral onychomycosis of the toenails.   Lab Results  Component Value Date   HGBA1C 10.5 (H) 07/02/2016      Assessment & Plan:    Insulin-requiring type 2 DM, with renal failure: he needs increased rx.   Patient is advised the following: Patient Instructions  check your blood sugar 4 times a day.  vary the time of day when you check, between before the 3 meals, and at bedtime.  also check if you have symptoms of your blood sugar being too high or too low.  please keep a record of the readings and bring it to your next appointment here.  You can write it on any piece of paper.  please call us sooner if your blood sugar goes below 70, or if you have a lot of readings over 200.  I have sent a prescription to your pharmacy, to increase the lantus to 35 units daily.  Please come back for a follow-up appointment in 2-3 weeks.

## 2016-07-27 ENCOUNTER — Ambulatory Visit (INDEPENDENT_AMBULATORY_CARE_PROVIDER_SITE_OTHER): Payer: BLUE CROSS/BLUE SHIELD | Admitting: Endocrinology

## 2016-07-27 VITALS — BP 156/82 | HR 77 | Wt 184.2 lb

## 2016-07-27 DIAGNOSIS — Z794 Long term (current) use of insulin: Secondary | ICD-10-CM | POA: Diagnosis not present

## 2016-07-27 DIAGNOSIS — E0821 Diabetes mellitus due to underlying condition with diabetic nephropathy: Secondary | ICD-10-CM

## 2016-07-27 MED ORDER — INSULIN GLARGINE 100 UNIT/ML SOLOSTAR PEN: 45.0000 [IU] | PEN_INJECTOR | Freq: Every day | SUBCUTANEOUS | 99 refills | Status: DC

## 2016-07-27 NOTE — Progress Notes (Signed)
Subjective:    Patient ID: Brandon Carroll, male    DOB: 05-03-1961, 56 y.o.   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   DKA: never.  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   Interval history: no cbg record, but states cbg's are still in the 200's.  pt states he feels well in general.   Past Medical History:  Diagnosis Date  . Acute systolic CHF (congestive heart failure), NYHA class 2 ([place]) 05/04/2015  . Acute-on-chronic kidney injury ([place]) 05/04/2015  . Diabetes mellitus   . Erectile dysfunction   . Hyperlipidemia   . Hypertension     Past Surgical History:  Procedure Laterality Date  . CARDIAC CATHETERIZATION N/A 06/15/2015   Procedure: Left Heart Cath and Coronary Angiography;  Surgeon: Peter M Martinique, MD;  Location: [facility];  Service: Cardiovascular;  Laterality: N/A;  . HERNIA REPAIR  7th grade   umbilical    Social History   Social History  . Marital status: Divorced    Spouse name: n/a  . Number of children: 1  . Years of education: 12+   Occupational History  . [employer]   Social History Main Topics  . Smoking status: Never Smoker  . Smokeless tobacco: Never Used  . Alcohol use No  . Drug use: No  . Sexual activity: Not Currently     Comment: Erectile dysfunciotn   Other Topics Concern  . Not on file   Social History Narrative   Divorced. Education: [employer].    Unable to exercise due to significant SOB and DOE.    Lives alone.   Son lives in [place].    Current Outpatient Prescriptions on File Prior to Visit  Medication Sig Dispense Refill  . Albuterol Sulfate (PROAIR RESPICLICK) 502 (90 BASE) MCG/ACT AEPB Inhale 2 puffs into the lungs every 6 (six) hours as  needed. 1 each 3  . amLODipine (NORVASC) 5 MG tablet Take 1 tablet (5 mg total) by mouth 2 (two) times daily. 180 tablet 3  . aspirin EC 81 MG tablet Take 81 mg by mouth daily.    . carvedilol (COREG) 25 MG tablet Take 1 tablet (25 mg total) by mouth 2 (two) times daily. 60 tablet 11  . fluticasone furoate-vilanterol (BREO ELLIPTA) 100-25 MCG/INH AEPB Inhale 1 puff into the lungs daily. 28 each 11  . furosemide (LASIX) 20 MG tablet TAKE 2 TABLETS (40 MG TOTAL) BY MOUTH EVERY MORNING. 180 tablet 2  . glucose blood (ONETOUCH VERIO) test strip 1 each by Other route 2 (two) times daily as needed for other. And lancets 2/day 100 each 12  . losartan (COZAAR) 100 MG tablet TAKE 1 TABLET (100 MG TOTAL) BY MOUTH DAILY. 30 tablet 2  . rosuvastatin (CRESTOR) 20 MG tablet Take 1 tablet (20 mg total) by mouth daily. 90 tablet 3  . Spacer/Aero-Holding Chambers (AEROCHAMBER MV) inhaler Use as instructed 1 each 0   No current facility-administered medications on file prior to visit.     Allergies  Allergen Reactions  . Lipitor [Atorvastatin] Hives and Itching    Family History  Problem Relation Age of Onset  . Arthritis Mother   . COPD Mother   . Diabetes Mother  was thin, took insulin  . Hypertension Mother   . Hypertension Father   . Arthritis Sister     rheumatoid  . COPD Sister   . Diabetes Brother   . Colon cancer Neg Hx     BP (!) 156/82 (BP Location: Left Arm, Patient Position: Sitting, Cuff Size: Normal)   Pulse 77   Wt 184 lb 3.2 oz (83.6 kg)   SpO2 97%   BMI 26.43 kg/m    Review of Systems He denies hypoglycemia.      Objective:   Physical Exam VITAL SIGNS:  See vs page.  GENERAL: no distress Pulses: dorsalis pedis intact bilat.   MSK: no deformity of the feet CV: no leg edema Skin:  no ulcer on the feet, but there are bilat calluses.  normal color and temp on the feet. Neuro: sensation is intact to touch on the feet.  Ext: There is bilateral onychomycosis of the  toenails.       Assessment & Plan:  Insulin-requiring type 2 DM, with renal failure: he needs increased rx.   Patient is advised the following: Patient Instructions  check your blood sugar 4 times a day.  vary the time of day when you check, between before the 3 meals, and at bedtime.  also check if you have symptoms of your blood sugar being too high or too low.  please keep a record of the readings and bring it to your next appointment here.  You can write it on any piece of paper.  please call us sooner if your blood sugar goes below 70, or if you have a lot of readings over 200.  I have sent a prescription to your pharmacy, to increase the lantus to 45 units daily.  Please come back for a follow-up appointment in 6 weeks.

## 2016-07-27 NOTE — Patient Instructions (Addendum)
check your blood sugar 4 times a day.  vary the time of day when you check, between before the 3 meals, and at bedtime.  also check if you have symptoms of your blood sugar being too high or too low.  please keep a record of the readings and bring it to your next appointment here.  You can write it on any piece of paper.  please call us sooner if your blood sugar goes below 70, or if you have a lot of readings over 200.  I have sent a prescription to your pharmacy, to increase the lantus to 45 units daily.  Please come back for a follow-up appointment in 6 weeks.

## 2016-07-29 ENCOUNTER — Other Ambulatory Visit: Payer: Self-pay | Admitting: Cardiovascular Disease

## 2016-07-30 NOTE — Telephone Encounter (Signed)
Will you please review for refill, thanks!

## 2016-08-15 ENCOUNTER — Other Ambulatory Visit (HOSPITAL_COMMUNITY): Payer: Self-pay | Admitting: Nephrology

## 2016-08-15 DIAGNOSIS — D869 Sarcoidosis, unspecified: Secondary | ICD-10-CM

## 2016-08-15 DIAGNOSIS — R809 Proteinuria, unspecified: Secondary | ICD-10-CM

## 2016-08-20 ENCOUNTER — Other Ambulatory Visit: Payer: Self-pay | Admitting: Cardiovascular Disease

## 2016-08-20 NOTE — Telephone Encounter (Signed)
Refill Request.  

## 2016-08-31 ENCOUNTER — Ambulatory Visit (HOSPITAL_COMMUNITY): Payer: BLUE CROSS/BLUE SHIELD

## 2016-09-07 ENCOUNTER — Ambulatory Visit (INDEPENDENT_AMBULATORY_CARE_PROVIDER_SITE_OTHER): Payer: BLUE CROSS/BLUE SHIELD | Admitting: Endocrinology

## 2016-09-07 ENCOUNTER — Encounter: Payer: Self-pay | Admitting: Endocrinology

## 2016-09-07 VITALS — BP 148/86 | HR 71 | Ht 70.0 in | Wt 183.0 lb

## 2016-09-07 DIAGNOSIS — Z794 Long term (current) use of insulin: Secondary | ICD-10-CM

## 2016-09-07 DIAGNOSIS — E0821 Diabetes mellitus due to underlying condition with diabetic nephropathy: Secondary | ICD-10-CM

## 2016-09-07 LAB — POCT GLYCOSYLATED HEMOGLOBIN (HGB A1C): Hemoglobin A1C: 8.6

## 2016-09-07 MED ORDER — LOSARTAN POTASSIUM 100 MG PO TABS: 100.0000 mg | ORAL_TABLET | Freq: Every day | ORAL | 0 refills | Status: DC

## 2016-09-07 MED ORDER — INSULIN GLARGINE 100 UNIT/ML SOLOSTAR PEN: 50.0000 [IU] | PEN_INJECTOR | SUBCUTANEOUS | 99 refills | Status: DC

## 2016-09-07 NOTE — Progress Notes (Signed)
Subjective:    Patient ID: Brandon Carroll, male    DOB: Mar 07, 1961, 56 y.o.   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   DKA: never.  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   Interval history: no cbg record, but states cbg's are in the 100's.  pt states he feels well in general.   Past Medical History:  Diagnosis Date  . Acute systolic CHF (congestive heart failure), NYHA class 2 ([place]) 05/04/2015  . Acute-on-chronic kidney injury ([place]) 05/04/2015  . Diabetes mellitus   . Erectile dysfunction   . Hyperlipidemia   . Hypertension     Past Surgical History:  Procedure Laterality Date  . CARDIAC CATHETERIZATION N/A 06/15/2015   Procedure: Left Heart Cath and Coronary Angiography;  Surgeon: Peter M Martinique, MD;  Location: [facility];  Service: Cardiovascular;  Laterality: N/A;  . HERNIA REPAIR  7th grade   umbilical    Social History   Social History  . Marital status: Divorced    Spouse name: n/a  . Number of children: 1  . Years of education: 12+   Occupational History  . [employer]   Social History Main Topics  . Smoking status: Never Smoker  . Smokeless tobacco: Never Used  . Alcohol use No  . Drug use: No  . Sexual activity: Not Currently     Comment: Erectile dysfunciotn   Other Topics Concern  . Not on file   Social History Narrative   Divorced. Education: [employer].    Unable to exercise due to significant SOB and DOE.    Lives alone.   Son lives in [place].    Current Outpatient Prescriptions on File Prior to Visit  Medication Sig Dispense Refill  . Albuterol Sulfate (PROAIR RESPICLICK) 732 (90 BASE) MCG/ACT AEPB Inhale 2 puffs into the lungs every 6 (six) hours as  needed. 1 each 3  . amLODipine (NORVASC) 5 MG tablet Take 1 tablet (5 mg total) by mouth 2 (two) times daily. 180 tablet 3  . aspirin EC 81 MG tablet Take 81 mg by mouth daily.    . carvedilol (COREG) 25 MG tablet TAKE 1 TABLET (25 MG TOTAL) BY MOUTH 2 (TWO) TIMES DAILY. 60 tablet 9  . fluticasone furoate-vilanterol (BREO ELLIPTA) 100-25 MCG/INH AEPB Inhale 1 puff into the lungs daily. 28 each 11  . furosemide (LASIX) 20 MG tablet TAKE 2 TABLETS (40 MG TOTAL) BY MOUTH EVERY MORNING. 180 tablet 2  . glucose blood (ONETOUCH VERIO) test strip 1 each by Other route 2 (two) times daily as needed for other. And lancets 2/day 100 each 12  . rosuvastatin (CRESTOR) 20 MG tablet Take 1 tablet (20 mg total) by mouth daily. 90 tablet 3  . Spacer/Aero-Holding Chambers (AEROCHAMBER MV) inhaler Use as instructed 1 each 0   No current facility-administered medications on file prior to visit.     Allergies  Allergen Reactions  . Lipitor [Atorvastatin] Hives and Itching    Family History  Problem Relation Age of Onset  . Arthritis Mother   . COPD Mother   . Diabetes Mother     was thin, took insulin  . Hypertension Mother   . Hypertension Father   . Arthritis Sister  rheumatoid  . COPD Sister   . Diabetes Brother   . Colon cancer Neg Hx     BP (!) 148/86   Pulse 71   Ht 5\' 10"  (1.778 m)   Wt 183 lb (83 kg)   SpO2 97%   BMI 26.26 kg/m   Review of Systems He denies hypoglycemia.     Objective:   Physical Exam VITAL SIGNS:  See vs page GENERAL: no distress Pulses: dorsalis pedis intact bilat.   MSK: no deformity of the feet CV: no leg edema Skin:  no ulcer on the feet.  normal color and temp on the feet.  Neuro: sensation is intact to touch on the feet.    a1c=8.6%  Lab Results  Component Value Date   CREATININE 2.02 (H) 07/02/2016   BUN 27 (H) 07/02/2016   NA 136 07/02/2016   K 4.7 07/02/2016   CL 102 07/02/2016   CO2 30 07/02/2016      Assessment & Plan:    Insulin-requiring type 2 DM, with renal failure: he needs increased rx HTN: he needs refill.  Patient Instructions  check your blood sugar 4 times a day.  vary the time of day when you check, between before the 3 meals, and at bedtime.  also check if you have symptoms of your blood sugar being too high or too low.  please keep a record of the readings and bring it to your next appointment here.  You can write it on any piece of paper.  please call us sooner if your blood sugar goes below 70, or if you have a lot of readings over 200.  I have sent a prescription to your pharmacy, to increase the lantus to 50 units daily.   I have sent a prescription to your pharmacy, to refill the blood pressure pill, to get you to your next PCP appointment.  Please come back for a follow-up appointment in 2-3 months.

## 2016-09-07 NOTE — Patient Instructions (Addendum)
check your blood sugar 4 times a day.  vary the time of day when you check, between before the 3 meals, and at bedtime.  also check if you have symptoms of your blood sugar being too high or too low.  please keep a record of the readings and bring it to your next appointment here.  You can write it on any piece of paper.  please call us sooner if your blood sugar goes below 70, or if you have a lot of readings over 200.  I have sent a prescription to your pharmacy, to increase the lantus to 50 units daily.   I have sent a prescription to your pharmacy, to refill the blood pressure pill, to get you to your next PCP appointment.  Please come back for a follow-up appointment in 2-3 months.

## 2016-09-13 ENCOUNTER — Other Ambulatory Visit: Payer: Self-pay | Admitting: Student

## 2016-09-13 ENCOUNTER — Other Ambulatory Visit: Payer: Self-pay | Admitting: Radiology

## 2016-09-14 ENCOUNTER — Ambulatory Visit (HOSPITAL_COMMUNITY): Admission: RE | Admit: 2016-09-14 | Payer: BLUE CROSS/BLUE SHIELD | Source: Ambulatory Visit

## 2016-09-21 ENCOUNTER — Other Ambulatory Visit: Payer: Self-pay | Admitting: Pulmonary Disease

## 2016-10-23 IMAGING — CT CT CHEST W/O CM
2 of 4 series · 15 of 36 positions shown, 18 images · non-contrast
Comparison: 05/02/2015 chest radiograph.

CLINICAL DATA: Dyspnea and nonproductive cough.  Chills.

EXAM:
CT CHEST WITHOUT CONTRAST
TECHNIQUE: Multidetector CT imaging of the chest was performed following the
standard protocol without IV contrast.

[Series 2: chest w/o st · axial · non-contrast · 0.74mm/px · z∈[-311,-36]mm · 12 of 65 slices shown, 15 images]
[im 5/65  mediastinal]
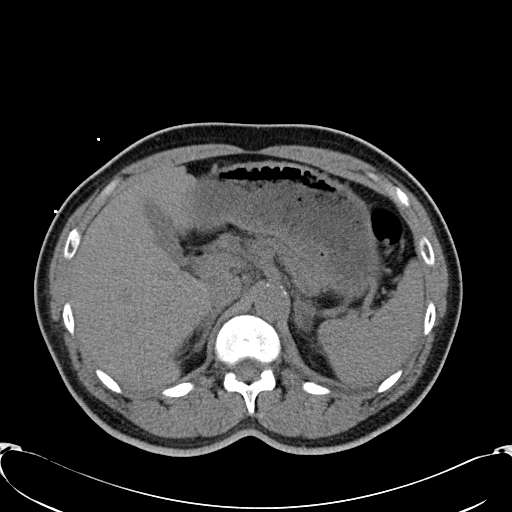
[im 5/65  lung]
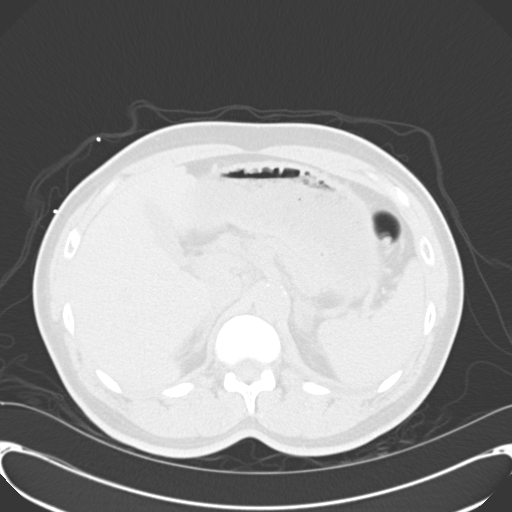
[im 10/65  lung]
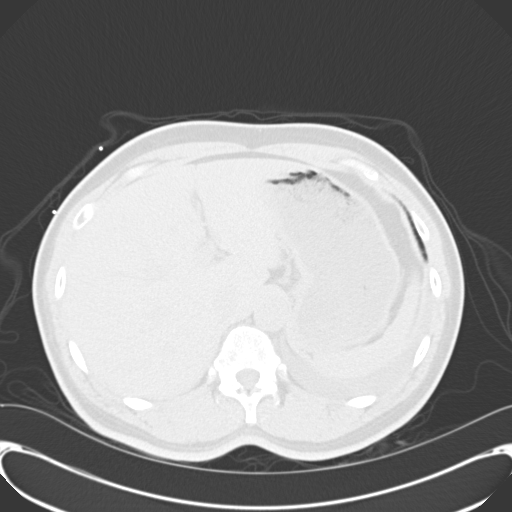
[im 14/65  lung]
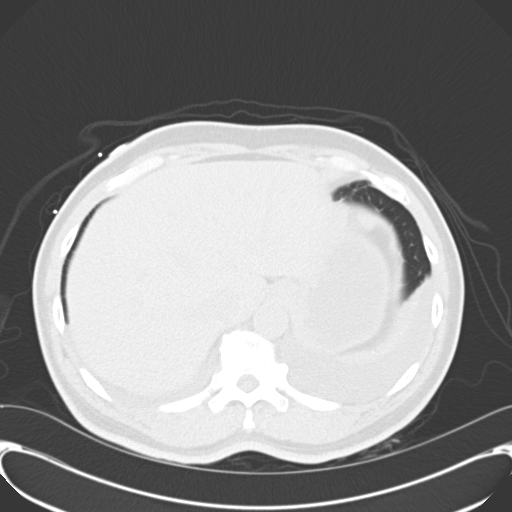
[im 19/65  lung]
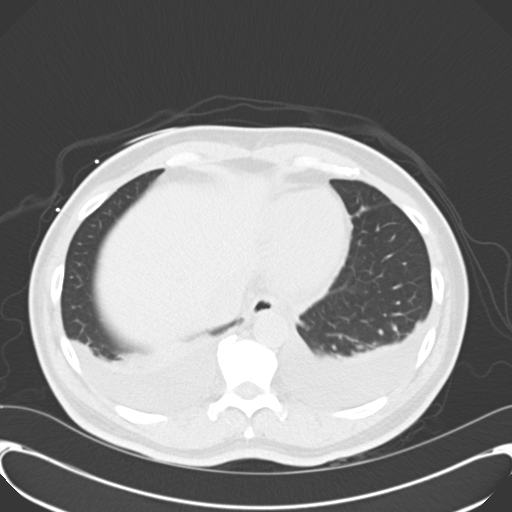
[im 23/65  mediastinal]
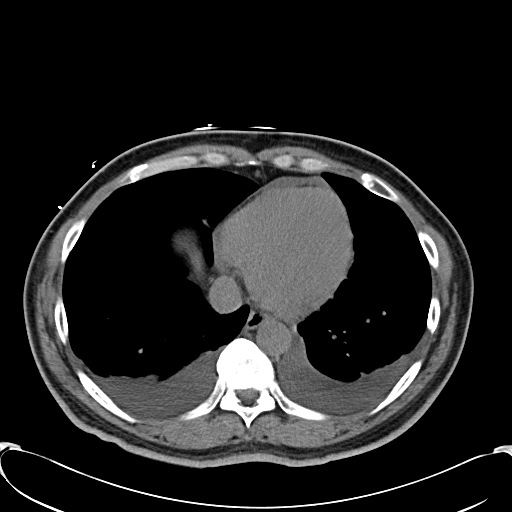
[im 23/65  lung]
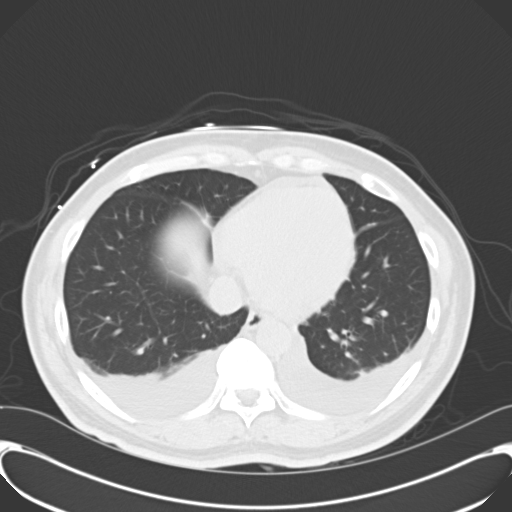
[im 28/65  lung]
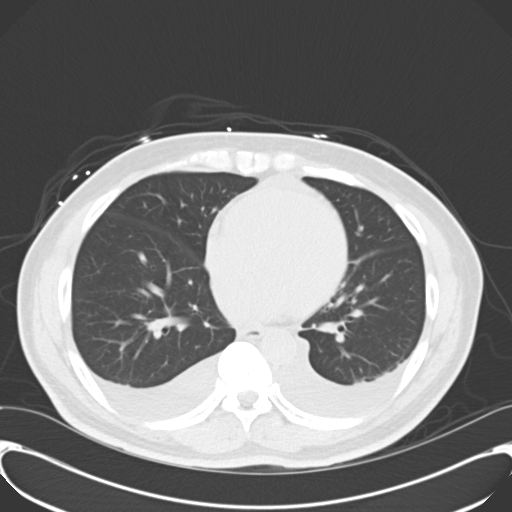
[im 37/65  lung]
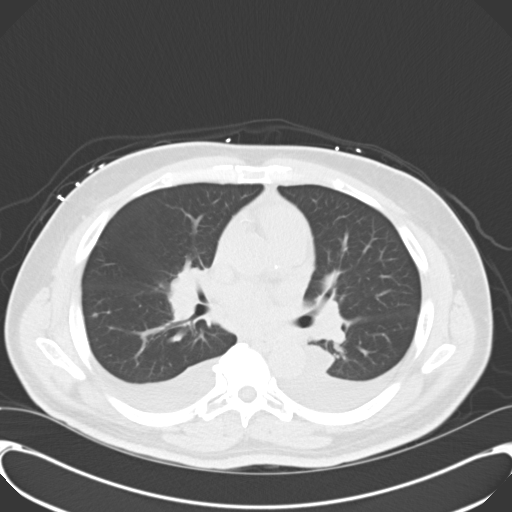
[im 42/65  lung]
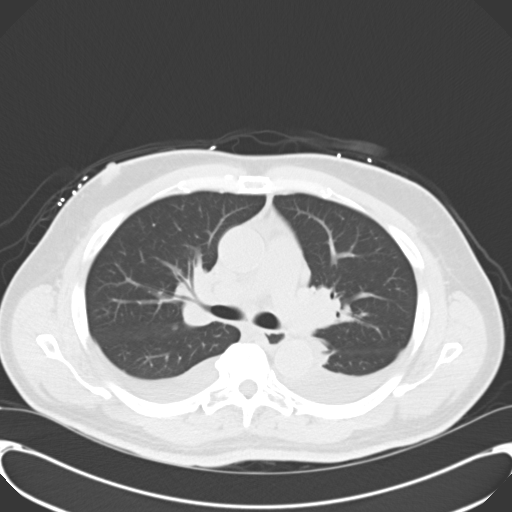
[im 46/65  mediastinal]
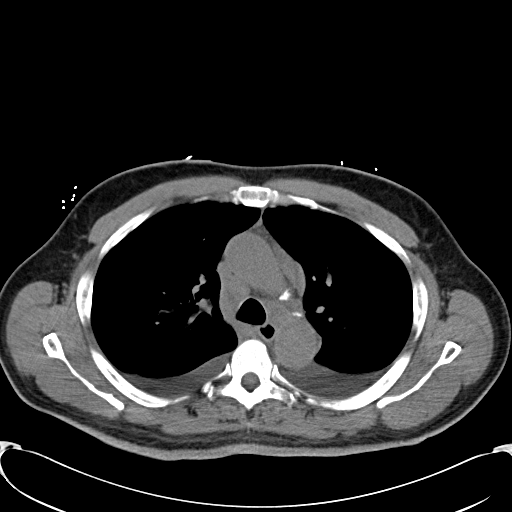
[im 46/65  lung]
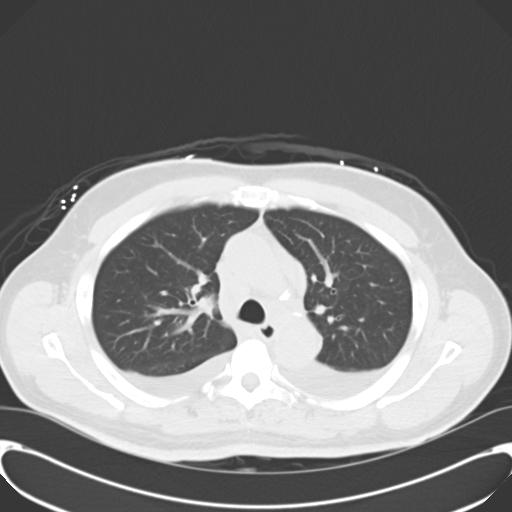
[im 51/65  lung]
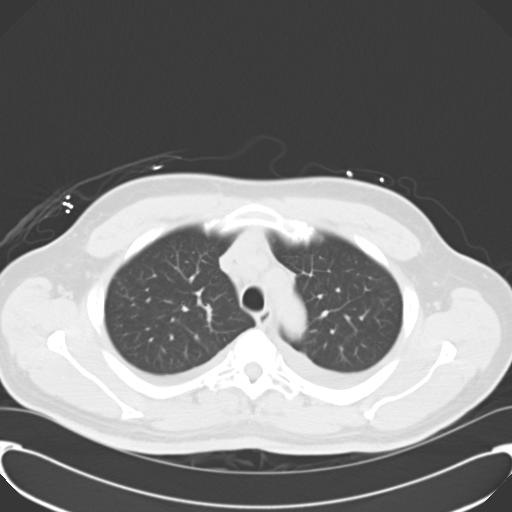
[im 55/65  lung]
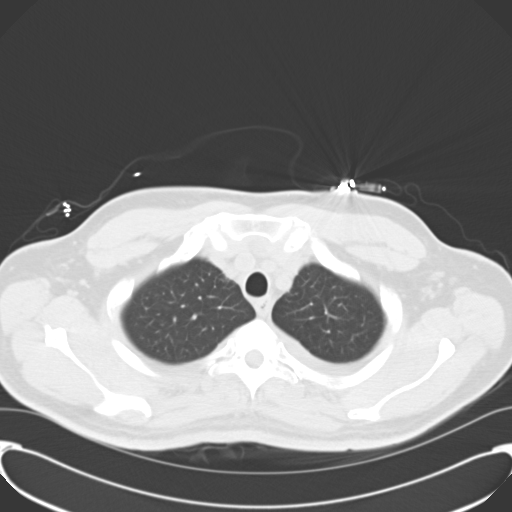
[im 60/65  lung]
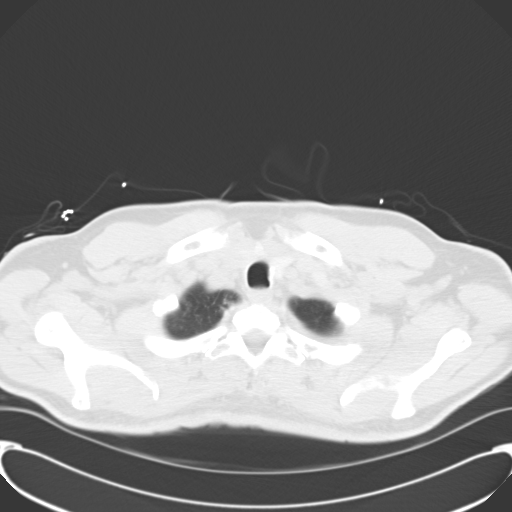

[Series 602: <mpr thick range> · coronal · 0.74mm/px · 3 of 87 slices shown]
[im 18/87  lung]
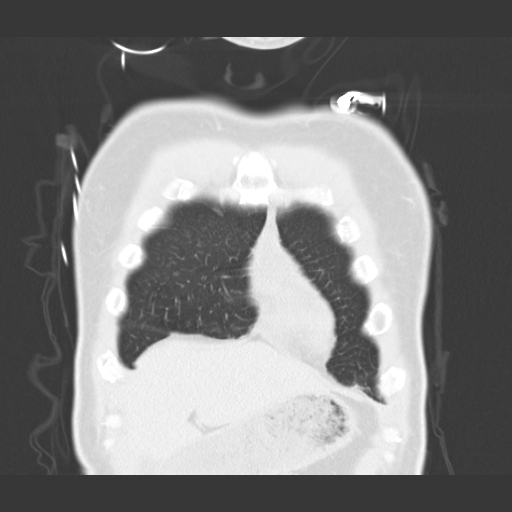
[im 35/87  lung]
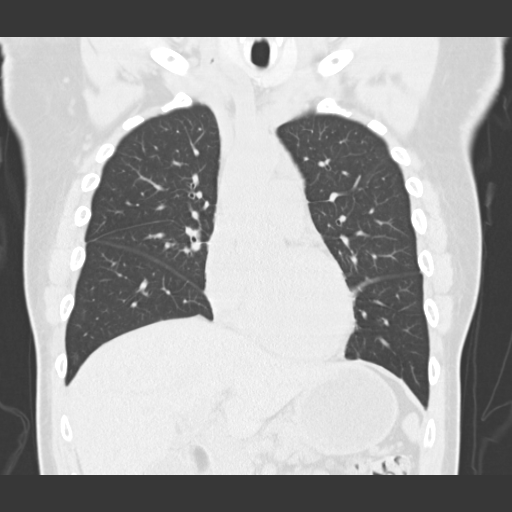
[im 52/87  lung]
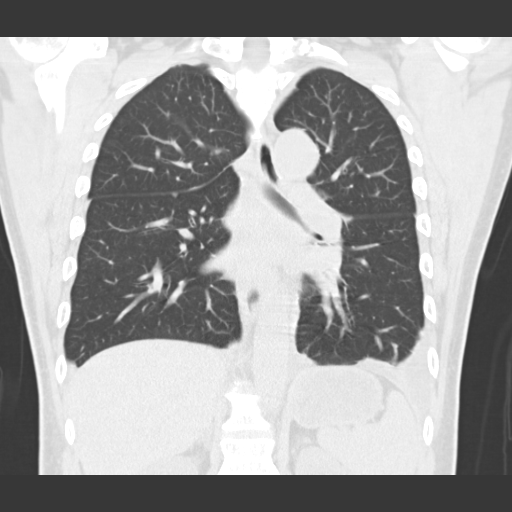

[15 of 36 positions shown; findings below may reference images not displayed]

FINDINGS: Mediastinum/Nodes: Normal heart size. Trace pericardial
fluid/thickening. Left anterior descending and right coronary
atherosclerosis. Great vessels are normal in course and caliber.
Normal visualized thyroid. Normal esophagus. No axillary adenopathy.
Mild right paratracheal mediastinal adenopathy, for example a mildly
enlarged 1.3 cm right paratracheal node (series 2/image 17). Mildly
enlarged 1.3 cm subcarinal node ([DATE]). Mildly enlarged 1.4 cm
aortopulmonary window node ([DATE]). Probable mild bilateral hilar
adenopathy, poorly delineated on this noncontrast study.

Lungs/Pleura: No pneumothorax. Small layering bilateral simple
pleural effusions. There is mild dependent passive atelectasis in
both lower lobes. There is scattered peribronchovascular and
subpleural nodularity throughout both lungs, most prominent in the
right upper lobe and along the major fissures. The dominant
pulmonary nodule measures 6 mm in the right upper lobe (series 5/
image 13). No acute consolidative airspace disease or lung masses.

Upper abdomen: Unremarkable.

Musculoskeletal: No aggressive appearing focal osseous lesions. Mild
degenerative changes in the thoracic spine.
IMPRESSION: 1. Scattered perilymphatic distribution nodularity throughout both
lungs predominantly involving the peribronchovascular upper lobes
and perifissural lungs. Mediastinal and bilateral hilar adenopathy.
This combination of findings is highly suggestive of sarcoidosis.
Pulmonology consultation is advised. A follow-up high-resolution
chest CT study in 3-6 months would be useful to assess for temporal
change, as clinically warranted.
2. Small layering bilateral pleural effusions. Trace pericardial
fluid/thickening.
3. Two-vessel coronary atherosclerosis.

## 2016-12-24 ENCOUNTER — Encounter: Payer: Self-pay | Admitting: Endocrinology

## 2016-12-24 ENCOUNTER — Ambulatory Visit (INDEPENDENT_AMBULATORY_CARE_PROVIDER_SITE_OTHER): Payer: BLUE CROSS/BLUE SHIELD | Admitting: Endocrinology

## 2016-12-24 VITALS — BP 152/96 | HR 77 | Wt 185.8 lb

## 2016-12-24 DIAGNOSIS — E0821 Diabetes mellitus due to underlying condition with diabetic nephropathy: Secondary | ICD-10-CM | POA: Diagnosis not present

## 2016-12-24 DIAGNOSIS — Z794 Long term (current) use of insulin: Secondary | ICD-10-CM

## 2016-12-24 LAB — POCT GLYCOSYLATED HEMOGLOBIN (HGB A1C): Hemoglobin A1C: 13.2

## 2016-12-24 MED ORDER — INSULIN GLARGINE 100 UNIT/ML SOLOSTAR PEN: 50.0000 [IU] | PEN_INJECTOR | SUBCUTANEOUS | 99 refills | Status: DC

## 2016-12-24 NOTE — Progress Notes (Signed)
Subjective:    Patient ID: Brandon Carroll, male    DOB: 1960/11/19, 56 y.o.   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  Dx'ed: 5027 Complications: renal failure. Therapy: insulin since 2014.   DKA: never.  Severe hypoglycemia: never Pancreatitis: never.  Other: He works 1st shift, as a Forensic scientist; he is on QD insulin, due to h/o noncompliance.   Interval history: pt says he recently ran out of insulin.  He says he needed refills, although the prescription has PRN refills.  Denies n/v.  no cbg record, but states cbg's vary from 60-170.  He says the problem is not cost, as he gets free with discount card.   Past Medical History:  Diagnosis Date  . Acute systolic CHF (congestive heart failure), NYHA class 2 ([place]) 05/04/2015  . Acute-on-chronic kidney injury ([place]) 05/04/2015  . Diabetes mellitus   . Erectile dysfunction   . Hyperlipidemia   . Hypertension     Past Surgical History:  Procedure Laterality Date  . CARDIAC CATHETERIZATION N/A 06/15/2015   Procedure: Left Heart Cath and Coronary Angiography;  Surgeon: Peter M Martinique, MD;  Location: [facility];  Service: Cardiovascular;  Laterality: N/A;  . HERNIA REPAIR  7th grade   umbilical    Social History   Social History  . Marital status: Divorced    Spouse name: n/a  . Number of children: 1  . Years of education: 12+   Occupational History  . Security Officer [employer]   Social History Main Topics  . Smoking status: Never Smoker  . Smokeless tobacco: Never Used  . Alcohol use No  . Drug use: No  . Sexual activity: Not Currently     Comment: Erectile dysfunciotn   Other Topics Concern  . Not on file   Social History Narrative   Divorced. Education: [employer].    Unable to exercise due to significant SOB and DOE.    Lives alone.   Son lives in [place].    Current Outpatient Prescriptions on File Prior to  Visit  Medication Sig Dispense Refill  . Albuterol Sulfate (PROAIR RESPICLICK) 741 (90 BASE) MCG/ACT AEPB Inhale 2 puffs into the lungs every 6 (six) hours as needed. 1 each 3  . amLODipine (NORVASC) 5 MG tablet Take 1 tablet (5 mg total) by mouth 2 (two) times daily. 180 tablet 3  . aspirin EC 81 MG tablet Take 81 mg by mouth daily.    . carvedilol (COREG) 25 MG tablet TAKE 1 TABLET (25 MG TOTAL) BY MOUTH 2 (TWO) TIMES DAILY. 60 tablet 9  . fluticasone furoate-vilanterol (BREO ELLIPTA) 100-25 MCG/INH AEPB Inhale 1 puff into the lungs daily. 28 each 11  . furosemide (LASIX) 20 MG tablet TAKE 2 TABLETS (40 MG TOTAL) BY MOUTH EVERY MORNING. 180 tablet 2  . glucose blood (ONETOUCH VERIO) test strip 1 each by Other route 2 (two) times daily as needed for other. And lancets 2/day 100 each 12  . losartan (COZAAR) 100 MG tablet Take 1 tablet (100 mg total) by mouth daily. 90 tablet 0  . rosuvastatin (CRESTOR) 20 MG tablet Take 1 tablet (20 mg total) by mouth daily. 90 tablet 3  . Spacer/Aero-Holding Chambers (AEROCHAMBER MV) inhaler Use as instructed 1 each 0   No current facility-administered medications on file prior to visit.     Allergies  Allergen Reactions  . Lipitor [Atorvastatin] Hives  and Itching    Family History  Problem Relation Age of Onset  . Arthritis Mother   . COPD Mother   . Diabetes Mother        was thin, took insulin  . Hypertension Mother   . Hypertension Father   . Arthritis Sister        rheumatoid  . COPD Sister   . Diabetes Brother   . Colon cancer Neg Hx     BP (!) 152/96   Pulse 77   Wt 185 lb 12.8 oz (84.3 kg)   SpO2 97%   BMI 26.66 kg/m    Review of Systems He denies hypoglycemia.      Objective:   Physical Exam VITAL SIGNS:  See vs page GENERAL: no distress Pulses: foot pulses are intact bilaterally.   MSK: no deformity of the feet or ankles.  CV: no edema of the legs or ankles Skin:  no ulcer on the feet or ankles.  normal color and temp  on the feet and ankles Neuro: sensation is intact to touch on the feet and ankles.   Ext: left great toenail is absent.    Lab Results  Component Value Date   HGBA1C 13.2 12/24/2016   Lab Results  Component Value Date   CREATININE 2.02 (H) 07/02/2016   BUN 27 (H) 07/02/2016   NA 136 07/02/2016   K 4.7 07/02/2016   CL 102 07/02/2016   CO2 30 07/02/2016      Assessment & Plan:  Insulin-requiring type 2 DM, with renal failure: worse.   Noncompliance with cbg recording and insulin.   HTN: with prob situational component.  Please continue the same medications for now.   Patient Instructions  Please continue the same insulin.  I have sent a prescription to your pharmacy, to refill.   if you will soon run out, to request a refill.  check your blood sugar twice a day.  vary the time of day when you check, between before the 3 meals, and at bedtime.  also check if you have symptoms of your blood sugar being too high or too low.  please keep a record of the readings and bring it to your next appointment here (or you can bring the meter itself).  You can write it on any piece of paper.  please call us sooner if your blood sugar goes below 70, or if you have a lot of readings over 200.  Please come back for a follow-up appointment in 2 months.

## 2016-12-24 NOTE — Patient Instructions (Addendum)
Please continue the same insulin.  I have sent a prescription to your pharmacy, to refill.   if you will soon run out, to request a refill.  check your blood sugar twice a day.  vary the time of day when you check, between before the 3 meals, and at bedtime.  also check if you have symptoms of your blood sugar being too high or too low.  please keep a record of the readings and bring it to your next appointment here (or you can bring the meter itself).  You can write it on any piece of paper.  please call us sooner if your blood sugar goes below 70, or if you have a lot of readings over 200.  Please come back for a follow-up appointment in 2 months.

## 2016-12-31 ENCOUNTER — Ambulatory Visit (INDEPENDENT_AMBULATORY_CARE_PROVIDER_SITE_OTHER): Payer: BLUE CROSS/BLUE SHIELD | Admitting: Pulmonary Disease

## 2016-12-31 ENCOUNTER — Other Ambulatory Visit (INDEPENDENT_AMBULATORY_CARE_PROVIDER_SITE_OTHER): Payer: BLUE CROSS/BLUE SHIELD

## 2016-12-31 VITALS — BP 120/80 | HR 70 | Temp 98.1°F | Ht 70.0 in | Wt 188.0 lb

## 2016-12-31 DIAGNOSIS — I5022 Chronic systolic (congestive) heart failure: Secondary | ICD-10-CM | POA: Diagnosis not present

## 2016-12-31 DIAGNOSIS — F419 Anxiety disorder, unspecified: Secondary | ICD-10-CM | POA: Diagnosis not present

## 2016-12-31 DIAGNOSIS — I429 Cardiomyopathy, unspecified: Secondary | ICD-10-CM

## 2016-12-31 DIAGNOSIS — I10 Essential (primary) hypertension: Secondary | ICD-10-CM

## 2016-12-31 DIAGNOSIS — N289 Disorder of kidney and ureter, unspecified: Secondary | ICD-10-CM | POA: Diagnosis not present

## 2016-12-31 DIAGNOSIS — D869 Sarcoidosis, unspecified: Secondary | ICD-10-CM | POA: Diagnosis not present

## 2016-12-31 DIAGNOSIS — E0821 Diabetes mellitus due to underlying condition with diabetic nephropathy: Secondary | ICD-10-CM

## 2016-12-31 DIAGNOSIS — E785 Hyperlipidemia, unspecified: Secondary | ICD-10-CM

## 2016-12-31 LAB — COMPREHENSIVE METABOLIC PANEL
ALT: 16 U/L (ref 0–53)
AST: 27 U/L (ref 0–37)
Albumin: 3.1 g/dL — ABNORMAL LOW (ref 3.5–5.2)
Alkaline Phosphatase: 127 U/L — ABNORMAL HIGH (ref 39–117)
BILIRUBIN TOTAL: 0.3 mg/dL (ref 0.2–1.2)
BUN: 27 mg/dL — ABNORMAL HIGH (ref 6–23)
CALCIUM: 9.6 mg/dL (ref 8.4–10.5)
CHLORIDE: 104 meq/L (ref 96–112)
CO2: 26 meq/L (ref 19–32)
CREATININE: 2.33 mg/dL — AB (ref 0.40–1.50)
GFR: 37.78 mL/min — AB (ref >60.00)
GLUCOSE: 250 mg/dL — AB (ref 70–99)
Potassium: 4.4 mEq/L (ref 3.5–5.1)
Sodium: 137 mEq/L (ref 135–145)
Total Protein: 7.6 g/dL (ref 6.0–8.3)

## 2016-12-31 LAB — CBC WITH DIFFERENTIAL/PLATELET
BASOS PCT: 1.4 % (ref 0.0–3.0)
Basophils Absolute: 0.1 10*3/uL (ref 0.0–0.1)
EOS ABS: 0.2 10*3/uL (ref 0.0–0.7)
EOS PCT: 2.9 % (ref 0.0–5.0)
HCT: 35.9 % — ABNORMAL LOW (ref 39.0–52.0)
Hemoglobin: 11.8 g/dL — ABNORMAL LOW (ref 13.0–17.0)
LYMPHS PCT: 12 % (ref 12.0–46.0)
Lymphs Abs: 1 10*3/uL (ref 0.7–4.0)
MCHC: 32.9 g/dL (ref 30.0–36.0)
MCV: 78.7 fl (ref 78.0–100.0)
Monocytes Absolute: 0.9 10*3/uL (ref 0.1–1.0)
Monocytes Relative: 10.5 % (ref 3.0–12.0)
NEUTROS ABS: 6.2 10*3/uL (ref 1.4–7.7)
Neutrophils Relative %: 73.2 % (ref 43.0–77.0)
PLATELETS: 254 10*3/uL (ref 150.0–400.0)
RBC: 4.56 Mil/uL (ref 4.22–5.81)
RDW: 13.3 % (ref 11.5–15.5)
WBC: 8.4 10*3/uL (ref 4.0–10.5)

## 2016-12-31 LAB — TSH: TSH: 2.45 u[IU]/mL (ref 0.35–4.50)

## 2016-12-31 NOTE — Patient Instructions (Signed)
Today we updated your med list in our EPIC system...    Continue your current medications the same...  Today we checked your follow up blood work & the Sarcoid blood test (ACE level)... We will arrange for a 58yr f/u CT Chest to be done...    We will contact you w/ the results when available...   Be sure to take your BP meds regularly and the LANTUS insulin every day!!!    Work on low carb, no salt diet...  Call for any questions...  Let's plan a follow up visit in 71mo, sooner if needed for breathing problems.Marland KitchenMarland Kitchen

## 2017-01-01 ENCOUNTER — Encounter: Payer: Self-pay | Admitting: Pulmonary Disease

## 2017-01-01 LAB — ANGIOTENSIN CONVERTING ENZYME: Angiotensin-Converting Enzyme: 116 U/L — ABNORMAL HIGH (ref 9–67)

## 2017-01-01 NOTE — Progress Notes (Signed)
Subjective:     Patient ID: Brandon Carroll, male   DOB: 03-Nov-1960, 56 y.o.   MRN: [id number]  HPI ~  June 08, 2015:  Initial pulmonary consult by SN>        43 y/o man referred by Harrison Mons PA, [facility] for a pulmonary evaluation to r/o Sarcoidosis;  He has a history of a cardiomyopathy, chronic systolic CHF w/ YJ=85-63%, HBP, DM, HL, etc;  He was [facility] 12/19 - 05/05/15 by [facility] after presenting w/ dyspnea, PND & 2 pillow orthopnea;  CXR showed bilat effusions, some incr markings and prob LLL atx;  2DEcho showed a cardiomyopathy w/ EF=40-45%;  Labs showed elev BNP=971, mild renal insuffic w/ Cr=1.4-1.6, mild anemia w/ Hg=11-12 and small cells w/ MCV=76;  Pt was diuresed w/ Lasix & lost 7# of fluid w/ improvement in his breathing (also given empiric Levaquin rx) and he was disch for outpt follow up... CT Chest performed 05/04/15 showed scattered bilat pulm nodules (largest=22m in RUL) and mild adenopathy (R paratrach/ subcarinal/ aortopulm window/ & ?bilat hilar regions), norm heart size, coronary atherosclerosis noted, bilat effusions and basilar atx...        For his part the patient states that his SOB has completely resolved (w/ treatment of his CHF);  He notes min cough, small amt of clear sput, no hemoptysis, denies f/c/s, no CP;  He notes some DOE but able to perform his duties at [employer] & down stairs now...   Smoking Hx>  He is a never smoker...  Pulmonary Hx>  He has no prev hx of lung disease- no hx asthma, bronchitis, pneumonia, Tb or exposure.  Medical Hx>  As below-- HBP, CHF/cardiomyopathy (never seen by Cards), DM, HL, RI, anemia...  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  Current Meds>  AlbutHFA prn, Mucinex, Coreg, Calan, Lisinopril, Lasix, Mevacor, Glucotrol EXAM shows Afeb, VSS, O2sat=98% on RA;  Heent- neg, mallampati2;  Chest- clear w/o w/r/r;  Heart- RR  w/o m/r/g;  Abd- soft, nontender, neg;  Ext- neg w/o c/c/e;  Neuro- intact w/o focal deficits...  CXR 05/02/15 showed bilat effusions, some incr markings and prob LLL atx  EKG 05/02/15 showed NSR, rate73/min, +STTWA- consider inferolat ischemia...  2DEcho 05/03/15 showed mild LVH, mod reduced LVF w/ EF=40-45% & diffuse HK; MV & AoV are wnl, LA is mod dilated, RA is mildly dilated  Labs in Epic from 04/2015 showed elev BNP=971, mild renal insuffic w/ Cr=1.4-1.6, mild anemia w/ Hg=11-12 and small cells w/ MCV=76   CT Chest performed 05/04/15 showed scattered bilat pulm nodules (largest=690min RUL) and mild adenopathy (R paratrach/ subcarinal/ aortopulm window/ & ?bilat hilar regions), norm heart size, coronary atherosclerosis noted, bilat effusions and basilar atx...    Spirometry 06/08/15 showed FVC=3.36 (81%), FEV1=2.62 (79%), %1sec=78, mid-flows are sl reduced at 61% predicted; This is c/w very mild airflow obstruction esp in small airways, can't r/o superimposed restriction w/o LV measurement.   Ambulatory Oximetry 06/08/15>  O2sat=100% on RA at rest w/ pulse=66/min; He ambulated 3 laps w/o difficulty, lowest O2sat=99% w/ pulse=74/min...  IMP >>    1) Abn CXR & CTChest w/ scattered bilat pulm nodules (largest=10m65mn RUL) and mild adenopathy seen (R paratrach/ subcarinal/ aortopulm window/ & ?bilat hilar regions)> the Adm CXR 04/2015 appeared to me to be c/w CHF w/ bilat effusions, interstitial edema, and some LLL atx (clinical picture  was not that of pneumonia w/o fever/ purulent sput, normal WBC ct, neg pneumococcal & legionella Ag, etc); radiology wondered about sarcoidosis, pt was on Lisinopril at time of Adm & therefore no ACE level checked => we switched Lisinopril to LOSARTAN100 today...     2) HBP> controlled on Coreg 12.5- 1/2Bid, Calan120Bid, Lisinopril40, Lasix20-2Bid => we switched the ACE to ARB as above...    3) Cardiomyopathy w/ chronic systolic CHF and IY=64-15% by 2DEcho> BNP in  hosp was 971 & he diuresed 7+lbs of fluid w/ improvement in his breathing; he has not seen Cards & they didn't call consult in [place]; CTChest alluded to calcif in coronaries and he needs Cards consult for Cath and on-going treatment for his CHF.Marland KitchenMarland Kitchen     4) Hyperlipidemia> on Mevacor20 per PCP.Marland KitchenMarland Kitchen    5) Diabetes Mellitus> he has seen DrEllison in the past, A1c=7.0 04/2015 Hosp, urinalysis was pos for proteinuria; Prev on LANTUS & Metformin, now just on Glucotrol?    6) Renal insuffic> baseline Cr appears to be ~1.2-1.3, Cr in [place] was 1.4-1.6 w/ diuresis, then back to 1.3    7) Elev Alk Phos> this fractionated to liver but GOT/GPT are wnl    8) Anemia w/ small cells> Hg in Hosp was 11.9 w/ MCV=76; baseline in Epic ~12-13 w/o Iron studies or anemia eval previously done...  PLAN >>     Brandon Carroll appears to have a major heart problem and needs to see Cardiology 1st- we will refer ASAP so they can manage his cardiomyopathy & eval for poss underlying CAD;  From the pulmonary perspective he is a never smoker & has no prior hx of any lung problem;  He has an abn CXR & CT Chest as noted, only mild PFT abnormality & I am not yet ready to commit him to a lung Bx or mediastinoscopy & bx to cement the dx of sarcoidosis;  For now we will switch his Lisinopril to Losartan (so we can check ACE level later) and we will plan ROV in 2-13mow/ CXR at that time (he will need f/u CT at a later date).  ~  August 11, 2015:  220moOV & Brandon Carroll saw CARDS, DrRandolph & her notes are reviewed> Hypertensive heart dis, chr sys & diastolic CHF, mult coronary risk factors;  They adjusted his BP meds & did a heart CATH 06/15/15 showing trivial CAD w/ 10% LAD & RCA, and elev LV filling pressures;  DOE w/ min exertion persisted and his Lasix was adjusted w/ improvement;  Off the ACE inhibitor his ACE level was checked and ret at 41 (8-52) confirming outr opinion that he does not have active sarcoidosis;  He was placed on Breo daily & Albut HFA for  prn use- ?any benefit... We reviewed the following medical problems during today's office visit >>     Abn CXR & CTChest w/ scattered bilat pulm nodules (largest=33m3mn RUL) and mild adenopathy seen (R paratrach/ subcarinal/ aortopulm window/ & ?bilat hilar regions)> the Adm CXR appeared to me to be c/w CHF w/ bilat effusions, interstitial edema, and some LLL atx (clinical picture was not that of pneumonia w/o fever/ purulent sput, normal WBC ct, neg pneumococcal & legionella Ag, etc); radiology wondered about sarcoidosis, pt was on Lisinopril at time of Adm & therefore no ACE level checked => we switched Lisinopril to LOSARTAN100 & checked ACE level 07/05/15=> 41 (norm8-52) confirming our clinical impression of no active sarcoid... He wqas placed on BREO & ALbutHFA rescue  inhaler in the interim as a trial- no real change noted (Spirometry 05/2015 was OK x mild small airways dis).    HBP> better controlled on Coreg 25Bid, Amlod5, Losar100, Lasix40/d;  BP= 146/94 and he says breathing is improved, feeling better, no CP/ palpit/ edema...    Cardiomyopathy w/ chronic systolic CHF and FB=51-02% by 2DEcho> BNP in hosp was 971 & he diuresed 7+lbs of fluid w/ improvement in his breathing; CTChest alluded to calcif in coronaries and he was seen by Ivar Drape- referred for CATH 06/10/15 showing triv CAD (10% LAD & RCA) and elev LV filling pressures c/w a non-ischemic cardiomyopathy;  He is improved w/ better BP control & CHF rx per Cards...    Hyperlipidemia> prev on Mevacor20 w/ FLP 07/26/15 showing TChol 254, TG 109, HDL 74, LDL 158; DrRandolph changed Mevacor20 to [place]...    Diabetes Mellitus> he has seen DrEllison in the past, A1c=7.0 04/2015 Hosp, urinalysis was pos for proteinuria; Prev on LANTUS & Metformin, now just on Glucotrol10Bid, BS=150-160 w/ A1c=7.4 (06/21/15)    Renal insuffic> baseline Cr appears to be ~1.2-1.3, Cr in [place] was 1.4-1.6 w/ diuresis ^ improvement in CHF...    Elev Alk Phos> this  fractionated to liver but GOT/GPT are wnl; PCP did Abd Sonar 07/08/15 showing 3.6 cm GB polyp, no stones, ducts ok & liver ok...    Anemia w/ small cells> Hg in Hosp was 11.9 w/ MCV=76; baseline in Epic ~12-13; repeat labs here 08/11/15=> Hb=13.1, MCV=77, Fe=89 (30%sat), Ferritin=60, B12=489, Folate=5.0 & rec OTC MVI + Folic Acid...    Rash>  He saw [facility] who felt rash was cutaneous fungal infection, not sarcoid, KOH was pos & rash resolved w/ Fluconazole & topical cream... EXAM shows Afeb, VSS (BP=146/94), O2sat=99% on RA;  Heent- neg, mallampati2;  Chest- clear w/o w/r/r;  Heart- RR w/o m/r/g;  Abd- soft, nontender, neg;  Ext- neg w/o c/c/e;  Neuro- intact w/o focal deficits...  LABS 08/11/15>  [place]- BS=318, Cr=1.62;  Hb=13.1, MCV=77, Fe=89 (30%sat), Ferritin=60, B12=489, Folate=5.0 & rec OTC MVI + Folic Acid... IMP/PLAN >>  His breathing is much improved on Lasix diuresis & adjustment in BP/ CHF meds- doubt much benefit from [facility];  F/u labs today show BS=318 on his Glucotrol alone- needs f/u DrEllison for Endocrine;  Anemia labs show Hg back to norm at 13 but cells still small & HAVE ALWAYS BEEN SMALL suggesting prob thallassemia minor, but Folic level sl low so we will rec MVI + OTC Folate... Plan is to see him back in June- time for CXR & f/u CT Chest to check the nodules seen 04/2015...   ~  December 29, 2015:  58moROV & JSelvinis doing reasonably well from the pulm standpoint- notes sl dry cough, OTC Delsym helps, no SOB/ CP/ palpit/ sput/ hemoptysis/ f/c/s etc;  He is exercising at a gym on the treadmill he says;; Meds costs are a real issue for him- he ran out of Breo & couldn't afford refill;  [facility] >300 w/ A1c=11.3 & they added FWilder Glade(which he could not afford to refill)... We reviewed the following medical problems during today's office visit >>     Abn CXR & CTChest, Elev ACE level=> c/w SARCOIDOSIS>   ~  04/2015 Hosp>  CTChest  04/2015 showed scattered bilat pulm nodules (largest=67min RUL) and mild adenopathy (R paratrach/ subcarinal/ aortopulm window/ & ?bilat hilar regions); CXR was c/w  CHF w/ bilat effusions, interstitial edema, and some LLL atx (clinical picture was not that of pneumonia w/o fever/ purulent sput, normal WBC ct, neg pneumococcal & legionella Ag, etc); radiology wondered about sarcoidosis, pt was on Lisinopril at that time & therefore no ACE level checked =>  ~  06/08/15 consult>  we switched Lisinopril to [id number] & checked ACE level 07/05/15=> 41 (norm8-52) suggesting no active sarcoid...  ~  07/2015>  He was placed on BREO & ALbutHFA rescue inhaler in the interim as a trial- no real change noted & too $$ (Spirometry 05/2015 was OK x mild small airways dis)...  ~  12/29/15>  Due for f/u CT Chest (+bilat patchy GGO & nodularity w/ adenopathy c/w sarcoid), and ACE level (elev >100 at this time)=> all c/w active sarcoid at this time, consider options for treatment w/ or w/o lung bx...    HBP> controlled on Coreg 25Bid, Amlod5Bid, Losartan100, Lasix40/d;  BP= 136/82 and he says breathing is at baseline, feeling well- no CP/ palpit/ edema...    Nonischemic Cardiomyopathy w/ chronic systolic CHF and IB=70-48% by 2DEcho> BNP in hosp 04/2015 was 971 & he diuresed 7+lbs of fluid w/ improvement in his breathing; CTChest alluded to calcif in coronaries and he was seen by Ivar Drape 05/2015- CATH showing triv CAD (10% LAD & RCA) and elev LV filling pressures c/w a non-ischemic cardiomyopathy;  He is improved w/ better BP control & CHF rx per Cards...    Hyperlipidemia> prev on Mevacor20 w/ FLP 07/26/15 showing TChol 254, TG 109, HDL 74, LDL 158; DrRandolph changed Mevacor20 to [place] & now on Crestor20...    Diabetes Mellitus> seen DrEllison 11/2014 & placed on Lantus/ Humalog; pt couldn't afford & never followed up, 04/2015 Hosp- A1c=7.0 & disch on Glucotrol 10Bid w/ BS=150-160 w/ A1c=7.4 (06/21/15); but since then BS>300 &  A1c>11.0 and he desperately needs Endocrine management of DM w/ need for Prednisone Rx of his Sarcoid!    Renal insuffic> baseline Cr appears to be ~1.2-1.3, Cr in [place] was 1.4-1.6 w/ diuresis & improvement in CHF; most recent Cr= 1.6-1.8 range...    Elev Alk Phos> this fractionated to liver but GOT/GPT are wnl; PCP did Abd Sonar 07/08/15 showing 3.6 cm GB polyp, no stones, ducts ok & liver ok...    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    Rash>  He saw [facility] who felt rash was cutaneous fungal infection, not sarcoid, KOH was pos & rash resolved w/ Fluconazole & topical cream... EXAM shows Afeb, VSS, O2sat=97% on RA;  Heent- neg, mallampati2;  Chest- clear w/o w/r/r;  Heart- RR w/o m/r/g;  Abd- soft, nontender, neg;  Ext- neg w/o c/c/e;  Neuro- intact w/o focal deficits...  CXR 12/29/15>  Norm heart size & vascularity, clear lungs, no effusions, sl fullness in hilum, NAD...  LABS 12/29/15>  [place]- BS=348, A1c=11.0, BUN=27. Cr=1.78;  Sed=63;  ACE>100...  CT Chest done 01/03/16>  Norm heart size, Ao & coronary atherosclerosis, norm caliber PAs, mildly enlarged mediastinal & hilar nodes, scat bilat mid & upper lobe nodularity w/ dominant 11m RUL nodule unchanged, mild patchy GGO in bilat upper lobes w/ dominant LUL 1.2cm nodule which is new => all felt to be c/w sarcoid. IMP/PLAN>>  Brandon Carroll has a pattern of pulm abnormalities that are concordant w/ the diagnosis of active  sarcoidosis at this time; we do not have a biopsy for confirmation but he would like to avoid lung bx if poss & I indicated that I would be willing to treat him for this condition & follow his response very closely so long as he was also under the care of a diabetic specialist at the same time;  His DM is poorly controlled & he will need active  management of this condition w/ meds he can afford to fill (ie- prob NPH & Reg inulin or mixed 70/30 or similar) while we place him on Pred & taper it as rapidly as possible... I would like him to see the diabetologist before we consider starting the Pred rx...   ~  July 02, 2016:  94moROV & Brandon Carroll established w/ DrEllison for DM management, unfortunately his control remains poor- a combination of his severe disease and poor compliance, now w/ slowly progressive renal insuffic & needs to establish w/ Nephrology; fortunately his presumed sarcoid is stable w/o signs of progressive pulmonary or reticuloendothelial disease...  ~  Since he was here last he has seen DrEllison twice- 1st was 02/08/16 & 2nd 06/18/16> on LANTUS 10=> incr to 25u recently w/ LABS showing A1c=9.6..Marland Kitchen  ~  His PCP is listed as [facility] [place]- [place] but he hasn't been seen in some time (last 5/17)...    HBP> controlled on Coreg 25Bid, Amlod5Bid, Losartan100, Lasix40/d;  BP= 136/82 and he says breathing is at baseline, feeling well- no CP/ palpit/ edema...    Nonischemic Cardiomyopathy w/ chronic systolic CHF and EIH=03-88%by 2DEcho> BNP in hosp 04/2015 was 971 & he diuresed 7+lbs of fluid w/ improvement in his breathing; CTChest alluded to calcif in coronaries and he was seen by DIvar Drape1/2017- CATH showing triv CAD (10% LAD & RCA) and elev LV filling pressures c/w a non-ischemic cardiomyopathy;  He is improved w/ better BP control & CHF rx per Cards...    Hyperlipidemia> prev on Mevacor20 w/ FLP 07/26/15 showing TChol 254, TG 109, HDL 74, LDL 158; DrRandolph changed Mevacor20 to [place]& now on Crestor20...    Diabetes Mellitus> seen DrEllison 11/2014 & placed on Lantus/ Humalog; pt couldn't afford & never followed up, 04/2015 Hosp- A1c=7.0 & disch on Glucotrol 10Bid w/ BS=150-160 w/ A1c=7.4 (06/21/15); but since then BS>300 & A1c>11.0 and he desperately needs Endocrine management of DM w/ need for Prednisone Rx of his  Sarcoid!    Renal insuffic> baseline Cr appears to be ~1.2-1.3, [place] 1.4[place]1.6 w/ diuresis & improvement in CHF; most recent Cr= 1.6-1.8 range...    Elev Alk Phos> this fractionated to liver but GOT/GPT are wnl; PCP did Abd Sonar 07/08/15 showing 3.6 cm GB polyp, no stones, ducts ok & liver ok...    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    Rash>  He saw [facility] who felt rash was cutaneous fungal infection, not sarcoid, KOH was pos & rash resolved w/ Fluconazole & topical cream...    Poor compliance w/ medications> I called [facility] [employer] on [address] (he gets is ral meds here & no Breo since 8/17, last filled Coreg&Losar 11/17 (188mo Lasix 11/17 (49m536moAmlod1/18 (49mo30moe [facility] insulin at [facility] [employer] Lantus 10u/d-49mo 4moly 02/13/16, then new rx for 25u/d filled 06/23/16 (36mo s20moy)...Marland KitchenMarland Kitchen  EXAM shows Afeb, VSS w/ BP=150/90 despite poor med compliance, O2sat=100% on RA;  Heent- neg, mallampati2;  Chest- clear w/o w/r/r;  Heart- RR w/o m/r/g;  Abd- soft, nontender, neg;  Ext- neg w/o c/c/e;  Neuro- intact.   CXR 07/02/16>  CXR is stable/ unchanged; norm heart size, clear lungs, NAD- no change in lymph nodes...  LABS 07/02/16>  [place]- BS=261, A1c=10.5 despite recent incr to 25u Lantus;  BUN=27, Cr=2.02 (ClCr=45) sl worse on his Lasix40;  LFTs are ok & AlkPhos is better- down to 150;  ACE=118 same as prev. IMP/PLAN>>  Brandon Carroll has some serious medical problems- HBP, Cardiomyopathy w/ chr SysCHF, IDDM, renal insuffic;  As noted he also appears to have sarcoidosis w/ mild adenopathy & scattered nodules, elev AlkPhos, elev ACE- but these appear stable & non-progressive, we continue to follow carefully, but his other medical issues (BP, heart, DM, now renal) are much more serious and slowly  progressive... I maintain that he needs aggressive DM management to get control of his BS, PCP & cardiac management of his BP & cardiomyoopathy, and now renal referral for there eval in light of his slowly worsening creat...    ~  December 31, 2016:  81moROV & pulmonary follow up visit for Hx Sarcoidosis>  Supposed to be on Breo one inhalation daily but too $$, has Albut rescue inhaler but hasn't needed... See prob list above...     He saw Nephrology- Dr.Eliz UHollie Salk3/27/18>  HBP, HL, DM (poor control), sarcoid, nonischemic cardiomyopathy w/ chr sysCHF, and Cr~2.0; DrUpton felt that he needed a renal Bx & he was sched for this mult times and no showed- labs showed NEG- ANA/ ANCA/ Complement- C3&C4/ HepB/ HIV/ HepC/ RPR/ SPEP, sl incr both kappa & lambda light chains in urine    He saw DrEllison- DM/ Endocrine 12/24/16>  DM since 1996, insulin since 2014; on INSULIN but note doesn't say what type/ how much/ supposed to be on Lantus50- A1c=13.2 & dose kept the same per note...    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 EXAM shows Afeb, VSS w/ BP=120/80 despite poor med compliance, O2sat=98% on RA;  Heent- neg, mallampati2;  Chest- clear w/o w/r/r;  Heart- RR w/o m/r/g;  Abd- soft, nontender, neg;  Ext- neg w/o c/c/e;  Neuro- intact.   CT Chest 01/02/17> norm heart size, aortic calcif; +mediastinal adenopathy- similar or minimally larger on this study; similar nodular areas of GGO in both apicies (dominant 1.2cm isunchanged), stable 682mRUL nodule, other scattered tiny bilat nodules are similar, no edema/ effusion => NO SUBSTANTIAL INTERVAL CHANGE...  LABS 12/31/16>  [place]- BS=250, Cr=2.33, LFTs=wnl;  CBC- Hg=11.8, mcv=79;  TSH=2.45;  ACE=116 IMP/PLAN>>  Prob sarcoid w/ mediastinal adenopathy & abn CT  Chest, never been biopsied, elev ACE level as noted, not on Pred therapy due to poorly controlled DM & no hard indication for med rx;  HBP, cardiomyopathy on 4 meds- stable, hasn't seen Cards in some time;  DM insulin dependent, poorly compliant & poorly controlled;  Renal insuffic- slowly progressive, pt has not had renal bx planned by DrEstill Dooms [facility]. He needs active involvement from all his physician team members...     Past Medical History:  Diagnosis Date  . Acute systolic  CHF (congestive heart failure), NYHA class 2 ([place]) 05/04/2015  . Acute-on-chronic kidney injury ([place]) 05/04/2015  . Diabetes mellitus   . Erectile dysfunction   . Hyperlipidemia   . Hypertension     Past Surgical History:  Procedure Laterality Date  . CARDIAC CATHETERIZATION N/A 06/15/2015   Procedure: Left Heart Cath and Coronary Angiography;  Surgeon: Peter M Martinique, MD;  Location: [facility];  Service: Cardiovascular;  Laterality: N/A;  . HERNIA REPAIR  7th grade   umbilical    Outpatient Encounter Prescriptions as of 12/31/2016  Medication Sig  . Albuterol Sulfate (PROAIR RESPICLICK) 294 (90 BASE) MCG/ACT AEPB Inhale 2 puffs into the lungs every 6 (six) hours as needed.  Marland Kitchen amLODipine (NORVASC) 5 MG tablet Take 1 tablet (5 mg total) by mouth 2 (two) times daily.  Marland Kitchen aspirin EC 81 MG tablet Take 81 mg by mouth daily.  . carvedilol (COREG) 25 MG tablet TAKE 1 TABLET (25 MG TOTAL) BY MOUTH 2 (TWO) TIMES DAILY.  . fluticasone furoate-vilanterol (BREO ELLIPTA) 100-25 MCG/INH AEPB Inhale 1 puff into the lungs daily.  . furosemide (LASIX) 20 MG tablet TAKE 2 TABLETS (40 MG TOTAL) BY MOUTH EVERY MORNING.  Marland Kitchen glucose blood (ONETOUCH VERIO) test strip 1 each by Other route 2 (two) times daily as needed for other. And lancets 2/day  . Insulin Glargine (LANTUS SOLOSTAR) 100 UNIT/ML Solostar Pen Inject 50 Units into the skin every morning. And pen needles 1/day  . losartan (COZAAR) 100 MG tablet Take 1  tablet (100 mg total) by mouth daily.  . rosuvastatin (CRESTOR) 20 MG tablet Take 1 tablet (20 mg total) by mouth daily.  Marland Kitchen Spacer/Aero-Holding Chambers (AEROCHAMBER MV) inhaler Use as instructed   No facility-administered encounter medications on file as of 12/31/2016.     Allergies  Allergen Reactions  . Lipitor [Atorvastatin] Hives and Itching    Immunization History  Administered Date(s) Administered  . Influenza, Seasonal, Injecte, Preservative Fre 07/22/2012  . Influenza,inj,Quad PF,36+ Mos 08/04/2014, 07/02/2016  . Pneumococcal Polysaccharide-23 07/22/2012, 05/03/2015  . Tdap 05/14/2010    Current Medications, Allergies, Past Medical History, Past Surgical History, Family History, and Social History were reviewed in [employer] record.   Review of Systems             All symptoms NEG except where BOLDED >>  Constitutional:  F/C/S, fatigue, anorexia, unexpected weight change. HEENT:  HA, visual changes, hearing loss, earache, nasal symptoms, sore throat, mouth sores, hoarseness. Resp:  cough, sputum, hemoptysis; SOB, tightness, wheezing. Cardio:  CP, palpit, DOE, orthopnea, edema. GI:  N/V/D/C, blood in stool; reflux, abd pain, distention, gas. GU:  dysuria, freq, urgency, hematuria, flank pain, voiding difficulty. MS:  joint pain, swelling, tenderness, decr ROM; neck pain, back pain, etc. Neuro:  HA, tremors, seizures, dizziness, syncope, weakness, numbness, gait abn. Skin:  suspicious lesions or skin rash. Heme:  adenopathy, bruising, bleeding. Psyche:  confusion, agitation, sleep disturbance, hallucinations, anxiety, depression suicidal.   Objective:   Physical Exam       Vital Signs:  Reviewed...   General:  WD, WN, 56 y/o BM in NAD; alert & oriented; pleasant & cooperative... HEENT:  [place]/AT; Conjunctiva- pink, Sclera- nonicteric, EOM-wnl, PERRLA, Fundi-benign; EACs-clear, TMs-wnl; NOSE-clear; THROAT-clear & wnl.  Neck:  Supple w/ full ROM;  no JVD; normal carotid impulses w/o bruits; no thyromegaly or nodules palpated; no lymphadenopathy.  Chest:  Clear to P & A; without wheezes, rales, or rhonchi heard. Heart:  Regular Rhythm;  norm S1 & S2 without murmurs, rubs, or gallops detected. Abdomen:  Soft & nontender- no guarding or rebound; normal bowel sounds; no organomegaly or masses palpated. Ext:  Normal ROM; without deformities or arthritic changes; no varicose veins, venous insuffic, or edema;  Pulses intact w/o bruits. Neuro:  CNs II-XII intact; motor testing normal; sensory testing normal; gait normal & balance OK. Derm:  No lesions noted; no rash etc. Lymph:  No cervical, supraclavicular, axillary, or inguinal adenopathy palpated.   Assessment:      IMP >>    Abn CXR & CTChest, Elev ACE level=> c/w SARCOIDOSIS>  He would like to avoid lung bx if poss & I indicated that I would be willing to treat him w/o based on the strength of the current evidence (CT results + ACE level), but this will require him to under active DM management by an Endocrinologist before Prednisone is started...    HBP> controlled on Rx, continue same...    Nonischemic Cardiomyopathy w/ chronic systolic CHF and YW=73-71% by 2DEcho> managed by Cards, DrRandolph- continue her meds...    Hyperlipidemia> he lists Cres20 currently, he will need f/u FLP by his PCP later...    Diabetes Mellitus> poorly controlled & we reviewed DM diet & need for active Diabetologist involvement in his care, esp in light of need for Pred rx for the sarcoid...     Elev Alk Phos> this fractionated to liver but GOT/GPT are wnl; PCP did Abd Sonar 07/08/15 showing 3.6 cm GB polyp, no stones, ducts ok & liver ok...    Progressive renal insuffic>  Seen by Nephrology, Estill Dooms who planned renal bx but pt hasn't pursued this yet...    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    Rash>  He saw Derm w/ +KOH & improved w/ their rx...  PLAN >>  06/08/15>  Brandon Carroll  appears to have a major heart problem and needs to see Cardiology 1st- we will refer ASAP so they can manage his cardiomyopathy & eval for poss underlying CAD;  From the pulmonary perspective he is a never smoker & has no prior hx of any lung problem;  He has an abn CXR & CT Chest as noted, only mild PFT abnormality & I am not yet ready to commit him to a lung Bx or mediastinoscopy & bx to cement the dx of sarcoidosis;  For now we will switch his Lisinopril to Losartan (so we can check ACE level later) and we will plan ROV in 2-66mow/ CXR at that time (he will need f/u CT at a later date) 08/11/15>  His breathing is much improved on Lasix diuresis & adjustment in BP/ CHF meds- [facility]  F/u labs today show BS=318 on his Glucotrol alone- needs f/u DrEllison for Endocrine;  Anemia labs show Hg back to norm at 13 but cells still small & HAVE ALWAYS BEEN SMALL suggesting prob thallassemia minor, but Folic level sl low so we will rec MVI + OTC Folate... Plan is to see him back in June- time for CXR & f/u CT Chest to check the nodules seen 04/2015...  12/29/15>   Brandon Carroll has a pattern of pulm abnormalities that are concordant w/ the diagnosis of active sarcoidosis at this time; we do not have a biopsy for confirmation but he would like to avoid lung bx if poss & I indicated that I would be willing to treat him for this condition & follow his response very closely so  long as he was also under the care of a diabetic specialist at the same time;  His DM is poorly controlled 7 he will need active management of this condition w/ meds he can afford to fill (ie- prob NPH & Reg inulin or mixed 70/30 or similar) while we place him on Pred & taper it as rapidly as possible... I would like him to see the diabetologist before we start the Pred rx...  07/02/16>    Brandon Carroll has some serious medical problems- HBP, Cardiomyopathy w/ chr SysCHF, IDDM, renal insuffic;  As noted he also appears to have sarcoidosis w/  mild adenopathy & scattered nodules, elev AlkPhos, elev ACE- but these appear stable & non-progressive, we continue to follow carefully, but his other medical issues (BP, heart, DM, now renal) are much more serious and slowly progressive... I maintain that he needs aggressive DM management to get control of his BS, PCP & cardiac management of his BP & cardiomyoopathy, and now renal referral for there eval in light of his slowly worsening creat...  12/31/16>   Prob sarcoid w/ mediastinal adenopathy & abn CT Chest, never been biopsied, elev ACE level as noted, not on Pred therapy due to poorly controlled DM & no hard indication for med rx;  HBP, cardiomyopathy on 4 meds- stable, hasn't seen Cards in some time;  DM insulin dependent, poorly compliant & poorly controlled;  Renal insuffic- slowly progressive, pt has not had renal bx planned by Estill Dooms;  PCP- [facility]... He needs active involvement from all his physician team members.     Plan:     Patient's Medications  New Prescriptions   No medications on file  Previous Medications   ALBUTEROL SULFATE (PROAIR RESPICLICK) 382 (90 BASE) MCG/ACT AEPB    Inhale 2 puffs into the lungs every 6 (six) hours as needed.   AMLODIPINE (NORVASC) 5 MG TABLET    Take 1 tablet (5 mg total) by mouth 2 (two) times daily.   ASPIRIN EC 81 MG TABLET    Take 81 mg by mouth daily.   CARVEDILOL (COREG) 25 MG TABLET    TAKE 1 TABLET (25 MG TOTAL) BY MOUTH 2 (TWO) TIMES DAILY.   FLUTICASONE FUROATE-VILANTEROL (BREO ELLIPTA) 100-25 MCG/INH AEPB    Inhale 1 puff into the lungs daily.   FUROSEMIDE (LASIX) 20 MG TABLET    TAKE 2 TABLETS (40 MG TOTAL) BY MOUTH EVERY MORNING.   GLUCOSE BLOOD (ONETOUCH VERIO) TEST STRIP    1 each by Other route 2 (two) times daily as needed for other. And lancets 2/day   INSULIN GLARGINE (LANTUS SOLOSTAR) 100 UNIT/ML SOLOSTAR PEN    Inject 50 Units into the skin every morning. And pen needles 1/day   LOSARTAN (COZAAR) 100 MG TABLET    Take 1  tablet (100 mg total) by mouth daily.   ROSUVASTATIN (CRESTOR) 20 MG TABLET    Take 1 tablet (20 mg total) by mouth daily.   SPACER/AERO-HOLDING CHAMBERS (AEROCHAMBER MV) INHALER    Use as instructed  Modified Medications   No medications on file  Discontinued Medications   No medications on file

## 2017-01-02 ENCOUNTER — Ambulatory Visit (INDEPENDENT_AMBULATORY_CARE_PROVIDER_SITE_OTHER)
Admission: RE | Admit: 2017-01-02 | Discharge: 2017-01-02 | Disposition: A | Discharge status: Home / Self Care | Payer: BLUE CROSS/BLUE SHIELD | Source: Ambulatory Visit | Attending: Pulmonary Disease | Admitting: Pulmonary Disease

## 2017-01-02 DIAGNOSIS — D869 Sarcoidosis, unspecified: Secondary | ICD-10-CM

## 2017-01-08 ENCOUNTER — Other Ambulatory Visit: Payer: Self-pay | Admitting: Pulmonary Disease

## 2017-01-08 DIAGNOSIS — N289 Disorder of kidney and ureter, unspecified: Secondary | ICD-10-CM

## 2017-01-23 ENCOUNTER — Other Ambulatory Visit: Payer: Self-pay | Admitting: General Surgery

## 2017-01-23 ENCOUNTER — Other Ambulatory Visit: Payer: Self-pay | Admitting: Physician Assistant

## 2017-01-23 ENCOUNTER — Other Ambulatory Visit: Payer: Self-pay | Admitting: Radiology

## 2017-01-24 ENCOUNTER — Ambulatory Visit (HOSPITAL_COMMUNITY): Admission: RE | Admit: 2017-01-24 | Payer: BLUE CROSS/BLUE SHIELD | Source: Ambulatory Visit

## 2017-01-28 ENCOUNTER — Telehealth: Payer: Self-pay | Admitting: Endocrinology

## 2017-01-28 ENCOUNTER — Other Ambulatory Visit: Payer: Self-pay

## 2017-01-28 MED ORDER — LOSARTAN POTASSIUM 100 MG PO TABS: 100.0000 mg | ORAL_TABLET | Freq: Every day | ORAL | 0 refills | Status: DC

## 2017-01-28 NOTE — Telephone Encounter (Signed)
MEDICATION: losartan (COZAAR) 100 MG tablet and  carvedilol (COREG) 25 MG tablet PHARMACY:   [facility], [place] [phone] (Phone) [phone] (Fax)     IS THIS A 90 DAY SUPPLY : yes  IS PATIENT OUT OF MEDICATION: yes out of both

## 2017-02-19 ENCOUNTER — Ambulatory Visit: Payer: BLUE CROSS/BLUE SHIELD | Admitting: Cardiovascular Disease

## 2017-02-22 ENCOUNTER — Ambulatory Visit: Payer: BLUE CROSS/BLUE SHIELD | Admitting: Endocrinology

## 2017-02-24 ENCOUNTER — Other Ambulatory Visit: Payer: Self-pay | Admitting: Endocrinology

## 2017-03-21 ENCOUNTER — Encounter: Payer: Self-pay | Admitting: Physician Assistant

## 2017-03-21 ENCOUNTER — Ambulatory Visit: Payer: BLUE CROSS/BLUE SHIELD | Admitting: Physician Assistant

## 2017-03-21 VITALS — BP 160/100 | HR 81 | Temp 98.0°F | Resp 18 | Ht 70.0 in | Wt 188.2 lb

## 2017-03-21 DIAGNOSIS — N183 Chronic kidney disease, stage 3 unspecified: Secondary | ICD-10-CM

## 2017-03-21 DIAGNOSIS — E1122 Type 2 diabetes mellitus with diabetic chronic kidney disease: Secondary | ICD-10-CM | POA: Diagnosis not present

## 2017-03-21 DIAGNOSIS — E0821 Diabetes mellitus due to underlying condition with diabetic nephropathy: Secondary | ICD-10-CM | POA: Diagnosis not present

## 2017-03-21 DIAGNOSIS — Z23 Encounter for immunization: Secondary | ICD-10-CM | POA: Diagnosis not present

## 2017-03-21 DIAGNOSIS — R1011 Right upper quadrant pain: Secondary | ICD-10-CM

## 2017-03-21 DIAGNOSIS — E785 Hyperlipidemia, unspecified: Secondary | ICD-10-CM

## 2017-03-21 DIAGNOSIS — Z125 Encounter for screening for malignant neoplasm of prostate: Secondary | ICD-10-CM

## 2017-03-21 DIAGNOSIS — I5022 Chronic systolic (congestive) heart failure: Secondary | ICD-10-CM | POA: Diagnosis not present

## 2017-03-21 DIAGNOSIS — Z0000 Encounter for general adult medical examination without abnormal findings: Secondary | ICD-10-CM | POA: Diagnosis not present

## 2017-03-21 DIAGNOSIS — R7989 Other specified abnormal findings of blood chemistry: Secondary | ICD-10-CM

## 2017-03-21 DIAGNOSIS — I10 Essential (primary) hypertension: Secondary | ICD-10-CM | POA: Diagnosis not present

## 2017-03-21 DIAGNOSIS — D869 Sarcoidosis, unspecified: Secondary | ICD-10-CM

## 2017-03-21 DIAGNOSIS — G8929 Other chronic pain: Secondary | ICD-10-CM

## 2017-03-21 MED ORDER — FUROSEMIDE 20 MG PO TABS: ORAL_TABLET | ORAL | 2 refills | Status: DC

## 2017-03-21 MED ORDER — ROSUVASTATIN CALCIUM 20 MG PO TABS: 20.0000 mg | ORAL_TABLET | Freq: Every day | ORAL | 3 refills | Status: DC

## 2017-03-21 MED ORDER — CARVEDILOL 25 MG PO TABS: 25.0000 mg | ORAL_TABLET | Freq: Two times a day (BID) | ORAL | 9 refills | Status: DC

## 2017-03-21 MED ORDER — LOSARTAN POTASSIUM 100 MG PO TABS: 100.0000 mg | ORAL_TABLET | Freq: Every day | ORAL | 0 refills | Status: DC

## 2017-03-21 MED ORDER — AMLODIPINE BESYLATE 5 MG PO TABS: 5.0000 mg | ORAL_TABLET | Freq: Two times a day (BID) | ORAL | 3 refills | Status: DC

## 2017-03-21 NOTE — Patient Instructions (Addendum)
Please schedule an eye exam. If you don't have an eye specialist, I like these: [facility]  [address] [place], [place] [address]  Phone: [phone]  [facility] [place], [place], [place] [address]  Phone: [phone]       IF you received an x-ray today, you will receive an invoice from [facility]. Please contact [facility] at [phone] with questions or concerns regarding your invoice.   IF you received labwork today, you will receive an invoice from [facility]. Please contact [facility] at [phone] with questions or concerns regarding your invoice.   Our billing staff will not be able to assist you with questions regarding bills from these companies.  You will be contacted with the lab results as soon as they are available. The fastest way to get your results is to activate your My Chart account. Instructions are located on the last page of this paperwork. If you have not heard from [place] regarding the results in 2 weeks, please contact this office.

## 2017-03-21 NOTE — Progress Notes (Signed)
Patient ID: Brandon Carroll, male    DOB: 13-Apr-1961, 56 y.o.   MRN: [id number]  PCP: Harrison Mons, PA-C  Chief Complaint  Patient presents with  . Annual Exam  . Medication Refill    Crestor 20 MG, Amlodipine 10 MG, LASIX 20 MG, Losartan 100 MG, Carvedilol 25 MG, Pt states he has been out meds for x3 weeks for amlodipine and carvedilol    Subjective:   Presents for Annual Exam.  In addition, he is experiencing RIGHT sided pain upper abdominal pain, 10/10. Intermittent, but "paralyzing" when it occurs. Seen 07/18/2015 with same and had an US revealing a GB polyp. He did not proceed with surgery evaluation for possible cholecystectomy.  He ran out of all his meds except losartan 3 weeks ago. He denies adverse effects from any of them. Has had frontal headaches in the 3 weeks since he ran out of the amlodipine, furosemide and carvedilol. Home glucose has been elevated, 160's. He did see urology for low testosterone, but was told that it wasn't low enough to need testosterone replacement.  Colon Cancer Screening: Last colonoscopy 11/23/14  Prostate Cancer Screening: 2013 Dentist: Last saw the dentist a couple of years ago  Optometrist: Has not seen an optometrist in 2 years    Review of Systems As above. No CP, SOB, dizziness, GU symptoms. No vomiting, diarrhea, constipation. No muscle or joint pain. No rash.    Patient Active Problem List   Diagnosis Date Noted  . Chronic RUQ pain 03/21/2017  . Essential hypertension 07/03/2016  . Sarcoidosis 07/02/2016  . Low testosterone 10/04/2015  . Erectile dysfunction 06/21/2015  . CKD stage 3 secondary to diabetes ([place]) 06/15/2015  . Chronic systolic CHF (congestive heart failure) ([place]) 06/15/2015  . Abnormal CT of the chest 06/08/2015  . Secondary cardiomyopathy ([place]) 06/08/2015  . Anemia 06/06/2015  . Dyspnea   . Leukocytosis 05/04/2015  . Diabetes mellitus with renal complications ([place]) 29/92/4268  .  Hyperlipidemia with target LDL less than 70 09/18/2011     Prior to Admission medications   Medication Sig Start Date End Date Taking? Authorizing Provider  Albuterol Sulfate (PROAIR RESPICLICK) 341 (90 BASE) MCG/ACT AEPB Inhale 2 puffs into the lungs every 6 (six) hours as needed. 04/27/15  Yes Robyn Haber, MD  amLODipine (NORVASC) 5 MG tablet Take 1 tablet (5 mg total) by mouth 2 (two) times daily. 07/26/15  Yes Skeet Latch, MD  aspirin EC 81 MG tablet Take 81 mg by mouth daily.   Yes [provider]  BD PEN NEEDLE NANO U/F 32G X 4 MM MISC use once daily as directed 02/25/17  Yes Philemon Kingdom, MD  carvedilol (COREG) 25 MG tablet TAKE 1 TABLET (25 MG TOTAL) BY MOUTH 2 (TWO) TIMES DAILY. 07/30/16  Yes Skeet Latch, MD  fluticasone furoate-vilanterol (BREO ELLIPTA) 100-25 MCG/INH AEPB Inhale 1 puff into the lungs daily. 12/29/15  Yes Noralee Space, MD  furosemide (LASIX) 20 MG tablet TAKE 2 TABLETS (40 MG TOTAL) BY MOUTH EVERY MORNING. 04/09/16  Yes Renato Shin, MD  glucose blood (ONETOUCH VERIO) test strip 1 each by Other route 2 (two) times daily as needed for other. And lancets 2/day 02/08/16  Yes Renato Shin, MD  Insulin Glargine (LANTUS SOLOSTAR) 100 UNIT/ML Solostar Pen Inject 50 Units into the skin every morning. And pen needles 1/day 12/24/16  Yes Renato Shin, MD  losartan (COZAAR) 100 MG tablet Take 1 tablet (100 mg total) by mouth daily. 01/28/17  Yes Loanne Drilling,  Hilliard Clark, MD  rosuvastatin (CRESTOR) 20 MG tablet Take 1 tablet (20 mg total) by mouth daily. 10/04/15  Yes Harrison Mons, PA-C  Spacer/Aero-Holding Chambers (AEROCHAMBER MV) inhaler Use as instructed 07/20/15  Yes Magdalen Spatz, NP     Allergies  Allergen Reactions  . Lipitor [Atorvastatin] Hives and Itching       Objective:  Physical Exam  Constitutional: He is oriented to person, place, and time. He appears well-developed and well-nourished. He is active and cooperative. No distress.  BP (!)  160/100 (BP Location: Right Arm, Patient Position: Sitting, Cuff Size: Large)   Pulse 81   Temp 98 F (36.7 C) (Oral)   Resp 18   Ht 5\' 10"  (1.778 m)   Wt 188 lb 3.2 oz (85.4 kg)   SpO2 98%   BMI 27.00 kg/m   HENT:  Head: Normocephalic and atraumatic.  Right Ear: Hearing normal.  Left Ear: Hearing normal.  Mouth/Throat: Uvula is midline, oropharynx is clear and moist and mucous membranes are normal. No oral lesions. Dental caries present. No uvula swelling.  Eyes: Conjunctivae are normal. No scleral icterus.  Neck: Normal range of motion. Neck supple. No thyromegaly present.  Cardiovascular: Normal rate, regular rhythm and normal heart sounds.  Pulses:      Radial pulses are 2+ on the right side, and 2+ on the left side.  Pulmonary/Chest: Effort normal and breath sounds normal.  Abdominal: Soft. Normal appearance and bowel sounds are normal. There is no hepatosplenomegaly. There is no tenderness. There is no CVA tenderness.  Lymphadenopathy:       Head (right side): No tonsillar, no preauricular, no posterior auricular and no occipital adenopathy present.       Head (left side): No tonsillar, no preauricular, no posterior auricular and no occipital adenopathy present.    He has no cervical adenopathy.       Right: No supraclavicular adenopathy present.       Left: No supraclavicular adenopathy present.  Neurological: He is alert and oriented to person, place, and time. No sensory deficit.  Skin: Skin is warm, dry and intact. No rash noted. No cyanosis or erythema. Nails show no clubbing.  Psychiatric: He has a normal mood and affect. His speech is normal and behavior is normal.           Assessment & Plan:   Problem List Items Addressed This Visit    Hyperlipidemia with target LDL less than 70 (Chronic)    Goal LDL <70      Relevant Medications   rosuvastatin (CRESTOR) 20 MG tablet   losartan (COZAAR) 100 MG tablet   furosemide (LASIX) 20 MG tablet   carvedilol  (COREG) 25 MG tablet   amLODipine (NORVASC) 5 MG tablet   Other Relevant Orders   Comprehensive metabolic panel (Completed)   Lipid panel (Completed)   Diabetes mellitus with renal complications ([place])    Sounds uncontrolled. Await lab results. May need follow-up with endocrinology.      Relevant Medications   rosuvastatin (CRESTOR) 20 MG tablet   losartan (COZAAR) 100 MG tablet   Other Relevant Orders   Comprehensive metabolic panel (Completed)   Hemoglobin A1c (Completed)   Microalbumin / creatinine urine ratio (Completed)   CKD stage 3 secondary to diabetes ([facility])   Relevant Medications   rosuvastatin (CRESTOR) 20 MG tablet   losartan (COZAAR) 100 MG tablet   Other Relevant Orders   Comprehensive metabolic panel (Completed)   Chronic systolic CHF (congestive heart failure) ([place])  Relevant Medications   rosuvastatin (CRESTOR) 20 MG tablet   losartan (COZAAR) 100 MG tablet   furosemide (LASIX) 20 MG tablet   carvedilol (COREG) 25 MG tablet   amLODipine (NORVASC) 5 MG tablet   Other Relevant Orders   Microalbumin / creatinine urine ratio (Completed)   Urinalysis, dipstick only (Completed)   Urine Microscopic (Completed)   Low testosterone   Relevant Orders   TestT+TestF+SHBG (Completed)   Sarcoidosis   Essential hypertension    Uncontrolled due to running out of medications x 3 weeks. Resume.      Relevant Medications   rosuvastatin (CRESTOR) 20 MG tablet   losartan (COZAAR) 100 MG tablet   furosemide (LASIX) 20 MG tablet   carvedilol (COREG) 25 MG tablet   amLODipine (NORVASC) 5 MG tablet   Other Relevant Orders   CBC with Differential/Platelet (Completed)   Comprehensive metabolic panel (Completed)   TSH (Completed)   T4, free (Completed)   Urinalysis, dipstick only (Completed)   Urine Microscopic (Completed)   Chronic RUQ pain   Relevant Orders   CBC with Differential/Platelet (Completed)   Ambulatory referral to General Surgery    Other Visit Diagnoses      Annual physical exam    -  Primary   Age appropriate health guidance provided.   Screening for prostate cancer       Relevant Orders   PSA (Completed)   Flu vaccine need       Relevant Orders   Flu Vaccine QUAD 36+ mos IM (Completed)       Return in about 6 months (around 09/18/2017) for re-evaluation of everything.   [name], PA-C [facility] [place]

## 2017-03-21 NOTE — Progress Notes (Signed)
Subjective:    Patient ID: Brandon Carroll, male    DOB: [birth year], 56 y.o.   MRN: [id number]  HPI  Brandon Carroll is a 56 year old African American male with a past medical history significant for hyperlipidemia, type 2 diabetes mellitus, anemia, chronic kidney disease, congestive heart failure, sarcoidosis, and hypertension who presents today for his annual wellness visit. Brandon Carroll also presents with a chief complaint of right sided pain. He states the pain will come and go, but when he has it it is "paralyzing". Brandon Carroll states the pain comes out of nowhere and nothing seems to make it better or worse. He rates the pain a 10/10.  He was seem 07/18/15 and endorsed RUQ abdominal pain. He was diagnosed with a gallbladder polyp 07/2015 and was told to schedule cholecysectomy. Brandon Carroll states it feels like the same pain he previously felt.   Brandon Carroll reports he ran out of all of his medications other than the Losartan 3 weeks ago. He states he was taking his medications regularly prior to running out. Brandon Carroll reports he tolerates his medications well without side effects. He states he has not been checking his blood pressure at home. Brandon Carroll reports he has had frontal headaches for the past week. He has been treating them with Tylenol. Patient reports his vision is worsening. He used to see an optometrist regularly, but he has not seen one in a couple of years. Brandon Carroll denies chest pain, dyspnea, orthopnea, or PND. He also denies peripheral edema.  Brandon Carroll reports he is checking his blood glucose levels at home. He states his glucose levels have been in the 160s. Brandon Carroll now takes 45mg  of Lantus every morning. He denies lightheadedness or dizziness. Brandon Carroll states he is drinking more water and going to the bathroom more frequently. He denies hematuria. Brandon Carroll states his legs will occasionally hurt, but he denies changes in sensation. [name] reports he performs feet checks at home. He states he will eat a lot of baked foods. Patient reports he will not eat well balanced meals throughout the day. Brandon Carroll states he walks a lot every day. He denies alcohol use. Brandon Carroll reports he does not use of tobacco products and illicit drugs.   Brandon Carroll has a history of low testosterone. He saw Dr. Titus Carroll who stated his testerone levels were not low enough to start treatments and he does not want to go back if he is not going to get any treatments.  Review of Systems  All other review of systems negative.    Colon Cancer Screening: Last colonoscopy 11/23/14  Prostate Cancer Screening: 2013 Dentist: Last saw the dentist a couple of years ago  Optometrist: Has not seen an optometrist in 2 years   Medications:  Prior to Admission medications   Medication Sig Start Date End Date Taking? Authorizing Provider  Albuterol Sulfate (PROAIR RESPICLICK) 160 (90 BASE) MCG/ACT AEPB Inhale 2 puffs into the lungs every 6 (six) hours as needed. 04/27/15  Yes Robyn Haber, MD  amLODipine (NORVASC) 5 MG tablet Take 1 tablet (5 mg total) by mouth 2 (two) times daily. 07/26/15  Yes Skeet Latch, MD  aspirin EC 81 MG tablet Take 81 mg by mouth daily.   Yes [provider]  BD PEN NEEDLE NANO U/F 32G X 4 MM MISC use once daily as directed 02/25/17  Yes Philemon Kingdom, MD  carvedilol (COREG) 25 MG tablet TAKE 1 TABLET (25  MG TOTAL) BY MOUTH 2 (TWO) TIMES DAILY. 07/30/16  Yes Skeet Latch, MD  fluticasone furoate-vilanterol (BREO ELLIPTA) 100-25 MCG/INH AEPB Inhale 1 puff into the lungs daily. 12/29/15  Yes Noralee Space, MD  furosemide (LASIX) 20 MG tablet TAKE 2 TABLETS (40 MG TOTAL) BY MOUTH EVERY MORNING. 04/09/16  Yes Renato Shin, MD  glucose blood (ONETOUCH VERIO) test strip 1 each by Other route 2 (two) times daily as needed for other. And lancets 2/day 02/08/16  Yes Renato Shin, MD  Insulin Glargine (LANTUS  SOLOSTAR) 100 UNIT/ML Solostar Pen Inject 50 Units into the skin every morning. And pen needles 1/day 12/24/16  Yes Renato Shin, MD  losartan (COZAAR) 100 MG tablet Take 1 tablet (100 mg total) by mouth daily. 01/28/17  Yes Renato Shin, MD  rosuvastatin (CRESTOR) 20 MG tablet Take 1 tablet (20 mg total) by mouth daily. 10/04/15  Yes Harrison Mons, PA-C  Spacer/Aero-Holding Chambers (AEROCHAMBER MV) inhaler Use as instructed 07/20/15  Yes Magdalen Spatz, NP   Allergies:  Allergies  Allergen Reactions  . Lipitor [Atorvastatin] Hives and Itching   Chronic Medical Conditions:  Patient Active Problem List   Diagnosis Date Noted  . Essential hypertension 07/03/2016  . Renal insufficiency 07/03/2016  . Sarcoidosis 07/02/2016  . Low testosterone 10/04/2015  . Erectile dysfunction 06/21/2015  . CKD stage 3 secondary to diabetes ([place]) 06/15/2015  . Chronic systolic CHF (congestive heart failure) ([place]) 06/15/2015  . Abnormal CT of the chest 06/08/2015  . Secondary cardiomyopathy ([place]) 06/08/2015  . Anemia 06/06/2015  . Dyspnea   . Leukocytosis 05/04/2015  . Diabetes mellitus with renal complications ([place]) 28/76/8115  . Hyperlipidemia with target LDL less than 70 09/18/2011   Family History: Family History  Problem Relation Age of Onset  . Arthritis Mother   . COPD Mother   . Diabetes Mother        was thin, took insulin  . Hypertension Mother   . Hypertension Father   . Arthritis Sister        rheumatoid  . COPD Sister   . Diabetes Brother   . Colon cancer Neg Hx    Surgical History:  Past Surgical History:  Procedure Laterality Date  . HERNIA REPAIR  7th grade   umbilical      Objective:   Physical Exam  Constitutional: He appears well-developed and well-nourished. He is active and cooperative. No distress.  BP (!) 160/100 (BP Location: Right Arm, Patient Position: Sitting, Cuff Size: Large)   Pulse 81   Temp 98 F (36.7 C) (Oral)   Resp 18   Ht 5\' 10"  (1.778 m)   Wt  188 lb 3.2 oz (85.4 kg)   SpO2 98%   BMI 27.00 kg/m    HENT:  Head: Normocephalic and atraumatic. Hair is normal.  Right Ear: Tympanic membrane, external ear and ear canal normal. Tympanic membrane is not erythematous and not bulging. No middle ear effusion.  Left Ear: Tympanic membrane, external ear and ear canal normal. Tympanic membrane is not erythematous and not bulging.  No middle ear effusion.  Nose: Nose normal. No rhinorrhea.  Mouth/Throat: Uvula is midline and oropharynx is clear and moist. No oral lesions. Abnormal dentition. No lacerations. No oropharyngeal exudate.  Eyes: Conjunctivae and EOM are normal. Pupils are equal, round, and reactive to light. No scleral icterus.  Fundoscopic exam:      The right eye shows no AV nicking, no exudate and no papilledema. The right eye shows red reflex.  The left eye shows no AV nicking, no exudate and no papilledema. The left eye shows red reflex.  Neck: Normal range of motion and full passive range of motion without pain. Neck supple. No thyromegaly present.  Cardiovascular: Normal rate, regular rhythm, S1 normal, S2 normal, normal heart sounds and intact distal pulses.  No murmur heard. Pulses:      Radial pulses are 2+ on the right side, and 2+ on the left side.       Dorsalis pedis pulses are 2+ on the right side, and 2+ on the left side.       Posterior tibial pulses are 1+ on the right side, and 1+ on the left side.  Pulmonary/Chest: Effort normal and breath sounds normal. He has no wheezes.  Abdominal: Bowel sounds are normal. He exhibits distension. He exhibits no mass. There is no tenderness. There is no rebound and no guarding.  Musculoskeletal:  Strength 5/5 in all extremities  Lymphadenopathy:    He has no cervical adenopathy.  Neurological: He is alert. He displays normal reflexes. No cranial nerve deficit or sensory deficit. He exhibits normal muscle tone. Coordination normal.  Reflex Scores:      Tricep reflexes are  2+ on the right side and 2+ on the left side.      Bicep reflexes are 2+ on the right side and 2+ on the left side.      Brachioradialis reflexes are 2+ on the right side and 2+ on the left side.      Patellar reflexes are 2+ on the right side and 2+ on the left side.      Achilles reflexes are 2+ on the right side and 2+ on the left side. Skin: Skin is warm and dry.      Assessment & Plan:  1. Diabetes mellitus due to underlying condition with diabetic nephropathy, without long-term current use of insulin (HCC) - CMP - Hemoglobin A1c - Microalbumin / creatinine urine ratio - Continue Lantus 45mg  daily  - Encouraged patient to attempt to eat well-balanced meals that include fruits and vegetables, whole grains, lean proteins, and low sodium foods. Also encouraged patient to get at least 30 minutes of exercise 5 times per week.  - Also encouraged patient to check blood glucose levels at home.  - Return to clinic in 6 months for diabetes follow-up.  2. CKD stage 3 secondary to diabetes ([place]) - CMP  - Instructed patient to return to clinic with changes in urinary frequency, discolored urine or hematuria.  3. Chronic systolic CHF (congestive heart failure) (HCC) - Microalbumin / creatinine urine ratio - Urinalysis, dipstick only - Urine Microscopic - Continue Furosemide 40mg  daily  - Continue Carvedilol 25mg  BID - Instructed patient to return to clinic with dyspnea, PND, or peripheral edema  4. Essential hypertension - CBC with Differential/Platelet - Comprehensive metabolic panel - TSH - T4, free - Urinalysis, dipstick only - Urine Microscopic - Continue Losartan 100mg  daily  - Continue Furosemide 40mg  daily - Continue Carvedilol 25 mg BID - Continue Amlodipine 5mg  BID - Encouraged patient to attempt to eat well-balanced meals that include fruits and vegetables, whole grains, lean proteins, and low sodium foods. Also encouraged patient to get at least 30 minutes of exercise 5  times per week.  - Also encouraged patient to check blood pressure at home.  - Return to clinic in 6 months for hypertension follow-up.  5. Hyperlipidemia with target LDL less than 70 - CMP  - Lipid panel -  Continue Crestor 20mg  daily  - Encouraged patient to attempt to eat well-balanced meals that include fruits and vegetables, whole grains, lean proteins, and low sodium foods. Also encouraged patient to get at least 30 minutes of exercise 5 times per week.  - Return to clinic in 6 months for hyperlipidemia follow-up.  6. Low testosterone - TestT+TestF+SHBG  7. Chronic RUQ pain - CBC with Differential/Platelet - Ambulatory referral to General Surgery - Instructed patient to avoid fatty foods  8. Screening for prostate cancer - PSA  10. Flu vaccine need - Flu Vaccine QUAD 36+ mos IM  Bobbette Eakes, PA-S

## 2017-03-22 LAB — URINALYSIS, MICROSCOPIC ONLY: Epithelial Cells (non renal): NONE SEEN /hpf (ref 0–10)

## 2017-03-23 LAB — URINALYSIS, DIPSTICK ONLY
Bilirubin, UA: NEGATIVE
KETONES UA: NEGATIVE
LEUKOCYTES UA: NEGATIVE
Nitrite, UA: NEGATIVE
SPEC GRAV UA: 1.011 (ref 1.005–1.030)
Urobilinogen, Ur: 0.2 mg/dL (ref 0.2–1.0)
pH, UA: 6.5 (ref 5.0–7.5)

## 2017-03-23 LAB — CBC WITH DIFFERENTIAL/PLATELET
BASOS: 2 %
Basophils Absolute: 0.1 10*3/uL (ref 0.0–0.2)
EOS (ABSOLUTE): 0.2 10*3/uL (ref 0.0–0.4)
EOS: 3 %
HEMATOCRIT: 38.7 % (ref 37.5–51.0)
Hemoglobin: 12.5 g/dL — ABNORMAL LOW (ref 13.0–17.7)
IMMATURE GRANS (ABS): 0 10*3/uL (ref 0.0–0.1)
Immature Granulocytes: 0 %
LYMPHS: 17 %
Lymphocytes Absolute: 1.1 10*3/uL (ref 0.7–3.1)
MCH: 26 pg — AB (ref 26.6–33.0)
MCHC: 32.3 g/dL (ref 31.5–35.7)
MCV: 81 fL (ref 79–97)
Monocytes Absolute: 0.8 10*3/uL (ref 0.1–0.9)
Monocytes: 12 %
NEUTROS ABS: 4.2 10*3/uL (ref 1.4–7.0)
Neutrophils: 66 %
PLATELETS: 283 10*3/uL (ref 150–379)
RBC: 4.81 x10E6/uL (ref 4.14–5.80)
RDW: 15.3 % (ref 12.3–15.4)
WBC: 6.4 10*3/uL (ref 3.4–10.8)

## 2017-03-23 LAB — TESTT+TESTF+SHBG
Sex Hormone Binding: 79.9 nmol/L — ABNORMAL HIGH (ref 19.3–76.4)
TESTOSTERONE FREE: 7 pg/mL — AB (ref 7.2–24.0)
TESTOSTERONE, TOTAL: 481.9 ng/dL (ref 264.0–916.0)

## 2017-03-23 LAB — COMPREHENSIVE METABOLIC PANEL
A/G RATIO: 0.9 — AB (ref 1.2–2.2)
ALBUMIN: 3.5 g/dL (ref 3.5–5.5)
ALT: 26 IU/L (ref 0–44)
AST: 37 IU/L (ref 0–40)
Alkaline Phosphatase: 192 IU/L — ABNORMAL HIGH (ref 39–117)
BUN / CREAT RATIO: 11 (ref 9–20)
BUN: 25 mg/dL — ABNORMAL HIGH (ref 6–24)
CHLORIDE: 103 mmol/L (ref 96–106)
CO2: 24 mmol/L (ref 20–29)
Calcium: 9.5 mg/dL (ref 8.7–10.2)
Creatinine, Ser: 2.31 mg/dL — ABNORMAL HIGH (ref 0.76–1.27)
GFR, EST AFRICAN AMERICAN: 36 mL/min/{1.73_m2} — AB (ref >59)
GFR, EST NON AFRICAN AMERICAN: 31 mL/min/{1.73_m2} — AB (ref >59)
GLOBULIN, TOTAL: 3.8 g/dL (ref 1.5–4.5)
Glucose: 133 mg/dL — ABNORMAL HIGH (ref 65–99)
POTASSIUM: 4.7 mmol/L (ref 3.5–5.2)
SODIUM: 142 mmol/L (ref 134–144)
TOTAL PROTEIN: 7.3 g/dL (ref 6.0–8.5)

## 2017-03-23 LAB — MICROALBUMIN / CREATININE URINE RATIO
Creatinine, Urine: 41.3 mg/dL
MICROALB/CREAT RATIO: 3736.3 mg/g{creat} — AB (ref 0.0–30.0)
MICROALBUM., U, RANDOM: 1543.1 ug/mL

## 2017-03-23 LAB — LIPID PANEL
CHOLESTEROL TOTAL: 305 mg/dL — AB (ref 100–199)
Chol/HDL Ratio: 5.1 ratio — ABNORMAL HIGH (ref 0.0–5.0)
HDL: 60 mg/dL (ref >39)
LDL Calculated: 212 mg/dL — ABNORMAL HIGH (ref 0–99)
Triglycerides: 163 mg/dL — ABNORMAL HIGH (ref 0–149)
VLDL Cholesterol Cal: 33 mg/dL (ref 5–40)

## 2017-03-23 LAB — HEMOGLOBIN A1C
Est. average glucose Bld gHb Est-mCnc: 206 mg/dL
Hgb A1c MFr Bld: 8.8 % — ABNORMAL HIGH (ref 4.8–5.6)

## 2017-03-23 LAB — T4, FREE: FREE T4: 1 ng/dL (ref 0.82–1.77)

## 2017-03-23 LAB — TSH: TSH: 2.4 u[IU]/mL (ref 0.450–4.500)

## 2017-03-23 LAB — PSA: Prostate Specific Ag, Serum: 1.3 ng/mL (ref 0.0–4.0)

## 2017-04-12 NOTE — Assessment & Plan Note (Signed)
Sounds uncontrolled. Await lab results. May need follow-up with endocrinology.

## 2017-04-12 NOTE — Assessment & Plan Note (Signed)
Uncontrolled due to running out of medications x 3 weeks. Resume.

## 2017-04-12 NOTE — Assessment & Plan Note (Signed)
Goal LDL <70

## 2017-04-30 ENCOUNTER — Ambulatory Visit: Payer: Self-pay | Admitting: General Surgery

## 2017-04-30 NOTE — H&P (Signed)
History of Present Illness  The patient is a 56 year old male who presents for evaluation of gall stones. Referred by: Daphane Shepherd, PA Chief Complaint: Right upper quadrant abdominal pain  Patient is a 56 year old male, with a history of diabetes, hypercholesterolemia, hypertension, with approximately 2 year history of abdominal pain to the right upper quadrant. He states that this is associated with high fatty meals. He states the pain is fairly sharp last approximately 15-20 minutes. He states he does have some associated nausea vomiting. He states that after the bouts of emesis he does feel somewhat better.  Patient did have a ultrasound in February 2017 which revealed a 3.6 cm gallbladder polyp. There were no stones seen at that time. I did review the study personally    Past Surgical History ( No pertinent past surgical history   Diagnostic Studies History  Colonoscopy  1-5 years ago  Allergies ( Lipitor *ANTIHYPERLIPIDEMICS*   Medication History AmLODIPine Besylate (5MG  Tablet, Oral) Active. Carvedilol (25MG  Tablet, Oral) Active. Furosemide (20MG  Tablet, Oral) Active. Losartan Potassium (100MG  Tablet, Oral) Active. Crestor (20MG  Tablet, Oral) Active. Lantus (100UNIT/ML Solution, Subcutaneous) Active. Medications Reconciled  Social History Alcohol use  Occasional alcohol use. Caffeine use  Coffee. No drug use  Tobacco use  Never smoker.  Family History  Diabetes Mellitus  Mother. Hypertension  Brother, Father, Sister.  Other Problems  Diabetes Mellitus  High blood pressure     Review of Systems  General Not Present- Appetite Loss, Chills, Fatigue, Fever, Night Sweats, Weight Gain and Weight Loss. HEENT Not Present- Earache, Hearing Loss, Hoarseness, Nose Bleed, Oral Ulcers, Ringing in the Ears, Seasonal Allergies, Sinus Pain, Sore Throat, Visual Disturbances, Wears glasses/contact lenses and Yellow Eyes. Respiratory Not Present- Cough  and Difficulty Breathing. Breast Not Present- Breast Mass, Breast Pain, Nipple Discharge and Skin Changes. Cardiovascular Not Present- Chest Pain, Difficulty Breathing Lying Down, Leg Cramps, Palpitations, Rapid Heart Rate, Shortness of Breath and Swelling of Extremities. Gastrointestinal Not Present- Abdominal Pain, Bloating, Bloody Stool, Change in Bowel Habits, Chronic diarrhea, Constipation, Difficulty Swallowing, Excessive gas, Gets full quickly at meals, Hemorrhoids, Indigestion, Nausea, Rectal Pain and Vomiting. Male Genitourinary Not Present- Blood in Urine, Change in Urinary Stream, Frequency, Impotence, Nocturia, Painful Urination, Urgency and Urine Leakage. Musculoskeletal Not Present- Back Pain, Joint Pain, Joint Stiffness, Muscle Pain, Muscle Weakness and Swelling of Extremities. Neurological Not Present- Weakness. All other systems negative  Vitals  04/30/2017 9:11 AM Weight: 194 lb Height: 69in Body Surface Area: 2.04 m Body Mass Index: 28.65 kg/m  Temp.: 30F(Oral)  Pulse: 79 (Regular)  BP: 146/78 (Sitting, Left Arm, Standard)       Physical Exam  The physical exam findings are as follows: Note:Constitutional: No acute distress, conversant, appears stated age  Eyes: Anicteric sclerae, moist conjunctiva, no lid lag  Neck: No thyromegaly, trachea midline, no cervical lymphadenopathy  Lungs: Clear to auscultation biilaterally, normal respiratory effot  Cardiovascular: regular rate & rhythm, no murmurs, no peripheal edema, pedal pulses 2+  GI: Soft, no masses or hepatosplenomegaly, non-tender to palpation  MSK: Normal gait, no clubbing cyanosis, edema  Skin: No rashes, palpation reveals normal skin turgor  Psychiatric: Appropriate judgment and insight, oriented to person, place, and time    Assessment & Plan (A GALLBLADDER POLYP (K82.4) Impression: Patient is a 56 year old male with a history of hypertension, hypercholesterolemia, diabetes  comes in with a large gallbladder polyp as well as what appears to be symptomatic cholelithiasis.  1. We will proceed to the operating room  for a laparoscopic cholecystectomy  2. Risks and benefits were discussed with the patient to generally include, but not limited to: infection, bleeding, possible need for post op ERCP, damage to the bile ducts, bile leak, and possible need for further surgery. Alternatives were offered and described. All questions were answered and the patient voiced understanding of the procedure and wishes to proceed at this point with a laparoscopic cholecystectomy

## 2017-05-09 LAB — HM DIABETES EYE EXAM

## 2017-05-23 ENCOUNTER — Telehealth: Payer: Self-pay | Admitting: Physician Assistant

## 2017-05-23 NOTE — Telephone Encounter (Signed)
Please advise 

## 2017-05-23 NOTE — Telephone Encounter (Signed)
Copied from [facility] [id number] [id number]. Topic: Quick Communication - See Telephone Encounter >> May 23, 2017  3:06 PM Oneta Rack wrote: CRM for notification. See Telephone encounter for:   05/23/17.  Relation to pt: self  Call back number: [phone]    Reason for call:  Brandon Carroll patient of Dr. [name] would like to refer her fianc  [name], [name] to establish care, please advise

## 2017-05-27 NOTE — Telephone Encounter (Signed)
LVM giving response

## 2017-05-27 NOTE — Telephone Encounter (Signed)
Unfortunately not taking new patients right now

## 2017-06-24 NOTE — Progress Notes (Signed)
07/02/2016- noted in Epic- CXR  01/02/2017- noted in Epic-CT chest wo contrast

## 2017-06-24 NOTE — Patient Instructions (Addendum)
Brandon Carroll  06/24/2017   Your procedure is scheduled on: Friday 06/28/2017  Report to [facility] Main  Entrance              Report to admitting at 1000 AM   Call this number if you have problems the morning of surgery [phone]  How to Manage Your Diabetes Before and After Surgery  Why is it important to control my blood sugar before and after surgery? . Improving blood sugar levels before and after surgery helps healing and can limit problems. . A way of improving blood sugar control is eating a healthy diet by: o  Eating less sugar and carbohydrates o  Increasing activity/exercise o  Talking with your doctor about reaching your blood sugar goals . High blood sugars (greater than 180 mg/dL) can raise your risk of infections and slow your recovery, so you will need to focus on controlling your diabetes during the weeks before surgery. . Make sure that the doctor who takes care of your diabetes knows about your planned surgery including the date and location.  How do I manage my blood sugar before surgery? . Check your blood sugar at least 4 times a day, starting 2 days before surgery, to make sure that the level is not too high or low. o Check your blood sugar the morning of your surgery when you wake up and every 2 hours until you get to the Short Stay unit. . If your blood sugar is less than 70 mg/dL, you will need to treat for low blood sugar: o Do not take insulin. o Treat a low blood sugar (less than 70 mg/dL) with  cup of clear juice (cranberry or apple), 4 glucose tablets, OR glucose gel. o Recheck blood sugar in 15 minutes after treatment (to make sure it is greater than 70 mg/dL). If your blood sugar is not greater than 70 mg/dL on recheck, call [phone] for further instructions. . Report your blood sugar to the short stay nurse when you get to Short Stay.  . If you are admitted to the hospital after surgery: o Your blood sugar will be  checked by the staff and you will probably be given insulin after surgery (instead of oral diabetes medicines) to make sure you have good blood sugar levels. o The goal for blood sugar control after surgery is 80-180 mg/dL.   WHAT DO I DO ABOUT MY DIABETES MEDICATION?  Marland Kitchen Do not take oral diabetes medicines (pills) the morning of surgery.       . THE MORNING OF SURGERY, take   25    units of   Lantus      insulin.        Remember: Do not eat food or drink liquids :After Midnight.     Take these medicines the morning of surgery with A SIP OF WATER: Amlodipine (Norvasc), use Aerochamber inhaler , use Albuterol inhaler if needed, and use Breo Ellipta inhaler and bring all inhalers with you to the hospital the morning of surgery, carvedilol (coreg)               DO NOT TAKE ANY DIABETIC MEDICATIONS DAY OF YOUR SURGERY                               You may not have any metal on your body  including hair pins and              piercings  Do not wear jewelry, make-up, lotions, powders or perfumes, deodorant             Do not wear nail polish.  Do not shave  48 hours prior to surgery.              Men may shave face and neck.   Do not bring valuables to the hospital. [place].  Contacts, dentures or bridgework may not be worn into surgery.  Leave suitcase in the car. After surgery it may be brought to your room.     Patients discharged the day of surgery will not be allowed to drive home.  Name and phone number of your driver:girlfriend- Raoul Pitch  Special Instructions: N/A              Please read over the following fact sheets you were given: _____________________________________________________________________             [facility] - Preparing for Surgery Before surgery, you can play an important role.  Because skin is not sterile, your skin needs to be as free of germs as possible.  You can reduce the number of germs on your  skin by washing with CHG (chlorahexidine gluconate) soap before surgery.  CHG is an antiseptic cleaner which kills germs and bonds with the skin to continue killing germs even after washing. Please DO NOT use if you have an allergy to CHG or antibacterial soaps.  If your skin becomes reddened/irritated stop using the CHG and inform your nurse when you arrive at Short Stay. Do not shave (including legs and underarms) for at least 48 hours prior to the first CHG shower.  You may shave your face/neck. Please follow these instructions carefully:  1.  Shower with CHG Soap the night before surgery and the  morning of Surgery.  2.  If you choose to wash your hair, wash your hair first as usual with your  normal  shampoo.  3.  After you shampoo, rinse your hair and body thoroughly to remove the  shampoo.                           4.  Use CHG as you would any other liquid soap.  You can apply chg directly  to the skin and wash                       Gently with a scrungie or clean washcloth.  5.  Apply the CHG Soap to your body ONLY FROM THE NECK DOWN.   Do not use on face/ open                           Wound or open sores. Avoid contact with eyes, ears mouth and genitals (private parts).                       Wash face,  Genitals (private parts) with your normal soap.             6.  Wash thoroughly, paying special attention to the area where your surgery  will be performed.  7.  Thoroughly rinse your body with  warm water from the neck down.  8.  DO NOT shower/wash with your normal soap after using and rinsing off  the CHG Soap.                9.  Pat yourself dry with a clean towel.            10.  Wear clean pajamas.            11.  Place clean sheets on your bed the night of your first shower and do not  sleep with pets. Day of Surgery : Do not apply any lotions/deodorants the morning of surgery.  Please wear clean clothes to the hospital/surgery center.  FAILURE TO FOLLOW THESE INSTRUCTIONS MAY  RESULT IN THE CANCELLATION OF YOUR SURGERY PATIENT SIGNATURE_________________________________  NURSE SIGNATURE__________________________________

## 2017-06-25 ENCOUNTER — Encounter (HOSPITAL_COMMUNITY)
Admission: RE | Admit: 2017-06-25 | Discharge: 2017-06-25 | Disposition: A | Discharge status: Home / Self Care | Payer: Managed Care, Other (non HMO) | Source: Ambulatory Visit | Attending: General Surgery | Admitting: General Surgery

## 2017-06-25 ENCOUNTER — Other Ambulatory Visit: Payer: Self-pay

## 2017-06-25 ENCOUNTER — Encounter (HOSPITAL_COMMUNITY): Payer: Self-pay

## 2017-06-25 DIAGNOSIS — K811 Chronic cholecystitis: Secondary | ICD-10-CM | POA: Diagnosis not present

## 2017-06-25 DIAGNOSIS — Z79899 Other long term (current) drug therapy: Secondary | ICD-10-CM | POA: Diagnosis not present

## 2017-06-25 DIAGNOSIS — I110 Hypertensive heart disease with heart failure: Secondary | ICD-10-CM | POA: Diagnosis not present

## 2017-06-25 DIAGNOSIS — E119 Type 2 diabetes mellitus without complications: Secondary | ICD-10-CM | POA: Diagnosis not present

## 2017-06-25 DIAGNOSIS — E7800 Pure hypercholesterolemia, unspecified: Secondary | ICD-10-CM | POA: Diagnosis not present

## 2017-06-25 DIAGNOSIS — R1011 Right upper quadrant pain: Secondary | ICD-10-CM | POA: Diagnosis present

## 2017-06-25 DIAGNOSIS — I509 Heart failure, unspecified: Secondary | ICD-10-CM | POA: Diagnosis not present

## 2017-06-25 DIAGNOSIS — Z794 Long term (current) use of insulin: Secondary | ICD-10-CM | POA: Diagnosis not present

## 2017-06-25 LAB — CBC
HEMATOCRIT: 33.8 % — AB (ref 39.0–52.0)
Hemoglobin: 11.6 g/dL — ABNORMAL LOW (ref 13.0–17.0)
MCH: 26.4 pg (ref 26.0–34.0)
MCHC: 34.3 g/dL (ref 30.0–36.0)
MCV: 76.8 fL — ABNORMAL LOW (ref 78.0–100.0)
Platelets: 259 10*3/uL (ref 150–400)
RBC: 4.4 MIL/uL (ref 4.22–5.81)
RDW: 13.5 % (ref 11.5–15.5)
WBC: 7.8 10*3/uL (ref 4.0–10.5)

## 2017-06-25 LAB — BASIC METABOLIC PANEL
Anion gap: 9 (ref 5–15)
BUN: 32 mg/dL — AB (ref 6–20)
CHLORIDE: 105 mmol/L (ref 101–111)
CO2: 23 mmol/L (ref 22–32)
Calcium: 9.1 mg/dL (ref 8.9–10.3)
Creatinine, Ser: 2.6 mg/dL — ABNORMAL HIGH (ref 0.61–1.24)
GFR calc Af Amer: 30 mL/min — ABNORMAL LOW (ref >60)
GFR calc non Af Amer: 26 mL/min — ABNORMAL LOW (ref >60)
Glucose, Bld: 236 mg/dL — ABNORMAL HIGH (ref 65–99)
POTASSIUM: 4.4 mmol/L (ref 3.5–5.1)
SODIUM: 137 mmol/L (ref 135–145)

## 2017-06-25 LAB — HEMOGLOBIN A1C
HEMOGLOBIN A1C: 9.1 % — AB (ref 4.8–5.6)
MEAN PLASMA GLUCOSE: 214.47 mg/dL

## 2017-06-25 LAB — GLUCOSE, CAPILLARY: Glucose-Capillary: 220 mg/dL — ABNORMAL HIGH (ref 65–99)

## 2017-06-28 ENCOUNTER — Encounter (HOSPITAL_COMMUNITY): Payer: Self-pay | Admitting: *Deleted

## 2017-06-28 ENCOUNTER — Encounter (HOSPITAL_COMMUNITY)
Admission: RE | Disposition: A | Discharge status: Home / Self Care | Payer: Self-pay | Source: Ambulatory Visit | Attending: General Surgery

## 2017-06-28 ENCOUNTER — Ambulatory Visit (HOSPITAL_COMMUNITY)
Admission: RE | Admit: 2017-06-28 | Discharge: 2017-06-28 | Disposition: A | Discharge status: Home / Self Care | Payer: Managed Care, Other (non HMO) | Source: Ambulatory Visit | Attending: General Surgery | Admitting: General Surgery

## 2017-06-28 ENCOUNTER — Ambulatory Visit (HOSPITAL_COMMUNITY): Payer: Managed Care, Other (non HMO) | Admitting: Anesthesiology

## 2017-06-28 DIAGNOSIS — I509 Heart failure, unspecified: Secondary | ICD-10-CM | POA: Insufficient documentation

## 2017-06-28 DIAGNOSIS — K811 Chronic cholecystitis: Secondary | ICD-10-CM | POA: Diagnosis not present

## 2017-06-28 DIAGNOSIS — I110 Hypertensive heart disease with heart failure: Secondary | ICD-10-CM | POA: Insufficient documentation

## 2017-06-28 DIAGNOSIS — Z794 Long term (current) use of insulin: Secondary | ICD-10-CM | POA: Insufficient documentation

## 2017-06-28 DIAGNOSIS — E119 Type 2 diabetes mellitus without complications: Secondary | ICD-10-CM | POA: Insufficient documentation

## 2017-06-28 DIAGNOSIS — E7800 Pure hypercholesterolemia, unspecified: Secondary | ICD-10-CM | POA: Insufficient documentation

## 2017-06-28 DIAGNOSIS — Z79899 Other long term (current) drug therapy: Secondary | ICD-10-CM | POA: Insufficient documentation

## 2017-06-28 HISTORY — PX: CHOLECYSTECTOMY: SHX55

## 2017-06-28 LAB — GLUCOSE, CAPILLARY
GLUCOSE-CAPILLARY: 173 mg/dL — AB (ref 65–99)
GLUCOSE-CAPILLARY: 188 mg/dL — AB (ref 65–99)
Glucose-Capillary: 143 mg/dL — ABNORMAL HIGH (ref 65–99)

## 2017-06-28 SURGERY — LAPAROSCOPIC CHOLECYSTECTOMY
Anesthesia: General | Site: Abdomen

## 2017-06-28 MED ORDER — PROPOFOL 10 MG/ML IV BOLUS
INTRAVENOUS | Status: AC
  Filled 2017-06-28: qty 20

## 2017-06-28 MED ORDER — OXYCODONE HCL 5 MG PO TABS: 5.0000 mg | ORAL_TABLET | Freq: Once | ORAL | Status: DC | PRN

## 2017-06-28 MED ORDER — DEXAMETHASONE SODIUM PHOSPHATE 10 MG/ML IJ SOLN
INTRAMUSCULAR | Status: AC
  Filled 2017-06-28: qty 1

## 2017-06-28 MED ORDER — LABETALOL HCL 5 MG/ML IV SOLN
10.0000 mg | Freq: Once | INTRAVENOUS | Status: AC
  Administered 2017-06-28: 10 mg via INTRAVENOUS

## 2017-06-28 MED ORDER — SUGAMMADEX SODIUM 500 MG/5ML IV SOLN
INTRAVENOUS | Status: DC | PRN
  Administered 2017-06-28: 500 mg via INTRAVENOUS

## 2017-06-28 MED ORDER — FENTANYL CITRATE (PF) 100 MCG/2ML IJ SOLN
25.0000 ug | INTRAMUSCULAR | Status: DC | PRN
  Administered 2017-06-28: 50 ug via INTRAVENOUS

## 2017-06-28 MED ORDER — BUPIVACAINE HCL (PF) 0.25 % IJ SOLN
INTRAMUSCULAR | Status: DC | PRN
  Administered 2017-06-28: 5 mL

## 2017-06-28 MED ORDER — ONDANSETRON HCL 4 MG/2ML IJ SOLN
INTRAMUSCULAR | Status: DC | PRN
  Administered 2017-06-28: 4 mg via INTRAVENOUS

## 2017-06-28 MED ORDER — 0.9 % SODIUM CHLORIDE (POUR BTL) OPTIME
TOPICAL | Status: DC | PRN
  Administered 2017-06-28: 1000 mL

## 2017-06-28 MED ORDER — EPHEDRINE 5 MG/ML INJ
INTRAVENOUS | Status: AC
  Filled 2017-06-28: qty 10

## 2017-06-28 MED ORDER — LACTATED RINGERS IV SOLN
INTRAVENOUS | Status: DC
  Administered 2017-06-28: 11:00:00 via INTRAVENOUS

## 2017-06-28 MED ORDER — ACETAMINOPHEN 325 MG PO TABS: 650.0000 mg | ORAL_TABLET | ORAL | Status: DC | PRN

## 2017-06-28 MED ORDER — MIDAZOLAM HCL 2 MG/2ML IJ SOLN
INTRAMUSCULAR | Status: AC
  Filled 2017-06-28: qty 2

## 2017-06-28 MED ORDER — ROCURONIUM BROMIDE 10 MG/ML (PF) SYRINGE
PREFILLED_SYRINGE | INTRAVENOUS | Status: DC | PRN
  Administered 2017-06-28: 50 mg via INTRAVENOUS

## 2017-06-28 MED ORDER — FENTANYL CITRATE (PF) 100 MCG/2ML IJ SOLN
INTRAMUSCULAR | Status: AC
  Filled 2017-06-28: qty 2

## 2017-06-28 MED ORDER — CHLORHEXIDINE GLUCONATE CLOTH 2 % EX PADS
6.0000 | MEDICATED_PAD | Freq: Once | CUTANEOUS | Status: AC
Start: 2017-06-28 — End: 2017-06-28
  Administered 2017-06-28: 6 via TOPICAL

## 2017-06-28 MED ORDER — SUGAMMADEX SODIUM 200 MG/2ML IV SOLN
INTRAVENOUS | Status: AC
  Filled 2017-06-28: qty 2

## 2017-06-28 MED ORDER — TRAMADOL HCL 50 MG PO TABS: 50.0000 mg | ORAL_TABLET | Freq: Four times a day (QID) | ORAL | 0 refills | Status: DC | PRN

## 2017-06-28 MED ORDER — CEFAZOLIN SODIUM-DEXTROSE 2-4 GM/100ML-% IV SOLN
2.0000 g | INTRAVENOUS | Status: AC
  Administered 2017-06-28: 2 g via INTRAVENOUS
  Filled 2017-06-28: qty 100

## 2017-06-28 MED ORDER — SODIUM CHLORIDE 0.9 % IV SOLN: 250.0000 mL | INTRAVENOUS | Status: DC | PRN

## 2017-06-28 MED ORDER — SODIUM CHLORIDE 0.9% FLUSH: 3.0000 mL | Freq: Two times a day (BID) | INTRAVENOUS | Status: DC

## 2017-06-28 MED ORDER — ONDANSETRON HCL 4 MG/2ML IJ SOLN: 4.0000 mg | Freq: Four times a day (QID) | INTRAMUSCULAR | Status: DC | PRN

## 2017-06-28 MED ORDER — FENTANYL CITRATE (PF) 250 MCG/5ML IJ SOLN
INTRAMUSCULAR | Status: AC
Start: 2017-06-28 — End: ?
  Filled 2017-06-28: qty 5

## 2017-06-28 MED ORDER — SUGAMMADEX SODIUM 500 MG/5ML IV SOLN
INTRAVENOUS | Status: AC
  Filled 2017-06-28: qty 5

## 2017-06-28 MED ORDER — OXYCODONE HCL 5 MG/5ML PO SOLN
5.0000 mg | Freq: Once | ORAL | Status: DC | PRN
  Filled 2017-06-28: qty 5

## 2017-06-28 MED ORDER — FENTANYL CITRATE (PF) 250 MCG/5ML IJ SOLN
INTRAMUSCULAR | Status: DC | PRN
  Administered 2017-06-28: 100 ug via INTRAVENOUS

## 2017-06-28 MED ORDER — LIDOCAINE 2% (20 MG/ML) 5 ML SYRINGE
INTRAMUSCULAR | Status: DC | PRN
  Administered 2017-06-28: 50 mg via INTRAVENOUS

## 2017-06-28 MED ORDER — LACTATED RINGERS IR SOLN
Status: DC | PRN
  Administered 2017-06-28: 1000 mL

## 2017-06-28 MED ORDER — SODIUM CHLORIDE 0.9% FLUSH: 3.0000 mL | INTRAVENOUS | Status: DC | PRN

## 2017-06-28 MED ORDER — LIDOCAINE 2% (20 MG/ML) 5 ML SYRINGE
INTRAMUSCULAR | Status: AC
  Filled 2017-06-28: qty 5

## 2017-06-28 MED ORDER — LABETALOL HCL 5 MG/ML IV SOLN
10.0000 mg | INTRAVENOUS | Status: DC | PRN
  Administered 2017-06-28: 10 mg via INTRAVENOUS

## 2017-06-28 MED ORDER — CHLORHEXIDINE GLUCONATE CLOTH 2 % EX PADS
6.0000 | MEDICATED_PAD | Freq: Once | CUTANEOUS | Status: AC
  Administered 2017-06-28: 6 via TOPICAL

## 2017-06-28 MED ORDER — LABETALOL HCL 5 MG/ML IV SOLN
INTRAVENOUS | Status: AC
  Filled 2017-06-28: qty 4

## 2017-06-28 MED ORDER — MORPHINE SULFATE (PF) 4 MG/ML IV SOLN: 2.0000 mg | INTRAVENOUS | Status: DC | PRN

## 2017-06-28 MED ORDER — ACETAMINOPHEN 650 MG RE SUPP
650.0000 mg | RECTAL | Status: DC | PRN
  Filled 2017-06-28: qty 1

## 2017-06-28 MED ORDER — OXYCODONE HCL 5 MG PO TABS: 5.0000 mg | ORAL_TABLET | ORAL | Status: DC | PRN

## 2017-06-28 MED ORDER — MIDAZOLAM HCL 5 MG/5ML IJ SOLN
INTRAMUSCULAR | Status: DC | PRN
  Administered 2017-06-28: 2 mg via INTRAVENOUS

## 2017-06-28 MED ORDER — BUPIVACAINE HCL (PF) 0.25 % IJ SOLN
INTRAMUSCULAR | Status: AC
  Filled 2017-06-28: qty 30

## 2017-06-28 MED ORDER — PROPOFOL 10 MG/ML IV BOLUS
INTRAVENOUS | Status: DC | PRN
  Administered 2017-06-28: 200 mg via INTRAVENOUS

## 2017-06-28 MED ORDER — ROCURONIUM BROMIDE 10 MG/ML (PF) SYRINGE
PREFILLED_SYRINGE | INTRAVENOUS | Status: AC
  Filled 2017-06-28: qty 5

## 2017-06-28 MED ORDER — ONDANSETRON HCL 4 MG/2ML IJ SOLN
INTRAMUSCULAR | Status: AC
  Filled 2017-06-28: qty 2

## 2017-06-28 SURGICAL SUPPLY — 35 items
APPLIER CLIP 5 13 M/L LIGAMAX5 (MISCELLANEOUS)
BENZOIN TINCTURE PRP APPL 2/3 (GAUZE/BANDAGES/DRESSINGS) ×3 IMPLANT
CABLE HIGH FREQUENCY MONO STRZ (ELECTRODE) ×3 IMPLANT
CHLORAPREP W/TINT 26ML (MISCELLANEOUS) ×3 IMPLANT
CLIP APPLIE 5 13 M/L LIGAMAX5 (MISCELLANEOUS) IMPLANT
CLIP VESOLOCK MED LG 6/CT (CLIP) ×6 IMPLANT
CLOSURE WOUND 1/2 X4 (GAUZE/BANDAGES/DRESSINGS) ×1
COVER MAYO STAND STRL (DRAPES) IMPLANT
COVER TRANSDUCER ULTRASND (DRAPES) ×3 IMPLANT
DECANTER SPIKE VIAL GLASS SM (MISCELLANEOUS) ×3 IMPLANT
DEVICE TROCAR PUNCTURE CLOSURE (ENDOMECHANICALS) ×3 IMPLANT
DRAPE C-ARM 42X120 X-RAY (DRAPES) IMPLANT
ELECT REM PT RETURN 15FT ADLT (MISCELLANEOUS) ×3 IMPLANT
GAUZE SPONGE 2X2 8PLY STRL LF (GAUZE/BANDAGES/DRESSINGS) ×1 IMPLANT
GLOVE BIO SURGEON STRL SZ7.5 (GLOVE) ×3 IMPLANT
GOWN STRL REUS W/TWL XL LVL3 (GOWN DISPOSABLE) ×6 IMPLANT
HEMOSTAT SURGICEL 4X8 (HEMOSTASIS) IMPLANT
KIT BASIN OR (CUSTOM PROCEDURE TRAY) ×3 IMPLANT
NEEDLE INSUFFLATION 14GA 120MM (NEEDLE) ×3 IMPLANT
POUCH RETRIEVAL ECOSAC 10 (ENDOMECHANICALS) IMPLANT
POUCH RETRIEVAL ECOSAC 10MM (ENDOMECHANICALS)
SCISSORS LAP 5X35 DISP (ENDOMECHANICALS) ×3 IMPLANT
SET CHOLANGIOGRAPH MIX (MISCELLANEOUS) IMPLANT
SET IRRIG TUBING LAPAROSCOPIC (IRRIGATION / IRRIGATOR) ×3 IMPLANT
SLEEVE XCEL OPT CAN 5 100 (ENDOMECHANICALS) ×3 IMPLANT
SPONGE GAUZE 2X2 STER 10/PKG (GAUZE/BANDAGES/DRESSINGS) ×2
STRIP CLOSURE SKIN 1/2X4 (GAUZE/BANDAGES/DRESSINGS) ×2 IMPLANT
SUT MNCRL AB 4-0 PS2 18 (SUTURE) ×3 IMPLANT
TAPE CLOTH SURG 4X10 WHT LF (GAUZE/BANDAGES/DRESSINGS) ×3 IMPLANT
TOWEL OR 17X26 10 PK STRL BLUE (TOWEL DISPOSABLE) ×3 IMPLANT
TOWEL OR NON WOVEN STRL DISP B (DISPOSABLE) ×3 IMPLANT
TRAY LAPAROSCOPIC (CUSTOM PROCEDURE TRAY) ×3 IMPLANT
TROCAR BLADELESS OPT 5 100 (ENDOMECHANICALS) ×3 IMPLANT
TROCAR XCEL NON-BLD 11X100MML (ENDOMECHANICALS) ×3 IMPLANT
TUBING INSUF HEATED (TUBING) ×3 IMPLANT

## 2017-06-28 NOTE — Anesthesia Procedure Notes (Deleted)
Performed by: Talbot Grumbling, CRNA

## 2017-06-28 NOTE — Anesthesia Procedure Notes (Addendum)
Procedure Name: Intubation Date/Time: 06/28/2017 12:07 PM Performed by: Talbot Grumbling, CRNA Pre-anesthesia Checklist: Patient identified, Patient being monitored, Timeout performed, Emergency Drugs available and Suction available Patient Re-evaluated:Patient Re-evaluated prior to induction Oxygen Delivery Method: Circle System Utilized Preoxygenation: Pre-oxygenation with 100% oxygen Induction Type: IV induction Ventilation: Oral airway inserted - appropriate to patient size and Two handed mask ventilation required Laryngoscope Size: Mac and 3 Grade View: Grade I Tube type: Oral Tube size: 8.0 mm Number of attempts: 1 Airway Equipment and Method: stylet Placement Confirmation: ETT inserted through vocal cords under direct vision,  positive ETCO2 and breath sounds checked- equal and bilateral Secured at: 21 cm Tube secured with: Tape Dental Injury: Teeth and Oropharynx as per pre-operative assessment  Comments: Placed by Dene Gentry SRNA

## 2017-06-28 NOTE — Discharge Instructions (Signed)
[facility] _______[facility] ° °HERNIA REPAIR: POST OP INSTRUCTIONS ° °Always review your discharge instruction sheet given to you by the facility where your surgery was performed. °IF YOU HAVE DISABILITY OR FAMILY LEAVE FORMS, YOU MUST BRING THEM TO THE OFFICE FOR PROCESSING.   °DO NOT GIVE THEM TO YOUR DOCTOR. ° °1. A  prescription for pain medication may be given to you upon discharge.  Take your pain medication as prescribed, if needed.  If narcotic pain medicine is not needed, then you may take acetaminophen (Tylenol) or ibuprofen (Advil) as needed. °2. Take your usually prescribed medications unless otherwise directed. °If you need a refill on your pain medication, please contact your pharmacy.  They will contact our office to request authorization. Prescriptions will not be filled after 5 pm or on week-ends. °3. You should follow a light diet the first 24 hours after arrival home, such as soup and crackers, etc.  Be sure to include lots of fluids daily.  Resume your normal diet the day after surgery. °4.Most patients will experience some swelling and bruising around the umbilicus or in the groin and scrotum.  Ice packs and reclining will help.  Swelling and bruising can take several days to resolve.  °6. It is common to experience some constipation if taking pain medication after surgery.  Increasing fluid intake and taking a stool softener (such as Colace) will usually help or prevent this problem from occurring.  A mild laxative (Milk of Magnesia or Miralax) should be taken according to package directions if there are no bowel movements after 48 hours. °7. Unless discharge instructions indicate otherwise, you may remove your bandages 24-48 hours after surgery, and you may shower at that time.  You may have steri-strips (small skin tapes) in place directly over the incision.  These strips should be left on the skin for 7-10 days.  If your surgeon used skin glue on the incision, you may shower in  24 hours.  The glue will flake off over the next 2-3 weeks.  Any sutures or staples will be removed at the office during your follow-up visit. °8. ACTIVITIES:  You may resume regular (light) daily activities beginning the next day--such as daily self-care, walking, climbing stairs--gradually increasing activities as tolerated.  You may have sexual intercourse when it is comfortable.  Refrain from any heavy lifting or straining until approved by your doctor. ° °a.You may drive when you are no longer taking prescription pain medication, you can comfortably wear a seatbelt, and you can safely maneuver your car and apply brakes. °b.RETURN TO WORK:   °_____________________________________________ ° °9.You should see your doctor in the office for a follow-up appointment approximately 2-3 weeks after your surgery.  Make sure that you call for this appointment within a day or two after you arrive home to insure a convenient appointment time. °10.OTHER INSTRUCTIONS: _________________________ °   _____________________________________ ° °WHEN TO CALL YOUR DOCTOR: °1. Fever over 101.0 °2. Inability to urinate °3. Nausea and/or vomiting °4. Extreme swelling or bruising °5. Continued bleeding from incision. °6. Increased pain, redness, or drainage from the incision ° °The clinic staff is available to answer your questions during regular business hours.  Please don’t hesitate to call and ask to speak to one of the nurses for clinical concerns.  If you have a medical emergency, go to the nearest emergency room or call 911.  A surgeon from [facility] is always on call at the hospital ° ° °[address], [address], [place], [place]  [address] ? ° [address] [address], [place], [place]   [address] °[phone] ? [phone] ? FAX [phone] °Web site: [email or web address] ° °

## 2017-06-28 NOTE — Transfer of Care (Signed)
Immediate Anesthesia Transfer of Care Note  Patient: Brandon Carroll  Procedure(s) Performed: LAPAROSCOPIC CHOLECYSTECTOMY (N/A Abdomen)  Patient Location: PACU  Anesthesia Type:General  Level of Consciousness: sedated  Airway & Oxygen Therapy: Patient Spontanous Breathing and Patient connected to face mask oxygen  Post-op Assessment: Report given to RN and Post -op Vital signs reviewed and stable  Post vital signs: Reviewed and stable  Last Vitals:  Vitals:   06/28/17 1002  BP: (!) 167/98  Pulse: 69  Resp: 16  Temp: 37.2 C  SpO2: 100%    Last Pain:  Vitals:   06/28/17 1002  TempSrc: Oral         Complications: No apparent anesthesia complications

## 2017-06-28 NOTE — Op Note (Signed)
06/28/2017  12:51 PM  PATIENT:  Brandon Carroll  57 y.o. male  PRE-OPERATIVE DIAGNOSIS:  gallstones  POST-OPERATIVE DIAGNOSIS:  gallstones  PROCEDURE:  Procedure(s): LAPAROSCOPIC CHOLECYSTECTOMY (N/A)  SURGEON:  Surgeon(s) and Role:    * Ralene Ok, MD - Primary  ANESTHESIA:   local and general  EBL:  10 mL   BLOOD ADMINISTERED:none  DRAINS: none   LOCAL MEDICATIONS USED:  BUPIVICAINE   SPECIMEN:  Source of Specimen:  gallbladder  DISPOSITION OF SPECIMEN:  PATHOLOGY  COUNTS:  YES  TOURNIQUET:  * No tourniquets in log *  DICTATION: .Dragon Dictation   EBL: <1JS   Complications: none   Counts: reported as correct x 2   Findings:chronically inflamed gallbladder  Indications for procedure: Pt is a 57 y/o M  with RUQ pain and seen to have gallstones.   Details of the procedure: The patient was taken to the operating and placed in the supine position with bilateral SCDs in place. A time out was called and all facts were verified. A pneumoperitoneum was obtained via A Veress needle technique to a pressure of 37mm of mercury. A 58mm trochar was then placed in the right upper quadrant under visualization, and there were no injuries to any abdominal organs. A 11 mm port was then placed in the umbilical region after infiltrating with local anesthesia under direct visualization. A second epigastric port was placed under direct visualization.   The gallbladder was identified and retracted, the peritoneum was then sharply dissected from the gallbladder and this dissection was carried down to Calot's triangle. The cystic duct was identified and dissected circumferentially and seen going into the gallbladder 360.  The cystic artery was dissected away from the surrounding tissues.   The critical angle was obtained.    2 clips were placed proximally one distally and the cystic duct transected. The cystic artery was identified and 2 clips placed proximally and one distally and  transected. We then proceeded to remove the gallbladder off the hepatic fossa with Bovie cautery. A retrieval bag was then placed in the abdomen and gallbladder placed in the bag. The hepatic fossa was then reexamined and hemostasis was achieved with Bovie cautery and was excellent at this portion of the case. The subhepatic fossa and perihepatic fossa was then irrigated until the effluent was clear. The specimen bag and specimen were removed from the abdominal cavity.  The 11 mm trocar fascia was reapproximated with the Endo Close #1 Vicryl x1. The pneumoperitoneum was evacuated and all trochars removed under direct visulalization. The skin was then closed with 4-0 Monocryl and the skin dressed with Steri-Strips, gauze, and tape. The patient was awaken from general anesthesia and taken to the recovery room in stable condition.    PLAN OF CARE: Discharge to home after PACU  PATIENT DISPOSITION:  PACU - hemodynamically stable.   Delay start of Pharmacological VTE agent (>24hrs) due to surgical blood loss or risk of bleeding: not applicable

## 2017-06-28 NOTE — Progress Notes (Signed)
Discharge note----pt has voided QS, up to ambulate without dizziness; able to tolerate fluids without nausea, pt has manageable pain and prescription for home use if needed; voiced understanding discharge instructions

## 2017-06-28 NOTE — H&P (Signed)
History of Present Illness  The patient is a 57 year old male who presents for evaluation of gall stones. Referred by: Daphane Shepherd, PA Chief Complaint: Right upper quadrant abdominal pain  Patient is a 56 year old male, with a history of diabetes, hypercholesterolemia, hypertension, with approximately 2 year history of abdominal pain to the right upper quadrant. He states that this is associated with high fatty meals. He states the pain is fairly sharp last approximately 15-20 minutes. He states he does have some associated nausea vomiting. He states that after the bouts of emesis he does feel somewhat better.  Patient did have a ultrasound in February 2017 which revealed a 3.6 cm gallbladder polyp. There were no stones seen at that time. I did review the study personally    Past Surgical History ( No pertinent past surgical history   Diagnostic Studies History  Colonoscopy  1-5 years ago  Allergies ( Lipitor *ANTIHYPERLIPIDEMICS*   Medication History AmLODIPine Besylate (5MG  Tablet, Oral) Active. Carvedilol (25MG  Tablet, Oral) Active. Furosemide (20MG  Tablet, Oral) Active. Losartan Potassium (100MG  Tablet, Oral) Active. Crestor (20MG  Tablet, Oral) Active. Lantus (100UNIT/ML Solution, Subcutaneous) Active. Medications Reconciled  Social History Alcohol use  Occasional alcohol use. Caffeine use  Coffee. No drug use  Tobacco use  Never smoker.  Family History  Diabetes Mellitus  Mother. Hypertension  Brother, Father, Sister.  Other Problems  Diabetes Mellitus  High blood pressure     Review of Systems  General Not Present- Appetite Loss, Chills, Fatigue, Fever, Night Sweats, Weight Gain and Weight Loss. HEENT Not Present- Earache, Hearing Loss, Hoarseness, Nose Bleed, Oral Ulcers, Ringing in the Ears, Seasonal Allergies, Sinus Pain, Sore Throat, Visual Disturbances, Wears glasses/contact lenses and Yellow Eyes. Respiratory Not  Present- Cough and Difficulty Breathing. Breast Not Present- Breast Mass, Breast Pain, Nipple Discharge and Skin Changes. Cardiovascular Not Present- Chest Pain, Difficulty Breathing Lying Down, Leg Cramps, Palpitations, Rapid Heart Rate, Shortness of Breath and Swelling of Extremities. Gastrointestinal Not Present- Abdominal Pain, Bloating, Bloody Stool, Change in Bowel Habits, Chronic diarrhea, Constipation, Difficulty Swallowing, Excessive gas, Gets full quickly at meals, Hemorrhoids, Indigestion, Nausea, Rectal Pain and Vomiting. Male Genitourinary Not Present- Blood in Urine, Change in Urinary Stream, Frequency, Impotence, Nocturia, Painful Urination, Urgency and Urine Leakage. Musculoskeletal Not Present- Back Pain, Joint Pain, Joint Stiffness, Muscle Pain, Muscle Weakness and Swelling of Extremities. Neurological Not Present- Weakness. All other systems negative  BP (!) 167/98   Pulse 69   Temp 99 F (37.2 C) (Oral)   Resp 16   Ht 5\' 9"  (1.753 m)   Wt 86.6 kg (191 lb)   SpO2 100%   BMI 28.21 kg/m       Physical Exam  The physical exam findings are as follows: Note:Constitutional: No acute distress, conversant, appears stated age  Eyes: Anicteric sclerae, moist conjunctiva, no lid lag  Neck: No thyromegaly, trachea midline, no cervical lymphadenopathy  Lungs: Clear to auscultation biilaterally, normal respiratory effot  Cardiovascular: regular rate & rhythm, no murmurs, no peripheal edema, pedal pulses 2+  GI: Soft, no masses or hepatosplenomegaly, non-tender to palpation  MSK: Normal gait, no clubbing cyanosis, edema  Skin: No rashes, palpation reveals normal skin turgor  Psychiatric: Appropriate judgment and insight, oriented to person, place, and time   Assessment & Plan  GALLBLADDER POLYP (K82.4) Impression: Patient is a 57 year old male with a history of hypertension, hypercholesterolemia, diabetes comes in with a large gallbladder polyp as  well as what appears to be symptomatic cholelithiasis.  1.  We will proceed to the operating room for a laparoscopic cholecystectomy  2. Risks and benefits were discussed with the patient to generally include, but not limited to: infection, bleeding, possible need for post op ERCP, damage to the bile ducts, bile leak, and possible need for further surgery. Alternatives were offered and described. All questions were answered and the patient voiced understanding of the procedure and wishes to proceed at this point with a laparoscopic cholecystectomy

## 2017-06-28 NOTE — Anesthesia Preprocedure Evaluation (Signed)
Anesthesia Evaluation  Patient identified by MRN, date of birth, ID band Patient awake    Reviewed: Allergy & Precautions, H&P , NPO status , Patient's Chart, lab work & pertinent test results  Airway Mallampati: II   Neck ROM: full    Dental   Pulmonary shortness of breath,    breath sounds clear to auscultation       Cardiovascular hypertension, +CHF   Rhythm:regular Rate:Normal     Neuro/Psych    GI/Hepatic   Endo/Other  diabetes, Type 2  Renal/GU Renal InsufficiencyRenal disease     Musculoskeletal   Abdominal   Peds  Hematology  (+) anemia ,   Anesthesia Other Findings   Reproductive/Obstetrics                             Anesthesia Physical Anesthesia Plan  ASA: II  Anesthesia Plan: General   Post-op Pain Management:    Induction: Intravenous  PONV Risk Score and Plan: 2 and Treatment may vary due to age or medical condition, Ondansetron, Dexamethasone and Midazolam  Airway Management Planned: Oral ETT  Additional Equipment:   Intra-op Plan:   Post-operative Plan: Extubation in OR  Informed Consent: I have reviewed the patients History and Physical, chart, labs and discussed the procedure including the risks, benefits and alternatives for the proposed anesthesia with the patient or authorized representative who has indicated his/her understanding and acceptance.     Plan Discussed with: CRNA, Anesthesiologist and Surgeon  Anesthesia Plan Comments:         Anesthesia Quick Evaluation

## 2017-07-01 ENCOUNTER — Encounter (HOSPITAL_COMMUNITY): Payer: Self-pay | Admitting: General Surgery

## 2017-07-01 NOTE — Anesthesia Postprocedure Evaluation (Signed)
Anesthesia Post Note  Patient: Brandon Carroll  Procedure(s) Performed: LAPAROSCOPIC CHOLECYSTECTOMY (N/A Abdomen)     Patient location during evaluation: PACU Anesthesia Type: General Level of consciousness: awake and alert Pain management: pain level controlled Vital Signs Assessment: post-procedure vital signs reviewed and stable Respiratory status: spontaneous breathing, nonlabored ventilation, respiratory function stable and patient connected to nasal cannula oxygen Cardiovascular status: blood pressure returned to baseline and stable Postop Assessment: no apparent nausea or vomiting Anesthetic complications: no    Last Vitals:  Vitals:   06/28/17 1456 06/28/17 1530  BP: (!) 172/97 (!) 180/80  Pulse: 70 76  Resp: 14 14  Temp: 36.8 C 36.7 C  SpO2: 92% 93%    Last Pain:  Vitals:   06/28/17 1530  TempSrc:   PainSc: [place]

## 2017-07-03 ENCOUNTER — Encounter: Payer: Self-pay | Admitting: Pulmonary Disease

## 2017-07-03 ENCOUNTER — Ambulatory Visit (INDEPENDENT_AMBULATORY_CARE_PROVIDER_SITE_OTHER): Payer: Managed Care, Other (non HMO) | Admitting: Pulmonary Disease

## 2017-07-03 VITALS — BP 130/82 | HR 80 | Temp 98.5°F | Ht 70.0 in | Wt 192.0 lb

## 2017-07-03 DIAGNOSIS — N183 Chronic kidney disease, stage 3 unspecified: Secondary | ICD-10-CM

## 2017-07-03 DIAGNOSIS — D869 Sarcoidosis, unspecified: Secondary | ICD-10-CM | POA: Diagnosis not present

## 2017-07-03 DIAGNOSIS — E0821 Diabetes mellitus due to underlying condition with diabetic nephropathy: Secondary | ICD-10-CM

## 2017-07-03 DIAGNOSIS — E1122 Type 2 diabetes mellitus with diabetic chronic kidney disease: Secondary | ICD-10-CM

## 2017-07-03 DIAGNOSIS — I429 Cardiomyopathy, unspecified: Secondary | ICD-10-CM | POA: Diagnosis not present

## 2017-07-03 DIAGNOSIS — I5022 Chronic systolic (congestive) heart failure: Secondary | ICD-10-CM | POA: Diagnosis not present

## 2017-07-03 DIAGNOSIS — D631 Anemia in chronic kidney disease: Secondary | ICD-10-CM

## 2017-07-03 DIAGNOSIS — I10 Essential (primary) hypertension: Secondary | ICD-10-CM

## 2017-07-03 DIAGNOSIS — E785 Hyperlipidemia, unspecified: Secondary | ICD-10-CM

## 2017-07-03 NOTE — Patient Instructions (Signed)
Today we updated your med list in our EPIC system...    Continue your current medications the same...  We discussed getting established w/ Endocrine/ DM- DrGherghe for diabetic control...    And re-establish w/ Nephrology- Renal DrUpton et al...    We will set these important appts up for you...  Call for any questions...  Let's plan a follow up visit in 59mo, sooner if needed for problems.Marland KitchenMarland Kitchen

## 2017-07-04 LAB — HM DIABETES EYE EXAM

## 2017-07-24 ENCOUNTER — Encounter: Payer: Self-pay | Admitting: Endocrinology

## 2017-07-24 ENCOUNTER — Ambulatory Visit (INDEPENDENT_AMBULATORY_CARE_PROVIDER_SITE_OTHER): Payer: Managed Care, Other (non HMO) | Admitting: Endocrinology

## 2017-07-24 VITALS — BP 160/100 | HR 81 | Wt 196.6 lb

## 2017-07-24 DIAGNOSIS — E1121 Type 2 diabetes mellitus with diabetic nephropathy: Secondary | ICD-10-CM | POA: Diagnosis not present

## 2017-07-24 DIAGNOSIS — E0821 Diabetes mellitus due to underlying condition with diabetic nephropathy: Secondary | ICD-10-CM

## 2017-07-24 MED ORDER — FREESTYLE LIBRE 14 DAY READER DEVI: 1.0000 | Freq: Once | 0 refills | Status: DC

## 2017-07-24 MED ORDER — INSULIN GLARGINE 100 UNIT/ML SOLOSTAR PEN: 60.0000 [IU] | PEN_INJECTOR | SUBCUTANEOUS | 99 refills | Status: DC

## 2017-07-24 MED ORDER — FREESTYLE LIBRE 14 DAY SENSOR MISC: 1.0000 | 3 refills | Status: DC

## 2017-07-24 NOTE — Patient Instructions (Addendum)
Your blood pressure is high today.  Please see your primary care provider soon, to have it rechecked I have sent a prescription to your pharmacy, to increase the lantus to 50 units each morning. On this type of insulin schedule, you should eat meals on a regular schedule.  If a meal is missed or significantly delayed, your blood sugar could go low.  check your blood sugar twice a day.  vary the time of day when you check, between before the 3 meals, and at bedtime.  also check if you have symptoms of your blood sugar being too high or too low.  please keep a record of the readings and bring it to your next appointment here (or you can bring the meter itself).  You can write it on any piece of paper.  please call us sooner if your blood sugar goes below 70, or if you have a lot of readings over 200.  I have also sent prescriptions to your pharmacy, fo the "[facility]" sensors and reader.   Please come back for a follow-up appointment in 2 months.

## 2017-07-24 NOTE — Progress Notes (Signed)
Subjective:    Patient ID: Brandon Carroll, male    DOB: 07-Aug-1960, 57 y.o.   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  Dx'ed: 5974 Complications: renal failure. Therapy: insulin since 2014.   DKA: never.  Severe hypoglycemia: never Pancreatitis: never.  Other: He works 1st shift, as a Forensic scientist; he is on QD insulin, due to h/o noncompliance.   Interval history: pt says he never misses the insulin.  no cbg record, but states cbg's are in the mid-100's fasting.  pt states he feels well in general. Past Medical History:  Diagnosis Date  . Acute systolic CHF (congestive heart failure), NYHA class 2 ([place]) 05/04/2015  . Acute-on-chronic kidney injury ([place]) 05/04/2015  . Diabetes mellitus   . Erectile dysfunction   . Hyperlipidemia   . Hypertension     Past Surgical History:  Procedure Laterality Date  . CARDIAC CATHETERIZATION N/A 06/15/2015   Procedure: Left Heart Cath and Coronary Angiography;  Surgeon: Peter M Martinique, MD;  Location: [facility];  Service: Cardiovascular;  Laterality: N/A;  . CHOLECYSTECTOMY N/A 06/28/2017   Procedure: LAPAROSCOPIC CHOLECYSTECTOMY;  Surgeon: Ralene Ok, MD;  Location: [facility];  Service: General;  Laterality: N/A;  . HERNIA REPAIR  7th grade   umbilical    Social History   Socioeconomic History  . Marital status: Divorced    Spouse name: n/a  . Number of children: 1  . Years of education: 12+  . Highest education level: Not on file  Social Needs  . Financial resource strain: Not on file  . Food insecurity - worry: Not on file  . Food insecurity - inability: Not on file  . Transportation needs - medical: Not on file  . Transportation needs - non-medical: Not on file  Occupational History  . Occupation: Building services engineer: [employer]  Tobacco Use  . Smoking status: Never Smoker  . Smokeless tobacco: Never Used  Substance  and Sexual Activity  . Alcohol use: No    Alcohol/week: 0.0 oz  . Drug use: No  . Sexual activity: Not Currently    Comment: Erectile dysfunciotn  Other Topics Concern  . Not on file  Social History Narrative   Divorced. Education: [employer].    Unable to exercise due to significant SOB and DOE.    Lives alone.   Son lives in [place].    Current Outpatient Medications on File Prior to Visit  Medication Sig Dispense Refill  . Albuterol Sulfate (PROAIR RESPICLICK) 163 (90 BASE) MCG/ACT AEPB Inhale 2 puffs into the lungs every 6 (six) hours as needed. 1 each 3  . aspirin EC 81 MG tablet Take 81 mg by mouth daily.    . BD PEN NEEDLE NANO U/F 32G X 4 MM MISC use once daily as directed 100 each PRN  . carvedilol (COREG) 25 MG tablet Take 1 tablet (25 mg total) 2 (two) times daily by mouth. 60 tablet 9  . fluticasone furoate-vilanterol (BREO ELLIPTA) 100-25 MCG/INH AEPB Inhale 1 puff into the lungs daily. 28 each 11  . glucose blood (ONETOUCH VERIO) test strip 1 each by Other route 2 (two) times daily as needed for other. And lancets 2/day 100 each 12  . losartan (COZAAR) 100 MG tablet Take 1 tablet (100 mg total) daily by mouth. 90 tablet 0  . rosuvastatin (CRESTOR) 20 MG tablet Take 1 tablet (20 mg  total) daily by mouth. 90 tablet 3  . Spacer/Aero-Holding Chambers (AEROCHAMBER MV) inhaler Use as instructed 1 each 0  . traMADol (ULTRAM) 50 MG tablet Take 1 tablet (50 mg total) by mouth every 6 (six) hours as needed. 20 tablet 0   No current facility-administered medications on file prior to visit.     Allergies  Allergen Reactions  . Lipitor [Atorvastatin] Hives and Itching    Family History  Problem Relation Age of Onset  . Arthritis Mother   . COPD Mother   . Diabetes Mother        was thin, took insulin  . Hypertension Mother   . Hypertension Father   . Arthritis Sister        rheumatoid  . COPD Sister   . Diabetes Brother   . Colon cancer Neg Hx     BP (!) 160/100 (BP  Location: Left Arm, Patient Position: Sitting, Cuff Size: Normal)   Pulse 81   Wt 196 lb 9.6 oz (89.2 kg)   SpO2 96%   BMI 28.21 kg/m   Review of Systems He denies hypoglycemia.      Objective:   Physical Exam VITAL SIGNS:  See vs page GENERAL: no distress Pulses: foot pulses are intact bilaterally.   MSK: no deformity of the feet or ankles.  CV: no edema of the legs or ankles Skin:  no ulcer on the feet or ankles.  normal color and temp on the feet and ankles Neuro: sensation is intact to touch on the feet and ankles.   Ext: There is bilateral onychomycosis of the toenails.    Lab Results  Component Value Date   HGBA1C 9.1 (H) 06/25/2017       Assessment & Plan:  HTN: is noted today. Insulin-requiring type 2 DM, with renal failure: he needs increased rx.   Patient Instructions  Your blood pressure is high today.  Please see your primary care provider soon, to have it rechecked I have sent a prescription to your pharmacy, to increase the lantus to 50 units each morning. On this type of insulin schedule, you should eat meals on a regular schedule.  If a meal is missed or significantly delayed, your blood sugar could go low.  check your blood sugar twice a day.  vary the time of day when you check, between before the 3 meals, and at bedtime.  also check if you have symptoms of your blood sugar being too high or too low.  please keep a record of the readings and bring it to your next appointment here (or you can bring the meter itself).  You can write it on any piece of paper.  please call us sooner if your blood sugar goes below 70, or if you have a lot of readings over 200.  I have also sent prescriptions to your pharmacy, fo the "[facility]" sensors and reader.   Please come back for a follow-up appointment in 2 months.

## 2017-07-25 ENCOUNTER — Encounter: Payer: Self-pay | Admitting: Internal Medicine

## 2017-07-25 ENCOUNTER — Ambulatory Visit (INDEPENDENT_AMBULATORY_CARE_PROVIDER_SITE_OTHER): Payer: Managed Care, Other (non HMO) | Admitting: Internal Medicine

## 2017-07-25 DIAGNOSIS — I5022 Chronic systolic (congestive) heart failure: Secondary | ICD-10-CM | POA: Diagnosis not present

## 2017-07-25 DIAGNOSIS — I10 Essential (primary) hypertension: Secondary | ICD-10-CM | POA: Diagnosis not present

## 2017-07-25 DIAGNOSIS — N184 Chronic kidney disease, stage 4 (severe): Secondary | ICD-10-CM

## 2017-07-25 DIAGNOSIS — E785 Hyperlipidemia, unspecified: Secondary | ICD-10-CM

## 2017-07-25 DIAGNOSIS — E0821 Diabetes mellitus due to underlying condition with diabetic nephropathy: Secondary | ICD-10-CM

## 2017-07-25 MED ORDER — AMLODIPINE BESYLATE 10 MG PO TABS: 10.0000 mg | ORAL_TABLET | Freq: Every day | ORAL | 3 refills | Status: DC

## 2017-07-25 MED ORDER — FUROSEMIDE 40 MG PO TABS
40.0000 mg | ORAL_TABLET | Freq: Every day | ORAL | 3 refills | Status: DC
Start: 2017-07-25 — End: 2018-05-20

## 2017-07-25 NOTE — Progress Notes (Signed)
   Subjective:    Patient ID: Brandon Carroll, male    DOB: 1960-11-10, 57 y.o.   MRN: [id number]  HPI The patient is a 57 YO man coming in new for blood pressure (taking amlodipine 5 mg BID, coreg 25 mg BID, losartan 100 mg daily, lasix 40 mg daily, CKD stage 4), and CKD stage 4 (last Cr 2.6, BP has been labile but now he is taking meds faithfully, also from 20 years of diabetes which is not ideally controlled), and systolic heart failure (on ARB, lasix, coreg, denies SOB now, some chronic cough) and sarcoid (seeing pulmonary, breo inhaler prn), and diabetes (taking insulin daily, last HgA1c 9.1 and poor control, seeing endo and wants to switch providers there, working on diet and exercise). He denies new problems. Recently had gallbladder removal and feels well from that. No more abdominal pain. Denies diarrhea, nausea, vomiting. Denies chest pain or tightness.   PMH, [facility], social history reviewed and updated  Review of Systems  Constitutional: Negative.   HENT: Negative.   Eyes: Negative.   Respiratory: Positive for cough. Negative for chest tightness and shortness of breath.   Cardiovascular: Negative for chest pain, palpitations and leg swelling.  Gastrointestinal: Negative for abdominal distention, abdominal pain, constipation, diarrhea, nausea and vomiting.  Musculoskeletal: Negative.   Skin: Negative.   Neurological: Positive for numbness.       Neuropathy bilateral legs  Psychiatric/Behavioral: Negative.       Objective:   Physical Exam  Constitutional: He is oriented to person, place, and time. He appears well-developed and well-nourished.  overweight  HENT:  Head: Normocephalic and atraumatic.  Eyes: EOM are normal.  Neck: Normal range of motion.  Cardiovascular: Normal rate and regular rhythm.  Carotids without bruit  Pulmonary/Chest: Effort normal and breath sounds normal. No respiratory distress. He has no wheezes. He has no rales.  Abdominal: Soft. Bowel sounds are  normal. He exhibits no distension. There is no tenderness. There is no rebound.  Musculoskeletal: He exhibits no edema.  Neurological: He is alert and oriented to person, place, and time. Coordination normal.  Skin: Skin is warm and dry.  Foot exam done  Psychiatric: He has a normal mood and affect.   Vitals:   07/25/17 0806 07/25/17 0840  BP: (!) 150/100 140/90  Pulse: 73   Temp: 98.2 F (36.8 C)   TempSrc: Oral   SpO2: 98%   Weight: 197 lb (89.4 kg)   Height: 5\' 10"  (1.778 m)       Assessment & Plan:

## 2017-07-25 NOTE — Patient Instructions (Addendum)
We have sent in amlodipine 10 mg tablets to take 1 pill daily instead of the 5 mg pills twice a day. You can take 2 pills a day until the 5 mg pills are out then switch.  We have sent in lasix (furosemide) 40 mg pills. This will be 1 pill daily instead of the 2 pills you are taking now. It is okay to use up the ones you have now first before switching.   Come back in about 2-3 months and we will check the labs then.    Diabetes Mellitus and Nutrition When you have diabetes (diabetes mellitus), it is very important to have healthy eating habits because your blood sugar (glucose) levels are greatly affected by what you eat and drink. Eating healthy foods in the appropriate amounts, at about the same times every day, can help you:  Control your blood glucose.  Lower your risk of heart disease.  Improve your blood pressure.  Reach or maintain a healthy weight.  Every person with diabetes is different, and each person has different needs for a meal plan. Your health care provider may recommend that you work with a diet and nutrition specialist (dietitian) to make a meal plan that is best for you. Your meal plan may vary depending on factors such as:  The calories you need.  The medicines you take.  Your weight.  Your blood glucose, blood pressure, and cholesterol levels.  Your activity level.  Other health conditions you have, such as heart or kidney disease.  How do carbohydrates affect me? Carbohydrates affect your blood glucose level more than any other type of food. Eating carbohydrates naturally increases the amount of glucose in your blood. Carbohydrate counting is a method for keeping track of how many carbohydrates you eat. Counting carbohydrates is important to keep your blood glucose at a healthy level, especially if you use insulin or take certain oral diabetes medicines. It is important to know how many carbohydrates you can safely have in each meal. This is different for  every person. Your dietitian can help you calculate how many carbohydrates you should have at each meal and for snack. Foods that contain carbohydrates include:  Bread, cereal, rice, pasta, and crackers.  Potatoes and corn.  Peas, beans, and lentils.  Milk and yogurt.  Fruit and juice.  Desserts, such as cakes, cookies, ice cream, and candy.  How does alcohol affect me? Alcohol can cause a sudden decrease in blood glucose (hypoglycemia), especially if you use insulin or take certain oral diabetes medicines. Hypoglycemia can be a life-threatening condition. Symptoms of hypoglycemia (sleepiness, dizziness, and confusion) are similar to symptoms of having too much alcohol. If your health care provider says that alcohol is safe for you, follow these guidelines:  Limit alcohol intake to no more than 1 drink per day for nonpregnant women and 2 drinks per day for men. One drink equals 12 oz of beer, 5 oz of wine, or 1 oz of hard liquor.  Do not drink on an empty stomach.  Keep yourself hydrated with water, diet soda, or unsweetened iced tea.  Keep in mind that regular soda, juice, and other mixers may contain a lot of sugar and must be counted as carbohydrates.  What are tips for following this plan? Reading food labels  Start by checking the serving size on the label. The amount of calories, carbohydrates, fats, and other nutrients listed on the label are based on one serving of the food. Many foods contain more  than one serving per package.  Check the total grams (g) of carbohydrates in one serving. You can calculate the number of servings of carbohydrates in one serving by dividing the total carbohydrates by 15. For example, if a food has 30 g of total carbohydrates, it would be equal to 2 servings of carbohydrates.  Check the number of grams (g) of saturated and trans fats in one serving. Choose foods that have low or no amount of these fats.  Check the number of milligrams (mg) of  sodium in one serving. Most people should limit total sodium intake to less than 2,300 mg per day.  Always check the nutrition information of foods labeled as "low-fat" or "nonfat". These foods may be higher in added sugar or refined carbohydrates and should be avoided.  Talk to your dietitian to identify your daily goals for nutrients listed on the label. Shopping  Avoid buying canned, premade, or processed foods. These foods tend to be high in fat, sodium, and added sugar.  Shop around the outside edge of the grocery store. This includes fresh fruits and vegetables, bulk grains, fresh meats, and fresh dairy. Cooking  Use low-heat cooking methods, such as baking, instead of high-heat cooking methods like deep frying.  Cook using healthy oils, such as olive, canola, or sunflower oil.  Avoid cooking with butter, cream, or high-fat meats. Meal planning  Eat meals and snacks regularly, preferably at the same times every day. Avoid going long periods of time without eating.  Eat foods high in fiber, such as fresh fruits, vegetables, beans, and whole grains. Talk to your dietitian about how many servings of carbohydrates you can eat at each meal.  Eat 4-6 ounces of lean protein each day, such as lean meat, chicken, fish, eggs, or tofu. 1 ounce is equal to 1 ounce of meat, chicken, or fish, 1 egg, or 1/4 cup of tofu.  Eat some foods each day that contain healthy fats, such as avocado, nuts, seeds, and fish. Lifestyle   Check your blood glucose regularly.  Exercise at least 30 minutes 5 or more days each week, or as told by your health care provider.  Take medicines as told by your health care provider.  Do not use any products that contain nicotine or tobacco, such as cigarettes and e-cigarettes. If you need help quitting, ask your health care provider.  Work with a Social worker or diabetes educator to identify strategies to manage stress and any emotional and social challenges. What are  some questions to ask my health care provider?  Do I need to meet with a diabetes educator?  Do I need to meet with a dietitian?  What number can I call if I have questions?  When are the best times to check my blood glucose? Where to find more information:  American Diabetes Association: diabetes.org/food-and-fitness/food  Academy of Nutrition and Dietetics: PokerClues.dk  [employer] of Diabetes and Digestive and Kidney Diseases (NIH): ContactWire.be Summary  A healthy meal plan will help you control your blood glucose and maintain a healthy lifestyle.  Working with a diet and nutrition specialist (dietitian) can help you make a meal plan that is best for you.  Keep in mind that carbohydrates and alcohol have immediate effects on your blood glucose levels. It is important to count carbohydrates and to use alcohol carefully. This information is not intended to replace advice given to you by your health care provider. Make sure you discuss any questions you have with your health care  provider. Document Released: 01/25/2005 Document Revised: 06/04/2016 Document Reviewed: 06/04/2016 Elsevier Interactive Patient Education  [employer].

## 2017-07-25 NOTE — Assessment & Plan Note (Signed)
Declined labs today, will check at next visit given that he started crestor and is now taking well.

## 2017-07-25 NOTE — Assessment & Plan Note (Signed)
Seeing endo and on lantus. Wants to switch providers and encouraged him to find someone he can feel engaged with. Given information about diet and he will work on meal planning and exercise as well as seeing endo.

## 2017-07-25 NOTE — Assessment & Plan Note (Signed)
BP above goal today. Will simplify regimen with amlodipine 10 mg daily, lasix 40 mg daily, losartan 100 mg daily, coreg 25 mg BID. Will see back soon and add medication if needed.

## 2017-07-25 NOTE — Assessment & Plan Note (Signed)
Due to diabetes/hypertension. Seeing nephrology and encouraged to continue. BP is above ideal goal and making some changes to encourage compliance today. Will see back quickly and adjust if needed then for goal <130/80.

## 2017-07-25 NOTE — Assessment & Plan Note (Signed)
Last EF 40-45%, on losartan, coreg, lasix. Does not appear to be volume overloaded. Likely from blood pressure or sarcoid. Cath done in 2017 without significant stenosis.

## 2017-07-31 ENCOUNTER — Other Ambulatory Visit: Payer: Self-pay | Admitting: Nephrology

## 2017-07-31 DIAGNOSIS — N189 Chronic kidney disease, unspecified: Secondary | ICD-10-CM

## 2017-07-31 DIAGNOSIS — N183 Chronic kidney disease, stage 3 (moderate): Principal | ICD-10-CM

## 2017-07-31 DIAGNOSIS — N1832 Chronic kidney disease, stage 3b: Secondary | ICD-10-CM

## 2017-07-31 DIAGNOSIS — D631 Anemia in chronic kidney disease: Secondary | ICD-10-CM

## 2017-08-08 ENCOUNTER — Encounter: Payer: Self-pay | Admitting: Internal Medicine

## 2017-08-13 ENCOUNTER — Ambulatory Visit
Admission: RE | Admit: 2017-08-13 | Discharge: 2017-08-13 | Disposition: A | Discharge status: Home / Self Care | Payer: Managed Care, Other (non HMO) | Source: Ambulatory Visit | Attending: Nephrology | Admitting: Nephrology

## 2017-08-13 DIAGNOSIS — N189 Chronic kidney disease, unspecified: Secondary | ICD-10-CM

## 2017-08-13 DIAGNOSIS — N183 Chronic kidney disease, stage 3 (moderate): Principal | ICD-10-CM

## 2017-08-13 DIAGNOSIS — N1832 Chronic kidney disease, stage 3b: Secondary | ICD-10-CM

## 2017-08-13 DIAGNOSIS — D631 Anemia in chronic kidney disease: Secondary | ICD-10-CM

## 2017-08-16 ENCOUNTER — Other Ambulatory Visit: Payer: Self-pay | Admitting: Physician Assistant

## 2017-08-16 DIAGNOSIS — I10 Essential (primary) hypertension: Secondary | ICD-10-CM

## 2017-09-17 ENCOUNTER — Ambulatory Visit: Payer: BLUE CROSS/BLUE SHIELD | Admitting: Physician Assistant

## 2017-09-23 ENCOUNTER — Ambulatory Visit (INDEPENDENT_AMBULATORY_CARE_PROVIDER_SITE_OTHER): Payer: Managed Care, Other (non HMO) | Admitting: Endocrinology

## 2017-09-23 ENCOUNTER — Encounter: Payer: Self-pay | Admitting: Endocrinology

## 2017-09-23 VITALS — BP 138/84 | HR 73 | Wt 195.0 lb

## 2017-09-23 DIAGNOSIS — E1122 Type 2 diabetes mellitus with diabetic chronic kidney disease: Secondary | ICD-10-CM | POA: Diagnosis not present

## 2017-09-23 DIAGNOSIS — E0821 Diabetes mellitus due to underlying condition with diabetic nephropathy: Secondary | ICD-10-CM

## 2017-09-23 DIAGNOSIS — N184 Chronic kidney disease, stage 4 (severe): Secondary | ICD-10-CM

## 2017-09-23 LAB — POCT GLYCOSYLATED HEMOGLOBIN (HGB A1C): Hemoglobin A1C: 8.7

## 2017-09-23 MED ORDER — INSULIN GLARGINE 100 UNIT/ML SOLOSTAR PEN: 60.0000 [IU] | PEN_INJECTOR | SUBCUTANEOUS | 99 refills | Status: DC

## 2017-09-23 NOTE — Progress Notes (Addendum)
Subjective:    Patient ID: Brandon Carroll, male    DOB: 08-28-1960, 57 y.o.   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   Dx'ed: 1829 Complications: renal failure. Therapy: insulin since 2014.   DKA: never.  Severe hypoglycemia: never.  Pancreatitis: never.  Other: He works 1st shift, as a Forensic scientist; he is on QD insulin, due to h/o noncompliance.   Interval history: pt says he never misses the insulin.  no cbg record, but states cbg's are in the high-100's fasting.  pt states he feels well in general.  He takes 50 units qd.   Past Medical History:  Diagnosis Date  . Acute systolic CHF (congestive heart failure), NYHA class 2 ([place]) 05/04/2015  . Acute-on-chronic kidney injury ([place]) 05/04/2015  . Diabetes mellitus   . Erectile dysfunction   . Hyperlipidemia   . Hypertension     Past Surgical History:  Procedure Laterality Date  . CARDIAC CATHETERIZATION N/A 06/15/2015   Procedure: Left Heart Cath and Coronary Angiography;  Surgeon: Peter M Martinique, MD;  Location: [facility];  Service: Cardiovascular;  Laterality: N/A;  . CHOLECYSTECTOMY N/A 06/28/2017   Procedure: LAPAROSCOPIC CHOLECYSTECTOMY;  Surgeon: Ralene Ok, MD;  Location: [facility];  Service: General;  Laterality: N/A;  . HERNIA REPAIR  7th grade   umbilical    Social History   Socioeconomic History  . Marital status: Divorced    Spouse name: n/a  . Number of children: 1  . Years of education: 12+  . Highest education level: Not on file  Occupational History  . Occupation: Building services engineer: [employer]  Social Needs  . Financial resource strain: Not on file  . Food insecurity:    Worry: Not on file    Inability: Not on file  . Transportation needs:    Medical: Not on file    Non-medical: Not on file  Tobacco Use  . Smoking status: Never Smoker  . Smokeless tobacco: Never Used  Substance and  Sexual Activity  . Alcohol use: No    Alcohol/week: 0.0 oz  . Drug use: No  . Sexual activity: Not Currently    Comment: Erectile dysfunciotn  Lifestyle  . Physical activity:    Days per week: Not on file    Minutes per session: Not on file  . Stress: Not on file  Relationships  . Social connections:    Talks on phone: Not on file    Gets together: Not on file    Attends religious service: Not on file    Active member of club or organization: Not on file    Attends meetings of clubs or organizations: Not on file    Relationship status: Not on file  . Intimate partner violence:    Fear of current or ex partner: Not on file    Emotionally abused: Not on file    Physically abused: Not on file    Forced sexual activity: Not on file  Other Topics Concern  . Not on file  Social History Narrative   Divorced. Education: [employer].    Unable to exercise due to significant SOB and DOE.    Lives alone.   Son lives in [place].    Current Outpatient Medications on File Prior to Visit  Medication Sig Dispense Refill  . Albuterol Sulfate (PROAIR RESPICLICK) 937 (90 BASE) MCG/ACT AEPB Inhale 2 puffs  into the lungs every 6 (six) hours as needed. 1 each 3  . amLODipine (NORVASC) 10 MG tablet Take 1 tablet (10 mg total) by mouth daily. 90 tablet 3  . aspirin EC 81 MG tablet Take 81 mg by mouth daily.    . BD PEN NEEDLE NANO U/F 32G X 4 MM MISC use once daily as directed 100 each PRN  . carvedilol (COREG) 25 MG tablet Take 1 tablet (25 mg total) 2 (two) times daily by mouth. 60 tablet 9  . Continuous Blood Gluc Sensor (FREESTYLE LIBRE 14 DAY SENSOR) MISC 1 Device by Does not apply route every 14 (fourteen) days. 6 each 3  . fluticasone furoate-vilanterol (BREO ELLIPTA) 100-25 MCG/INH AEPB Inhale 1 puff into the lungs daily. 28 each 11  . furosemide (LASIX) 40 MG tablet Take 1 tablet (40 mg total) by mouth daily. 90 tablet 3  . glucose blood (ONETOUCH VERIO) test strip 1 each by Other route 2  (two) times daily as needed for other. And lancets 2/day 100 each 12  . losartan (COZAAR) 100 MG tablet TAKE 1 TABLET (100 MG TOTAL) DAILY BY MOUTH. 90 tablet 0  . rosuvastatin (CRESTOR) 20 MG tablet Take 1 tablet (20 mg total) daily by mouth. 90 tablet 3  . Spacer/Aero-Holding Chambers (AEROCHAMBER MV) inhaler Use as instructed 1 each 0  . traMADol (ULTRAM) 50 MG tablet Take 1 tablet (50 mg total) by mouth every 6 (six) hours as needed. 20 tablet 0   No current facility-administered medications on file prior to visit.     Allergies  Allergen Reactions  . Lipitor [Atorvastatin] Hives and Itching    Family History  Problem Relation Age of Onset  . Arthritis Mother   . COPD Mother   . Diabetes Mother        was thin, took insulin  . Hypertension Mother   . Hypertension Father   . Arthritis Sister        rheumatoid  . COPD Sister   . Diabetes Brother   . Colon cancer Neg Hx     BP 138/84   Pulse 73   Wt 195 lb (88.5 kg)   SpO2 97%   BMI 27.98 kg/m    Review of Systems He denies hypoglycemia.     Objective:   Physical Exam VITAL SIGNS:  See vs page GENERAL: no distress Pulses: dorsalis pedis intact bilat.   MSK: no deformity of the feet CV: no leg edema Skin:  no ulcer on the feet, but the skin is dry.  normal color and temp on the feet. Neuro: sensation is intact to touch on the feet Ext: There is bilateral onychomycosis of the toenails.  Lab Results  Component Value Date   HGBA1C 8.7 09/23/2017       Assessment & Plan:   Insulin-requiring type 2 DM, with stage 4 CKD: worse   Patient Instructions  I have sent a prescription to your pharmacy, to increase the lantus to 60 units each morning. On this type of insulin schedule, you should eat meals on a regular schedule.  If a meal is missed or significantly delayed, your blood sugar could go low.  check your blood sugar twice a day.  vary the time of day when you check, between before the 3 meals, and at  bedtime.  also check if you have symptoms of your blood sugar being too high or too low.  please keep a record of the readings and bring it to  your next appointment here (or you can bring the meter itself).  You can write it on any piece of paper.  please call us sooner if your blood sugar goes below 70, or if you have a lot of readings over 200.  Please see Vaughan Basta, about the "[employer]" system.     Please come back for a follow-up appointment in 2 months.

## 2017-09-23 NOTE — Patient Instructions (Addendum)
I have sent a prescription to your pharmacy, to increase the lantus to 60 units each morning. On this type of insulin schedule, you should eat meals on a regular schedule.  If a meal is missed or significantly delayed, your blood sugar could go low.  check your blood sugar twice a day.  vary the time of day when you check, between before the 3 meals, and at bedtime.  also check if you have symptoms of your blood sugar being too high or too low.  please keep a record of the readings and bring it to your next appointment here (or you can bring the meter itself).  You can write it on any piece of paper.  please call us sooner if your blood sugar goes below 70, or if you have a lot of readings over 200.  Please see Vaughan Basta, about the "[employer]" system.     Please come back for a follow-up appointment in 2 months.

## 2017-09-24 ENCOUNTER — Encounter: Payer: Self-pay | Admitting: Internal Medicine

## 2017-09-24 ENCOUNTER — Ambulatory Visit (INDEPENDENT_AMBULATORY_CARE_PROVIDER_SITE_OTHER): Payer: Managed Care, Other (non HMO) | Admitting: Internal Medicine

## 2017-09-24 ENCOUNTER — Other Ambulatory Visit (INDEPENDENT_AMBULATORY_CARE_PROVIDER_SITE_OTHER): Payer: Managed Care, Other (non HMO)

## 2017-09-24 DIAGNOSIS — E785 Hyperlipidemia, unspecified: Secondary | ICD-10-CM

## 2017-09-24 DIAGNOSIS — I10 Essential (primary) hypertension: Secondary | ICD-10-CM | POA: Diagnosis not present

## 2017-09-24 DIAGNOSIS — E1169 Type 2 diabetes mellitus with other specified complication: Secondary | ICD-10-CM

## 2017-09-24 DIAGNOSIS — N184 Chronic kidney disease, stage 4 (severe): Secondary | ICD-10-CM | POA: Diagnosis not present

## 2017-09-24 LAB — CBC
HCT: 33.8 % — ABNORMAL LOW (ref 39.0–52.0)
HEMOGLOBIN: 11.2 g/dL — AB (ref 13.0–17.0)
MCHC: 33.3 g/dL (ref 30.0–36.0)
MCV: 77.8 fl — AB (ref 78.0–100.0)
PLATELETS: 259 10*3/uL (ref 150.0–400.0)
RBC: 4.34 Mil/uL (ref 4.22–5.81)
RDW: 13.7 % (ref 11.5–15.5)
WBC: 9 10*3/uL (ref 4.0–10.5)

## 2017-09-24 LAB — LDL CHOLESTEROL, DIRECT: Direct LDL: 106 mg/dL

## 2017-09-24 LAB — LIPID PANEL
CHOLESTEROL: 197 mg/dL (ref 0–200)
HDL: 45.6 mg/dL (ref >39.00)
NonHDL: 151.69
TRIGLYCERIDES: 245 mg/dL — AB (ref 0.0–149.0)
Total CHOL/HDL Ratio: 4
VLDL: 49 mg/dL — ABNORMAL HIGH (ref 0.0–40.0)

## 2017-09-24 LAB — RENAL FUNCTION PANEL
Albumin: 3.2 g/dL — ABNORMAL LOW (ref 3.5–5.2)
BUN: 42 mg/dL — ABNORMAL HIGH (ref 6–23)
CALCIUM: 8.9 mg/dL (ref 8.4–10.5)
CO2: 23 meq/L (ref 19–32)
Chloride: 106 mEq/L (ref 96–112)
Creatinine, Ser: 3.3 mg/dL — ABNORMAL HIGH (ref 0.40–1.50)
GFR: 25.21 mL/min — AB (ref >60.00)
GLUCOSE: 172 mg/dL — AB (ref 70–99)
POTASSIUM: 4 meq/L (ref 3.5–5.1)
Phosphorus: 4.3 mg/dL (ref 2.3–4.6)
Sodium: 137 mEq/L (ref 135–145)

## 2017-09-24 MED ORDER — LOSARTAN POTASSIUM 100 MG PO TABS: 100.0000 mg | ORAL_TABLET | Freq: Every day | ORAL | 3 refills | Status: DC

## 2017-09-24 NOTE — Patient Instructions (Signed)
We are checking the labs today. Come back in about 3-4 months to check in on the blood pressure.

## 2017-09-24 NOTE — Assessment & Plan Note (Signed)
BP above goal today although was close to goal yesterday. He reports that this is a more typical reading in the 130s/80s. His goal is <130/80 with his other co-morbidities but he wishes to work on lifestyle changes to help instead of more medications today. He will monitor at home and call back for elevated readings.

## 2017-09-24 NOTE — Assessment & Plan Note (Signed)
Checking renal function panel today for stability. BP is above goal today but was close to goal yesterday so he wants to continue working on weight loss. Diabetes is not under ideal control although he is working on this as well. Adjust as needed.

## 2017-09-24 NOTE — Assessment & Plan Note (Signed)
Checking lipid panel today and adjust crestor 20 mg daily as needed.

## 2017-09-24 NOTE — Progress Notes (Signed)
   Subjective:    Patient ID: Brandon Carroll, male    DOB: 1960/08/05, 57 y.o.   MRN: [id number]  HPI The patient is a 57 YO man coming in for follow up of cholesterol (needs labs, started taking crestor, no side effects, denies muscle aches or chest pains or stroke symptoms), and his blood pressure (taking amlodipine 10 mg, lasix 40 mg, losartan 100 mg, coreg 25 mg BID, denies headaches or chest pains, does sometimes get headaches related to high blood pressure, CKD stage 4 related to hypertension uncontrolled for years, has not taken meds this morning), and his CKD stage 4 (needs labs for stability, some changes to BP regimen, denies having symptoms of high blood pressure, also has diabetes which is not ideally controlled, last HgA1c down but still not at goal).   Review of Systems  Constitutional: Negative.   HENT: Negative.   Eyes: Negative.   Respiratory: Negative for cough, chest tightness and shortness of breath.   Cardiovascular: Negative for chest pain, palpitations and leg swelling.  Gastrointestinal: Negative for abdominal distention, abdominal pain, constipation, diarrhea, nausea and vomiting.  Musculoskeletal: Negative.   Skin: Negative.   Neurological: Negative.   Psychiatric/Behavioral: Negative.       Objective:   Physical Exam  Constitutional: He is oriented to person, place, and time. He appears well-developed and well-nourished.  HENT:  Head: Normocephalic and atraumatic.  Eyes: EOM are normal.  Neck: Normal range of motion.  Cardiovascular: Normal rate and regular rhythm.  Pulmonary/Chest: Effort normal and breath sounds normal. No respiratory distress. He has no wheezes. He has no rales.  Abdominal: Soft. Bowel sounds are normal. He exhibits no distension. There is no tenderness. There is no rebound.  Musculoskeletal: He exhibits no edema.  Neurological: He is alert and oriented to person, place, and time. Coordination normal.  Skin: Skin is warm and dry.    Psychiatric: He has a normal mood and affect.   Vitals:   09/24/17 0802 09/24/17 0819  BP: (!) 160/90 (!) 160/90  Pulse: 75   Temp: 98.8 F (37.1 C)   TempSrc: Oral   SpO2: 97%   Weight: 198 lb (89.8 kg)   Height: 5\' 10"  (1.778 m)       Assessment & Plan:

## 2017-09-30 ENCOUNTER — Other Ambulatory Visit: Payer: Self-pay | Admitting: Internal Medicine

## 2017-09-30 DIAGNOSIS — N184 Chronic kidney disease, stage 4 (severe): Secondary | ICD-10-CM

## 2017-10-11 ENCOUNTER — Encounter: Payer: Self-pay | Admitting: Pulmonary Disease

## 2017-10-28 ENCOUNTER — Other Ambulatory Visit (INDEPENDENT_AMBULATORY_CARE_PROVIDER_SITE_OTHER): Payer: Managed Care, Other (non HMO)

## 2017-10-28 DIAGNOSIS — N184 Chronic kidney disease, stage 4 (severe): Secondary | ICD-10-CM | POA: Diagnosis not present

## 2017-10-28 LAB — RENAL FUNCTION PANEL
Albumin: 3 g/dL — ABNORMAL LOW (ref 3.5–5.2)
BUN: 25 mg/dL — ABNORMAL HIGH (ref 6–23)
CHLORIDE: 106 meq/L (ref 96–112)
CO2: 22 mEq/L (ref 19–32)
Calcium: 9 mg/dL (ref 8.4–10.5)
Creatinine, Ser: 3.06 mg/dL — ABNORMAL HIGH (ref 0.40–1.50)
GFR: 27.5 mL/min — AB (ref >60.00)
Glucose, Bld: 147 mg/dL — ABNORMAL HIGH (ref 70–99)
PHOSPHORUS: 3.6 mg/dL (ref 2.3–4.6)
Potassium: 4.2 mEq/L (ref 3.5–5.1)
SODIUM: 136 meq/L (ref 135–145)

## 2017-10-29 ENCOUNTER — Other Ambulatory Visit: Payer: Self-pay | Admitting: Internal Medicine

## 2017-10-29 DIAGNOSIS — N184 Chronic kidney disease, stage 4 (severe): Secondary | ICD-10-CM

## 2017-11-19 ENCOUNTER — Other Ambulatory Visit: Payer: Self-pay | Admitting: Internal Medicine

## 2017-11-19 DIAGNOSIS — I10 Essential (primary) hypertension: Secondary | ICD-10-CM

## 2017-11-24 ENCOUNTER — Other Ambulatory Visit: Payer: Self-pay | Admitting: Endocrinology

## 2017-11-28 ENCOUNTER — Ambulatory Visit (INDEPENDENT_AMBULATORY_CARE_PROVIDER_SITE_OTHER): Payer: Managed Care, Other (non HMO) | Admitting: Endocrinology

## 2017-11-28 ENCOUNTER — Encounter: Payer: Self-pay | Admitting: Endocrinology

## 2017-11-28 VITALS — BP 198/108 | HR 85 | Wt 196.2 lb

## 2017-11-28 DIAGNOSIS — E1129 Type 2 diabetes mellitus with other diabetic kidney complication: Secondary | ICD-10-CM

## 2017-11-28 DIAGNOSIS — E0821 Diabetes mellitus due to underlying condition with diabetic nephropathy: Secondary | ICD-10-CM

## 2017-11-28 DIAGNOSIS — Z794 Long term (current) use of insulin: Secondary | ICD-10-CM | POA: Diagnosis not present

## 2017-11-28 DIAGNOSIS — I10 Essential (primary) hypertension: Secondary | ICD-10-CM | POA: Diagnosis not present

## 2017-11-28 LAB — POCT GLYCOSYLATED HEMOGLOBIN (HGB A1C): Hemoglobin A1C: 7.9 % — AB (ref 4.0–5.6)

## 2017-11-28 MED ORDER — FREESTYLE LIBRE 14 DAY SENSOR MISC: 1.0000 | 3 refills | Status: DC

## 2017-11-28 NOTE — Progress Notes (Signed)
Subjective:    Patient ID: Brandon Carroll, male    DOB: [birth year], 57 y.o.   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   Dx'ed: 0947 Complications: renal failure. Therapy: insulin since 2014.   DKA: never.  Severe hypoglycemia: never.  Pancreatitis: never.  Other: He works 1st shift, as a Forensic scientist; he is on QD insulin, due to h/o noncompliance.   Interval history: pt says he never misses the insulin.  no cbg record, but states cbg's are in the high-100's.  pt states he feels well in general.  He takes 60 units qd.   Past Medical History:  Diagnosis Date  . Acute systolic CHF (congestive heart failure), NYHA class 2 ([place]) 05/04/2015  . Acute-on-chronic kidney injury ([facility]) 05/04/2015  . Diabetes mellitus   . Erectile dysfunction   . Hyperlipidemia   . Hypertension     Past Surgical History:  Procedure Laterality Date  . CARDIAC CATHETERIZATION N/A 06/15/2015   Procedure: Left Heart Cath and Coronary Angiography;  Surgeon: Peter M Martinique, MD;  Location: [facility];  Service: Cardiovascular;  Laterality: N/A;  . CHOLECYSTECTOMY N/A 06/28/2017   Procedure: LAPAROSCOPIC CHOLECYSTECTOMY;  Surgeon: Ralene Ok, MD;  Location: [facility];  Service: General;  Laterality: N/A;  . HERNIA REPAIR  7th grade   umbilical    Social History   Socioeconomic History  . Marital status: Divorced    Spouse name: n/a  . Number of children: 1  . Years of education: 12+  . Highest education level: Not on file  Occupational History  . Occupation: Building services engineer: [employer]  Social Needs  . Financial resource strain: Not on file  . Food insecurity:    Worry: Not on file    Inability: Not on file  . Transportation needs:    Medical: Not on file    Non-medical: Not on file  Tobacco Use  . Smoking status: Never Smoker  . Smokeless tobacco: Never Used  Substance and Sexual  Activity  . Alcohol use: No    Alcohol/week: 0.0 oz  . Drug use: No  . Sexual activity: Not Currently    Comment: Erectile dysfunciotn  Lifestyle  . Physical activity:    Days per week: Not on file    Minutes per session: Not on file  . Stress: Not on file  Relationships  . Social connections:    Talks on phone: Not on file    Gets together: Not on file    Attends religious service: Not on file    Active member of club or organization: Not on file    Attends meetings of clubs or organizations: Not on file    Relationship status: Not on file  . Intimate partner violence:    Fear of current or ex partner: Not on file    Emotionally abused: Not on file    Physically abused: Not on file    Forced sexual activity: Not on file  Other Topics Concern  . Not on file  Social History Narrative   Divorced. Education: [employer].    Unable to exercise due to significant SOB and DOE.    Lives alone.   Son lives in [place].    Current Outpatient Medications on File Prior to Visit  Medication Sig Dispense Refill  . Albuterol Sulfate (PROAIR RESPICLICK) 096 (90 BASE) MCG/ACT AEPB Inhale 2 puffs into  the lungs every 6 (six) hours as needed. 1 each 3  . amLODipine (NORVASC) 10 MG tablet Take 1 tablet (10 mg total) by mouth daily. 90 tablet 3  . aspirin EC 81 MG tablet Take 81 mg by mouth daily.    . BD PEN NEEDLE NANO U/F 32G X 4 MM MISC use once daily as directed 100 each PRN  . carvedilol (COREG) 25 MG tablet Take 1 tablet (25 mg total) 2 (two) times daily by mouth. 60 tablet 9  . Continuous Blood Gluc Receiver (FREESTYLE LIBRE 14 DAY READER) DEVI USE AS DIRECTED 6 Device 3  . fluticasone furoate-vilanterol (BREO ELLIPTA) 100-25 MCG/INH AEPB Inhale 1 puff into the lungs daily. 28 each 11  . furosemide (LASIX) 40 MG tablet Take 1 tablet (40 mg total) by mouth daily. 90 tablet 3  . glucose blood (ONETOUCH VERIO) test strip 1 each by Other route 2 (two) times daily as needed for other. And  lancets 2/day 100 each 12  . Insulin Glargine (LANTUS SOLOSTAR) 100 UNIT/ML Solostar Pen Inject 60 Units into the skin every morning. And pen needles 1/day 10 pen PRN  . losartan (COZAAR) 100 MG tablet Take 1 tablet (100 mg total) by mouth daily. 90 tablet 3  . rosuvastatin (CRESTOR) 20 MG tablet Take 1 tablet (20 mg total) daily by mouth. 90 tablet 3  . Spacer/Aero-Holding Chambers (AEROCHAMBER MV) inhaler Use as instructed 1 each 0  . traMADol (ULTRAM) 50 MG tablet Take 1 tablet (50 mg total) by mouth every 6 (six) hours as needed. 20 tablet 0   No current facility-administered medications on file prior to visit.     Allergies  Allergen Reactions  . Lipitor [Atorvastatin] Hives and Itching    Family History  Problem Relation Age of Onset  . Arthritis Mother   . COPD Mother   . Diabetes Mother        was thin, took insulin  . Hypertension Mother   . Hypertension Father   . Arthritis Sister        rheumatoid  . COPD Sister   . Diabetes Brother   . Colon cancer Neg Hx     BP (!) 198/108 (BP Location: Left Arm, Patient Position: Sitting, Cuff Size: Normal)   Pulse 85   Wt 196 lb 3.2 oz (89 kg)   SpO2 96%   BMI 28.15 kg/m    Review of Systems He denies hypoglycemia.      Objective:   Physical Exam VITAL SIGNS:  See vs page GENERAL: no distress Pulses: dorsalis pedis intact bilat.   MSK: no deformity of the feet CV: no leg edema Skin:  no ulcer on the feet, but the skin is dry.  normal color and temp on the feet. Neuro: sensation is intact to touch on the feet Ext: There is bilateral onychomycosis of the toenails.    Lab Results  Component Value Date   HGBA1C 7.9 (A) 11/28/2017   Lab Results  Component Value Date   CREATININE 3.06 (H) 10/28/2017   BUN 25 (H) 10/28/2017   NA 136 10/28/2017   K 4.2 10/28/2017   CL 106 10/28/2017   CO2 22 10/28/2017       Assessment & Plan:  HTN: is noted today Insulin-requiring type 2 DM.  this is the best control this  pt should aim for, given this regimen, which does match insulin to his changing needs throughout the day Renal failure: fructosamine my be a better indicator of  glycemic control.  Patient Instructions  Your blood pressure is high today.  Please have it rechecked soon.  A different type of diabetes blood test is requested for you today.  We'll let you know about the results.  If it is high, we'll slightly increase the insulin On this type of insulin schedule, you should eat meals on a regular schedule.  If a meal is missed or significantly delayed, your blood sugar could go low.  check your blood sugar twice a day.  vary the time of day when you check, between before the 3 meals, and at bedtime.  also check if you have symptoms of your blood sugar being too high or too low.  please keep a record of the readings and bring it to your next appointment here (or you can bring the meter itself).  You can write it on any piece of paper.  please call us sooner if your blood sugar goes below 70, or if you have a lot of readings over 200.    Please come back for a follow-up appointment in 3 months.

## 2017-11-28 NOTE — Patient Instructions (Addendum)
Your blood pressure is high today.  Please have it rechecked soon.  A different type of diabetes blood test is requested for you today.  We'll let you know about the results.  If it is high, we'll slightly increase the insulin On this type of insulin schedule, you should eat meals on a regular schedule.  If a meal is missed or significantly delayed, your blood sugar could go low.  check your blood sugar twice a day.  vary the time of day when you check, between before the 3 meals, and at bedtime.  also check if you have symptoms of your blood sugar being too high or too low.  please keep a record of the readings and bring it to your next appointment here (or you can bring the meter itself).  You can write it on any piece of paper.  please call us sooner if your blood sugar goes below 70, or if you have a lot of readings over 200.    Please come back for a follow-up appointment in 3 months.

## 2017-12-02 LAB — FRUCTOSAMINE: Fructosamine: 225 umol/L (ref 190–270)

## 2017-12-26 ENCOUNTER — Encounter: Payer: Self-pay | Admitting: Internal Medicine

## 2017-12-26 ENCOUNTER — Ambulatory Visit (INDEPENDENT_AMBULATORY_CARE_PROVIDER_SITE_OTHER): Payer: Managed Care, Other (non HMO) | Admitting: Internal Medicine

## 2017-12-26 ENCOUNTER — Other Ambulatory Visit (INDEPENDENT_AMBULATORY_CARE_PROVIDER_SITE_OTHER): Payer: Managed Care, Other (non HMO)

## 2017-12-26 DIAGNOSIS — I10 Essential (primary) hypertension: Secondary | ICD-10-CM

## 2017-12-26 DIAGNOSIS — N184 Chronic kidney disease, stage 4 (severe): Secondary | ICD-10-CM

## 2017-12-26 DIAGNOSIS — R7989 Other specified abnormal findings of blood chemistry: Secondary | ICD-10-CM

## 2017-12-26 LAB — RENAL FUNCTION PANEL
ALBUMIN: 3.3 g/dL — AB (ref 3.5–5.2)
BUN: 32 mg/dL — ABNORMAL HIGH (ref 6–23)
CALCIUM: 9.3 mg/dL (ref 8.4–10.5)
CO2: 26 mEq/L (ref 19–32)
Chloride: 105 mEq/L (ref 96–112)
Creatinine, Ser: 3.89 mg/dL — ABNORMAL HIGH (ref 0.40–1.50)
GFR: 20.84 mL/min — ABNORMAL LOW (ref >60.00)
GLUCOSE: 163 mg/dL — AB (ref 70–99)
Phosphorus: 4.2 mg/dL (ref 2.3–4.6)
Potassium: 4.5 mEq/L (ref 3.5–5.1)
Sodium: 137 mEq/L (ref 135–145)

## 2017-12-26 MED ORDER — CLONIDINE 0.3 MG/24HR TD PTWK: 0.3000 mg | MEDICATED_PATCH | TRANSDERMAL | 12 refills | Status: DC

## 2017-12-26 MED ORDER — AMOXICILLIN-POT CLAVULANATE 875-125 MG PO TABS: 1.0000 | ORAL_TABLET | Freq: Two times a day (BID) | ORAL | 0 refills | Status: DC

## 2017-12-26 MED ORDER — LOSARTAN POTASSIUM 100 MG PO TABS: 100.0000 mg | ORAL_TABLET | Freq: Every day | ORAL | 3 refills | Status: DC

## 2017-12-26 NOTE — Assessment & Plan Note (Signed)
Checking testosterone level today as I only see 1 marginal low level in the past which does not qualify for diagnosis of low testosterone.

## 2017-12-26 NOTE — Assessment & Plan Note (Signed)
His nephrologist is concerned about underlying kidney disease and tried to get biopsy (patient did not show) twice. They do not want to pursue this anymore. Checking renal function today. He has been progressive drop of about 10 points in the last year.

## 2017-12-26 NOTE — Patient Instructions (Signed)
We have sent in a clonidine patch for blood pressure. Put the patch on and change it once a week.   Try to limit the sodium in your food to 3500 mg per day.    DASH Eating Plan DASH stands for "Dietary Approaches to Stop Hypertension." The DASH eating plan is a healthy eating plan that has been shown to reduce high blood pressure (hypertension). It may also reduce your risk for type 2 diabetes, heart disease, and stroke. The DASH eating plan may also help with weight loss. What are tips for following this plan? General guidelines  Avoid eating more than 2,300 mg (milligrams) of salt (sodium) a day. If you have hypertension, you may need to reduce your sodium intake to 1,500 mg a day.  Limit alcohol intake to no more than 1 drink a day for nonpregnant women and 2 drinks a day for men. One drink equals 12 oz of beer, 5 oz of wine, or 1 oz of hard liquor.  Work with your health care provider to maintain a healthy body weight or to lose weight. Ask what an ideal weight is for you.  Get at least 30 minutes of exercise that causes your heart to beat faster (aerobic exercise) most days of the week. Activities may include walking, swimming, or biking.  Work with your health care provider or diet and nutrition specialist (dietitian) to adjust your eating plan to your individual calorie needs. Reading food labels  Check food labels for the amount of sodium per serving. Choose foods with less than 5 percent of the Daily Value of sodium. Generally, foods with less than 300 mg of sodium per serving fit into this eating plan.  To find whole grains, look for the word "whole" as the first word in the ingredient list. Shopping  Buy products labeled as "low-sodium" or "no salt added."  Buy fresh foods. Avoid canned foods and premade or frozen meals. Cooking  Avoid adding salt when cooking. Use salt-free seasonings or herbs instead of table salt or sea salt. Check with your health care provider or  pharmacist before using salt substitutes.  Do not fry foods. Cook foods using healthy methods such as baking, boiling, grilling, and broiling instead.  Cook with heart-healthy oils, such as olive, canola, soybean, or sunflower oil. Meal planning   Eat a balanced diet that includes: ? 5 or more servings of fruits and vegetables each day. At each meal, try to fill half of your plate with fruits and vegetables. ? Up to 6-8 servings of whole grains each day. ? Less than 6 oz of lean meat, poultry, or fish each day. A 3-oz serving of meat is about the same size as a deck of cards. One egg equals 1 oz. ? 2 servings of low-fat dairy each day. ? A serving of nuts, seeds, or beans 5 times each week. ? Heart-healthy fats. Healthy fats called Omega-3 fatty acids are found in foods such as flaxseeds and coldwater fish, like sardines, salmon, and mackerel.  Limit how much you eat of the following: ? Canned or prepackaged foods. ? Food that is high in trans fat, such as fried foods. ? Food that is high in saturated fat, such as fatty meat. ? Sweets, desserts, sugary drinks, and other foods with added sugar. ? Full-fat dairy products.  Do not salt foods before eating.  Try to eat at least 2 vegetarian meals each week.  Eat more home-cooked food and less restaurant, buffet, and fast food.  When eating at a restaurant, ask that your food be prepared with less salt or no salt, if possible. What foods are recommended? The items listed may not be a complete list. Talk with your dietitian about what dietary choices are best for you. Grains Whole-grain or whole-wheat bread. Whole-grain or whole-wheat pasta. Brown rice. [place]. Bulgur. Whole-grain and low-sodium cereals. Pita bread. Low-fat, low-sodium crackers. Whole-wheat flour tortillas. Vegetables Fresh or frozen vegetables (raw, steamed, roasted, or grilled). Low-sodium or reduced-sodium tomato and vegetable juice. Low-sodium or  reduced-sodium tomato sauce and tomato paste. Low-sodium or reduced-sodium canned vegetables. Fruits All fresh, dried, or frozen fruit. Canned fruit in natural juice (without added sugar). Meat and other protein foods Skinless chicken or [place]. Ground chicken or [place]. Pork with fat trimmed off. Fish and seafood. Egg whites. Dried beans, peas, or lentils. Unsalted nuts, nut butters, and seeds. Unsalted canned beans. Lean cuts of beef with fat trimmed off. Low-sodium, lean deli meat. Dairy Low-fat (1%) or fat-free (skim) milk. Fat-free, low-fat, or reduced-fat cheeses. Nonfat, low-sodium ricotta or cottage cheese. Low-fat or nonfat yogurt. Low-fat, low-sodium cheese. Fats and oils Soft margarine without trans fats. Vegetable oil. Low-fat, reduced-fat, or light mayonnaise and salad dressings (reduced-sodium). Canola, safflower, olive, soybean, and sunflower oils. Avocado. Seasoning and other foods Herbs. Spices. Seasoning mixes without salt. Unsalted popcorn and pretzels. Fat-free sweets. What foods are not recommended? The items listed may not be a complete list. Talk with your dietitian about what dietary choices are best for you. Grains Baked goods made with fat, such as croissants, muffins, or some breads. Dry pasta or rice meal packs. Vegetables Creamed or fried vegetables. Vegetables in a cheese sauce. Regular canned vegetables (not low-sodium or reduced-sodium). Regular canned tomato sauce and paste (not low-sodium or reduced-sodium). Regular tomato and vegetable juice (not low-sodium or reduced-sodium). Angie Fava. Olives. Fruits Canned fruit in a light or heavy syrup. Fried fruit. Fruit in cream or butter sauce. Meat and other protein foods Fatty cuts of meat. Ribs. Fried meat. Berniece Salines. Sausage. Bologna and other processed lunch meats. Salami. Fatback. Hotdogs. Bratwurst. Salted nuts and seeds. Canned beans with added salt. Canned or smoked fish. Whole eggs or egg yolks. Chicken or [place]  with skin. Dairy Whole or 2% milk, cream, and half-and-half. Whole or full-fat cream cheese. Whole-fat or sweetened yogurt. Full-fat cheese. Nondairy creamers. Whipped toppings. Processed cheese and cheese spreads. Fats and oils Butter. Stick margarine. Lard. Shortening. Ghee. Bacon fat. Tropical oils, such as coconut, palm kernel, or palm oil. Seasoning and other foods Salted popcorn and pretzels. Onion salt, garlic salt, seasoned salt, table salt, and sea salt. Worcestershire sauce. Tartar sauce. Barbecue sauce. Teriyaki sauce. Soy sauce, including reduced-sodium. Steak sauce. Canned and packaged gravies. Fish sauce. Oyster sauce. Cocktail sauce. Horseradish that you find on the shelf. Ketchup. Mustard. Meat flavorings and tenderizers. Bouillon cubes. Hot sauce and Tabasco sauce. Premade or packaged marinades. Premade or packaged taco seasonings. Relishes. Regular salad dressings. Where to find more information:  National Heart, Lung, and [place]: https://wilson-eaton.com/  American Heart Association: www.heart.org Summary  The DASH eating plan is a healthy eating plan that has been shown to reduce high blood pressure (hypertension). It may also reduce your risk for type 2 diabetes, heart disease, and stroke.  With the DASH eating plan, you should limit salt (sodium) intake to 2,300 mg a day. If you have hypertension, you may need to reduce your sodium intake to 1,500 mg a day.  When on the DASH eating plan,  aim to eat more fresh fruits and vegetables, whole grains, lean proteins, low-fat dairy, and heart-healthy fats.  Work with your health care provider or diet and nutrition specialist (dietitian) to adjust your eating plan to your individual calorie needs. This information is not intended to replace advice given to you by your health care provider. Make sure you discuss any questions you have with your health care provider. Document Released: 04/19/2011 Document Revised: 04/23/2016  Document Reviewed: 04/23/2016 Elsevier Interactive Patient Education  [employer].

## 2017-12-26 NOTE — Assessment & Plan Note (Signed)
BP is severely elevated today and needs adjustment of therapy. Adding clonidine 0.3 mg patch weekly as he has high pill burden already. He will continue coreg BID, amlodipine, losartan, lasix. Needs visit in 1 month to assess BP. Checking renal function panel today.

## 2017-12-26 NOTE — Progress Notes (Signed)
   Subjective:    Patient ID: Brandon Carroll, male    DOB: 19-May-1960, 57 y.o.   MRN: [id number]  HPI The patient is a 57 YO man coming in for several concerns including hypertension (BP running high at home lately, denies chest pains, getting some headaches lately, is taking meds all the time but getting congested from coreg and is not thrilled with taking it, taking lasix losartan coreg amlodipine, denies missing doses or being out) and low testosterone (has had checked in the past and went to see men's clinic but they were too expensive, wants to consider some replacement) and ckd stage 4 (last GFR 27, steadily declining in the last several years, has seen nephrology in the past, combo diabetes and hypertension and possible underlying kidney disease).   Review of Systems  Constitutional: Negative.   HENT: Negative.   Eyes: Negative.   Respiratory: Positive for cough. Negative for chest tightness and shortness of breath.   Cardiovascular: Negative for chest pain, palpitations and leg swelling.  Gastrointestinal: Negative for abdominal distention, abdominal pain, constipation, diarrhea, nausea and vomiting.  Musculoskeletal: Negative.   Skin: Negative.   Neurological: Positive for headaches.  Psychiatric/Behavioral: Negative.       Objective:   Physical Exam  Constitutional: He is oriented to person, place, and time. He appears well-developed and well-nourished.  HENT:  Head: Normocephalic and atraumatic.  Eyes: EOM are normal.  Neck: Normal range of motion.  Cardiovascular: Normal rate and regular rhythm.  Pulmonary/Chest: Effort normal and breath sounds normal. No respiratory distress. He has no wheezes. He has no rales.  Abdominal: Soft. Bowel sounds are normal. He exhibits no distension. There is no tenderness. There is no rebound.  Musculoskeletal: He exhibits no edema.  Neurological: He is alert and oriented to person, place, and time. Coordination normal.  Skin: Skin is  warm and dry.   Vitals:   12/26/17 0759  BP: (!) 180/110  Pulse: 76  Temp: 98.7 F (37.1 C)  TempSrc: Oral  SpO2: 97%  Weight: 194 lb (88 kg)  Height: 5\' 10"  (1.778 m)      Assessment & Plan:

## 2017-12-29 LAB — TESTOSTERONE, FREE, TOTAL, SHBG
SEX HORMONE BINDING: 54.7 nmol/L (ref 19.3–76.4)
TESTOSTERONE FREE: 7.9 pg/mL (ref 7.2–24.0)
Testosterone: 464 ng/dL (ref 264–916)

## 2017-12-30 NOTE — Progress Notes (Signed)
Subjective:     Patient ID: Brandon Carroll, male   DOB: 1961-03-18, 57 y.o.   MRN: [id number]  HPI ~  June 08, 2015:  Initial pulmonary consult by SN>        68 y/o man referred by Harrison Mons PA, [facility] for a pulmonary evaluation to r/o Sarcoidosis;  He has a history of a cardiomyopathy, chronic systolic CHF w/ SN=05-39%, HBP, DM, HL, etc;  He was [facility] 12/19 - 05/05/15 by [facility] after presenting w/ dyspnea, PND & 2 pillow orthopnea;  CXR showed bilat effusions, some incr markings and prob LLL atx;  2DEcho showed a cardiomyopathy w/ EF=40-45%;  Labs showed elev BNP=971, mild renal insuffic w/ Cr=1.4-1.6, mild anemia w/ Hg=11-12 and small cells w/ MCV=76;  Pt was diuresed w/ Lasix & lost 7# of fluid w/ improvement in his breathing (also given empiric Levaquin rx) and he was disch for outpt follow up... CT Chest performed 05/04/15 showed scattered bilat pulm nodules (largest=63m in RUL) and mild adenopathy (R paratrach/ subcarinal/ aortopulm window/ & ?bilat hilar regions), norm heart size, coronary atherosclerosis noted, bilat effusions and basilar atx...        For his part the patient states that his SOB has completely resolved (w/ treatment of his CHF);  He notes min cough, small amt of clear sput, no hemoptysis, denies f/c/s, no CP;  He notes some DOE but able to perform his duties at [employer] & down stairs now...   Smoking Hx>  He is a never smoker...  Pulmonary Hx>  He has no prev hx of lung disease- no hx asthma, bronchitis, pneumonia, Tb or exposure.  Medical Hx>  As below-- HBP, CHF/cardiomyopathy (never seen by Cards), DM, HL, RI, anemia...  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  Current Meds>  AlbutHFA prn, Mucinex, Coreg, Calan, Lisinopril, Lasix, Mevacor, Glucotrol EXAM shows Afeb, VSS, O2sat=98% on RA;  Heent- neg, mallampati2;  Chest- clear w/o w/r/r;  Heart- RR  w/o m/r/g;  Abd- soft, nontender, neg;  Ext- neg w/o c/c/e;  Neuro- intact w/o focal deficits...  CXR 05/02/15 showed bilat effusions, some incr markings and prob LLL atx  EKG 05/02/15 showed NSR, rate73/min, +STTWA- consider inferolat ischemia...  2DEcho 05/03/15 showed mild LVH, mod reduced LVF w/ EF=40-45% & diffuse HK; MV & AoV are wnl, LA is mod dilated, RA is mildly dilated  Labs in Epic from 04/2015 showed elev BNP=971, mild renal insuffic w/ Cr=1.4-1.6, mild anemia w/ Hg=11-12 and small cells w/ MCV=76   CT Chest performed 05/04/15 showed scattered bilat pulm nodules (largest=6563min RUL) and mild adenopathy (R paratrach/ subcarinal/ aortopulm window/ & ?bilat hilar regions), norm heart size, coronary atherosclerosis noted, bilat effusions and basilar atx...    Spirometry 06/08/15 showed FVC=3.36 (81%), FEV1=2.62 (79%), %1sec=78, mid-flows are sl reduced at 61% predicted; This is c/w very mild airflow obstruction esp in small airways, can't r/o superimposed restriction w/o LV measurement.   Ambulatory Oximetry 06/08/15>  O2sat=100% on RA at rest w/ pulse=66/min; He ambulated 3 laps w/o difficulty, lowest O2sat=99% w/ pulse=74/min...  IMP >>    1) Abn CXR & CTChest w/ scattered bilat pulm nodules (largest=63m59mn RUL) and mild adenopathy seen (R paratrach/ subcarinal/ aortopulm window/ & ?bilat hilar regions)> the Adm CXR 04/2015 appeared to me to be c/w CHF w/ bilat effusions, interstitial edema, and some LLL atx (clinical picture  was not that of pneumonia w/o fever/ purulent sput, normal WBC ct, neg pneumococcal & legionella Ag, etc); radiology wondered about sarcoidosis, pt was on Lisinopril at time of Adm & therefore no ACE level checked => we switched Lisinopril to LOSARTAN100 today...     2) HBP> controlled on Coreg 12.5- 1/2Bid, Calan120Bid, Lisinopril40, Lasix20-2Bid => we switched the ACE to ARB as above...    3) Cardiomyopathy w/ chronic systolic CHF and CH=88-50% by 2DEcho> BNP in  hosp was 971 & he diuresed 7+lbs of fluid w/ improvement in his breathing; he has not seen Cards & they didn't call consult in [place]; CTChest alluded to calcif in coronaries and he needs Cards consult for Cath and on-going treatment for his CHF.Marland KitchenMarland Kitchen     4) Hyperlipidemia> on Mevacor20 per PCP.Marland KitchenMarland Kitchen    5) Diabetes Mellitus> he has seen DrEllison in the past, A1c=7.0 04/2015 Hosp, urinalysis was pos for proteinuria; Prev on LANTUS & Metformin, now just on Glucotrol?    6) Renal insuffic> baseline Cr appears to be ~1.2-1.3, Cr in [place] was 1.4-1.6 w/ diuresis, then back to 1.3    7) Elev Alk Phos> this fractionated to liver but GOT/GPT are wnl    8) Anemia w/ small cells> Hg in Hosp was 11.9 w/ MCV=76; baseline in Epic ~12-13 w/o Iron studies or anemia eval previously done...  PLAN >>     Brandon Carroll appears to have a major heart problem and needs to see Cardiology 1st- we will refer ASAP so they can manage his cardiomyopathy & eval for poss underlying CAD;  From the pulmonary perspective he is a never smoker & has no prior hx of any lung problem;  He has an abn CXR & CT Chest as noted, only mild PFT abnormality & I am not yet ready to commit him to a lung Bx or mediastinoscopy & bx to cement the dx of sarcoidosis;  For now we will switch his Lisinopril to Losartan (so we can check ACE level later) and we will plan ROV in 2-78mow/ CXR at that time (he will need f/u CT at a later date).  ~  August 11, 2015:  234moOV & Brandon Carroll saw CARDS, DrRandolph & her notes are reviewed> Hypertensive heart dis, chr sys & diastolic CHF, mult coronary risk factors;  They adjusted his BP meds & did a heart CATH 06/15/15 showing trivial CAD w/ 10% LAD & RCA, and elev LV filling pressures;  DOE w/ min exertion persisted and his Lasix was adjusted w/ improvement;  Off the ACE inhibitor his ACE level was checked and ret at 41 (8-52) confirming outr opinion that he does not have active sarcoidosis;  He was placed on Breo daily & Albut HFA for  prn use- ?any benefit... We reviewed the following medical problems during today's office visit >>     Abn CXR & CTChest w/ scattered bilat pulm nodules (largest=64m24mn RUL) and mild adenopathy seen (R paratrach/ subcarinal/ aortopulm window/ & ?bilat hilar regions)> the Adm CXR appeared to me to be c/w CHF w/ bilat effusions, interstitial edema, and some LLL atx (clinical picture was not that of pneumonia w/o fever/ purulent sput, normal WBC ct, neg pneumococcal & legionella Ag, etc); radiology wondered about sarcoidosis, pt was on Lisinopril at time of Adm & therefore no ACE level checked => we switched Lisinopril to LOSARTAN100 & checked ACE level 07/05/15=> 41 (norm8-52) confirming our clinical impression of no active sarcoid... He wqas placed on BREO & ALbutHFA rescue  inhaler in the interim as a trial- no real change noted (Spirometry 05/2015 was OK x mild small airways dis).    HBP> better controlled on Coreg 25Bid, Amlod5, Losar100, Lasix40/d;  BP= 146/94 and he says breathing is improved, feeling better, no CP/ palpit/ edema...    Cardiomyopathy w/ chronic systolic CHF and UL=84-53% by 2DEcho> BNP in hosp was 971 & he diuresed 7+lbs of fluid w/ improvement in his breathing; CTChest alluded to calcif in coronaries and he was seen by Ivar Drape- referred for CATH 06/10/15 showing triv CAD (10% LAD & RCA) and elev LV filling pressures c/w a non-ischemic cardiomyopathy;  He is improved w/ better BP control & CHF rx per Cards...    Hyperlipidemia> prev on Mevacor20 w/ FLP 07/26/15 showing TChol 254, TG 109, HDL 74, LDL 158; DrRandolph changed Mevacor20 to [place]...    Diabetes Mellitus> he has seen DrEllison in the past, A1c=7.0 04/2015 Hosp, urinalysis was pos for proteinuria; Prev on LANTUS & Metformin, now just on Glucotrol10Bid, BS=150-160 w/ A1c=7.4 (06/21/15)    Renal insuffic> baseline Cr appears to be ~1.2-1.3, Cr in [place] was 1.4-1.6 w/ diuresis ^ improvement in CHF...    Elev Alk Phos> this  fractionated to liver but GOT/GPT are wnl; PCP did Abd Sonar 07/08/15 showing 3.6 cm GB polyp, no stones, ducts ok & liver ok...    Anemia w/ small cells> Hg in Hosp was 11.9 w/ MCV=76; baseline in Epic ~12-13; repeat labs here 08/11/15=> Hb=13.1, MCV=77, Fe=89 (30%sat), Ferritin=60, B12=489, Folate=5.0 & rec OTC MVI + Folic Acid...    Rash>  He saw [facility] who felt rash was cutaneous fungal infection, not sarcoid, KOH was pos & rash resolved w/ Fluconazole & topical cream... EXAM shows Afeb, VSS (BP=146/94), O2sat=99% on RA;  Heent- neg, mallampati2;  Chest- clear w/o w/r/r;  Heart- RR w/o m/r/g;  Abd- soft, nontender, neg;  Ext- neg w/o c/c/e;  Neuro- intact w/o focal deficits...  LABS 08/11/15>  Chems- BS=318, Cr=1.62;  Hb=13.1, MCV=77, Fe=89 (30%sat), Ferritin=60, B12=489, Folate=5.0 & rec OTC MVI + Folic Acid... IMP/PLAN >>  His breathing is much improved on Lasix diuresis & adjustment in BP/ CHF meds- doubt much benefit from [facility];  F/u labs today show BS=318 on his Glucotrol alone- needs f/u DrEllison for Endocrine;  Anemia labs show Hg back to norm at 13 but cells still small & HAVE ALWAYS BEEN SMALL suggesting prob thallassemia minor, but Folic level sl low so we will rec MVI + OTC Folate... Plan is to see him back in June- time for CXR & f/u CT Chest to check the nodules seen 04/2015...   ~  December 29, 2015:  16moROV & JSeeleyis doing reasonably well from the pulm standpoint- notes sl dry cough, OTC Delsym helps, no SOB/ CP/ palpit/ sput/ hemoptysis/ f/c/s etc;  He is exercising at a gym on the treadmill he says;; Meds costs are a real issue for him- he ran out of Breo & couldn't afford refill;  [facility] >300 w/ A1c=11.3 & they added FWilder Glade(which he could not afford to refill)... We reviewed the following medical problems during today's office visit >>     Abn CXR & CTChest, Elev ACE level=> c/w SARCOIDOSIS>   ~  04/2015 Hosp>  CTChest  04/2015 showed scattered bilat pulm nodules (largest=630min RUL) and mild adenopathy (R paratrach/ subcarinal/ aortopulm window/ & ?bilat hilar regions); CXR was c/w  CHF w/ bilat effusions, interstitial edema, and some LLL atx (clinical picture was not that of pneumonia w/o fever/ purulent sput, normal WBC ct, neg pneumococcal & legionella Ag, etc); radiology wondered about sarcoidosis, pt was on Lisinopril at that time & therefore no ACE level checked =>  ~  06/08/15 consult>  we switched Lisinopril to LOSARTAN100 & checked ACE level 07/05/15=> 41 (norm8-52) suggesting no active sarcoid...  ~  07/2015>  He was placed on BREO & ALbutHFA rescue inhaler in the interim as a trial- no real change noted & too $$ (Spirometry 05/2015 was OK x mild small airways dis)...  ~  12/29/15>  Due for f/u CT Chest (+bilat patchy GGO & nodularity w/ adenopathy c/w sarcoid), and ACE level (elev >100 at this time)=> all c/w active sarcoid at this time, consider options for treatment w/ or w/o lung bx...    HBP> controlled on Coreg 25Bid, Amlod5Bid, Losartan100, Lasix40/d;  BP= 136/82 and he says breathing is at baseline, feeling well- no CP/ palpit/ edema...    Nonischemic Cardiomyopathy w/ chronic systolic CHF and WN=02-72% by 2DEcho> BNP in hosp 04/2015 was 971 & he diuresed 7+lbs of fluid w/ improvement in his breathing; CTChest alluded to calcif in coronaries and he was seen by Ivar Drape 05/2015- CATH showing triv CAD (10% LAD & RCA) and elev LV filling pressures c/w a non-ischemic cardiomyopathy;  He is improved w/ better BP control & CHF rx per Cards...    Hyperlipidemia> prev on Mevacor20 w/ FLP 07/26/15 showing TChol 254, TG 109, HDL 74, LDL 158; DrRandolph changed Mevacor20 to [place] & now on Crestor20...    Diabetes Mellitus> seen DrEllison 11/2014 & placed on Lantus/ Humalog; pt couldn't afford & never followed up, 04/2015 Hosp- A1c=7.0 & disch on Glucotrol 10Bid w/ BS=150-160 w/ A1c=7.4 (06/21/15); but since then BS>300 &  A1c>11.0 and he desperately needs Endocrine management of DM w/ need for Prednisone Rx of his Sarcoid!    Renal insuffic> baseline Cr appears to be ~1.2-1.3, Cr in [place] was 1.4-1.6 w/ diuresis & improvement in CHF; most recent Cr= 1.6-1.8 range...    Elev Alk Phos> this fractionated to liver but GOT/GPT are wnl; PCP did Abd Sonar 07/08/15 showing 3.6 cm GB polyp, no stones, ducts ok & liver ok...    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    Rash>  He saw [facility] who felt rash was cutaneous fungal infection, not sarcoid, KOH was pos & rash resolved w/ Fluconazole & topical cream... EXAM shows Afeb, VSS, O2sat=97% on RA;  Heent- neg, mallampati2;  Chest- clear w/o w/r/r;  Heart- RR w/o m/r/g;  Abd- soft, nontender, neg;  Ext- neg w/o c/c/e;  Neuro- intact w/o focal deficits...  CXR 12/29/15>  Norm heart size & vascularity, clear lungs, no effusions, sl fullness in hilum, NAD...  LABS 12/29/15>  Chems- BS=348, A1c=11.0, BUN=27. Cr=1.78;  Sed=63;  ACE>100...  CT Chest done 01/03/16>  Norm heart size, Ao & coronary atherosclerosis, norm caliber PAs, mildly enlarged mediastinal & hilar nodes, scat bilat mid & upper lobe nodularity w/ dominant 10m RUL nodule unchanged, mild patchy GGO in bilat upper lobes w/ dominant LUL 1.2cm nodule which is new => all felt to be c/w sarcoid. IMP/PLAN>>  Brandon Carroll has a pattern of pulm abnormalities that are concordant w/ the diagnosis of active  sarcoidosis at this time; we do not have a biopsy for confirmation but he would like to avoid lung bx if poss & I indicated that I would be willing to treat him for this condition & follow his response very closely so long as he was also under the care of a diabetic specialist at the same time;  His DM is poorly controlled & he will need active  management of this condition w/ meds he can afford to fill (ie- prob NPH & Reg inulin or mixed 70/30 or similar) while we place him on Pred & taper it as rapidly as possible... I would like him to see the diabetologist before we consider starting the Pred rx...   ~  July 02, 2016:  51moROV & JAyyanhas established w/ DrEllison for DM management, unfortunately his control remains poor- a combination of his severe disease and poor compliance, now w/ slowly progressive renal insuffic & needs to establish w/ Nephrology; fortunately his presumed sarcoid is stable w/o signs of progressive pulmonary or reticuloendothelial disease...  ~  Since he was here last he has seen DrEllison twice- 1st was 02/08/16 & 2nd 06/18/16> on LANTUS 10=> incr to 25u recently w/ LABS showing A1c=9.6..Marland Kitchen  ~  His PCP is listed as [facility] [place]- PA-[place] but he hasn't been seen in some time (last 5/17)...    HBP> controlled on Coreg 25Bid, Amlod5Bid, Losartan100, Lasix40/d;  BP= 136/82 and he says breathing is at baseline, feeling well- no CP/ palpit/ edema...    Nonischemic Cardiomyopathy w/ chronic systolic CHF and EGO=11-57%by 2DEcho> BNP in hosp 04/2015 was 971 & he diuresed 7+lbs of fluid w/ improvement in his breathing; CTChest alluded to calcif in coronaries and he was seen by DIvar Drape1/2017- CATH showing triv CAD (10% LAD & RCA) and elev LV filling pressures c/w a non-ischemic cardiomyopathy;  He is improved w/ better BP control & CHF rx per Cards...    Hyperlipidemia> prev on Mevacor20 w/ FLP 07/26/15 showing TChol 254, TG 109, HDL 74, LDL 158; DrRandolph changed Mevacor20 to [place]& now on Crestor20...    Diabetes Mellitus> seen DrEllison 11/2014 & placed on Lantus/ Humalog; pt couldn't afford & never followed up, 04/2015 Hosp- A1c=7.0 & disch on Glucotrol 10Bid w/ BS=150-160 w/ A1c=7.4 (06/21/15); but since then BS>300 & A1c>11.0 and he desperately needs Endocrine management of DM w/ need for Prednisone Rx of his  Sarcoid!    Renal insuffic> baseline Cr appears to be ~1.2-1.3, [place] 1[place]1.6 w/ diuresis & improvement in CHF; most recent Cr= 1.6-1.8 range...    Elev Alk Phos> this fractionated to liver but GOT/GPT are wnl; PCP did Abd Sonar 07/08/15 showing 3.6 cm GB polyp, no stones, ducts ok & liver ok...    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    Rash>  He saw [facility] who felt rash was cutaneous fungal infection, not sarcoid, KOH was pos & rash resolved w/ Fluconazole & topical cream...    Poor compliance w/ medications> I called [facility] [employer] on [address] (he gets is ral meds here & no Breo since 8/17, last filled Coreg&Losar 11/17 (1836mo Lasix 11/17 (83m46moAmlod1/18 (870mo66moe gets his insulin at [facility] [employer] Lantus 10u/d-870mo 48moly 02/13/16, then new rx for 25u/d filled 06/23/16 (36mo [id number])...Marland KitchenMarland Kitchen  EXAM shows Afeb, VSS w/ BP=150/90 despite poor med compliance, O2sat=100% on RA;  Heent- neg, mallampati2;  Chest- clear w/o w/r/r;  Heart- RR w/o m/r/g;  Abd- soft, nontender, neg;  Ext- neg w/o c/c/e;  Neuro- intact.   CXR 07/02/16>  CXR is stable/ unchanged; norm heart size, clear lungs, NAD- no change in lymph nodes...  LABS 07/02/16>  Chems- BS=261, A1c=10.5 despite recent incr to 25u Lantus;  BUN=27, Cr=2.02 (ClCr=45) sl worse on his Lasix40;  LFTs are ok & AlkPhos is better- down to 150;  ACE=118 same as prev. IMP/PLAN>>  Brandon Carroll has some serious medical problems- HBP, Cardiomyopathy w/ chr SysCHF, IDDM, renal insuffic;  As noted he also appears to have sarcoidosis w/ mild adenopathy & scattered nodules, elev AlkPhos, elev ACE- but these appear stable & non-progressive, we continue to follow carefully, but his other medical issues (BP, heart, DM, now renal) are much more serious and slowly  progressive... I maintain that he needs aggressive DM management to get control of his BS, PCP & cardiac management of his BP & cardiomyoopathy, and now renal referral for there eval in light of his slowly worsening creat...   ~  December 31, 2016:  9moROV & pulmonary follow up visit for Hx Sarcoidosis>  Supposed to be on Breo one inhalation daily but too $$, has Albut rescue inhaler but hasn't needed... See prob list above...     He saw Nephrology- Dr.Eliz UHollie Salk3/27/18>  HBP, HL, DM (poor control), sarcoid, nonischemic cardiomyopathy w/ chr sysCHF, and Cr~2.0; DrUpton felt that he needed a renal Bx & he was sched for this mult times and no showed- labs showed NEG- ANA/ ANCA/ Complement- C3&C4/ HepB/ HIV/ HepC/ RPR/ SPEP, sl incr both kappa & lambda light chains in urine    He saw DrEllison- DM/ Endocrine 12/24/16>  DM since 1996, insulin since 2014; on INSULIN but note doesn't say what type/ how much/ supposed to be on Lantus50- A1c=13.2 & dose kept the same per note...    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 EXAM shows Afeb, VSS w/ BP=120/80 despite poor med compliance, O2sat=98% on RA;  Heent- neg, mallampati2;  Chest- clear w/o w/r/r;  Heart- RR w/o m/r/g;  Abd- soft, nontender, neg;  Ext- neg w/o c/c/e;  Neuro- intact.   CT Chest 01/02/17> norm heart size, aortic calcif; +mediastinal adenopathy- similar or minimally larger on this study; similar nodular areas of GGO in both apicies (dominant 1.2cm isunchanged), stable 61mRUL nodule, other scattered tiny bilat nodules are similar, no edema/ effusion => NO SUBSTANTIAL INTERVAL CHANGE...  LABS 12/31/16>  Chems- BS=250, Cr=2.33, LFTs=wnl;  CBC- Hg=11.8, mcv=79;  TSH=2.45;  ACE=116 IMP/PLAN>>  Prob sarcoid w/ mediastinal adenopathy & abn CT  Chest, never been biopsied, elev ACE level as noted, not on Pred therapy due to poorly controlled DM & no hard indication for med rx;  HBP, cardiomyopathy on 4 meds- stable, hasn't seen Cards in some time;  DM insulin dependent, poorly compliant & poorly controlled;  Renal insuffic- slowly progressive, pt has not had renal bx planned [facility] DrEstill Dooms [facility]. He needs active involvement from all his physician team members...    ~  July 03, 2017:  43m67moV & Brandon Carroll just had  is gallbladder removed but unfortunately he has missed several Nephrology appts and needs to establish w/ Endocrine-DM and Renal for on-going management of these problems...  He was seeing the PA at Urgent Care for his Primary Care needs but will be establishing w/ [facility]Crawford at [facility] soon...     Prob Sarcoidosis, Abn CT Chest>  SEE CT CHEST 01/02/17, ACE level 12/2016= 116, prev Spirometry w/ prob mild restriction & superimposed small airways dis; he is NOT a candidate for Pred Rx due to his severe IDDM & hx poor compliance w/ medical management; he is supposed to be on BREO one inhalation daily;  He notes that breathing is OK, sl dry cough, no sput, no hemoptysis, denies CP, exerc= walking he says...     CARDS- HBP, Nonischemic Cardiomyopathy w/ chronic systolic CHF and DV=76-16% by 2DEcho> he last saw CARDS- [place] 07/2015; supposed to be on ASA81,  Coreg25Bid, Amlod5Bid, Losar100, Lasix20-2Qam    ENDOCRINE- DM w/ neuropathy & nephropathy, HL; supposed to be on Lantus insulin (BS=236 & A1c=9.1) & Crestor20 (not taking- his TChol=305)...    Renal Insuffic- he missed/ cancelled 5 appts w/ nephrology; his Cr=2.60    Gallstones- s/p cholecystectomy> eval by DrRamirez for CCS w/ Lap Chole done 06/28/17    Medical issues- anemia, poor compliance w/ medical therapy EXAM shows Afeb, VSS w/ BP=130/82 despite poor med compliance, O2sat=98% on RA;  Heent- neg, mallampati2;  Chest- clear w/o w/r/r;  Heart- RR w/o  m/r/g;  Abd- soft, nontender, neg;  Ext- neg w/o c/c/e;  Neuro- intact.  IMP/PLAN>>  Brandon Carroll just had his GB removed by DrRamirez; he has remained non-compliant w/ his medication regimen & medical follow up visits;  This is a good time for him to re-establish care w/ Endocrine, Nephrology, Cardiology, etc;  We reviewed his meds and discussed the importance of compliance w/ his medication regimen... We will plan a pulmonary follow up in 73mow/ CT, PFTs, ACE level...  NOTE:  >505 of this 285m rov was spent in counseling & coordination of care...   Past Medical History:  Diagnosis Date  . Acute systolic CHF (congestive heart failure), NYHA class 2 ([place]/21[place]2016  . Acute-on-chronic kidney injury ([place]/21/2016  . Diabetes mellitus   . Erectile dysfunction   . Hyperlipidemia   . Hypertension     Past Surgical History:  Procedure Laterality Date  . CARDIAC CATHETERIZATION N/A 06/15/2015   Procedure: Left Heart Cath and Coronary Angiography;  Surgeon: Peter M JoMartiniqueMD;  [facility];  Service: Cardiovascular;  Laterality: N/A;  . CHOLECYSTECTOMY N/A 06/28/2017   Procedure: LAPAROSCOPIC CHOLECYSTECTOMY;  Surgeon: RaRalene OkMD;  Location: [facility];  Service: General;  Laterality: N/A;  . HERNIA REPAIR  7th grade   umbilical    Outpatient Encounter Medications as of 07/03/2017  Medication Sig  . Albuterol Sulfate (PROAIR RESPICLICK) [id number] BASE) MCG/ACT AEPB Inhale 2 puffs into the lungs every 6 (six) hours as needed.  . Marland Kitchenspirin EC 81 MG tablet Take 81 mg by mouth daily.  . BD PEN NEEDLE NANO U/F 32G X 4 MM MISC use once daily as directed  . carvedilol (COREG) 25 MG tablet Take 1 tablet (25 mg total) 2 (two) times daily by mouth.  . fluticasone furoate-vilanterol (BREO ELLIPTA) 100-25 MCG/INH AEPB Inhale 1 puff into the lungs daily.  . Marland Kitchenlucose blood (ONETOUCH VERIO) test strip 1 each by Other route 2 (two) times daily as needed for other. And lancets  2/day  .  rosuvastatin (CRESTOR) 20 MG tablet Take 1 tablet (20 mg total) daily by mouth.  . Spacer/Aero-Holding Chambers (AEROCHAMBER MV) inhaler Use as instructed  . traMADol (ULTRAM) 50 MG tablet Take 1 tablet (50 mg total) by mouth every 6 (six) hours as needed.  . [DISCONTINUED] amLODipine (NORVASC) 5 MG tablet Take 1 tablet (5 mg total) 2 (two) times daily by mouth.  . [DISCONTINUED] furosemide (LASIX) 20 MG tablet TAKE 2 TABLETS (40 MG TOTAL) BY MOUTH EVERY MORNING.  . [DISCONTINUED] Insulin Glargine (LANTUS SOLOSTAR) 100 UNIT/ML Solostar Pen Inject 50 Units into the skin every morning. And pen needles 1/day  . [DISCONTINUED] losartan (COZAAR) 100 MG tablet Take 1 tablet (100 mg total) daily by mouth.   No facility-administered encounter medications on file as of 07/03/2017.     Allergies  Allergen Reactions  . Lipitor [Atorvastatin] Hives and Itching    Immunization History  Administered Date(s) Administered  . Influenza, Seasonal, Injecte, Preservative Fre 07/22/2012  . Influenza,inj,Quad PF,6+ Mos 08/04/2014, 07/02/2016, 03/21/2017  . Pneumococcal Polysaccharide-23 07/22/2012, 05/03/2015  . Tdap 05/14/2010    Current Medications, Allergies, Past Medical History, Past Surgical History, Family History, and Social History were reviewed in [employer] record.   Review of Systems             All symptoms NEG except where BOLDED >>  Constitutional:  F/C/S, fatigue, anorexia, unexpected weight change. HEENT:  HA, visual changes, hearing loss, earache, nasal symptoms, sore throat, mouth sores, hoarseness. Resp:  cough, sputum, hemoptysis; SOB, tightness, wheezing. Cardio:  CP, palpit, DOE, orthopnea, edema. GI:  N/V/D/C, blood in stool; reflux, abd pain, distention, gas. GU:  dysuria, freq, urgency, hematuria, flank pain, voiding difficulty. MS:  joint pain, swelling, tenderness, decr ROM; neck pain, back pain, etc. Neuro:  HA, tremors, seizures, dizziness, syncope,  weakness, numbness, gait abn. Skin:  suspicious lesions or skin rash. Heme:  adenopathy, bruising, bleeding. Psyche:  confusion, agitation, sleep disturbance, hallucinations, anxiety, depression suicidal.   Objective:   Physical Exam       Vital Signs:  Reviewed...   General:  WD, WN, 57 y/o BM in NAD; alert & oriented; pleasant & cooperative... HEENT:  [place]/AT; Conjunctiva- pink, Sclera- nonicteric, EOM-wnl, PERRLA, Fundi-benign; EACs-clear, TMs-wnl; NOSE-clear; THROAT-clear & wnl.  Neck:  Supple w/ full ROM; no JVD; normal carotid impulses w/o bruits; no thyromegaly or nodules palpated; no lymphadenopathy.  Chest:  Clear to P & A; without wheezes, rales, or rhonchi heard. Heart:  Regular Rhythm; norm S1 & S2 without murmurs, rubs, or gallops detected. Abdomen:  Soft & nontender- no guarding or rebound; normal bowel sounds; no organomegaly or masses palpated. Ext:  Normal ROM; without deformities or arthritic changes; no varicose veins, venous insuffic, or edema;  Pulses intact w/o bruits. Neuro:  CNs II-XII intact; motor testing normal; sensory testing normal; gait normal & balance OK. Derm:  No lesions noted; no rash etc. Lymph:  No cervical, supraclavicular, axillary, or inguinal adenopathy palpated.   Assessment:      IMP >>    Abn CXR & CTChest, Elev ACE level=> c/w SARCOIDOSIS>  He would like to avoid lung bx if poss & I indicated that I would be willing to treat him w/o based on the strength of the current evidence (CT results + ACE level), but this will require him to under active DM management by an Endocrinologist before Prednisone is started...    HBP> controlled on Rx, continue same.Marland KitchenMarland Kitchen  Nonischemic Cardiomyopathy w/ chronic systolic CHF and YT=24-46% by 2DEcho> managed by Cards, DrRandolph- continue her meds...    Hyperlipidemia> he lists Cres20 currently, he will need f/u FLP by his PCP later...    Diabetes Mellitus> poorly controlled & we reviewed DM diet & need for  active Diabetologist involvement in his care, esp in light of need for Pred rx for the sarcoid...     Elev Alk Phos> this fractionated to liver but GOT/GPT are wnl; PCP did Abd Sonar 07/08/15 showing 3.6 cm GB polyp, no stones, ducts ok & liver ok...    Progressive renal insuffic>  Seen by Nephrology, Estill Dooms who planned renal bx but pt hasn't pursued this yet...    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    Rash>  He saw Derm w/ +KOH & improved w/ their rx...  PLAN >>  06/08/15>  Brandon Carroll appears to have a major heart problem and needs to see Cardiology 1st- we will refer ASAP so they can manage his cardiomyopathy & eval for poss underlying CAD;  From the pulmonary perspective he is a never smoker & has no prior hx of any lung problem;  He has an abn CXR & CT Chest as noted, only mild PFT abnormality & I am not yet ready to commit him to a lung Bx or mediastinoscopy & bx to cement the dx of sarcoidosis;  For now we will switch his Lisinopril to Losartan (so we can check ACE level later) and we will plan ROV in 2-16mow/ CXR at that time (he will need f/u CT at a later date) 08/11/15>  His breathing is much improved on Lasix diuresis & adjustment in BP/ CHF meds- [facility]  F/u labs today show BS=318 on his Glucotrol alone- needs f/u DrEllison for Endocrine;  Anemia labs show Hg back to norm at 13 but cells still small & HAVE ALWAYS BEEN SMALL suggesting prob thallassemia minor, but Folic level sl low so we will rec MVI + OTC Folate... Plan is to see him back in June- time for CXR & f/u CT Chest to check the nodules seen 04/2015...  12/29/15>   Brandon Carroll has a pattern of pulm abnormalities that are concordant w/ the diagnosis of active sarcoidosis at this time; we do not have a biopsy for confirmation but he would like to avoid lung bx if poss & I indicated that I would be willing to treat him for this condition & follow his response very closely so long as he was  also under the care of a diabetic specialist at the same time;  His DM is poorly controlled 7 he will need active management of this condition w/ meds he can afford to fill (ie- prob NPH & Reg inulin or mixed 70/30 or similar) while we place him on Pred & taper it as rapidly as possible... I would like him to see the diabetologist before we start the Pred rx...  07/02/16>    Brandon Carroll has some serious medical problems- HBP, Cardiomyopathy w/ chr SysCHF, IDDM, renal insuffic;  As noted he also appears to have sarcoidosis w/ mild adenopathy & scattered nodules, elev AlkPhos, elev ACE- but these appear stable & non-progressive, we continue to follow carefully, but his other medical issues (BP, heart, DM, now renal) are much more serious and slowly progressive... I maintain that he needs aggressive DM management to get control of his BS, PCP & cardiac management of his BP & cardiomyoopathy, and now  renal referral for there eval in light of his slowly worsening creat...  12/31/16>   Prob sarcoid w/ mediastinal adenopathy & abn CT Chest, never been biopsied, elev ACE level as noted, not on Pred therapy due to poorly controlled DM & no hard indication for med rx;  HBP, cardiomyopathy on 4 meds- stable, hasn't seen Cards in some time;  DM insulin dependent, poorly compliant & poorly controlled;  Renal insuffic- slowly progressive, pt has not had renal bx planned by Estill Dooms;  [facility]P- [facility]... He needs active involvement from all his physician team members. 07/03/17>   Brandon Carroll just had his GB removed by DrRamirez; he has remained non-compliant w/ his medication regimen & medical follow up visits;  This is a good time for him to re-establish care w/ Endocrine, Nephrology, Cardiology, etc;  We reviewed his meds and discussed the importance of compliance w/ his medication regimen... We will plan a pulmonary follow up in 44mow/ CT, PFTs, ACE level.   Plan:     Patient's Medications  New Prescriptions   AMLODIPINE  (NORVASC) 10 MG TABLET    Take 1 tablet (10 mg total) by mouth daily.   AMOXICILLIN-CLAVULANATE (AUGMENTIN) 875-125 MG TABLET    Take 1 tablet by mouth 2 (two) times daily.   CLONIDINE (CATAPRES - DOSED IN MG/24 HR) 0.3 MG/24HR PATCH    Place 1 patch (0.3 mg total) onto the skin once a week.   FUROSEMIDE (LASIX) 40 MG TABLET    Take 1 tablet (40 mg total) by mouth daily.  Previous Medications   ALBUTEROL SULFATE (PROAIR RESPICLICK) 1800(90 BASE) MCG/ACT AEPB    Inhale 2 puffs into the lungs every 6 (six) hours as needed.   ASPIRIN EC 81 MG TABLET    Take 81 mg by mouth daily.   BD PEN NEEDLE NANO U/F 32G X 4 MM MISC    use once daily as directed   CARVEDILOL (COREG) 25 MG TABLET    Take 1 tablet (25 mg total) 2 (two) times daily by mouth.   FLUTICASONE FUROATE-VILANTEROL (BREO ELLIPTA) 100-25 MCG/INH AEPB    Inhale 1 puff into the lungs daily.   GLUCOSE BLOOD (ONETOUCH VERIO) TEST STRIP    1 each by Other route 2 (two) times daily as needed for other. And lancets 2/day   ROSUVASTATIN (CRESTOR) 20 MG TABLET    Take 1 tablet (20 mg total) daily by mouth.   SPACER/AERO-HOLDING CHAMBERS (AEROCHAMBER MV) INHALER    Use as instructed   TRAMADOL (ULTRAM) 50 MG TABLET    Take 1 tablet (50 mg total) by mouth every 6 (six) hours as needed.  Modified Medications   Modified Medication Previous Medication   CONTINUOUS BLOOD GLUC RECEIVER (FREESTYLE LIBRE 14 DAY READER) DEVI Continuous Blood Gluc Receiver (FREESTYLE LIBRE 14 DAY READER) DEVI      USE AS DIRECTED    1 Device by Does not apply route once for 1 dose.   CONTINUOUS BLOOD GLUC SENSOR (FREESTYLE LIBRE 14 DAY SENSOR) MISC Continuous Blood Gluc Sensor (FREESTYLE LIBRE 14 DAY SENSOR) MISC      1 Device by Does not apply route every 14 (fourteen) days.    1 Device by Does not apply route every 14 (fourteen) days.   INSULIN GLARGINE (LANTUS SOLOSTAR) 100 UNIT/ML SOLOSTAR PEN Insulin Glargine (LANTUS SOLOSTAR) 100 UNIT/ML Solostar Pen      Inject 60 Units  into the skin every morning. And pen needles 1/day    Inject  60 Units into the skin every morning. And pen needles 1/day   LOSARTAN (COZAAR) 100 MG TABLET losartan (COZAAR) 100 MG tablet      Take 1 tablet (100 mg total) by mouth daily.    Take 1 tablet (100 mg total) by mouth daily.  Discontinued Medications   AMLODIPINE (NORVASC) 5 MG TABLET    Take 1 tablet (5 mg total) 2 (two) times daily by mouth.   FUROSEMIDE (LASIX) 20 MG TABLET    TAKE 2 TABLETS (40 MG TOTAL) BY MOUTH EVERY MORNING.   INSULIN GLARGINE (LANTUS SOLOSTAR) 100 UNIT/ML SOLOSTAR PEN    Inject 50 Units into the skin every morning. And pen needles 1/day   LOSARTAN (COZAAR) 100 MG TABLET    Take 1 tablet (100 mg total) daily by mouth.

## 2017-12-31 ENCOUNTER — Ambulatory Visit: Payer: Managed Care, Other (non HMO) | Admitting: Pulmonary Disease

## 2017-12-31 ENCOUNTER — Telehealth: Payer: Self-pay | Admitting: Pulmonary Disease

## 2017-12-31 ENCOUNTER — Ambulatory Visit (INDEPENDENT_AMBULATORY_CARE_PROVIDER_SITE_OTHER): Payer: Managed Care, Other (non HMO) | Admitting: Pulmonary Disease

## 2017-12-31 ENCOUNTER — Encounter: Payer: Self-pay | Admitting: Pulmonary Disease

## 2017-12-31 ENCOUNTER — Ambulatory Visit (INDEPENDENT_AMBULATORY_CARE_PROVIDER_SITE_OTHER)
Admission: RE | Admit: 2017-12-31 | Discharge: 2017-12-31 | Disposition: A | Discharge status: Home / Self Care | Payer: Managed Care, Other (non HMO) | Source: Ambulatory Visit | Attending: Emergency Medicine | Admitting: Emergency Medicine

## 2017-12-31 VITALS — BP 152/88 | HR 66 | Temp 97.5°F | Ht 70.0 in | Wt 194.0 lb

## 2017-12-31 DIAGNOSIS — Z794 Long term (current) use of insulin: Secondary | ICD-10-CM

## 2017-12-31 DIAGNOSIS — I429 Cardiomyopathy, unspecified: Secondary | ICD-10-CM

## 2017-12-31 DIAGNOSIS — I5022 Chronic systolic (congestive) heart failure: Secondary | ICD-10-CM

## 2017-12-31 DIAGNOSIS — D631 Anemia in chronic kidney disease: Secondary | ICD-10-CM

## 2017-12-31 DIAGNOSIS — E0821 Diabetes mellitus due to underlying condition with diabetic nephropathy: Secondary | ICD-10-CM

## 2017-12-31 DIAGNOSIS — N184 Chronic kidney disease, stage 4 (severe): Secondary | ICD-10-CM

## 2017-12-31 DIAGNOSIS — I10 Essential (primary) hypertension: Secondary | ICD-10-CM

## 2017-12-31 DIAGNOSIS — D869 Sarcoidosis, unspecified: Secondary | ICD-10-CM

## 2017-12-31 DIAGNOSIS — E1169 Type 2 diabetes mellitus with other specified complication: Secondary | ICD-10-CM

## 2017-12-31 DIAGNOSIS — E785 Hyperlipidemia, unspecified: Secondary | ICD-10-CM

## 2017-12-31 MED ORDER — METOPROLOL TARTRATE 25 MG PO TABS: 25.0000 mg | ORAL_TABLET | Freq: Two times a day (BID) | ORAL | 5 refills | Status: DC

## 2017-12-31 NOTE — Telephone Encounter (Signed)
Spoke with Aaron Edelman at [facility] him that per patient's chart, Coreg and clonidine were discontinued by SN today. He verbalized understanding. Nothing further needed at time of call.

## 2017-12-31 NOTE — Patient Instructions (Signed)
Today we updated your med list in our EPIC system...     We decided to change the COREG to METOPROLOL 50mg  tabs one tab twice daily to see if it helps your BP & avoids the stuffy side effect you note from the coreg...  Today we checked a follow up CXR...    We will contact you w/ the results when available...     It appears as though you still do not need any therapy for the underlying sarcoidosis...  Continue your regular follow up visits w/ DrCrawford (PCP), DrEllison (DM), & Dr Hollie Salk (Nephrology) to control your BP, sugars, and kidney function...  We will arrange for you to see one of my young partners on return in about 6 months...    My best wishes to you always, Bobbie.Marland KitchenMarland Kitchen

## 2018-01-03 ENCOUNTER — Encounter: Payer: Self-pay | Admitting: Pulmonary Disease

## 2018-01-03 NOTE — Progress Notes (Addendum)
Subjective:     Patient ID: Brandon Carroll, male   DOB: 01/24/1961, 57 y.o.   MRN: [id number]  HPI ~  June 08, 2015:  Initial pulmonary consult by SN>        64 y/o man referred by Brandon Mons PA, [facility] for a pulmonary evaluation to r/o Sarcoidosis;  He has a history of a cardiomyopathy, chronic systolic CHF w/ WT=88-82%, HBP, DM, HL, etc;  He was Brandon Carroll 12/19 - 05/05/15 by Brandon Carroll after presenting w/ dyspnea, PND & 2 pillow orthopnea;  CXR showed bilat effusions, some incr markings and prob LLL atx;  2DEcho showed a cardiomyopathy w/ EF=40-45%;  Labs showed elev BNP=971, mild renal insuffic w/ Cr=1.4-1.6, mild anemia w/ Hg=11-12 and small cells w/ MCV=76;  Pt was diuresed w/ Lasix & lost 7# of fluid w/ improvement in his breathing (also given empiric Levaquin rx) and he was disch for outpt follow up... CT Chest performed 05/04/15 showed scattered bilat pulm nodules (largest=642m in RUL) and mild adenopathy (R paratrach/ subcarinal/ aortopulm window/ & ?bilat hilar regions), norm heart size, coronary atherosclerosis noted, bilat effusions and basilar atx...        For his part the patient states that his SOB has completely resolved (w/ treatment of his CHF);  He notes min cough, small amt of clear sput, no hemoptysis, denies f/c/s, no CP;  He notes some DOE but able to perform his duties at [facility] Carroll & down stairs now...   Smoking Hx>  He is a never smoker...  Pulmonary Hx>  He has no prev hx of lung disease- no hx asthma, bronchitis, pneumonia, Tb or exposure.  Medical Hx>  As below-- HBP, CHF/cardiomyopathy (never seen by Cards), DM, HL, RI, anemia...  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  Current Meds>  AlbutHFA prn, Mucinex, Coreg, Calan, Lisinopril, Lasix, Mevacor, Glucotrol EXAM shows Afeb, VSS, O2sat=98% on RA;  Heent- neg, mallampati2;  Chest- clear w/o w/r/r;  Heart- RR  w/o m/r/g;  Abd- soft, nontender, neg;  Ext- neg w/o c/c/e;  Neuro- intact w/o focal deficits...  CXR 05/02/15 showed bilat effusions, some incr markings and prob LLL atx  EKG 05/02/15 showed NSR, rate73/min, +STTWA- consider inferolat ischemia...  2DEcho 05/03/15 showed mild LVH, mod reduced LVF w/ EF=40-45% & diffuse HK; MV & AoV are wnl, LA is mod dilated, RA is mildly dilated  Labs in Epic from 04/2015 showed elev BNP=971, mild renal insuffic w/ Cr=1.4-1.6, mild anemia w/ Hg=11-12 and small cells w/ MCV=76   CT Chest performed 05/04/15 showed scattered bilat pulm nodules (largest=640min RUL) and mild adenopathy (R paratrach/ subcarinal/ aortopulm window/ & ?bilat hilar regions), norm heart size, coronary atherosclerosis noted, bilat effusions and basilar atx...    Spirometry 06/08/15 showed FVC=3.36 (81%), FEV1=2.62 (79%), %1sec=78, mid-flows are sl reduced at 61% predicted; This is c/w very mild airflow obstruction esp in small airways, can't r/o superimposed restriction w/o LV measurement.   Ambulatory Oximetry 06/08/15>  O2sat=100% on RA at rest w/ pulse=66/min; He ambulated 3 laps w/o difficulty, lowest O2sat=99% w/ pulse=74/min...  IMP >>    1) Abn CXR & CTChest w/ scattered bilat pulm nodules (largest=42m47mn RUL) and mild adenopathy seen (R paratrach/ subcarinal/ aortopulm window/ & ?bilat hilar regions)> the Adm CXR 04/2015 appeared to me to be c/w CHF w/ bilat effusions, interstitial edema, and some LLL atx (clinical picture  was not that of pneumonia w/o fever/ purulent sput, normal WBC ct, neg pneumococcal & legionella Ag, etc); radiology wondered about sarcoidosis, pt was on Lisinopril at time of Adm & therefore no ACE level checked => we switched Lisinopril to LOSARTAN100 today...     2) HBP> controlled on Coreg 12.5- 1/2Bid, Calan120Bid, Lisinopril40, Lasix20-2Bid => we switched the ACE to ARB as above...    3) Cardiomyopathy w/ chronic systolic CHF and UX=32-44% by 2DEcho> BNP in  hosp was 971 & he diuresed 7+lbs of fluid w/ improvement in his breathing; he has not seen Cards & they didn't call consult in [place]; CTChest alluded to calcif in coronaries and he needs Cards consult for Cath and on-going treatment for his CHF.Marland KitchenMarland Kitchen     4) Hyperlipidemia> on Mevacor20 per PCP.Marland KitchenMarland Kitchen    5) Diabetes Mellitus> he has seen Brandon Carroll in the past, A1c=7.0 04/2015 Hosp, urinalysis was pos for proteinuria; Prev on LANTUS & Metformin, now just on Glucotrol?    6) Renal insuffic> baseline Cr appears to be ~1.2-1.3, Cr in [place] was 1.4-1.6 w/ diuresis, then back to 1.3    7) Elev Alk Phos> this fractionated to liver but GOT/GPT are wnl    8) Anemia w/ small cells> Hg in Hosp was 11.9 w/ MCV=76; baseline in Epic ~12-13 w/o Iron studies or anemia eval previously done...  PLAN >>     Brandon Carroll appears to have a major heart problem and needs to see Cardiology 1st- we will refer ASAP so they can manage his cardiomyopathy & eval for poss underlying CAD;  From the pulmonary perspective he is a never smoker & has no prior hx of any lung problem;  He has an abn CXR & CT Chest as noted, only mild PFT abnormality & I am not yet ready to commit him to a lung Bx or mediastinoscopy & bx to cement the dx of sarcoidosis;  For now we will switch his Lisinopril to Losartan (so we can check ACE level later) and we will plan ROV in 2-39mow/ CXR at that time (he will need f/u CT at a later date).  ~  August 11, 2015:  247moOV & Brandon Carroll saw CARDS, Brandon Carroll & her notes are reviewed> Hypertensive heart dis, chr sys & diastolic CHF, mult coronary risk factors;  They adjusted his BP meds & did a heart CATH 06/15/15 showing trivial CAD w/ 10% LAD & RCA, and elev LV filling pressures;  DOE w/ min exertion persisted and his Lasix was adjusted w/ improvement;  Off the ACE inhibitor his ACE level was checked and ret at 41 (8-52) confirming our opinion that he does not have active sarcoidosis;  He was placed on Breo daily & Albut HFA for  prn use- ?any benefit... We reviewed the following medical problems during today's office visit >>     Abn CXR & CTChest w/ scattered bilat pulm nodules (largest=44m444mn RUL) and mild adenopathy seen (R paratrach/ subcarinal/ aortopulm window/ & ?bilat hilar regions)> the Adm CXR appeared to me to be c/w CHF w/ bilat effusions, interstitial edema, and some LLL atx (clinical picture was not that of pneumonia w/o fever/ purulent sput, normal WBC ct, neg pneumococcal & legionella Ag, etc); radiology wondered about sarcoidosis, pt was on Lisinopril at time of Adm & therefore no ACE level checked => we switched Lisinopril to LOSARTAN100 & checked ACE level 07/05/15=> 41 (norm8-52) confirming our clinical impression of no active sarcoid... He wqas placed on BREO & ALbutHFA rescue  inhaler in the interim as a trial- no real change noted (Spirometry 05/2015 was OK x mild small airways dis).    HBP> better controlled on Coreg 25Bid, Amlod5, Losar100, Lasix40/d;  BP= 146/94 and he says breathing is improved, feeling better, no CP/ palpit/ edema...    Cardiomyopathy w/ chronic systolic CHF and LX=72-62% by 2DEcho> BNP in hosp was 971 & he diuresed 7+lbs of fluid w/ improvement in his breathing; CTChest alluded to calcif in coronaries and he was seen by Ivar Drape- referred for CATH 06/10/15 showing triv CAD (10% LAD & RCA) and elev LV filling pressures c/w a non-ischemic cardiomyopathy;  He is improved w/ better BP control & CHF rx per Cards...    Hyperlipidemia> prev on Mevacor20 w/ FLP 07/26/15 showing TChol 254, TG 109, HDL 74, LDL 158; Brandon Carroll changed Mevacor20 to [place]...    Diabetes Mellitus> he has seen Brandon Carroll in the past, A1c=7.0 04/2015 Hosp, urinalysis was pos for proteinuria; Prev on LANTUS & Metformin, now just on Glucotrol10Bid, BS=150-160 w/ A1c=7.4 (06/21/15)    Renal insuffic> baseline Cr appears to be ~1.2-1.3, Cr in [place] was 1.4-1.6 w/ diuresis ^ improvement in CHF...    Elev Alk Phos> this  fractionated to liver but GOT/GPT are wnl; PCP did Abd Sonar 07/08/15 showing 3.6 cm GB polyp, no stones, ducts ok & liver ok...    Anemia w/ small cells> Hg in Hosp was 11.9 w/ MCV=76; baseline in Epic ~12-13; repeat labs here 08/11/15=> Hb=13.1, MCV=77, Fe=89 (30%sat), Ferritin=60, B12=489, Folate=5.0 & rec OTC MVI + Folic Acid...    Rash>  He saw [facility] who felt rash was cutaneous fungal infection, not sarcoid, KOH was pos & rash resolved w/ Fluconazole & topical cream... EXAM shows Afeb, VSS (BP=146/94), O2sat=99% on RA;  Heent- neg, mallampati2;  Chest- clear w/o w/r/r;  Heart- RR w/o m/r/g;  Abd- soft, nontender, neg;  Ext- neg w/o c/c/e;  Neuro- intact w/o focal deficits...  LABS 08/11/15>  Chems- BS=318, Cr=1.62;  Hb=13.1, MCV=77, Fe=89 (30%sat), Ferritin=60, B12=489, Folate=5.0 & rec OTC MVI + Folic Acid... IMP/PLAN >>  His breathing is much improved on Lasix diuresis & adjustment in BP/ CHF meds- doubt much benefit from [facility];  F/u labs today show BS=318 on his Glucotrol alone- needs f/u Brandon Carroll for Endocrine;  Anemia labs show Hg back to norm at 13 but cells still small & HAVE ALWAYS BEEN SMALL suggesting prob thallassemia minor, but Folic level sl low so we will rec MVI + OTC Folate... Plan is to see him back in June- time for CXR & f/u CT Chest to check the nodules seen 04/2015...   ~  December 29, 2015:  34moROV & JDmoniis doing reasonably well from the pulm standpoint- notes sl dry cough, OTC Delsym helps, no SOB/ CP/ palpit/ sput/ hemoptysis/ f/c/s etc;  He is exercising at a gym on the treadmill he says;; Meds costs are a real issue for him- he ran out of Breo & couldn't afford refill;  [facility] >300 w/ A1c=11.3 & they added FWilder Glade(which he could not afford to refill)... We reviewed the following medical problems during today's office visit >>     Abn CXR & CTChest, Elev ACE level=> c/w SARCOIDOSIS>   ~  04/2015 Hosp>  CTChest  04/2015 showed scattered bilat pulm nodules (largest=635min RUL) and mild adenopathy (R paratrach/ subcarinal/ aortopulm window/ & ?bilat hilar regions); CXR was c/w  CHF w/ bilat effusions, interstitial edema, and some LLL atx (clinical picture was not that of pneumonia w/o fever/ purulent sput, normal WBC ct, neg pneumococcal & legionella Ag, etc); radiology wondered about sarcoidosis, pt was on Lisinopril at that time & therefore no ACE level checked =>  ~  06/08/15 consult>  we switched Lisinopril to LOSARTAN100 & checked ACE level 07/05/15=> 41 (norm8-52) suggesting no active sarcoid...  ~  07/2015>  He was placed on BREO & ALbutHFA rescue inhaler in the interim as a trial- no real change noted & too $$ (Spirometry 05/2015 was OK x mild small airways dis)...  ~  12/29/15>  Due for f/u CT Chest (+bilat patchy GGO & nodularity w/ adenopathy c/w sarcoid), and ACE level (elev >100 at this time)=> all c/w active sarcoid at this time, consider options for treatment w/ or w/o lung bx...    HBP> controlled on Coreg 25Bid, Amlod5Bid, Losartan100, Lasix40/d;  BP= 136/82 and he says breathing is at baseline, feeling well- no CP/ palpit/ edema...    Nonischemic Cardiomyopathy w/ chronic systolic CHF and HU=31-49% by 2DEcho> BNP in hosp 04/2015 was 971 & he diuresed 7+lbs of fluid w/ improvement in his breathing; CTChest alluded to calcif in coronaries and he was seen by Ivar Drape 05/2015- CATH showing triv CAD (10% LAD & RCA) and elev LV filling pressures c/w a non-ischemic cardiomyopathy;  He is improved w/ better BP control & CHF rx per Cards...    Hyperlipidemia> prev on Mevacor20 w/ FLP 07/26/15 showing TChol 254, TG 109, HDL 74, LDL 158; Brandon Carroll changed Mevacor20 to [place] & now on Crestor20...    Diabetes Mellitus> seen Brandon Carroll 11/2014 & placed on Lantus/ Humalog; pt couldn't afford & never followed up, 04/2015 Hosp- A1c=7.0 & disch on Glucotrol 10Bid w/ BS=150-160 w/ A1c=7.4 (06/21/15); but since then BS>300 &  A1c>11.0 and he desperately needs Endocrine management of DM w/ need for Prednisone Rx of his Sarcoid!    Renal insuffic> baseline Cr appears to be ~1.2-1.3, Cr in [place] was 1.4-1.6 w/ diuresis & improvement in CHF; most recent Cr= 1.6-1.8 range...    Elev Alk Phos> this fractionated to liver but GOT/GPT are wnl; PCP did Abd Sonar 07/08/15 showing 3.6 cm GB polyp, no stones, ducts ok & liver ok...    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    Rash>  He saw [facility] who felt rash was cutaneous fungal infection, not sarcoid, KOH was pos & rash resolved w/ Fluconazole & topical cream... EXAM shows Afeb, VSS, O2sat=97% on RA;  Heent- neg, mallampati2;  Chest- clear w/o w/r/r;  Heart- RR w/o m/r/g;  Abd- soft, nontender, neg;  Ext- neg w/o c/c/e;  Neuro- intact w/o focal deficits...  CXR 12/29/15>  Norm heart size & vascularity, clear lungs, no effusions, sl fullness in hilum, NAD...  LABS 12/29/15>  Chems- BS=348, A1c=11.0, BUN=27. Cr=1.78;  Sed=63;  ACE>100...  CT Chest done 01/03/16>  Norm heart size, Ao & coronary atherosclerosis, norm caliber PAs, mildly enlarged mediastinal & hilar nodes, scat bilat mid & upper lobe nodularity w/ dominant 26m RUL nodule unchanged, mild patchy GGO in bilat upper lobes w/ dominant LUL 1.2cm nodule which is new => all felt to be c/w sarcoid. IMP/PLAN>>  Brandon Carroll has a pattern of pulm abnormalities that are concordant w/ the diagnosis of active  sarcoidosis at this time; we do not have a biopsy for confirmation but he would like to avoid lung bx if poss & I indicated that I would be willing to treat him for this condition & follow his response very closely so long as he was also under the care of a diabetic specialist at the same time;  His DM is poorly controlled & he will need active  management of this condition w/ meds he can afford to fill (ie- prob NPH & Reg inulin or mixed 70/30 or similar) while we place him on Pred & taper it as rapidly as possible... I would like him to see the diabetologist before we consider starting the Pred rx...   ~  July 02, 2016:  75moROV & JQuinntenhas established w/ Brandon Carroll for DM management, unfortunately his control remains poor- a combination of his severe disease and poor compliance, now w/ slowly progressive renal insuffic & needs to establish w/ Nephrology; fortunately his presumed sarcoid is stable w/o signs of progressive pulmonary or reticuloendothelial disease...  ~  Since he was here last he has seen Brandon Carroll twice- 1st was 02/08/16 & 2nd 06/18/16> on LANTUS 10=> incr to 25u recently w/ LABS showing A1c=9.6..Marland Kitchen  ~  His PCP is listed as [facility] [place]- [place] but he hasn't been seen in some time (last 5/17)...    HBP> controlled on Coreg 25Bid, Amlod5Bid, Losartan100, Lasix40/d;  BP= 136/82 and he says breathing is at baseline, feeling well- no CP/ palpit/ edema...    Nonischemic Cardiomyopathy w/ chronic systolic CHF and EZO=10-96%by 2DEcho> BNP in hosp 04/2015 was 971 & he diuresed 7+lbs of fluid w/ improvement in his breathing; CTChest alluded to calcif in coronaries and he was seen by DIvar Drape1/2017- CATH showing triv CAD (10% LAD & RCA) and elev LV filling pressures c/w a non-ischemic cardiomyopathy;  He is improved w/ better BP control & CHF rx per Cards...    Hyperlipidemia> prev on Mevacor20 w/ FLP 07/26/15 showing TChol 254, TG 109, HDL 74, LDL 158; Brandon Carroll changed Mevacor20 to [place]& now on Crestor20...    Diabetes Mellitus> seen Brandon Carroll 11/2014 & placed on Lantus/ Humalog; pt couldn't afford & never followed up, 04/2015 Hosp- A1c=7.0 & disch on Glucotrol 10Bid w/ BS=150-160 w/ A1c=7.4 (06/21/15); but since then BS>300 & A1c>11.0 and he desperately needs Endocrine management of DM w/ need for Prednisone Rx of his  Sarcoid!    Renal insuffic> baseline Cr appears to be ~1.2-1.3, [place]/ di[place] & improvement in CHF; most recent Cr= 1.6-1.8 range...    Elev Alk Phos> this fractionated to liver but GOT/GPT are wnl; PCP did Abd Sonar 07/08/15 showing 3.6 cm GB polyp, no stones, ducts ok & liver ok...    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    Rash>  He saw [facility] who felt rash was cutaneous fungal infection, not sarcoid, KOH was pos & rash resolved w/ Fluconazole & topical cream...    Poor compliance w/ medications> I called [facility] [employer] on [address] (he gets is ral meds here & no Breo since 8/17, last filled Coreg&Losar 11/17 (154mo Lasix 11/17 (55m30moAmlod1/18 (55mo75moe gets his insulin at [facility] [employer] Lantus 10u/d-55mo 56moly 02/13/16, then new rx for 25u/d filled 06/23/16 (73mo s76moy)...Marland KitchenMarland Kitchen  EXAM shows Afeb, VSS w/ BP=150/90 despite poor med compliance, O2sat=100% on RA;  Heent- neg, mallampati2;  Chest- clear w/o w/r/r;  Heart- RR w/o m/r/g;  Abd- soft, nontender, neg;  Ext- neg w/o c/c/e;  Neuro- intact.   CXR 07/02/16>  CXR is stable/ unchanged; norm heart size, clear lungs, NAD- no change in lymph nodes...  LABS 07/02/16>  Chems- BS=261, A1c=10.5 despite recent incr to 25u Lantus;  BUN=27, Cr=2.02 (ClCr=45) sl worse on his Lasix40;  LFTs are ok & AlkPhos is better- down to 150;  ACE=118 same as prev. IMP/PLAN>>  Brandon Carroll has some serious medical problems- HBP, Cardiomyopathy w/ chr SysCHF, IDDM, renal insuffic;  As noted he also appears to have sarcoidosis w/ mild adenopathy & scattered nodules, elev AlkPhos, elev ACE- but these appear stable & non-progressive, we continue to follow carefully, but his other medical issues (BP, heart, DM, now renal) are much more serious and slowly  progressive... I maintain that he needs aggressive DM management to get control of his BS, PCP & cardiac management of his BP & cardiomyoopathy, and now renal referral for there eval in light of his slowly worsening creat...   ~  December 31, 2016:  77moROV & pulmonary follow up visit for Hx Sarcoidosis>  Supposed to be on Breo one inhalation daily but too $$, has Albut rescue inhaler but hasn't needed... See prob list above...     He saw Nephrology- Dr.Eliz UHollie Salk3/27/18>  HBP, HL, DM (poor control), sarcoid, nonischemic cardiomyopathy w/ chr sysCHF, and Cr~2.0; DrUpton felt that he needed a renal Bx & he was sched for this mult times and no showed- labs showed NEG- ANA/ ANCA/ Complement- C3&C4/ HepB/ HIV/ HepC/ RPR/ SPEP, sl incr both kappa & lambda light chains in urine    He saw Brandon Carroll- DM/ Endocrine 12/24/16>  DM since 1996, insulin since 2014; on INSULIN but note doesn't say what type/ how much/ supposed to be on Lantus50- A1c=13.2 & dose kept the same per note...    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 EXAM shows Afeb, VSS w/ BP=120/80 despite poor med compliance, O2sat=98% on RA;  Heent- neg, mallampati2;  Chest- clear w/o w/r/r;  Heart- RR w/o m/r/g;  Abd- soft, nontender, neg;  Ext- neg w/o c/c/e;  Neuro- intact.   CT Chest 01/02/17> norm heart size, aortic calcif; +mediastinal adenopathy- similar or minimally larger on this study; similar nodular areas of GGO in both apicies (dominant 1.2cm isunchanged), stable 679mRUL nodule, other scattered tiny bilat nodules are similar, no edema/ effusion => NO SUBSTANTIAL INTERVAL CHANGE...  LABS 12/31/16>  Chems- BS=250, Cr=2.33, LFTs=wnl;  CBC- Hg=11.8, mcv=79;  TSH=2.45;  ACE=116 IMP/PLAN>>  Prob sarcoid w/ mediastinal adenopathy & abn CT  Chest, never been biopsied, elev ACE level as noted, not on Pred therapy due to poorly controlled DM & no hard indication for med rx;  HBP, cardiomyopathy on 4 meds- stable, hasn't seen Cards in some time;  DM insulin dependent, poorly compliant & poorly controlled;  Renal insuffic- slowly progressive, pt has not had renal bx planned by DrEstill Dooms [facility]CP[facility]. He needs active involvement from all his physician team members...   ~  July 03, 2017:  37m437moV & Brandon Carroll just had is  gallbladder removed but unfortunately he has missed several Nephrology appts and needs to establish w/ Endocrine-DM and Renal for on-going management of these problems...  He was seeing the PA at Urgent Care for his Primary Care needs but will be establishing w/ DrCrawford at [facility] office soon...     Prob Sarcoidosis, Abn CT Chest>  SEE CT CHEST 01/02/17, ACE level 12/2016= 116, prev Spirometry w/ prob mild restriction & superimposed small airways dis; he is NOT a candidate for Pred Rx due to his severe IDDM & hx poor compliance w/ medical management; he is supposed to be on BREO one inhalation daily;  He notes that breathing is OK, sl dry cough, no sput, no hemoptysis, denies CP, exerc= walking he says...     CARDS- HBP, Nonischemic Cardiomyopathy w/ chronic systolic CHF and OZ=30-86% by 2DEcho> he last saw CARDS- [place] 07/2015; supposed to be on ASA81,  Coreg25Bid, Amlod5Bid, Losar100, Lasix20-2Qam    ENDOCRINE- DM w/ neuropathy & nephropathy, HL; supposed to be on Lantus insulin (BS=236 & A1c=9.1) & Crestor20 (not taking- his TChol=305)...    Renal Insuffic- he missed/ cancelled 5 appts w/ nephrology; his Cr=2.60    Gallstones- s/p cholecystectomy> eval by DrRamirez for CCS w/ Lap Chole done 06/28/17    Medical issues- anemia, poor compliance w/ medical therapy EXAM shows Afeb, VSS w/ BP=130/82 despite poor med compliance, O2sat=98% on RA;  Heent- neg, mallampati2;  Chest- clear w/o w/r/r;  Heart- RR w/o m/r/g;   Abd- soft, nontender, neg;  Ext- neg w/o c/c/e;  Neuro- intact.  IMP/PLAN>>  Nery just had his GB removed by DrRamirez; he has remained non-compliant w/ his medication regimen & medical follow up visits;  This is a good time for him to re-establish care w/ Endocrine, Nephrology, Cardiology, etc;  We reviewed his meds and discussed the importance of compliance w/ his medication regimen... We will plan a pulmonary follow up in 64mow/ CT, PFTs, ACE level...    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 He notes that breathing is good "except in the hot weather" w/ min cough, no sputum, no hemoptysis, no f/c/s, no CP, etc; he denies SOB & says he exercises by waking- using a fitbit, 2 miles 2-3x per [place] [facility]... We reviewed the following interval medical notes avail in Epic-EMR>      He has re-established w/ NEPHROLOGY- DrUpton>  Seen 07/30/17 ~17m50moter our last visit- HBP, CHF, DM, sarcoid; baseline Cr~1.7 in 2017 but steadily trending up to 3.05 now; he refused prev rec for renal bx; hx poor medication compliance- BP meds and DM treatment; BP=156/94, BS=311, Cr=3.05 w/ proteinuria;  Most recent NEPHROLOGY f/u was 11/25/17>  Creat stable at 3.06- they stressed optimal management of BP & DM, avoid NSAIDs etc; question of sarcoid in kidney but we've been unwilling to consider any poss steroid rx until he shows evid of good compliance w/ medical follow up and w/ prescribed medication...    He saw ENDOCRINE- Brandon Carroll last on 11/28/17>  He's been on prescribed insulin since 2014 w/ poor compliance; BP was elev at 198/108, A1c=7.9, Cr=3.06; on Lantus60u/d and no changes made...    He saw PCP-  DrCrawford on 12/26/17>   BP still running high on prescribed rx of Coreg25Bid (doen't like this med "it stops me up"),  Amlod10,  Losar100,  Lasix40;  BP=180/110 and she added Clonidine 0.20m  Patch Qwk... NOTE: BP improved at our OV today= 152/88 and we discussed need for 100% compliance w/ meds... We reviewed the following medical problems during today's office visit>      Abn CTChest and elev ACE c/w sarcoidosis> he has been essentially asymptomatic from the pulm standpoint (after rx for his CHF in 2017) & I have been unwilling to consider Pred rx in light of his IDDM- difficult control coupled w/ his hx of poor compliance; doubt renal dis related to sarcoid due to his other prevalent more common etiologies including HBP & IDDM;  PFT last in 2017, CXR done today- NAD, CTChest last 12/2016 w/o change from 2017, ACE checked today= 116 (no change).    HBP> difficult control on Coreg 25Bid, Amlod10, Losartan100, Lasix40/d; DrCrawford added Clonidine 0.3 patch weekly; BP improved at 152/88 but he will not take the Coreg due to c/o "stopping up" & insists on change in med- OK to switch to METOPROLOL 528mid & we will have him f/u w/ DrCrawford...    Nonischemic Cardiomyopathy w/ chronic systolic CHF and EFZD=63-87%y 2DEcho> BNP in hosp 04/2015 was 971 & he diuresed 7+lbs of fluid w/ improvement in his breathing; CTChest alluded to calcif in coronaries and he was seen by DrIvar Drape/2017- CATH showing triv CAD (10% LAD & RCA) and elev LV filling pressures c/w a non-ischemic cardiomyopathy;  He is improved w/ better BP control & CHF rx per Cards...    Hyperlipidemia> prev on Mevacor20 w/ FLP 07/26/15 showing TChol 254, TG 109, HDL 74, LDL 158; Brandon Carroll changed Mevacor20 to Atorva40 & now on Crestor20 w/ FLP followed by PCP.    Diabetes Mellitus> he saw Brandon Carroll 11/2014 & placed on Lantus/ Humalog; pt couldn't afford & never followed up, 04/2015 Hosp- A1c=7.0 & disch on Glucotrol 10Bid w/ BS=150-160 w/ A1c=7.4 (06/21/15); but since then  BS>300 & A1c>11.0 and he referred back for Endocrine management (note- never been on Pred rx); now followed by Brandon Carroll on LANTUS 60u daily + diet & exercise; last A1c was 11/2017 = 7.9    Renal insuffic> baseline Cr appears to be ~1.2-1.3, Cr in Hosp 2017 was 1.4-1.6 w/ diuresis & improvement in CHF; then Cr steadily worse over the year: 2.0 (2.2018), 2.31 (03/2017), 2.60 (06/2017), 3.06 (10/2017), 3.89 (12/2017)...    Elev Alk Phos> this fractionated to liver but GOT/GPT are wnl; PCP did Abd Sonar 07/08/15 showing 3.6 cm GB polyp, no stones, ducts ok & liver ok...    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    HxRash>  He saw [facility] who felt rash was cutaneous fungal infection, not sarcoid, KOH was pos & rash resolved w/ Fluconazole & topical cream... EXAM shows Afeb, VS w/ BP=152/88, O2sat=98% on RA;  Heent- neg, mallampati2;  Chest- clear w/o w/r/r;  Heart- RR w/o m/r/g;  Abd- soft, nontender, neg;  Ext- neg w/o c/c/e;  Neuro- intact.   CXR 12/31/17 (independently reviewed by me in the PACS system) shows borderline cardiomeg & atherosclerotic aortic calcif, prom right hilum suggesting adenopathy, clear lungs- NAD...  LABS 12/2013 in Epic>  Chems w/ K=4.5, BS=163, Cr=3.89;  A1c 11/2017 = 7.9 by Brandon Carroll... 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 but continues to be relatively asymptomatic from the pulm perspective and not a candidate for steroid therapy given his other medical issues (HBP & IDDM); doubt renal dis from sarcoid in light of his other more common etiologies and he has categorically refused biopsies; he is progressing  toward end-stage renal dis & need for dialysis;  He needs to work diligently toward optimal BP & DM control- continue management [place] Dr[place]; I would like him to maintain pulm follow up as well- as I will be retiring in Jan2020 I will arrange for pulm f/u with one of my young partners in about 62m10moooner if needed for any breathing issues...     Past Medical History:  Diagnosis Date  . Acute systolic CHF (congestive heart failure), NYHA class 2 ([place]2/21/2016  . Acute-on-chronic kidney injury (HCCT[place]2/21/2016  . Diabetes mellitus   . Erectile dysfunction   . Hyperlipidemia   . Hypertension     Past Surgical History:  Procedure Laterality Date  . CARDIAC CATHETERIZATION N/A 06/15/2015   Procedure: Left Heart Cath and Coronary Angiography;  Surgeon: Peter M JorMartiniqueD;  Location: [facility];  Service: Cardiovascular;  Laterality: N/A;  . CHOLECYSTECTOMY N/A 06/28/2017   Procedure: LAPAROSCOPIC CHOLECYSTECTOMY;  Surgeon: RamRalene OkD;  Location: [facility];  Service: General;  Laterality: N/A;  . HERNIA REPAIR  7th grade   umbilical    Outpatient Encounter Medications as of 12/31/2017  Medication Sig  . Albuterol Sulfate (PROAIR RESPICLICK) 1080810 BASE) MCG/ACT AEPB Inhale 2 puffs into the lungs every 6 (six) hours as needed.  . aMarland KitchenLODipine (NORVASC) 10 MG tablet Take 1 tablet (10 mg total) by mouth daily.  . aMarland Kitchenoxicillin-clavulanate (AUGMENTIN) 875-125 MG tablet Take 1 tablet by mouth 2 (two) times daily.  . aMarland Kitchenpirin EC 81 MG tablet Take 81 mg by mouth daily.  . BD PEN NEEDLE NANO U/F 32G X 4 MM MISC use once daily as directed  . cloNIDine (CATAPRES - DOSED IN MG/24 HR) 0.3 mg/24hr patch Place 1 patch (0.3 mg total) onto the skin once a week.  . Continuous Blood Gluc Receiver (FREESTYLE LIBRE 14 DAY READER) DEVI USE AS DIRECTED  . fluticasone furoate-vilanterol (BREO ELLIPTA) 100-25 MCG/INH AEPB Inhale 1 puff into the lungs daily.  . furosemide  (LASIX) 40 MG tablet Take 1 tablet (40 mg total) by mouth daily.  . gMarland Kitchenucose blood (ONETOUCH VERIO) test strip 1 each by Other route 2 (two) times daily as needed for other. And lancets 2/day  . Insulin Glargine (LANTUS SOLOSTAR) 100 UNIT/ML Solostar Pen Inject 60 Units into the skin every morning. And pen needles 1/day  . losartan (COZAAR) 100 MG tablet Take 1 tablet (100 mg total) by mouth daily.  . rosuvastatin (CRESTOR) 20 MG tablet Take 1 tablet (20 mg total) daily by mouth.  . Spacer/Aero-Holding Chambers (AEROCHAMBER MV) inhaler Use as instructed  . [DISCONTINUED] carvedilol (COREG) 25 MG tablet Take 1 tablet (25 mg total) 2 (two) times daily by mouth.  . [DISCONTINUED] Continuous Blood Gluc Sensor (FREESTYLE LIBRE 14 DAY SENSOR) MISC 1 Device by Does not apply route every 14 (fourteen) days.  . [DISCONTINUED] traMADol (ULTRAM) 50 MG tablet Take 1  tablet (50 mg total) by mouth every 6 (six) hours as needed.  . metoprolol tartrate (LOPRESSOR) 25 MG tablet Take 1 tablet (25 mg total) by mouth 2 (two) times daily.   No facility-administered encounter medications on file as of 12/31/2017.     Allergies  Allergen Reactions  . Lipitor [Atorvastatin] Hives and Itching    Immunization History  Administered Date(s) Administered  . Influenza, Seasonal, Injecte, Preservative Fre 07/22/2012  . Influenza,inj,Quad PF,6+ Mos 08/04/2014, 07/02/2016, 03/21/2017  . Pneumococcal Polysaccharide-23 07/22/2012, 05/03/2015  . Tdap 05/14/2010    Current Medications, Allergies, Past Medical History, Past Surgical History, Family History, and Social History were reviewed in [employer] record.   Review of Systems             All symptoms NEG except where BOLDED >>  Constitutional:  F/C/S, fatigue, anorexia, unexpected weight change. HEENT:  HA, visual changes, hearing loss, earache, nasal symptoms, sore throat, mouth sores, hoarseness. Resp:  cough, sputum, hemoptysis; SOB,  tightness, wheezing. Cardio:  CP, palpit, DOE, orthopnea, edema. GI:  N/V/D/C, blood in stool; reflux, abd pain, distention, gas. GU:  dysuria, freq, urgency, hematuria, flank pain, voiding difficulty. MS:  joint pain, swelling, tenderness, decr ROM; neck pain, back pain, etc. Neuro:  HA, tremors, seizures, dizziness, syncope, weakness, numbness, gait abn. Skin:  suspicious lesions or skin rash. Heme:  adenopathy, bruising, bleeding. Psyche:  confusion, agitation, sleep disturbance, hallucinations, anxiety, depression suicidal.   Objective:   Physical Exam       Vital Signs:  Reviewed...   General:  WD, WN, 57 y/o BM in NAD; alert & oriented; pleasant & cooperative... HEENT:  [place]/AT; Conjunctiva- pink, Sclera- nonicteric, EOM-wnl, PERRLA, Fundi-benign; EACs-clear, TMs-wnl; NOSE-clear; THROAT-clear & wnl.  Neck:  Supple w/ full ROM; no JVD; normal carotid impulses w/o bruits; no thyromegaly or nodules palpated; no lymphadenopathy.  Chest:  Clear to P & A; without wheezes, rales, or rhonchi heard. Heart:  Regular Rhythm; norm S1 & S2 without murmurs, rubs, or gallops detected. Abdomen:  Soft & nontender- no guarding or rebound; normal bowel sounds; no organomegaly or masses palpated. Ext:  Normal ROM; without deformities or arthritic changes; no varicose veins, venous insuffic, or edema;  Pulses intact w/o bruits. Neuro:  CNs II-XII intact; motor testing normal; sensory testing normal; gait normal & balance OK. Derm:  No lesions noted; no rash etc. Lymph:  No cervical, supraclavicular, axillary, or inguinal adenopathy palpated.   Assessment:      IMP >>    Abn CXR & CTChest, Elev ACE level=> c/w SARCOIDOSIS>  He would like to avoid lung bx if poss & I indicated that I would be willing to treat him w/o based on the strength of the current evidence (CT results + ACE level), but this will require him to under active DM management by an Endocrinologist before Prednisone is started...    HBP>  controlled on Rx, continue same...    Nonischemic Cardiomyopathy w/ chronic systolic CHF and OK=59-97% by 2DEcho> managed by Cards, Brandon Carroll- continue her meds...    Hyperlipidemia> he lists Cres20 currently, he will need f/u FLP by his PCP later...    Diabetes Mellitus> poorly controlled & we reviewed DM diet & need for active Diabetologist involvement in his care, esp in light of need for Pred rx for the sarcoid...     Elev Alk Phos> this fractionated to liver but GOT/GPT are wnl; PCP did Abd Sonar 07/08/15 showing 3.6 cm GB polyp, no stones,  ducts ok & liver ok...    Progressive renal insuffic>  Seen by Nephrology, Estill Dooms who planned renal bx but pt hasn't pursued this yet...    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    Rash>  He saw Derm w/ +KOH & improved w/ their rx...  PLAN >>  06/08/15>  Brandon Carroll appears to have a major heart problem and needs to see Cardiology 1st- we will refer ASAP so they can manage his cardiomyopathy & eval for poss underlying CAD;  From the pulmonary perspective he is a never smoker & has no prior hx of any lung problem;  He has an abn CXR & CT Chest as noted, only mild PFT abnormality & I am not yet ready to commit him to a lung Bx or mediastinoscopy & bx to cement the dx of sarcoidosis;  For now we will switch his Lisinopril to Losartan (so we can check ACE level later) and we will plan ROV in 2-30mow/ CXR at that time (he will need f/u CT at a later date) 08/11/15>  His breathing is much improved on Lasix diuresis & adjustment in BP/ CHF meds- [facility]  F/u labs today show BS=318 on his Glucotrol alone- needs f/u Brandon Carroll for Endocrine;  Anemia labs show Hg back to norm at 13 but cells still small & HAVE ALWAYS BEEN SMALL suggesting prob thallassemia minor, but Folic level sl low so we will rec MVI + OTC Folate... Plan is to see him back in June- time for CXR & f/u CT Chest to check the nodules seen 04/2015...  12/29/15>    Can has a pattern of pulm abnormalities that are concordant w/ the diagnosis of active sarcoidosis at this time; we do not have a biopsy for confirmation but he would like to avoid lung bx if poss & I indicated that I would be willing to treat him for this condition & follow his response very closely so long as he was also under the care of a diabetic specialist at the same time;  His DM is poorly controlled 7 he will need active management of this condition w/ meds he can afford to fill (ie- prob NPH & Reg inulin or mixed 70/30 or similar) while we place him on Pred & taper it as rapidly as possible... I would like him to see the diabetologist before we start the Pred rx...  07/02/16>    Brandon Carroll has some serious medical problems- HBP, Cardiomyopathy w/ chr SysCHF, IDDM, renal insuffic;  As noted he also appears to have sarcoidosis w/ mild adenopathy & scattered nodules, elev AlkPhos, elev ACE- but these appear stable & non-progressive, we continue to follow carefully, but his other medical issues (BP, heart, DM, now renal) are much more serious and slowly progressive... I maintain that he needs aggressive DM management to get control of his BS, PCP & cardiac management of his BP & cardiomyoopathy, and now renal referral for there eval in light of his slowly worsening creat...  12/31/16>   Prob sarcoid w/ mediastinal adenopathy & abn CT Chest, never been biopsied, elev ACE level as noted, not on Pred therapy due to poorly controlled DM & no hard indication for med rx;  HBP, cardiomyopathy on 4 meds- stable, hasn't seen Cards in some time;  DM insulin dependent, poorly compliant & poorly controlled;  Renal insuffic- slowly progressive, pt has not had renal bx planned by DEstill Dooms  [facility] He needs active involvement  from all his physician team members. 07/03/17>   Brandon Carroll just had his GB removed by DrRamirez; he has remained non-compliant w/ his medication regimen & medical follow up visits;  This  is a good time for him to re-establish care w/ Endocrine, Nephrology, Cardiology, etc;  We reviewed his meds and discussed the importance of compliance w/ his medication regimen... We will plan a pulmonary follow up in 467mow/ CT, PFTs, ACE level. 12/31/17>   [place] his Coreg (he is quite sure it is causing his nasal congestion) and we will switch this to METOPROLOL515mid- rec f/u BP check w/ DrCrawford in 67m167moHe very likely has pulm sarcoid but continues to be relatively asymptomatic from the pulm perspective and not a candidate for steroid therapy given his other medical issues (HBP & IDDM); doubt renal dis from sarcoid in light of his other more common etiologies and he has categorically refused biopsies; he is progressing toward end-stage renal dis & need for dialysis;  He needs to work diligently toward optimal BP & DM control- continue management from [place] DrUpton; I would like him to maintain pulm follow up as well- as I will be retiring in Jan2020 I will arrange for pulm f/u with one of my young partners in about 53mo59mooner if needed for any breathing issues..   Plan:     Patient's Medications  New Prescriptions   METOPROLOL TARTRATE (LOPRESSOR) 25 MG TABLET    Take 1 tablet (25 mg total) by mouth 2 (two) times daily.  Previous Medications   ALBUTEROL SULFATE (PROAIR RESPICLICK) 108 127 BASE) MCG/ACT AEPB    Inhale 2 puffs into the lungs every 6 (six) hours as needed.   AMLODIPINE (NORVASC) 10 MG TABLET    Take 1 tablet (10 mg total) by mouth daily.   AMOXICILLIN-CLAVULANATE (AUGMENTIN) 875-125 MG TABLET    Take 1 tablet by mouth 2 (two) times daily.   ASPIRIN EC 81 MG TABLET    Take 81 mg by mouth daily.   BD PEN NEEDLE NANO U/F 32G X 4 MM MISC    use once daily as directed   CLONIDINE (CATAPRES - DOSED IN MG/24 HR) 0.3 MG/24HR PATCH    Place 1 patch (0.3 mg total) onto the skin once a week.   CONTINUOUS BLOOD GLUC RECEIVER (FREESTYLE LIBRE  14 DAY READER) DEVI    USE AS DIRECTED   FLUTICASONE FUROATE-VILANTEROL (BREO ELLIPTA) 100-25 MCG/INH AEPB    Inhale 1 puff into the lungs daily.   FUROSEMIDE (LASIX) 40 MG TABLET    Take 1 tablet (40 mg total) by mouth daily.   GLUCOSE BLOOD (ONETOUCH VERIO) TEST STRIP    1 each by Other route 2 (two) times daily as needed for other. And lancets 2/day   INSULIN GLARGINE (LANTUS SOLOSTAR) 100 UNIT/ML SOLOSTAR PEN    Inject 60 Units into the skin every morning. And pen needles 1/day   LOSARTAN (COZAAR) 100 MG TABLET    Take 1 tablet (100 mg total) by mouth daily.   ROSUVASTATIN (CRESTOR) 20 MG TABLET    Take 1 tablet (20 mg total) daily by mouth.   SPACER/AERO-HOLDING CHAMBERS (AEROCHAMBER MV) INHALER    Use as instructed  Modified Medications   No medications on file  Discontinued Medications   CARVEDILOL (COREG) 25 MG TABLET    Take 1 tablet (25 mg total) 2 (two) times daily by mouth.   CONTINUOUS BLOOD GLUC SENSOR (FREESTYLE LIBRE  14 DAY SENSOR) MISC    1 Device by Does not apply route every 14 (fourteen) days.   TRAMADOL (ULTRAM) 50 MG TABLET    Take 1 tablet (50 mg total) by mouth every 6 (six) hours as needed.

## 2018-01-28 ENCOUNTER — Ambulatory Visit (INDEPENDENT_AMBULATORY_CARE_PROVIDER_SITE_OTHER): Payer: Managed Care, Other (non HMO) | Admitting: Internal Medicine

## 2018-01-28 ENCOUNTER — Encounter: Payer: Self-pay | Admitting: Internal Medicine

## 2018-01-28 ENCOUNTER — Other Ambulatory Visit (INDEPENDENT_AMBULATORY_CARE_PROVIDER_SITE_OTHER): Payer: Managed Care, Other (non HMO)

## 2018-01-28 VITALS — BP 170/92 | HR 67 | Temp 98.3°F | Ht 70.0 in | Wt 195.0 lb

## 2018-01-28 DIAGNOSIS — K529 Noninfective gastroenteritis and colitis, unspecified: Secondary | ICD-10-CM

## 2018-01-28 DIAGNOSIS — R110 Nausea: Secondary | ICD-10-CM

## 2018-01-28 LAB — RENAL FUNCTION PANEL
Albumin: 3.1 g/dL — ABNORMAL LOW (ref 3.5–5.2)
BUN: 26 mg/dL — AB (ref 6–23)
CO2: 21 meq/L (ref 19–32)
CREATININE: 3.48 mg/dL — AB (ref 0.40–1.50)
Calcium: 8.5 mg/dL (ref 8.4–10.5)
Chloride: 106 mEq/L (ref 96–112)
GFR: 23.69 mL/min — ABNORMAL LOW (ref >60.00)
GLUCOSE: 196 mg/dL — AB (ref 70–99)
PHOSPHORUS: 3.6 mg/dL (ref 2.3–4.6)
Potassium: 4.2 mEq/L (ref 3.5–5.1)
SODIUM: 136 meq/L (ref 135–145)

## 2018-01-28 MED ORDER — SILDENAFIL CITRATE 20 MG PO TABS: 20.0000 mg | ORAL_TABLET | Freq: Three times a day (TID) | ORAL | 0 refills | Status: DC

## 2018-01-28 MED ORDER — ONDANSETRON HCL 4 MG PO TABS: 4.0000 mg | ORAL_TABLET | Freq: Three times a day (TID) | ORAL | 0 refills | Status: DC | PRN

## 2018-01-28 MED ORDER — ONDANSETRON HCL 4 MG/2ML IJ SOLN
4.0000 mg | Freq: Once | INTRAMUSCULAR | Status: AC
  Administered 2018-01-28: 4 mg via INTRAMUSCULAR

## 2018-01-28 NOTE — Assessment & Plan Note (Signed)
Given zofran IM at the visit and rx for zofran sent in. Okay to use imodium if needed. Ordered stat renal function as it is concerning that he is not eating or drinking much in 2-3 days. Likely viral in etiology and advised to push fluids. If kidney function abnormal from usual (CKD stage 4) then will need ER or urgent care for fluids.

## 2018-01-28 NOTE — Progress Notes (Signed)
   Subjective:    Patient ID: Brandon Carroll, male    DOB: 1960/12/01, 57 y.o.   MRN: [id number]  HPI The patient is a 57 YO man coming in for possible GI illness. Started on Sunday afternoon with vomiting. Then had some diarrhea starting Monday and today. He has had diarrhea 2-3 times per day since that time. Vomiting up most anything he is eating. He is trying to drink some fluids but not much. Not much food intake in the last 2 days. 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 Denies fevers or chills. Some cramping pain in his stomach with vomiting and some afterwards. Denies any blood in diarrhea. Overall is not improving.   Review of Systems  Constitutional: Positive for activity change, appetite change and fatigue.  HENT: Negative.   Eyes: Negative.   Respiratory: Negative for cough, chest tightness and shortness of breath.   Cardiovascular: Negative for chest pain, palpitations and leg swelling.  Gastrointestinal: Positive for abdominal pain, diarrhea, nausea and vomiting. Negative for abdominal distention, anal bleeding, blood in stool and constipation.  Musculoskeletal: Negative.   Skin: Negative.   Neurological: Negative.   Psychiatric/Behavioral: Negative.       Objective:   Physical Exam  Constitutional: He is oriented to person, place, and time. He appears well-developed and well-nourished.  Appears ill during visit  HENT:  Head: Normocephalic and atraumatic.  Eyes: EOM are normal.  Neck: Normal range of motion.  Cardiovascular: Normal rate and regular rhythm.  Pulmonary/Chest: Effort normal and breath sounds normal. No respiratory distress. He has no wheezes. He has no rales.  Abdominal: Soft. Bowel sounds are normal. He exhibits no distension and no mass. There is no tenderness. There is no rebound and no guarding. No hernia.  Musculoskeletal: He exhibits no edema.  Neurological: He is alert and oriented to person, place, and time. Coordination  normal.  Skin: Skin is warm and dry.  Psychiatric: He has a normal mood and affect.   Vitals:   01/28/18 1526  BP: (!) 170/92  Pulse: 67  Temp: 98.3 F (36.8 C)  TempSrc: Oral  SpO2: 97%  Weight: 195 lb (88.5 kg)  Height: 5\' 10"  (1.778 m)      Assessment & Plan:  Zofran 4 mg IM given at visit

## 2018-01-28 NOTE — Patient Instructions (Signed)
We will give you the nausea medicine here and have sent in a prescription for zofran to use for nausea.   It is okay to take imodium over the counter for diarrhea.  We are checking the labs for the kidney function today as when you get dehydrated the kidneys can be affected.   Make sure to drink plenty of fluids to help.  Food Choices to Help Relieve Diarrhea, Adult When you have diarrhea, the foods you eat and your eating habits are very important. Choosing the right foods and drinks can help:  Relieve diarrhea.  Replace lost fluids and nutrients.  Prevent dehydration.  What general guidelines should I follow? Relieving diarrhea  Choose foods with less than 2 g or .07 oz. of fiber per serving.  Limit fats to less than 8 tsp (38 g or 1.34 oz.) a day.  Avoid the following: ? Foods and beverages sweetened with high-fructose corn syrup, honey, or sugar alcohols such as xylitol, sorbitol, and mannitol. ? Foods that contain a lot of fat or sugar. ? Fried, greasy, or spicy foods. ? High-fiber grains, breads, and cereals. ? Raw fruits and vegetables.  Eat foods that are rich in probiotics. These foods include dairy products such as yogurt and fermented milk products. They help increase healthy bacteria in the stomach and intestines (gastrointestinal tract, or GI tract).  If you have lactose intolerance, avoid dairy products. These may make your diarrhea worse.  Take medicine to help stop diarrhea (antidiarrheal medicine) only as told by your health care provider. Replacing nutrients  Eat small meals or snacks every 3-4 hours.  Eat bland foods, such as white rice, toast, or baked potato, until your diarrhea starts to get better. Gradually reintroduce nutrient-rich foods as tolerated or as told by your health care provider. This includes: ? Well-cooked protein foods. ? Peeled, seeded, and soft-cooked fruits and vegetables. ? Low-fat dairy products.  Take vitamin and mineral  supplements as told by your health care provider. Preventing dehydration   Start by sipping water or a special solution to prevent dehydration (oral rehydration solution, ORS). Urine that is clear or pale yellow means that you are getting enough fluid.  Try to drink at least 8-10 cups of fluid each day to help replace lost fluids.  You may add other liquids in addition to water, such as clear juice or decaffeinated sports drinks, as tolerated or as told by your health care provider.  Avoid drinks with caffeine, such as coffee, tea, or soft drinks.  Avoid alcohol. What foods are recommended? The items listed may not be a complete list. Talk with your health care provider about what dietary choices are best for you. Grains White rice. White, [place], or pita breads (fresh or toasted), including plain rolls, buns, or bagels. White pasta. Saltine, soda, or graham crackers. Pretzels. Low-fiber cereal. Cooked cereals made with water (such as cornmeal, farina, or cream cereals). Plain muffins. Matzo. Melba toast. Zwieback. Vegetables Potatoes (without the skin). Most well-cooked and canned vegetables without skins or seeds. Tender lettuce. Fruits Apple sauce. Fruits canned in juice. Cooked apricots, cherries, grapefruit, peaches, pears, or plums. Fresh bananas and cantaloupe. Meats and other protein foods Baked or boiled chicken. Eggs. Tofu. Fish. Seafood. Smooth nut butters. Ground or well-cooked tender beef, ham, veal, lamb, pork, or poultry. Dairy Plain yogurt, kefir, and unsweetened liquid yogurt. Lactose-free milk, buttermilk, skim milk, or soy milk. Low-fat or nonfat hard cheese. Beverages Water. Low-calorie sports drinks. Fruit juices without pulp. Strained tomato and  vegetable juices. Decaffeinated teas. Sugar-free beverages not sweetened with sugar alcohols. Oral rehydration solutions, if approved by your health care provider. Seasoning and other foods Bouillon, broth, or soups made from  recommended foods. What foods are not recommended? The items listed may not be a complete list. Talk with your health care provider about what dietary choices are best for you. Grains Whole grain, whole wheat, bran, or rye breads, rolls, pastas, and crackers. Wild or brown rice. Whole grain or bran cereals. Barley. Oats and oatmeal. Corn tortillas or taco shells. Granola. Popcorn. Vegetables Raw vegetables. Fried vegetables. Cabbage, broccoli, Brussels sprouts, artichokes, baked beans, beet greens, corn, kale, legumes, peas, sweet potatoes, and yams. Potato skins. Cooked spinach and cabbage. Fruits Dried fruit, including raisins and dates. Raw fruits. Stewed or dried prunes. Canned fruits with syrup. Meat and other protein foods Fried or fatty meats. Deli meats. Chunky nut butters. Nuts and seeds. Beans and lentils. Berniece Salines. Hot dogs. Sausage. Dairy High-fat cheeses. Whole milk, chocolate milk, and beverages made with milk, such as milk shakes. Half-and-half. Cream. sour cream. Ice cream. Beverages Caffeinated beverages (such as coffee, tea, soda, or energy drinks). Alcoholic beverages. Fruit juices with pulp. Prune juice. Soft drinks sweetened with high-fructose corn syrup or sugar alcohols. High-calorie sports drinks. Fats and oils Butter. Cream sauces. Margarine. Salad oils. Plain salad dressings. Olives. Avocados. Mayonnaise. Sweets and desserts Sweet rolls, doughnuts, and sweet breads. Sugar-free desserts sweetened with sugar alcohols such as xylitol and sorbitol. Seasoning and other foods Honey. Hot sauce. Chili powder. Gravy. Cream-based or milk-based soups. Pancakes and waffles. Summary  When you have diarrhea, the foods you eat and your eating habits are very important.  Make sure you get at least 8-10 cups of fluid each day, or enough to keep your urine clear or pale yellow.  Eat bland foods and gradually reintroduce healthy, nutrient-rich foods as tolerated, or as told by your  health care provider.  Avoid high-fiber, fried, greasy, or spicy foods. This information is not intended to replace advice given to you by your health care provider. Make sure you discuss any questions you have with your health care provider. Document Released: 07/21/2003 Document Revised: 04/27/2016 Document Reviewed: 04/27/2016 Elsevier Interactive Patient Education  [employer].

## 2018-01-31 ENCOUNTER — Ambulatory Visit (INDEPENDENT_AMBULATORY_CARE_PROVIDER_SITE_OTHER)
Admission: RE | Admit: 2018-01-31 | Discharge: 2018-01-31 | Disposition: A | Discharge status: Home / Self Care | Payer: Managed Care, Other (non HMO) | Source: Ambulatory Visit | Attending: Family | Admitting: Family

## 2018-01-31 ENCOUNTER — Encounter: Payer: Self-pay | Admitting: Family

## 2018-01-31 ENCOUNTER — Ambulatory Visit (INDEPENDENT_AMBULATORY_CARE_PROVIDER_SITE_OTHER): Payer: Managed Care, Other (non HMO) | Admitting: Family

## 2018-01-31 VITALS — BP 170/90 | HR 72 | Temp 98.6°F | Ht 70.0 in | Wt 196.1 lb

## 2018-01-31 DIAGNOSIS — J683 Other acute and subacute respiratory conditions due to chemicals, gases, fumes and vapors: Secondary | ICD-10-CM | POA: Diagnosis not present

## 2018-01-31 DIAGNOSIS — R0602 Shortness of breath: Secondary | ICD-10-CM

## 2018-01-31 MED ORDER — PREDNISONE 20 MG PO TABS: 20.0000 mg | ORAL_TABLET | Freq: Every day | ORAL | 0 refills | Status: DC

## 2018-01-31 MED ORDER — ALBUTEROL SULFATE 108 (90 BASE) MCG/ACT IN AEPB: 2.0000 | INHALATION_SPRAY | Freq: Four times a day (QID) | RESPIRATORY_TRACT | 1 refills | Status: DC | PRN

## 2018-01-31 MED ORDER — DOXYCYCLINE HYCLATE 100 MG PO TABS: 100.0000 mg | ORAL_TABLET | Freq: Two times a day (BID) | ORAL | 0 refills | Status: DC

## 2018-01-31 MED ORDER — ALBUTEROL SULFATE (2.5 MG/3ML) 0.083% IN NEBU
2.5000 mg | INHALATION_SOLUTION | Freq: Once | RESPIRATORY_TRACT | Status: AC
  Administered 2018-01-31: 2.5 mg via RESPIRATORY_TRACT

## 2018-01-31 MED ORDER — FLUTICASONE FUROATE-VILANTEROL 100-25 MCG/INH IN AEPB: 1.0000 | INHALATION_SPRAY | Freq: Every day | RESPIRATORY_TRACT | 1 refills | Status: DC

## 2018-01-31 NOTE — Progress Notes (Signed)
Brandon Carroll is a 57 y.o. male with the following history as recorded in Epic[facility]:  Patient Active Problem List   Diagnosis Date Noted  . Acute gastroenteritis 01/28/2018  . Essential hypertension 07/03/2016  . Sarcoidosis 07/02/2016  . Low testosterone 10/04/2015  . Erectile dysfunction 06/21/2015  . CKD (chronic kidney disease) stage 4, GFR 15-29 ml/min (HCC) 06/15/2015  . Chronic systolic CHF (congestive heart failure) ([place]) 06/15/2015  . Abnormal CT of the chest 06/08/2015  . 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 Anemia 06/06/2015  . Diabetes mellitus with renal complications ([place]) 85/46/2703  . Hyperlipidemia associated with type 2 diabetes mellitus ([place]) 09/18/2011    Current Outpatient Medications  Medication Sig Dispense Refill  . Albuterol Sulfate (PROAIR RESPICLICK) 500 (90 Base) MCG/ACT AEPB Inhale 2 puffs into the lungs every 6 (six) hours as needed. 1 each 1  . amLODipine (NORVASC) 10 MG tablet Take 1 tablet (10 mg total) by mouth daily. 90 tablet 3  . aspirin EC 81 MG tablet Take 81 mg by mouth daily.    . BD PEN NEEDLE NANO U/F 32G X 4 MM MISC use once daily as directed 100 each PRN  . cloNIDine (CATAPRES - DOSED IN MG/24 HR) 0.3 mg/24hr patch Place 1 patch (0.3 mg total) onto the skin once a week. 4 patch 12  . Continuous Blood Gluc Receiver (FREESTYLE LIBRE 14 DAY READER) DEVI USE AS DIRECTED 6 Device 3  . fluticasone furoate-vilanterol (BREO ELLIPTA) 100-25 MCG/INH AEPB Inhale 1 puff into the lungs daily. 1 each 1  . furosemide (LASIX) 40 MG tablet Take 1 tablet (40 mg total) by mouth daily. 90 tablet 3  . glucose blood (ONETOUCH VERIO) test strip 1 each by Other route 2 (two) times daily as needed for other. And lancets 2/day 100 each 12  . Insulin Glargine (LANTUS SOLOSTAR) 100 UNIT/ML Solostar Pen Inject 60 Units into the skin every morning. And pen needles 1/day 10 pen PRN  . losartan (COZAAR) 100 MG tablet Take 1 tablet (100 mg total) by mouth daily.  90 tablet 3  . metoprolol tartrate (LOPRESSOR) 25 MG tablet Take 1 tablet (25 mg total) by mouth 2 (two) times daily. 60 tablet 5  . ondansetron (ZOFRAN) 4 MG tablet Take 1 tablet (4 mg total) by mouth every 8 (eight) hours as needed for nausea or vomiting. 20 tablet 0  . rosuvastatin (CRESTOR) 20 MG tablet Take 1 tablet (20 mg total) daily by mouth. 90 tablet 3  . sildenafil (REVATIO) 20 MG tablet Take 1-5 tablets (20-100 mg total) by mouth 3 (three) times daily. 60 tablet 0  . Spacer/Aero-Holding Chambers (AEROCHAMBER MV) inhaler Use as instructed 1 each 0  . doxycycline (VIBRA-TABS) 100 MG tablet Take 1 tablet (100 mg total) by mouth 2 (two) times daily. 20 tablet 0  . predniSONE (DELTASONE) 20 MG tablet Take 1 tablet (20 mg total) by mouth daily with breakfast. 5 tablet 0   No current facility-administered medications for this visit.     Allergies: Lipitor [atorvastatin]  Past Medical History:  Diagnosis Date  . Acute systolic CHF (congestive heart failure), NYHA class 2 ([place]) 05/04/2015  . Acute-on-chronic kidney injury ([place]) 05/04/2015  . Diabetes mellitus   . Erectile dysfunction   . Hyperlipidemia   . Hypertension     Past Surgical History:  Procedure Laterality Date  . CARDIAC CATHETERIZATION N/A 06/15/2015   Procedure: Left Heart Cath and Coronary Angiography;  Surgeon: Peter M Martinique, MD;  Location: [facility];  Service: Cardiovascular;  Laterality: N/A;  . CHOLECYSTECTOMY N/A 06/28/2017   Procedure: LAPAROSCOPIC CHOLECYSTECTOMY;  Surgeon: Ralene Ok, MD;  Location: [facility];  Service: General;  Laterality: N/A;  . HERNIA REPAIR  7th grade   umbilical    Family History  Problem Relation Age of Onset  . Arthritis Mother   . COPD Mother   . Diabetes Mother        was thin, took insulin  . Hypertension Mother   . Hypertension Father   . Arthritis Sister        rheumatoid  . COPD Sister   . Diabetes Brother   . Colon cancer Neg Hx     Social History    Tobacco Use  . Smoking status: Never Smoker  . Smokeless tobacco: Never Used  Substance Use Topics  . Alcohol use: No    Alcohol/week: 0.0 standard drinks    Subjective:  Patient presents with cold symptoms that started last Sunday; progressively worsening; feeling increased sense of shortness of breath/ wheezing in the past 24 hours; + sarcoid but not taking medications as prescribed; does not have active prescriptions for BREO/ albuterol; no chest pain on exertion; no wheezing in lower extremities;   Objective:  Vitals:   01/31/18 1105 01/31/18 1107  BP: (!) 172/90 (!) 170/90  Pulse: 72   Temp: 98.6 F (37 C)   TempSrc: Oral   SpO2: 91% 94%  Weight: 196 lb 1.3 oz (88.9 kg)   Height: 5\' 10"  (1.778 m)     General: Well developed, well nourished, in no acute distress  Skin : Warm and dry.  Head: Normocephalic and atraumatic  Eyes: Sclera and conjunctiva clear; pupils round and reactive to light; extraocular movements intact  Ears: External normal; canals clear; tympanic membranes normal  Oropharynx: Pink, supple. No suspicious lesions  Neck: Supple without thyromegaly, adenopathy  Lungs: Respirations unlabored; decreased breath sounds on initial exam; improved after breathing treatment; CVS exam: normal rate and regular rhythm.  Neurologic: Alert and oriented; speech intact; face symmetrical; moves all extremities well; CNII-XII intact without focal deficit   Assessment:  1. Shortness of breath   2. Reactive airways dysfunction syndrome, mild intermittent, uncomplicated (HCC)     Plan:  ? Pneumonia; update CXR today; sample of BREO given and stressed need to take daily as prescribed; refill also given on albuterol inhaler; Rx for Doxycycline 100 mg bid x 10 days, Prednisone 20 mg qd x 5 days; increase fluids, rest; follow-up to be determined.   No follow-ups on file.  Orders Placed This Encounter  Procedures  . DG Chest 2 View    Standing Status:   Future    Number of  Occurrences:   1    Standing Expiration Date:   04/03/2019    Order Specific Question:   Reason for Exam (SYMPTOM  OR DIAGNOSIS REQUIRED)    Answer:   cough/ shortn of breath    Order Specific Question:   Preferred imaging location?    Answer:   [facility]    Order Specific Question:   Radiology Contrast Protocol - do NOT remove file path    Answer:   [email or web address]    Requested Prescriptions   Signed Prescriptions Disp Refills  . Albuterol Sulfate (PROAIR RESPICLICK) 676 (90 Base) MCG/ACT AEPB 1 each 1    Sig: Inhale 2 puffs into the lungs every 6 (six) hours as needed.  . fluticasone furoate-vilanterol (BREO ELLIPTA) 100-25 MCG/INH AEPB 1 each  1    Sig: Inhale 1 puff into the lungs daily.  Marland Kitchen doxycycline (VIBRA-TABS) 100 MG tablet 20 tablet 0    Sig: Take 1 tablet (100 mg total) by mouth 2 (two) times daily.  . predniSONE (DELTASONE) 20 MG tablet 5 tablet 0    Sig: Take 1 tablet (20 mg total) by mouth daily with breakfast.

## 2018-02-07 ENCOUNTER — Ambulatory Visit (INDEPENDENT_AMBULATORY_CARE_PROVIDER_SITE_OTHER)
Admission: RE | Admit: 2018-02-07 | Discharge: 2018-02-07 | Disposition: A | Discharge status: Home / Self Care | Payer: Managed Care, Other (non HMO) | Source: Ambulatory Visit | Attending: Nurse Practitioner | Admitting: Nurse Practitioner

## 2018-02-07 ENCOUNTER — Ambulatory Visit (INDEPENDENT_AMBULATORY_CARE_PROVIDER_SITE_OTHER): Payer: Managed Care, Other (non HMO) | Admitting: Nurse Practitioner

## 2018-02-07 ENCOUNTER — Ambulatory Visit: Payer: Self-pay | Admitting: *Deleted

## 2018-02-07 ENCOUNTER — Other Ambulatory Visit (INDEPENDENT_AMBULATORY_CARE_PROVIDER_SITE_OTHER): Payer: Managed Care, Other (non HMO)

## 2018-02-07 ENCOUNTER — Encounter: Payer: Self-pay | Admitting: Nurse Practitioner

## 2018-02-07 VITALS — BP 168/84 | HR 83 | Ht 70.0 in | Wt 206.0 lb

## 2018-02-07 DIAGNOSIS — I10 Essential (primary) hypertension: Secondary | ICD-10-CM

## 2018-02-07 DIAGNOSIS — R05 Cough: Secondary | ICD-10-CM

## 2018-02-07 DIAGNOSIS — R609 Edema, unspecified: Secondary | ICD-10-CM | POA: Diagnosis not present

## 2018-02-07 DIAGNOSIS — R059 Cough, unspecified: Secondary | ICD-10-CM

## 2018-02-07 LAB — RENAL FUNCTION PANEL
Albumin: 2.8 g/dL — ABNORMAL LOW (ref 3.5–5.2)
BUN: 44 mg/dL — AB (ref 6–23)
CALCIUM: 8 mg/dL — AB (ref 8.4–10.5)
CO2: 22 meq/L (ref 19–32)
CREATININE: 3.66 mg/dL — AB (ref 0.40–1.50)
Chloride: 108 mEq/L (ref 96–112)
GFR: 22.34 mL/min — ABNORMAL LOW (ref >60.00)
GLUCOSE: 95 mg/dL (ref 70–99)
Phosphorus: 4.7 mg/dL — ABNORMAL HIGH (ref 2.3–4.6)
Potassium: 4.5 mEq/L (ref 3.5–5.1)
Sodium: 136 mEq/L (ref 135–145)

## 2018-02-07 LAB — BRAIN NATRIURETIC PEPTIDE: PRO B NATRI PEPTIDE: 478 pg/mL — AB (ref 0.0–100.0)

## 2018-02-07 NOTE — Telephone Encounter (Signed)
Pt called with complaints of swelling of both legs that started yesterday; pt says he was seen in office 01/31/18; recommendations made per nurse triage to include seeing a physician within 24 hours; he normally sees Brandon Carroll but she has no availability within parameters per protocol; pt offered and accepted appointment with Brandon Carroll, [place] 02/07/18 at 1530; he verbalizes understanding; will route to office for notification of this upcoming appointment.   Reason for Disposition . [1] MODERATE leg swelling (e.g., swelling extends up to knees) AND [2] new onset or worsening  Answer Assessment - Initial Assessment Questions 1. ONSET: "When did the swelling start?" (e.g., minutes, hours, days)     02/06/18 2. LOCATION: "What part of the leg is swollen?"  "Are both legs swollen or just one leg?"     Both legs feet to kness 3. SEVERITY: "How bad is the swelling?" (e.g., localized; mild, moderate, severe)  - Localized - small area of swelling localized to one leg  - MILD pedal edema - swelling limited to foot and ankle, pitting edema < 1/4 inch (6 mm) deep, rest and elevation eliminate most or all swelling  - MODERATE edema - swelling of lower leg to knee, pitting edema > 1/4 inch (6 mm) deep, rest and elevation only partially reduce swelling  - SEVERE edema - swelling extends above knee, facial or hand swelling present      moderate 4. REDNESS: "Does the swelling look red or infected?"     no 5. PAIN: "Is the swelling painful to touch?" If so, ask: "How painful is it?"   (Scale 1-10; mild, moderate or severe)     no 6. FEVER: "Do you have a fever?" If so, ask: "What is it, how was it measured, and when did it start?"      no 7. CAUSE: "What do you think is causing the leg swelling?"     Not sure 8. MEDICAL HISTORY: "Do you have a history of heart failure, kidney disease, liver failure, or cancer?"   High blood pressure   9. RECURRENT SYMPTOM: "Have you had leg swelling before?" If so,  ask: "When was the last time?" "What happened that time?"     no 10. OTHER SYMPTOMS: "Do you have any other symptoms?" (e.g., chest pain, difficulty breathing)       no 11. PREGNANCY: "Is there any chance you are pregnant?" "When was your last menstrual period?"       n/a  Protocols used: LEG SWELLING AND EDEMA-A-AH

## 2018-02-07 NOTE — Patient Instructions (Addendum)
Please head downstairs for lab work/x-rays. I will follow up with further plans when I get your tests back.   Edema Edema is when you have too much fluid in your body or under your skin. Edema may make your legs, feet, and ankles swell up. Swelling is also common in looser tissues, like around your eyes. This is a common condition. It gets more common as you get older. There are many possible causes of edema. Eating too much salt (sodium) and being on your feet or sitting for a long time can cause edema in your legs, feet, and ankles. Hot weather may make edema worse. Edema is usually painless. Your skin may look swollen or shiny. Follow these instructions at home:  Keep the swollen body part raised (elevated) above the level of your heart when you are sitting or lying down.  Do not sit still or stand for a long time.  Do not wear tight clothes. Do not wear garters on your upper legs.  Exercise your legs. This can help the swelling go down.  Wear elastic bandages or support stockings as told by your doctor.  Eat a low-salt (low-sodium) diet to reduce fluid as told by your doctor.  Depending on the cause of your swelling, you may need to limit how much fluid you drink (fluid restriction).  Take over-the-counter and prescription medicines only as told by your doctor. Contact a doctor if:  Treatment is not working.  You have heart, liver, or kidney disease and have symptoms of edema.  You have sudden and unexplained weight gain. Get help right away if:  You have shortness of breath or chest pain.  You cannot breathe when you lie down.  You have pain, redness, or warmth in the swollen areas.  You have heart, liver, or kidney disease and get edema all of a sudden.  You have a fever and your symptoms get worse all of a sudden. Summary  Edema is when you have too much fluid in your body or under your skin.  Edema may make your legs, feet, and ankles swell up. Swelling is also  common in looser tissues, like around your eyes.  Raise (elevate) the swollen body part above the level of your heart when you are sitting or lying down.  Follow your doctor's instructions about diet and how much fluid you can drink (fluid restriction). This information is not intended to replace advice given to you by your health care provider. Make sure you discuss any questions you have with your health care provider. Document Released: 10/17/2007 Document Revised: 05/18/2016 Document Reviewed: 05/18/2016 Elsevier Interactive Patient Education  2017 [employer].

## 2018-02-07 NOTE — Progress Notes (Signed)
Name: Brandon Carroll   MRN: [id number]    DOB: 02-20-1961   Date:02/07/2018       Progress Note  Subjective  Chief Complaint  Chief Complaint  Patient presents with  . Foot Swelling    Bilateral LE x 2 days    HPI Brandon Carroll is here today for BLE edema, first noticed about 2 days ago after completing course of prednisone for URI, is also still on antibiotics for same URI/pneumonia. He reports continued cough, but says he always coughs due to his sarcoidosis. He denies fevers, chills, chest pain, palpiations, sob, abdominal pain, n/v, leg pain, skin discoloration. He has never noticed swelling in his legs in the past. His blood pressure is elevated today, his metoprolol was stopped and clonidine was restarted about 2 weeks ago due to metoprolol causing headaches, does report daily compliance to all BP meds. He has a history of CHF, CKD, last echo in 2016 showed EF 40-50%   BP Readings from Last 3 Encounters:  02/07/18 (!) 168/84  01/31/18 (!) 170/90  01/28/18 (!) 170/92   Wt Readings from Last 3 Encounters:  02/07/18 206 lb (93.4 kg)  01/31/18 196 lb 1.3 oz (88.9 kg)  01/28/18 195 lb (88.5 kg)     Patient Active Problem List   Diagnosis Date Noted  . Acute gastroenteritis 01/28/2018  . Essential hypertension 07/03/2016  . Sarcoidosis 07/02/2016  . Low testosterone 10/04/2015  . Erectile dysfunction 06/21/2015  . CKD (chronic kidney disease) stage 4, GFR 15-29 ml/min (HCC) 06/15/2015  . Chronic systolic CHF (congestive heart failure) ([place]) 06/15/2015  . Abnormal CT of the chest 06/08/2015  . 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 Anemia 06/06/2015  . Diabetes mellitus with renal complications ([place]) 69/62/9528  . Hyperlipidemia associated with type 2 diabetes mellitus ([place]) 09/18/2011    Social History   Tobacco Use  . Smoking status: Never Smoker  . Smokeless tobacco: Never Used  Substance Use Topics  . Alcohol use: No    Alcohol/week: 0.0 standard  drinks     Current Outpatient Medications:  .  Albuterol Sulfate (PROAIR RESPICLICK) 413 (90 Base) MCG/ACT AEPB, Inhale 2 puffs into the lungs every 6 (six) hours as needed., Disp: 1 each, Rfl: 1 .  amLODipine (NORVASC) 10 MG tablet, Take 1 tablet (10 mg total) by mouth daily., Disp: 90 tablet, Rfl: 3 .  aspirin EC 81 MG tablet, Take 81 mg by mouth daily., Disp: , Rfl:  .  BD PEN NEEDLE NANO U/F 32G X 4 MM MISC, use once daily as directed, Disp: 100 each, Rfl: PRN .  cloNIDine (CATAPRES - DOSED IN MG/24 HR) 0.3 mg/24hr patch, Place 1 patch (0.3 mg total) onto the skin once a week., Disp: 4 patch, Rfl: 12 .  Continuous Blood Gluc Receiver (FREESTYLE LIBRE 14 DAY READER) DEVI, USE AS DIRECTED, Disp: 6 Device, Rfl: 3 .  fluticasone furoate-vilanterol (BREO ELLIPTA) 100-25 MCG/INH AEPB, Inhale 1 puff into the lungs daily., Disp: 1 each, Rfl: 1 .  furosemide (LASIX) 40 MG tablet, Take 1 tablet (40 mg total) by mouth daily., Disp: 90 tablet, Rfl: 3 .  glucose blood (ONETOUCH VERIO) test strip, 1 each by Other route 2 (two) times daily as needed for other. And lancets 2/day, Disp: 100 each, Rfl: 12 .  Insulin Glargine (LANTUS SOLOSTAR) 100 UNIT/ML Solostar Pen, Inject 60 Units into the skin every morning. And pen needles 1/day, Disp: 10 pen, Rfl: PRN .  losartan (COZAAR) 100 MG tablet, Take 1  tablet (100 mg total) by mouth daily., Disp: 90 tablet, Rfl: 3 .  ondansetron (ZOFRAN) 4 MG tablet, Take 1 tablet (4 mg total) by mouth every 8 (eight) hours as needed for nausea or vomiting., Disp: 20 tablet, Rfl: 0 .  rosuvastatin (CRESTOR) 20 MG tablet, Take 1 tablet (20 mg total) daily by mouth., Disp: 90 tablet, Rfl: 3 .  sildenafil (REVATIO) 20 MG tablet, Take 1-5 tablets (20-100 mg total) by mouth 3 (three) times daily., Disp: 60 tablet, Rfl: 0 .  Spacer/Aero-Holding Chambers (AEROCHAMBER MV) inhaler, Use as instructed, Disp: 1 each, Rfl: 0 .  metoprolol tartrate (LOPRESSOR) 25 MG tablet, Take 1 tablet (25  mg total) by mouth 2 (two) times daily. (Patient not taking: Reported on 02/07/2018), Disp: 60 tablet, Rfl: 5  Allergies  Allergen Reactions  . Lipitor [Atorvastatin] Hives and Itching    ROS   No other specific complaints in a complete review of systems (except as listed in HPI above).  Objective  Vitals:   02/07/18 1517 02/07/18 1556  BP: (!) 170/80 (!) 168/84  Pulse: 83   SpO2: 97%   Weight: 206 lb (93.4 kg)   Height: 5\' 10"  (1.778 m)     Body mass index is 29.56 kg/m.  Nursing Note and Vital Signs reviewed.  Physical Exam  Constitutional: Patient appears well-developed and well-nourished.  No distress.  HEENT: head atraumatic, normocephalic, pupils equal and reactive to light, EOM's intact, neck supple, oropharynx pink and moist without exudate Cardiovascular: Normal rate, regular rhythm, distal pulses intact. 2+ BLE. Pulmonary/Chest: Effort normal and breath sounds clear. No respiratory distress or retractions. Musculoskeletal: Normal range of motion,  No gross deformities Neurological: He is alert and oriented to person, place, and time. No cranial nerve deficit. Coordination, balance, strength, speech and gait are normal.  Skin: Skin is warm and dry. No rash noted. No erythema.  Psychiatric: Patient has a normal mood and affect. behavior is normal. Judgment and thought content normal.   Assessment & Plan F/U with further plans pending labs today  1. Cough Continue antibiotic course Update labs, cxr F/U with further recommendations pending lab results - B Nat Peptide; Future - DG Chest 2 View; Future  2. Edema, unspecified type Could be related to CKD, CHF, HTN, amlodipine Update labs, cxr F/U with further recommendations pending lab results - Renal Function Panel; Future - B Nat Peptide; Future - DG Chest 2 View; Future  3. Essential hypertension BP uncontrolled on current medications We need to adjust his medications but I am wondering if his  amlodipine is contributing to his leg swelling, will follow up with further plan once his labs/cxr return  -home management of Edema, Red flags and when to present for emergency care or RTC including fever >101.62F, chest pain, shortness of breath, new/worsening/un-resolving symptoms,  reviewed with patient at time of visit. Follow up and care instructions discussed and provided in AVS.

## 2018-02-08 ENCOUNTER — Ambulatory Visit: Payer: Managed Care, Other (non HMO) | Admitting: Family Medicine

## 2018-02-10 ENCOUNTER — Other Ambulatory Visit: Payer: Self-pay | Admitting: Nurse Practitioner

## 2018-02-10 DIAGNOSIS — R7989 Other specified abnormal findings of blood chemistry: Secondary | ICD-10-CM

## 2018-02-10 DIAGNOSIS — R600 Localized edema: Secondary | ICD-10-CM

## 2018-02-17 ENCOUNTER — Ambulatory Visit (INDEPENDENT_AMBULATORY_CARE_PROVIDER_SITE_OTHER): Payer: Managed Care, Other (non HMO) | Admitting: Cardiology

## 2018-02-17 ENCOUNTER — Encounter: Payer: Self-pay | Admitting: Cardiology

## 2018-02-17 VITALS — BP 170/82 | HR 85 | Ht 70.0 in | Wt 190.8 lb

## 2018-02-17 DIAGNOSIS — D869 Sarcoidosis, unspecified: Secondary | ICD-10-CM | POA: Diagnosis not present

## 2018-02-17 DIAGNOSIS — Z794 Long term (current) use of insulin: Secondary | ICD-10-CM

## 2018-02-17 DIAGNOSIS — I2510 Atherosclerotic heart disease of native coronary artery without angina pectoris: Secondary | ICD-10-CM

## 2018-02-17 DIAGNOSIS — I5022 Chronic systolic (congestive) heart failure: Secondary | ICD-10-CM

## 2018-02-17 DIAGNOSIS — E785 Hyperlipidemia, unspecified: Secondary | ICD-10-CM

## 2018-02-17 DIAGNOSIS — I10 Essential (primary) hypertension: Secondary | ICD-10-CM

## 2018-02-17 DIAGNOSIS — E0821 Diabetes mellitus due to underlying condition with diabetic nephropathy: Secondary | ICD-10-CM | POA: Diagnosis not present

## 2018-02-17 HISTORY — DX: Atherosclerotic heart disease of native coronary artery without angina pectoris: I25.10

## 2018-02-17 MED ORDER — HYDRALAZINE HCL 10 MG PO TABS: 10.0000 mg | ORAL_TABLET | Freq: Three times a day (TID) | ORAL | 3 refills | Status: DC

## 2018-02-17 MED ORDER — ROSUVASTATIN CALCIUM 20 MG PO TABS: 40.0000 mg | ORAL_TABLET | Freq: Every day | ORAL | 3 refills | Status: DC

## 2018-02-17 NOTE — Patient Instructions (Signed)
Medication Instructions:  Your physician has recommended you make the following change in your medication:  START: Hydralazine 10 mg 3 times daily  INCREASE: Crestor to 40 mg daily   If you need a refill on your cardiac medications before your next appointment, please call your pharmacy.   Lab work: None.   If you have labs (blood work) drawn today and your tests are completely normal, you will receive your results only by: Marland Kitchen MyChart Message (if you have MyChart) OR . A paper copy in the mail If you have any lab test that is abnormal or we need to change your treatment, we will call you to review the results.  Testing/Procedures: Your physician has requested that you have an echocardiogram. Echocardiography is a painless test that uses sound waves to create images of your heart. It provides your doctor with information about the size and shape of your heart and how well your heart's chambers and valves are working. This procedure takes approximately one hour. There are no restrictions for this procedure.    Follow-Up: At [facility], you and your health needs are our priority.  As part of our continuing mission to provide you with exceptional heart care, we have created designated Provider Care Teams.  These Care Teams include your primary Cardiologist (physician) and Advanced Practice Providers (APPs -  Physician Assistants and Nurse Practitioners) who all work together to provide you with the care you need, when you need it. You will need a follow up appointment in 1 months.  Please call our office 2 months in advance to schedule this appointment.  You may see No primary care provider on file. or another member of our [employer] Provider Team in [place]: Shirlee More, MD . Jyl Heinz, MD .   Any Other Special Instructions Will Be Listed Below (If Applicable).   Hydralazine tablets What is this medicine? HYDRALAZINE (hye DRAL a zeen) is a type of vasodilator. It  relaxes blood vessels, increasing the blood and oxygen supply to your heart. This medicine is used to treat high blood pressure. This medicine may be used for other purposes; ask your health care provider or pharmacist if you have questions. COMMON BRAND NAME(S): Apresoline What should I tell my health care provider before I take this medicine? They need to know if you have any of these conditions: -blood vessel disease -heart disease including angina or history of heart attack -kidney or liver disease -systemic lupus erythematosus (SLE) -an unusual or allergic reaction to hydralazine, tartrazine dye, other medicines, foods, dyes, or preservatives -pregnant or trying to get pregnant -breast-feeding How should I use this medicine? Take this medicine by mouth with a glass of water. Follow the directions on the prescription label. Take your doses at regular intervals. Do not take your medicine more often than directed. Do not stop taking except on the advice of your doctor or health care professional. Talk to your pediatrician regarding the use of this medicine in children. Special care may be needed. While this drug may be prescribed for children for selected conditions, precautions do apply. Overdosage: If you think you have taken too much of this medicine contact a poison control center or emergency room at once. NOTE: This medicine is only for you. Do not share this medicine with others. What if I miss a dose? If you miss a dose, take it as soon as you can. If it is almost time for your next dose, take only that dose. Do not  take double or extra doses. What may interact with this medicine? -medicines for high blood pressure -medicines for mental depression This list may not describe all possible interactions. Give your health care provider a list of all the medicines, herbs, non-prescription drugs, or dietary supplements you use. Also tell them if you smoke, drink alcohol, or use illegal  drugs. Some items may interact with your medicine. What should I watch for while using this medicine? Visit your doctor or health care professional for regular checks on your progress. Check your blood pressure and pulse rate regularly. Ask your doctor or health care professional what your blood pressure and pulse rate should be and when you should contact him or her. You may get drowsy or dizzy. Do not drive, use machinery, or do anything that needs mental alertness until you know how this medicine affects you. Do not stand or sit up quickly, especially if you are an older patient. This reduces the risk of dizzy or fainting spells. Alcohol may interfere with the effect of this medicine. Avoid alcoholic drinks. Do not treat yourself for coughs, colds, or pain while you are taking this medicine without asking your doctor or health care professional for advice. Some ingredients may increase your blood pressure. What side effects may I notice from receiving this medicine? Side effects that you should report to your doctor or health care professional as soon as possible: -chest pain, or fast or irregular heartbeat -fever, chills, or sore throat -numbness or tingling in the hands or feet -shortness of breath -skin rash, redness, blisters or itching -stiff or swollen joints -sudden weight gain -swelling of the feet or legs -swollen lymph glands -unusual weakness Side effects that usually do not require medical attention (report to your doctor or health care professional if they continue or are bothersome): -diarrhea, or constipation -headache -loss of appetite -nausea, vomiting This list may not describe all possible side effects. Call your doctor for medical advice about side effects. You may report side effects to FDA at 1-800-FDA-1088. Where should I keep my medicine? Keep out of the reach of children. Store at room temperature between 15 and 30 degrees C (59 and 86 degrees F). Throw away any  unused medicine after the expiration date. NOTE: This sheet is a summary. It may not cover all possible information. If you have questions about this medicine, talk to your doctor, pharmacist, or health care provider.  2018 Elsevier/Gold Standard (2007-09-12 15:44:58)

## 2018-02-17 NOTE — Progress Notes (Signed)
Cardiology Consultation:    Date:  02/17/2018   ID:  [name], DOB [birth year], MRN [id number]  PCP:  Hoyt Koch, MD  Cardiologist:  Jenne Campus, MD   Referring MD: Lance Sell, NP   Chief Complaint  Patient presents with  . Edema  . Elevated BNP  I have swollen legs  History of Present Illness:    Brandon Carroll is a 57 y.o. male who is being seen today for the evaluation of congestive heart failure at the request of Lance Sell, NP.  He is a complicated patient with long-standing poorly controlled diabetes, hypertension for many years, also diagnosis of sarcoidosis, in 2016 he had echocardiogram done which showed ejection fraction 40 to 45% following year he had cardiac catheterization performed which showed only 10% narrowing of right coronary artery as well as left anterior descending artery.  After that he was lost in follow-up for cardiology.  Recently he started having some shortness of breath.  He was diagnosed with possible pneumonia he was put on doxycycline as well as prednisone.  Shortly after that he started noticing swelling of lower extremities interestingly swelling was worse in the morning and evening time.  Eventually he finished a course of antibiotic as well as steroids and all problems subsided.  He does not have much swelling of lower extremities.  He said his ability to perform activity of daily living is back to baseline.  Described to have some shortness of breath with exertion but no chest pain tightness squeezing pressure burning chest no dizziness no passing out. He does have long-standing diabetes which is poorly controlled slightly better but still some room for improvement.  He always had a high blood pressure since he remember also multiple family members hypertension as well.  Also he was discovered to have significant kidney dysfunction.  He is scheduled to see nephrologist this coming Wednesday.  Which is in 2  days. He never smoked.  He does not drink but does have family history of multiple medical issues which include myocardial infarction at the age before 55 as well as diabetes and complication of it which include kidney failure.  Past Medical History:  Diagnosis Date  . Acute systolic CHF (congestive heart failure), NYHA class 2 ([place]) 05/04/2015  . Acute-on-chronic kidney injury ([place]) 05/04/2015  . Diabetes mellitus   . Erectile dysfunction   . Hyperlipidemia   . Hypertension     Past Surgical History:  Procedure Laterality Date  . CARDIAC CATHETERIZATION N/A 06/15/2015   Procedure: Left Heart Cath and Coronary Angiography;  Surgeon: Peter M Martinique, MD;  Location: [facility];  Service: Cardiovascular;  Laterality: N/A;  . CHOLECYSTECTOMY N/A 06/28/2017   Procedure: LAPAROSCOPIC CHOLECYSTECTOMY;  Surgeon: Ralene Ok, MD;  Location: [facility];  Service: General;  Laterality: N/A;  . HERNIA REPAIR  7th grade   umbilical    Current Medications: Current Meds  Medication Sig  . Albuterol Sulfate (PROAIR RESPICLICK) 409 (90 Base) MCG/ACT AEPB Inhale 2 puffs into the lungs every 6 (six) hours as needed.  Marland Kitchen amLODipine (NORVASC) 10 MG tablet Take 1 tablet (10 mg total) by mouth daily.  Marland Kitchen aspirin EC 81 MG tablet Take 81 mg by mouth daily.  . BD PEN NEEDLE NANO U/F 32G X 4 MM MISC use once daily as directed  . cloNIDine (CATAPRES - DOSED IN MG/24 HR) 0.3 mg/24hr patch Place 1 patch (0.3 mg total) onto the skin once a week.  . Continuous  Blood Gluc Receiver (FREESTYLE LIBRE 14 DAY READER) DEVI USE AS DIRECTED  . fluticasone furoate-vilanterol (BREO ELLIPTA) 100-25 MCG/INH AEPB Inhale 1 puff into the lungs daily.  . furosemide (LASIX) 40 MG tablet Take 1 tablet (40 mg total) by mouth daily.  Marland Kitchen glucose blood (ONETOUCH VERIO) test strip 1 each by Other route 2 (two) times daily as needed for other. And lancets 2/day  . Insulin Glargine (LANTUS SOLOSTAR) 100 UNIT/ML Solostar Pen Inject 60  Units into the skin every morning. And pen needles 1/day  . losartan (COZAAR) 100 MG tablet Take 1 tablet (100 mg total) by mouth daily.  . rosuvastatin (CRESTOR) 20 MG tablet Take 1 tablet (20 mg total) daily by mouth.  . sildenafil (REVATIO) 20 MG tablet Take 1-5 tablets (20-100 mg total) by mouth 3 (three) times daily.  Marland Kitchen Spacer/Aero-Holding Chambers (AEROCHAMBER MV) inhaler Use as instructed     Allergies:   Lipitor [atorvastatin]   Social History   Socioeconomic History  . Marital status: Divorced    Spouse name: n/a  . Number of children: 1  . Years of education: 12+  . Highest education level: Not on file  Occupational History  . Occupation: Building services engineer: [employer]  Social Needs  . Financial resource strain: Not on file  . Food insecurity:    Worry: Not on file    Inability: Not on file  . Transportation needs:    Medical: Not on file    Non-medical: Not on file  Tobacco Use  . Smoking status: Never Smoker  . Smokeless tobacco: Never Used  Substance and Sexual Activity  . Alcohol use: No    Alcohol/week: 0.0 standard drinks  . Drug use: No  . Sexual activity: Not Currently    Comment: Erectile dysfunciotn  Lifestyle  . Physical activity:    Days per week: Not on file    Minutes per session: Not on file  . Stress: Not on file  Relationships  . Social connections:    Talks on phone: Not on file    Gets together: Not on file    Attends religious service: Not on file    Active member of club or organization: Not on file    Attends meetings of clubs or organizations: Not on file    Relationship status: Not on file  Other Topics Concern  . Not on file  Social History Narrative   Divorced. Education: [employer].    Unable to exercise due to significant SOB and DOE.    Lives alone.   Son lives in [place].     Family History: The patient's family history includes Arthritis in his mother and sister; COPD in his mother and sister; Diabetes in his  brother and mother; Hypertension in his father and mother. There is no history of Colon cancer. ROS:   Please see the history of present illness.    All 14 point review of systems negative except as described per history of present illness.  EKGs/Labs/Other Studies Reviewed:    The following studies were reviewed today: Cardiac catheterization reviewed from 2016 showing 10% of RCA as well 10% LA  EKG:  EKG is  ordered today.  The ekg ordered today demonstrates EKG showed normal sinus rhythm normal P interval evidence of left ventricle hypertrophy.  Nonspecific ST segment changes.  Recent Labs: 03/21/2017: ALT 26; TSH 2.400 09/24/2017: Hemoglobin 11.2; Platelets 259.0 02/07/2018: BUN 44; Creatinine, Ser 3.66; Potassium 4.5; Pro B Natriuretic peptide (BNP)  478.0; Sodium 136  Recent Lipid Panel    Component Value Date/Time   CHOL 197 09/24/2017 0823   CHOL 305 (H) 03/21/2017 1124   TRIG 245.0 (H) 09/24/2017 0823   HDL 45.60 09/24/2017 0823   HDL 60 03/21/2017 1124   CHOLHDL 4 09/24/2017 0823   VLDL 49.0 (H) 09/24/2017 0823   LDLCALC 212 (H) 03/21/2017 1124   LDLDIRECT 106.0 09/24/2017 0823    Physical Exam:    VS:  BP (!) 170/82   Pulse 85   Ht 5\' 10"  (1.778 m)   Wt 190 lb 12.8 oz (86.5 kg)   SpO2 98%   BMI 27.38 kg/m     Wt Readings from Last 3 Encounters:  02/17/18 190 lb 12.8 oz (86.5 kg)  02/07/18 206 lb (93.4 kg)  01/31/18 196 lb 1.3 oz (88.9 kg)     GEN:  Well nourished, well developed in no acute distress HEENT: Normal NECK: No JVD; No carotid bruits LYMPHATICS: No lymphadenopathy CARDIAC: RRR, no murmurs, no rubs, no gallops RESPIRATORY:  Clear to auscultation without rales, wheezing or rhonchi  ABDOMEN: Soft, non-tender, non-distended MUSCULOSKELETAL:  No edema; No deformity  SKIN: Warm and dry NEUROLOGIC:  Alert and oriented x 3 PSYCHIATRIC:  Normal affect   ASSESSMENT:    1. Chronic systolic CHF (congestive heart failure) ([place])   2. Essential  hypertension   3. Diabetes mellitus due to underlying condition with diabetic nephropathy, with long-term current use of insulin ([place])   4. Sarcoidosis   5. Coronary artery disease involving native coronary artery of native heart without angina pectoris    PLAN:    In order of problems listed above:  1. Chronic systolic congestive heart failure with the latest estimation of ejection fraction done 3 years ago which showed 40 to 45%.  He did have some signs and symptoms indicating decompensation of congestive heart failure however his symptoms could be related to pneumonia and swelling of lower extremities could be related to prednisone.  His proBNP was mildly elevated.  Obviously that require clarification, therefore, I will schedule him to have an echocardiogram to reassess his left ventricular ejection fraction.  I will also initiate vasodilatation therapy.  I will start him gently with hydralazine 10 mg 3 times a day and then dose medication will be adjusted based on results of echocardiogram.  Ideally, if he truly got diminished left ventricular ejection fraction he need to be on equivalent of BiDil.  That will also help with the management of his blood pressure. 2. Essential hypertension clearly not well controlled but this is first visit in my office.  I recommended for him to get blood pressure monitor so he can check his blood pressure at home.  I will start hydralazine 10 mg 3 times daily which should help however I do not think we will be able to get him to target with such a small dose of the medication.  With time we will get isosorbide dinitrate plus increased dose of hydralazine that should help with his blood pressure at the same time if he truly have diminished left ventricular ejection fraction that would be beneficial from that point of view and most importantly that should not affect his kidneys. 3. Diabetes mellitus: Better controlled but still some room for improvement he is working  hard with his primary care physician to improve that problem. 4. 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 5. Coronary artery disease with cardiac catheterization done 3 years ago showing only 10% LAD as well  as 10% RCA.  He may require ischemia evaluation in the future but first will assess his left ventricular ejection fraction. 6. Dyslipidemia his LDL is more than 100 need to be less than 70 especially considering his multiple risk factors for coronary artery disease that is so difficult to modified.  I will increase Crestor to 40 mg daily   Medication Adjustments/Labs and Tests Ordered: Current medicines are reviewed at length with the patient today.  Concerns regarding medicines are outlined above.  No orders of the defined types were placed in this encounter.  No orders of the defined types were placed in this encounter.   Signed, Park Liter, MD, [facility]. 02/17/2018 11:40 AM    [facility]

## 2018-02-21 ENCOUNTER — Ambulatory Visit (HOSPITAL_COMMUNITY): Payer: Managed Care, Other (non HMO) | Attending: Cardiology

## 2018-02-21 DIAGNOSIS — I5022 Chronic systolic (congestive) heart failure: Secondary | ICD-10-CM | POA: Diagnosis present

## 2018-02-21 MED ORDER — PERFLUTREN LIPID MICROSPHERE
1.0000 mL | INTRAVENOUS | Status: AC | PRN
  Administered 2018-02-21: 3 mL via INTRAVENOUS

## 2018-02-26 ENCOUNTER — Ambulatory Visit: Payer: Managed Care, Other (non HMO) | Admitting: Internal Medicine

## 2018-02-28 ENCOUNTER — Ambulatory Visit: Payer: Managed Care, Other (non HMO) | Admitting: Endocrinology

## 2018-02-28 DIAGNOSIS — Z0289 Encounter for other administrative examinations: Secondary | ICD-10-CM

## 2018-03-11 ENCOUNTER — Ambulatory Visit (INDEPENDENT_AMBULATORY_CARE_PROVIDER_SITE_OTHER): Payer: Managed Care, Other (non HMO) | Admitting: Internal Medicine

## 2018-03-11 ENCOUNTER — Other Ambulatory Visit: Payer: Self-pay

## 2018-03-11 ENCOUNTER — Encounter: Payer: Self-pay | Admitting: Internal Medicine

## 2018-03-11 VITALS — BP 138/84 | HR 85 | Temp 98.2°F | Ht 70.0 in | Wt 186.0 lb

## 2018-03-11 DIAGNOSIS — N184 Chronic kidney disease, stage 4 (severe): Secondary | ICD-10-CM

## 2018-03-11 DIAGNOSIS — N5201 Erectile dysfunction due to arterial insufficiency: Secondary | ICD-10-CM

## 2018-03-11 DIAGNOSIS — I10 Essential (primary) hypertension: Secondary | ICD-10-CM

## 2018-03-11 MED ORDER — SILDENAFIL CITRATE 20 MG PO TABS: 20.0000 mg | ORAL_TABLET | Freq: Three times a day (TID) | ORAL | 0 refills | Status: DC

## 2018-03-11 MED ORDER — CLONIDINE 0.3 MG/24HR TD PTWK: 0.3000 mg | MEDICATED_PATCH | TRANSDERMAL | 3 refills | Status: DC

## 2018-03-11 NOTE — Assessment & Plan Note (Signed)
Refilled clonidine patch and continue hydralazine and losartan and clonidine and lasix and amlodipine. Seeing nephrology tomorrow and kidney function is worsening lately.

## 2018-03-11 NOTE — Patient Instructions (Addendum)
[facility] has the cheap generic viagra. Address: [address] [place], [place], [place] [address] Phone: [phone]

## 2018-03-11 NOTE — Assessment & Plan Note (Signed)
Rx for sildenafil and given information about pharmacy to get at generic cost.

## 2018-03-11 NOTE — Progress Notes (Signed)
   Subjective:    Patient ID: Brandon Carroll, male    DOB: [birth year], 57 y.o.   MRN: [id number]  HPI The patient is a 57 YO man coming in for follow up of blood pressure (taking hydralazine now from nephrology and recent blood work from them which was not good and going to see them tomorrow, CKD 4 at least, denies headache or chest pains) and ED (was not able to get viagra due to cost, would like to try something, no erection or intercourse in 3 years, denies morning erections).   Review of Systems  Constitutional: Negative.   HENT: Negative.   Eyes: Negative.   Respiratory: Negative for cough, chest tightness and shortness of breath.   Cardiovascular: Negative for chest pain, palpitations and leg swelling.  Gastrointestinal: Negative for abdominal distention, abdominal pain, constipation, diarrhea, nausea and vomiting.  Genitourinary:       ED  Musculoskeletal: Negative.   Skin: Negative.   Neurological: Negative.       Objective:   Physical Exam  Constitutional: He is oriented to person, place, and time. He appears well-developed and well-nourished.  HENT:  Head: Normocephalic and atraumatic.  Eyes: EOM are normal.  Neck: Normal range of motion.  Cardiovascular: Normal rate and regular rhythm.  Pulmonary/Chest: Effort normal and breath sounds normal. No respiratory distress. He has no wheezes. He has no rales.  Musculoskeletal: He exhibits no edema.  Neurological: He is alert and oriented to person, place, and time. Coordination normal.  Skin: Skin is warm and dry.  Psychiatric: He has a normal mood and affect.   Vitals:   03/11/18 1453  BP: 138/84  Pulse: 85  Temp: 98.2 F (36.8 C)  TempSrc: Oral  SpO2: 97%  Weight: 186 lb (84.4 kg)  Height: 5\' 10"  (1.778 m)      Assessment & Plan:

## 2018-03-12 ENCOUNTER — Inpatient Hospital Stay (HOSPITAL_COMMUNITY): Admission: RE | Admit: 2018-03-12 | Payer: Managed Care, Other (non HMO) | Source: Ambulatory Visit

## 2018-03-12 ENCOUNTER — Other Ambulatory Visit (HOSPITAL_COMMUNITY): Payer: Managed Care, Other (non HMO)

## 2018-03-12 ENCOUNTER — Encounter: Payer: Managed Care, Other (non HMO) | Admitting: Vascular Surgery

## 2018-03-13 ENCOUNTER — Encounter: Payer: Self-pay | Admitting: Vascular Surgery

## 2018-03-21 ENCOUNTER — Ambulatory Visit (INDEPENDENT_AMBULATORY_CARE_PROVIDER_SITE_OTHER): Payer: Managed Care, Other (non HMO) | Admitting: Cardiology

## 2018-03-21 ENCOUNTER — Encounter: Payer: Self-pay | Admitting: Cardiology

## 2018-03-21 VITALS — BP 150/68 | HR 61 | Ht 70.0 in | Wt 192.8 lb

## 2018-03-21 DIAGNOSIS — I5022 Chronic systolic (congestive) heart failure: Secondary | ICD-10-CM

## 2018-03-21 DIAGNOSIS — N184 Chronic kidney disease, stage 4 (severe): Secondary | ICD-10-CM

## 2018-03-21 DIAGNOSIS — I10 Essential (primary) hypertension: Secondary | ICD-10-CM

## 2018-03-21 DIAGNOSIS — I2510 Atherosclerotic heart disease of native coronary artery without angina pectoris: Secondary | ICD-10-CM | POA: Diagnosis not present

## 2018-03-21 DIAGNOSIS — R0609 Other forms of dyspnea: Secondary | ICD-10-CM | POA: Diagnosis not present

## 2018-03-21 NOTE — Patient Instructions (Signed)
Medication Instructions:  Your physician recommends that you continue on your current medications as directed. Please refer to the Current Medication list given to you today.  If you need a refill on your cardiac medications before your next appointment, please call your pharmacy.   Lab work: None ordered If you have labs (blood work) drawn today and your tests are completely normal, you will receive your results only by: Marland Kitchen MyChart Message (if you have MyChart) OR . A paper copy in the mail If you have any lab test that is abnormal or we need to change your treatment, we will call you to review the results.  Testing/Procedures: Your physician has requested that you have a lexiscan myoview. For further information please visit [email or web address]. Please follow instruction sheet, as given.  Follow-Up: At [facility], you and your health needs are our priority.  As part of our continuing mission to provide you with exceptional heart care, we have created designated Provider Care Teams.  These Care Teams include your primary Cardiologist (physician) and Advanced Practice Providers (APPs -  Physician Assistants and Nurse Practitioners) who all work together to provide you with the care you need, when you need it. You will need a follow up appointment in 1 months.  You may see Jenne Campus or another member of our [employer] Provider Team in [place]: Shirlee More, MD . Jyl Heinz, MD  Any Other Special Instructions Will Be Listed Below (If Applicable).

## 2018-03-21 NOTE — Progress Notes (Signed)
Cardiology Office Note:    Date:  03/21/2018   ID:  [name], DOB 1960/12/18, MRN [id number]  PCP:  Hoyt Koch, MD  Cardiologist:  Jenne Campus, MD    Referring MD: Hoyt Koch, *   Chief Complaint  Patient presents with  . Follow up on Echo  Doing well  History of Present Illness:    Brandon Carroll is a 57 y.o. male who has past medical history significant for cardiomyopathy ejection fraction 4045% however echocardiogram repeated this time showed normal left ventricular ejection fraction 50 to 55%.  There is some segmental wall motion normalities which obviously is very concerning.  He denies having any chest pain tightness squeezing pressure burning chest.  Swelling of lower extremities is the same.  He was given some medication by nephrologist which make him feel that he stopped the medication he feels better.  Past Medical History:  Diagnosis Date  . Acute systolic CHF (congestive heart failure), NYHA class 2 ([place]) 05/04/2015  . Acute-on-chronic kidney injury ([place]) 05/04/2015  . Diabetes mellitus   . Erectile dysfunction   . Hyperlipidemia   . Hypertension     Past Surgical History:  Procedure Laterality Date  . CARDIAC CATHETERIZATION N/A 06/15/2015   Procedure: Left Heart Cath and Coronary Angiography;  Surgeon: Peter M Martinique, MD;  Location: [place] [facility];  Service: Cardiovascular;  Laterality: N/A;  . CHOLECYSTECTOMY N/A 06/28/2017   Procedure: LAPAROSCOPIC CHOLECYSTECTOMY;  Surgeon: Ralene Ok, MD;  Location: [facility];  Service: General;  Laterality: N/A;  . HERNIA REPAIR  7th grade   umbilical    Current Medications: Current Meds  Medication Sig  . Albuterol Sulfate (PROAIR RESPICLICK) 370 (90 Base) MCG/ACT AEPB Inhale 2 puffs into the lungs every 6 (six) hours as needed.  Marland Kitchen amLODipine (NORVASC) 10 MG tablet Take 1 tablet (10 mg total) by mouth daily.  Marland Kitchen aspirin EC 81 MG tablet Take 81 mg by mouth daily.  . BD PEN  NEEDLE NANO U/F 32G X 4 MM MISC use once daily as directed  . cloNIDine (CATAPRES - DOSED IN MG/24 HR) 0.3 mg/24hr patch Place 1 patch (0.3 mg total) onto the skin once a week.  . Continuous Blood Gluc Receiver (FREESTYLE LIBRE 14 DAY READER) DEVI USE AS DIRECTED  . fluticasone furoate-vilanterol (BREO ELLIPTA) 100-25 MCG/INH AEPB Inhale 1 puff into the lungs daily.  . furosemide (LASIX) 40 MG tablet Take 1 tablet (40 mg total) by mouth daily.  Marland Kitchen glucose blood (ONETOUCH VERIO) test strip 1 each by Other route 2 (two) times daily as needed for other. And lancets 2/day  . hydrALAZINE (APRESOLINE) 10 MG tablet Take 1 tablet (10 mg total) by mouth 3 (three) times daily. (Patient taking differently: Take 25 mg by mouth 3 (three) times daily. )  . Insulin Glargine (LANTUS SOLOSTAR) 100 UNIT/ML Solostar Pen Inject 60 Units into the skin every morning. And pen needles 1/day  . rosuvastatin (CRESTOR) 20 MG tablet Take 2 tablets (40 mg total) by mouth daily.  . sildenafil (REVATIO) 20 MG tablet Take 1-5 tablets (20-100 mg total) by mouth 3 (three) times daily.  Marland Kitchen Spacer/Aero-Holding Chambers (AEROCHAMBER MV) inhaler Use as instructed     Allergies:   Lipitor [atorvastatin]   Social History   Socioeconomic History  . Marital status: Divorced    Spouse name: n/a  . Number of children: 1  . Years of education: 12+  . Highest education level: Not on file  Occupational History  .  Occupation: Building services engineer: [employer]  Social Needs  . Financial resource strain: Not on file  . Food insecurity:    Worry: Not on file    Inability: Not on file  . Transportation needs:    Medical: Not on file    Non-medical: Not on file  Tobacco Use  . Smoking status: Never Smoker  . Smokeless tobacco: Never Used  Substance and Sexual Activity  . Alcohol use: No    Alcohol/week: 0.0 standard drinks  . Drug use: No  . Sexual activity: Not Currently    Comment: Erectile dysfunciotn  Lifestyle  .  Physical activity:    Days per week: Not on file    Minutes per session: Not on file  . Stress: Not on file  Relationships  . Social connections:    Talks on phone: Not on file    Gets together: Not on file    Attends religious service: Not on file    Active member of club or organization: Not on file    Attends meetings of clubs or organizations: Not on file    Relationship status: Not on file  Other Topics Concern  . Not on file  Social History Narrative   Divorced. Education: [employer].    Unable to exercise due to significant SOB and DOE.    Lives alone.   Son lives in [place].     Family History: The patient's family history includes Arthritis in his mother and sister; COPD in his mother and sister; Diabetes in his brother and mother; Hypertension in his father and mother. There is no history of Colon cancer. ROS:   Please see the history of present illness.    All 14 point review of systems negative except as described per history of present illness  EKGs/Labs/Other Studies Reviewed:      Recent Labs: 09/24/2017: Hemoglobin 11.2; Platelets 259.0 02/07/2018: BUN 44; Creatinine, Ser 3.66; Potassium 4.5; Pro B Natriuretic peptide (BNP) 478.0; Sodium 136  Recent Lipid Panel    Component Value Date/Time   CHOL 197 09/24/2017 0823   CHOL 305 (H) 03/21/2017 1124   TRIG 245.0 (H) 09/24/2017 0823   HDL 45.60 09/24/2017 0823   HDL 60 03/21/2017 1124   CHOLHDL 4 09/24/2017 0823   VLDL 49.0 (H) 09/24/2017 0823   LDLCALC 212 (H) 03/21/2017 1124   LDLDIRECT 106.0 09/24/2017 0823    Physical Exam:    VS:  BP (!) 150/68   Pulse 61   Ht 5\' 10"  (1.778 m)   Wt 192 lb 12.8 oz (87.5 kg)   SpO2 98%   BMI 27.66 kg/m     Wt Readings from Last 3 Encounters:  03/21/18 192 lb 12.8 oz (87.5 kg)  03/11/18 186 lb (84.4 kg)  02/17/18 190 lb 12.8 oz (86.5 kg)     GEN:  Well nourished, well developed in no acute distress HEENT: Normal NECK: No JVD; No carotid bruits LYMPHATICS: No  lymphadenopathy CARDIAC: RRR, no murmurs, no rubs, no gallops RESPIRATORY:  Clear to auscultation without rales, wheezing or rhonchi  ABDOMEN: Soft, non-tender, non-distended MUSCULOSKELETAL:  No edema; No deformity  SKIN: Warm and dry LOWER EXTREMITIES: no swelling NEUROLOGIC:  Alert and oriented x 3 PSYCHIATRIC:  Normal affect   ASSESSMENT:    1. Chronic systolic CHF (congestive heart failure) ([place])   2. Coronary artery disease involving native coronary artery of native heart without angina pectoris   3. Essential hypertension   4. Dyspnea on  exertion   5. CKD (chronic kidney disease) stage 4, GFR 15-29 ml/min (H[facility])    PLAN:    In order of problems listed above:  1. Chronic systolic congestive heart failure last echocardiogram ejection fraction 50 to 55% he is on hydralazine which I will continue. 2. Essential hypertension 150/60 today he said that when he check blood pressure at home typically 130 we will continue present management. 3. Dyspnea on exertion stable preserved ejection fraction by echocardiogram 4. Coronary artery disease with segmental wall motion mild on echo will do exercise Cardiolite.   Medication Adjustments/Labs and Tests Ordered: Current medicines are reviewed at length with the patient today.  Concerns regarding medicines are outlined above.  No orders of the defined types were placed in this encounter.  Medication changes: No orders of the defined types were placed in this encounter.   Signed, Park Liter, MD, [facility] 03/21/2018 12:05 PM    [place]

## 2018-03-28 ENCOUNTER — Encounter: Payer: Managed Care, Other (non HMO) | Admitting: Vascular Surgery

## 2018-03-28 ENCOUNTER — Other Ambulatory Visit (HOSPITAL_COMMUNITY): Payer: Managed Care, Other (non HMO)

## 2018-03-28 ENCOUNTER — Encounter (HOSPITAL_COMMUNITY): Payer: Managed Care, Other (non HMO)

## 2018-03-31 ENCOUNTER — Encounter: Payer: Self-pay | Admitting: Vascular Surgery

## 2018-03-31 ENCOUNTER — Ambulatory Visit (HOSPITAL_COMMUNITY)
Admission: RE | Admit: 2018-03-31 | Payer: Managed Care, Other (non HMO) | Source: Ambulatory Visit | Attending: Cardiology | Admitting: Cardiology

## 2018-04-08 ENCOUNTER — Telehealth (HOSPITAL_COMMUNITY): Payer: Self-pay

## 2018-04-08 NOTE — Telephone Encounter (Signed)
Encounter complete. 

## 2018-04-09 ENCOUNTER — Encounter (HOSPITAL_COMMUNITY): Payer: Managed Care, Other (non HMO)

## 2018-04-15 ENCOUNTER — Ambulatory Visit (HOSPITAL_COMMUNITY)
Admission: RE | Admit: 2018-04-15 | Payer: Managed Care, Other (non HMO) | Source: Ambulatory Visit | Attending: Cardiology | Admitting: Cardiology

## 2018-04-22 ENCOUNTER — Telehealth (HOSPITAL_COMMUNITY): Payer: Self-pay

## 2018-04-22 NOTE — Telephone Encounter (Signed)
Encounter complete. 

## 2018-04-24 ENCOUNTER — Ambulatory Visit (HOSPITAL_COMMUNITY)
Admission: RE | Admit: 2018-04-24 | Discharge: 2018-04-24 | Disposition: A | Discharge status: Home / Self Care | Payer: Managed Care, Other (non HMO) | Source: Ambulatory Visit | Attending: Cardiology | Admitting: Cardiology

## 2018-04-24 DIAGNOSIS — I5022 Chronic systolic (congestive) heart failure: Secondary | ICD-10-CM | POA: Diagnosis present

## 2018-04-24 DIAGNOSIS — I2510 Atherosclerotic heart disease of native coronary artery without angina pectoris: Secondary | ICD-10-CM | POA: Diagnosis present

## 2018-04-24 DIAGNOSIS — N184 Chronic kidney disease, stage 4 (severe): Secondary | ICD-10-CM

## 2018-04-24 DIAGNOSIS — R0609 Other forms of dyspnea: Secondary | ICD-10-CM | POA: Diagnosis not present

## 2018-04-24 LAB — MYOCARDIAL PERFUSION IMAGING
CHL CUP NUCLEAR SSS: 0
LV dias vol: 175 mL (ref 62–150)
LV sys vol: 107 mL
Peak HR: 100 {beats}/min
Rest HR: 98 {beats}/min
SDS: 0
SRS: 0
TID: 1.18

## 2018-04-24 MED ORDER — TECHNETIUM TC 99M TETROFOSMIN IV KIT
30.7000 | PACK | Freq: Once | INTRAVENOUS | Status: AC | PRN
  Administered 2018-04-24: 30.7 via INTRAVENOUS
  Filled 2018-04-24: qty 31

## 2018-04-24 MED ORDER — REGADENOSON 0.4 MG/5ML IV SOLN
0.4000 mg | Freq: Once | INTRAVENOUS | Status: AC
  Administered 2018-04-24: 0.4 mg via INTRAVENOUS

## 2018-04-24 MED ORDER — TECHNETIUM TC 99M TETROFOSMIN IV KIT
10.4000 | PACK | Freq: Once | INTRAVENOUS | Status: AC | PRN
  Administered 2018-04-24: 10.4 via INTRAVENOUS
  Filled 2018-04-24: qty 11

## 2018-05-02 ENCOUNTER — Ambulatory Visit (INDEPENDENT_AMBULATORY_CARE_PROVIDER_SITE_OTHER): Payer: Managed Care, Other (non HMO) | Admitting: Cardiology

## 2018-05-02 ENCOUNTER — Encounter: Payer: Self-pay | Admitting: Cardiology

## 2018-05-02 VITALS — BP 172/80 | HR 96 | Wt 194.4 lb

## 2018-05-02 DIAGNOSIS — I2510 Atherosclerotic heart disease of native coronary artery without angina pectoris: Secondary | ICD-10-CM

## 2018-05-02 DIAGNOSIS — E1169 Type 2 diabetes mellitus with other specified complication: Secondary | ICD-10-CM | POA: Diagnosis not present

## 2018-05-02 DIAGNOSIS — E0821 Diabetes mellitus due to underlying condition with diabetic nephropathy: Secondary | ICD-10-CM

## 2018-05-02 DIAGNOSIS — Z794 Long term (current) use of insulin: Secondary | ICD-10-CM

## 2018-05-02 DIAGNOSIS — I5022 Chronic systolic (congestive) heart failure: Secondary | ICD-10-CM | POA: Diagnosis not present

## 2018-05-02 DIAGNOSIS — E785 Hyperlipidemia, unspecified: Secondary | ICD-10-CM

## 2018-05-02 MED ORDER — CARVEDILOL 6.25 MG PO TABS: 6.2500 mg | ORAL_TABLET | Freq: Two times a day (BID) | ORAL | 1 refills | Status: DC

## 2018-05-02 MED ORDER — AMLODIPINE BESYLATE 5 MG PO TABS: 5.0000 mg | ORAL_TABLET | Freq: Every day | ORAL | 1 refills | Status: DC

## 2018-05-02 MED ORDER — ISOSORBIDE MONONITRATE ER 30 MG PO TB24: 30.0000 mg | ORAL_TABLET | Freq: Every day | ORAL | 1 refills | Status: DC

## 2018-05-02 NOTE — Progress Notes (Signed)
Cardiology Office Note:    Date:  05/02/2018   ID:  Brandon Carroll, DOB November 15, 1960, MRN [id number]  PCP:  Hoyt Koch, MD  Cardiologist:  Jenne Campus, MD    Referring MD: Hoyt Koch, *   Chief Complaint  Patient presents with  . 1 month follow up  Doing therapy it looks like he is gotten cold since Wednesday some muscle aches as well as cough  History of Present Illness:    Brandon Carroll is a 57 y.o. male with cardiomyopathy which appears to be ischemic ejection fraction a different technique showed different measurements echocardiogram low normal stress test 40%.  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  He comes today to follow-up overall except for the fact that he is getting cold he is doing well he does have swelling of lower extremities especially the evening time.  No shortness of breath no chest pain tightness squeezing pressure burning chest.  We talked about measures that we can use to improve the function of his heart I think he can benefit from long-acting nitrates before asking to start taking Imdur 30 mg daily to old medical regiment on top of that because of swelling of lower extremities I will lower the dose of amlodipine to 5 mg every single day.  Because of history of coronary artery disease I will do EKG today and if EKG is fine I will restart Coreg will start with 6.25 mg twice daily.  Past Medical History:  Diagnosis Date  . Acute systolic CHF (congestive heart failure), NYHA class 2 ([place]) 05/04/2015  . Acute-on-chronic kidney injury ([place]) 05/04/2015  . Diabetes mellitus   . Erectile dysfunction   . Hyperlipidemia   . Hypertension     Past Surgical History:  Procedure Laterality Date  . CARDIAC CATHETERIZATION N/A 06/15/2015   Procedure: Left Heart Cath and Coronary Angiography;  Surgeon: Peter M Martinique, MD;  Location: [place] [facility];  Service: Cardiovascular;  Laterality: N/A;  .  CHOLECYSTECTOMY N/A 06/28/2017   Procedure: LAPAROSCOPIC CHOLECYSTECTOMY;  Surgeon: Ralene Ok, MD;  Location: [facility];  Service: General;  Laterality: N/A;  . HERNIA REPAIR  7th grade   umbilical    Current Medications: Current Meds  Medication Sig  . Albuterol Sulfate (PROAIR RESPICLICK) 448 (90 Base) MCG/ACT AEPB Inhale 2 puffs into the lungs every 6 (six) hours as needed.  Marland Kitchen amLODipine (NORVASC) 10 MG tablet Take 1 tablet (10 mg total) by mouth daily.  Marland Kitchen aspirin EC 81 MG tablet Take 81 mg by mouth daily.  . BD PEN NEEDLE NANO U/F 32G X 4 MM MISC use once daily as directed  . cloNIDine (CATAPRES - DOSED IN MG/24 HR) 0.3 mg/24hr patch Place 1 patch (0.3 mg total) onto the skin once a week.  . Continuous Blood Gluc Receiver (FREESTYLE LIBRE 14 DAY READER) DEVI USE AS DIRECTED  . fluticasone furoate-vilanterol (BREO ELLIPTA) 100-25 MCG/INH AEPB Inhale 1 puff into the lungs daily.  . furosemide (LASIX) 40 MG tablet Take 1 tablet (40 mg total) by mouth daily.  Marland Kitchen glucose blood (ONETOUCH VERIO) test strip 1 each by Other route 2 (two) times daily as needed for other. And lancets 2/day  . hydrALAZINE (APRESOLINE) 10 MG tablet Take 1 tablet (10 mg total) by mouth 3 (three) times daily. (Patient taking differently: Take 25 mg by mouth 3 (three) times daily. )  . Insulin Glargine (LANTUS SOLOSTAR) 100 UNIT/ML Solostar Pen Inject 60 Units into the  skin every morning. And pen needles 1/day  . rosuvastatin (CRESTOR) 20 MG tablet Take 2 tablets (40 mg total) by mouth daily.  . sildenafil (REVATIO) 20 MG tablet Take 1-5 tablets (20-100 mg total) by mouth 3 (three) times daily.  Marland Kitchen Spacer/Aero-Holding Chambers (AEROCHAMBER MV) inhaler Use as instructed     Allergies:   Lipitor [atorvastatin]   Social History   Socioeconomic History  . Marital status: Divorced    Spouse name: n/a  . Number of children: 1  . Years of education: 12+  . Highest education level: Not on file  Occupational History    . Occupation: Building services engineer: [employer]  Social Needs  . Financial resource strain: Not on file  . Food insecurity:    Worry: Not on file    Inability: Not on file  . Transportation needs:    Medical: Not on file    Non-medical: Not on file  Tobacco Use  . Smoking status: Never Smoker  . Smokeless tobacco: Never Used  Substance and Sexual Activity  . Alcohol use: No    Alcohol/week: 0.0 standard drinks  . Drug use: No  . Sexual activity: Not Currently    Comment: Erectile dysfunciotn  Lifestyle  . Physical activity:    Days per week: Not on file    Minutes per session: Not on file  . Stress: Not on file  Relationships  . Social connections:    Talks on phone: Not on file    Gets together: Not on file    Attends religious service: Not on file    Active member of club or organization: Not on file    Attends meetings of clubs or organizations: Not on file    Relationship status: Not on file  Other Topics Concern  . Not on file  Social History Narrative   Divorced. Education: [employer].    Unable to exercise due to significant SOB and DOE.    Lives alone.   Son lives in [place].     Family History: The patient's family history includes Arthritis in his mother and sister; COPD in his mother and sister; Diabetes in his brother and mother; Hypertension in his father and mother. There is no history of Colon cancer. ROS:   Please see the history of present illness.    All 14 point review of systems negative except as described per history of present illness  EKGs/Labs/Other Studies Reviewed:      Recent Labs: 09/24/2017: Hemoglobin 11.2; Platelets 259.0 02/07/2018: BUN 44; Creatinine, Ser 3.66; Potassium 4.5; Pro B Natriuretic peptide (BNP) 478.0; Sodium 136  Recent Lipid Panel    Component Value Date/Time   CHOL 197 09/24/2017 0823   CHOL 305 (H) 03/21/2017 1124   TRIG 245.0 (H) 09/24/2017 0823   HDL 45.60 09/24/2017 0823   HDL 60 03/21/2017 1124    CHOLHDL 4 09/24/2017 0823   VLDL 49.0 (H) 09/24/2017 0823   LDLCALC 212 (H) 03/21/2017 1124   LDLDIRECT 106.0 09/24/2017 0823    Physical Exam:    VS:  BP (!) 172/80   Pulse 96   Wt 194 lb 6.4 oz (88.2 kg)   SpO2 (!) 12%   BMI 27.89 kg/m     Wt Readings from Last 3 Encounters:  05/02/18 194 lb 6.4 oz (88.2 kg)  04/24/18 192 lb (87.1 kg)  03/21/18 192 lb 12.8 oz (87.5 kg)     GEN:  Well nourished, well developed in no acute distress  HEENT: Normal NECK: No JVD; No carotid bruits LYMPHATICS: No lymphadenopathy CARDIAC: RRR, no murmurs, no rubs, no gallops RESPIRATORY:  Clear to auscultation without rales, wheezing or rhonchi  ABDOMEN: Soft, non-tender, non-distended MUSCULOSKELETAL:  No edema; No deformity  SKIN: Warm and dry LOWER EXTREMITIES: no swelling NEUROLOGIC:  Alert and oriented x 3 PSYCHIATRIC:  Normal affect   ASSESSMENT:    1. Chronic systolic CHF (congestive heart failure) ([place])   2. Coronary artery disease involving native coronary artery of native heart without angina pectoris   3. Diabetes mellitus due to underlying condition with diabetic nephropathy, with long-term current use of insulin ([place])   4. Hyperlipidemia associated with type 2 diabetes mellitus ([place])    PLAN:    In order of problems listed above:  1. Chronic systolic congestive heart failure.  Appears to be fairly compensated but still has some swelling of lower extremities.  Plan as outlined above meaning will augment his vasodilatation therapy as well as will put him back on beta-blocker if EKG is fine 2. Coronary artery disease stable on appropriate medications which I will continue 3. Diabetes stable. 4. Dyslipidemia.  Recently a double the dose of Crestor will recheck his fasting lipid profile   Medication Adjustments/Labs and Tests Ordered: Current medicines are reviewed at length with the patient today.  Concerns regarding medicines are outlined above.  No orders of the defined types  were placed in this encounter.  Medication changes: No orders of the defined types were placed in this encounter.   Signed, Park Liter, MD, [facility] 05/02/2018 4:34 PM    [place]

## 2018-05-02 NOTE — Patient Instructions (Addendum)
Medication Instructions:  Your physician has recommended you make the following change in your medication:  START: Imdur 30 mg daily  START: Carvedilol 6.25 mg twice daily  STOP: Sildenfil  Decrease: Amlodipine 5 mg daily   If you need a refill on your cardiac medications before your next appointment, please call your pharmacy.   Lab work: None.  If you have labs (blood work) drawn today and your tests are completely normal, you will receive your results only by: Marland Kitchen MyChart Message (if you have MyChart) OR . A paper copy in the mail If you have any lab test that is abnormal or we need to change your treatment, we will call you to review the results.  Testing/Procedures: None.   Follow-Up: At [facility], you and your health needs are our priority.  As part of our continuing mission to provide you with exceptional heart care, we have created designated Provider Care Teams.  These Care Teams include your primary Cardiologist (physician) and Advanced Practice Providers (APPs -  Physician Assistants and Nurse Practitioners) who all work together to provide you with the care you need, when you need it. You will need a follow up appointment in 1 months.  Please call our office 2 months in advance to schedule this appointment.  You may see No primary care provider on file. or another member of our [employer] Provider Team in [place]: Shirlee More, MD . Jyl Heinz, MD  Any Other Special Instructions Will Be Listed Below (If Applicable).  Isosorbide Mononitrate extended-release tablets What is this medicine? ISOSORBIDE MONONITRATE (eye soe SOR bide mon oh NYE trate) is a vasodilator. It relaxes blood vessels, increasing the blood and oxygen supply to your heart. This medicine is used to prevent chest pain caused by angina. It will not help to stop an episode of chest pain. This medicine may be used for other purposes; ask your health care provider or pharmacist if you have  questions. COMMON BRAND NAME(S): Imdur, Isotrate ER What should I tell my health care provider before I take this medicine? They need to know if you have any of these conditions: -previous heart attack or heart failure -an unusual or allergic reaction to isosorbide mononitrate, nitrates, other medicines, foods, dyes, or preservatives -pregnant or trying to get pregnant -breast-feeding How should I use this medicine? Take this medicine by mouth with a glass of water. Follow the directions on the prescription label. Do not crush or chew. Take your medicine at regular intervals. Do not take your medicine more often than directed. Do not stop taking this medicine except on the advice of your doctor or health care professional. Talk to your pediatrician regarding the use of this medicine in children. Special care may be needed. Overdosage: If you think you have taken too much of this medicine contact a poison control center or emergency room at once. NOTE: This medicine is only for you. Do not share this medicine with others. What if I miss a dose? If you miss a dose, take it as soon as you can. If it is almost time for your next dose, take only that dose. Do not take double or extra doses. What may interact with this medicine? Do not take this medicine with any of the following medications: -medicines used to treat erectile dysfunction (ED) like avanafil, sildenafil, tadalafil, and vardenafil -riociguat This medicine may also interact with the following medications: -medicines for high blood pressure -other medicines for angina or heart failure This list  may not describe all possible interactions. Give your health care provider a list of all the medicines, herbs, non-prescription drugs, or dietary supplements you use. Also tell them if you smoke, drink alcohol, or use illegal drugs. Some items may interact with your medicine. What should I watch for while using this medicine? Check your heart  rate and blood pressure regularly while you are taking this medicine. Ask your doctor or health care professional what your heart rate and blood pressure should be and when you should contact him or her. Tell your doctor or health care professional if you feel your medicine is no longer working. You may get dizzy. Do not drive, use machinery, or do anything that needs mental alertness until you know how this medicine affects you. To reduce the risk of dizzy or fainting spells, do not sit or stand up quickly, especially if you are an older patient. Alcohol can make you more dizzy, and increase flushing and rapid heartbeats. Avoid alcoholic drinks. Do not treat yourself for coughs, colds, or pain while you are taking this medicine without asking your doctor or health care professional for advice. Some ingredients may increase your blood pressure. What side effects may I notice from receiving this medicine? Side effects that you should report to your doctor or health care professional as soon as possible: -bluish discoloration of lips, fingernails, or palms of hands -irregular heartbeat, palpitations -low blood pressure -nausea, vomiting -persistent headache -unusually weak or tired Side effects that usually do not require medical attention (report to your doctor or health care professional if they continue or are bothersome): -flushing of the face or neck -rash This list may not describe all possible side effects. Call your doctor for medical advice about side effects. You may report side effects to FDA at 1-800-FDA-1088. Where should I keep my medicine? Keep out of the reach of children. Store between 15 and 30 degrees C (59 and 86 degrees F). Keep container tightly closed. Throw away any unused medicine after the expiration date. NOTE: This sheet is a summary. It may not cover all possible information. If you have questions about this medicine, talk to your doctor, pharmacist, or health care  provider.  2019 Elsevier/Gold Standard (2013-02-27 14:48:19)   Carvedilol tablets What is this medicine? CARVEDILOL (KAR ve dil ol) is a beta-blocker. Beta-blockers reduce the workload on the heart and help it to beat more regularly. This medicine is used to treat high blood pressure and heart failure. This medicine may be used for other purposes; ask your health care provider or pharmacist if you have questions. COMMON BRAND NAME(S): Coreg What should I tell my health care provider before I take this medicine? They need to know if you have any of these conditions: -circulation problems -diabetes -history of heart attack or heart disease -liver disease -lung or breathing disease, like asthma or emphysema -pheochromocytoma -slow or irregular heartbeat -thyroid disease -an unusual or allergic reaction to carvedilol, other beta-blockers, medicines, foods, dyes, or preservatives -pregnant or trying to get pregnant -breast-feeding How should I use this medicine? Take this medicine by mouth with a glass of water. Follow the directions on the prescription label. It is best to take the tablets with food. Take your doses at regular intervals. Do not take your medicine more often than directed. Do not stop taking except on the advice of your doctor or health care professional. Talk to your pediatrician regarding the use of this medicine in children. Special care may be needed. Overdosage:  If you think you have taken too much of this medicine contact a poison control center or emergency room at once. NOTE: This medicine is only for you. Do not share this medicine with others. What if I miss a dose? If you miss a dose, take it as soon as you can. If it is almost time for your next dose, take only that dose. Do not take double or extra doses. What may interact with this medicine? This medicine may interact with the following medications: -certain medicines for blood pressure, heart disease,  irregular heart beat -certain medicines for depression, like fluoxetine or paroxetine -certain medicines for diabetes, like glipizide or glyburide -cimetidine -clonidine -cyclosporine -digoxin -MAOIs like Carbex, Eldepryl, Marplan, Nardil, and Parnate -reserpine -rifampin This list may not describe all possible interactions. Give your health care provider a list of all the medicines, herbs, non-prescription drugs, or dietary supplements you use. Also tell them if you smoke, drink alcohol, or use illegal drugs. Some items may interact with your medicine. What should I watch for while using this medicine? Check your heart rate and blood pressure regularly while you are taking this medicine. Ask your doctor or health care professional what your heart rate and blood pressure should be, and when you should contact him or her. Do not stop taking this medicine suddenly. This could lead to serious heart-related effects. Contact your doctor or health care professional if you have difficulty breathing while taking this drug. Check your weight daily. Ask your doctor or health care professional when you should notify him/her of any weight gain. You may get drowsy or dizzy. Do not drive, use machinery, or do anything that requires mental alertness until you know how this medicine affects you. To reduce the risk of dizzy or fainting spells, do not sit or stand up quickly. Alcohol can make you more drowsy, and increase flushing and rapid heartbeats. Avoid alcoholic drinks. If you have diabetes, check your blood sugar as directed. Tell your doctor if you have changes in your blood sugar while you are taking this medicine. If you are going to have surgery, tell your doctor or health care professional that you are taking this medicine. What side effects may I notice from receiving this medicine? Side effects that you should report to your doctor or health care professional as soon as possible: -allergic reactions  like skin rash, itching or hives, swelling of the face, lips, or tongue -breathing problems -dark urine -irregular heartbeat -swollen legs or ankles -vomiting -yellowing of the eyes or skin Side effects that usually do not require medical attention (report to your doctor or health care professional if they continue or are bothersome): -change in sex drive or performance -diarrhea -dry eyes (especially if wearing contact lenses) -dry, itching skin -headache -nausea -unusually tired This list may not describe all possible side effects. Call your doctor for medical advice about side effects. You may report side effects to FDA at 1-800-FDA-1088. Where should I keep my medicine? Keep out of the reach of children. Store at room temperature below 30 degrees C (86 degrees F). Protect from moisture. Keep container tightly closed. Throw away any unused medicine after the expiration date. NOTE: This sheet is a summary. It may not cover all possible information. If you have questions about this medicine, talk to your doctor, pharmacist, or health care provider.  2019 Elsevier/Gold Standard (2013-01-04 14:12:02)

## 2018-05-08 ENCOUNTER — Ambulatory Visit: Payer: Self-pay

## 2018-05-08 ENCOUNTER — Encounter: Payer: Self-pay | Admitting: Internal Medicine

## 2018-05-08 ENCOUNTER — Ambulatory Visit (INDEPENDENT_AMBULATORY_CARE_PROVIDER_SITE_OTHER): Payer: Managed Care, Other (non HMO) | Admitting: Internal Medicine

## 2018-05-08 ENCOUNTER — Ambulatory Visit (INDEPENDENT_AMBULATORY_CARE_PROVIDER_SITE_OTHER)
Admission: RE | Admit: 2018-05-08 | Discharge: 2018-05-08 | Disposition: A | Discharge status: Home / Self Care | Payer: Managed Care, Other (non HMO) | Source: Ambulatory Visit | Attending: Internal Medicine | Admitting: Internal Medicine

## 2018-05-08 DIAGNOSIS — J069 Acute upper respiratory infection, unspecified: Secondary | ICD-10-CM

## 2018-05-08 DIAGNOSIS — D869 Sarcoidosis, unspecified: Secondary | ICD-10-CM

## 2018-05-08 DIAGNOSIS — I5022 Chronic systolic (congestive) heart failure: Secondary | ICD-10-CM

## 2018-05-08 DIAGNOSIS — B9789 Other viral agents as the cause of diseases classified elsewhere: Secondary | ICD-10-CM

## 2018-05-08 LAB — POC INFLUENZA A&B (BINAX/QUICKVUE)
Influenza A, POC: NEGATIVE
Influenza B, POC: NEGATIVE

## 2018-05-08 MED ORDER — PREDNISONE 20 MG PO TABS: 40.0000 mg | ORAL_TABLET | Freq: Every day | ORAL | 0 refills | Status: DC

## 2018-05-08 NOTE — Assessment & Plan Note (Signed)
Does not appear to be in flare today as symptoms are acute without weight gain. Will not adjust diuretic today. If no improvement with treatment will need reassessment.

## 2018-05-08 NOTE — Telephone Encounter (Signed)
Noted  

## 2018-05-08 NOTE — Telephone Encounter (Signed)
Pt. Reports he has sarcoidosis and "always has a dry cough." Has a runny nose, wheezing, and started having shortness of breath last night. "I couldn't lay down last night." "I have to be seen before it gets bad." Appointment made for this morning with his provider.  Reason for Disposition . [1] Continuous (nonstop) coughing AND [2] keeps from working or sleeping  Answer Assessment - Initial Assessment Questions 1. RESPIRATORY STATUS: "Describe your breathing?" (e.g., wheezing, shortness of breath, unable to speak, severe coughing)      Shortness of breath when he lays down. Wheezing. Dry cough 2. ONSET: "When did this breathing problem begin?"      Last night 3. PATTERN "Does the difficult breathing come and go, or has it been constant since it started?"      Constant 4. SEVERITY: "How bad is your breathing?" (e.g., mild, moderate, severe)    - MILD: No SOB at rest, mild SOB with walking, speaks normally in sentences, can lay down, no retractions, pulse < 100.    - MODERATE: SOB at rest, SOB with minimal exertion and prefers to sit, cannot lie down flat, speaks in phrases, mild retractions, audible wheezing, pulse 100-120.    - SEVERE: Very SOB at rest, speaks in single words, struggling to breathe, sitting hunched forward, retractions, pulse > 120      Moderate 5. RECURRENT SYMPTOM: "Have you had difficulty breathing before?" If so, ask: "When was the last time?" and "What happened that time?"      Yes 6. CARDIAC HISTORY: "Do you have any history of heart disease?" (e.g., heart attack, angina, bypass surgery, angioplasty)      Yes-  7. LUNG HISTORY: "Do you have any history of lung disease?"  (e.g., pulmonary embolus, asthma, emphysema)     Sarcoidosis 8. CAUSE: "What do you think is causing the breathing problem?"      Sarcoid 9. OTHER SYMPTOMS: "Do you have any other symptoms? (e.g., dizziness, runny nose, cough, chest pain, fever)     Runny nose - Clear 10. PREGNANCY: "Is there any  chance you are pregnant?" "When was your last menstrual period?"       n/a 11. TRAVEL: "Have you traveled out of the country in the last month?" (e.g., travel history, exposures)       No  Protocols used: BREATHING DIFFICULTY-A-AH

## 2018-05-08 NOTE — Assessment & Plan Note (Signed)
Could be re-activating and will give prednisone short course. If not helpful may need further assessment.

## 2018-05-08 NOTE — Assessment & Plan Note (Signed)
POC flu test done which is negative, CXR ordered. Rx for prednisone for SOB and adjust as needed based on CXR. Could be related to some aspiration with vomiting which occurred prior to onset of symptoms.

## 2018-05-08 NOTE — Patient Instructions (Signed)
We have sent in prednisone to take 2 pills daily for 5 days starting today.  We are doing the chest x-ray today and will call you back about the results.

## 2018-05-08 NOTE — Progress Notes (Signed)
   Subjective:   Patient ID: Brandon Carroll, male    DOB: Oct 09, 1960, 57 y.o.   MRN: [id number]  HPI The patient is a 57 y.o. man coming in for cold symptoms. Does have history of sarcoid although this has not been active recently. Started 2 days ago. Main symptoms are: SOB, cough, nose drainage. Denies fevers or chills. Overall it is worsening. Has tried nothing for this. Did vomit the day prior to symptom onset. Denies chest pains. Saw cardiology last week without changes. Does have significant nasal drainage but main worrisome symptom is SOB. Denies weight change recently.   Review of Systems  Constitutional: Positive for activity change, appetite change and fatigue. Negative for chills, fever and unexpected weight change.  HENT: Positive for congestion, postnasal drip, rhinorrhea and sinus pressure. Negative for ear discharge, ear pain, sinus pain, sneezing, sore throat, tinnitus, trouble swallowing and voice change.   Eyes: Negative.   Respiratory: Positive for cough, chest tightness and shortness of breath. Negative for wheezing.   Cardiovascular: Negative.   Gastrointestinal: Negative.   Musculoskeletal: Positive for myalgias.  Neurological: Negative.     Objective:  Physical Exam Constitutional:      Appearance: He is well-developed.  HENT:     Head: Normocephalic and atraumatic.     Comments: Oropharynx with redness and clear drainage, nose with swollen turbinates, TMs normal bilaterally.  Neck:     Musculoskeletal: Normal range of motion.     Thyroid: No thyromegaly.  Cardiovascular:     Rate and Rhythm: Normal rate and regular rhythm.  Pulmonary:     Effort: Pulmonary effort is normal. No respiratory distress.     Breath sounds: Normal breath sounds. No wheezing or rales.  Abdominal:     Palpations: Abdomen is soft.  Musculoskeletal:        General: Tenderness present.  Lymphadenopathy:     Cervical: No cervical adenopathy.  Skin:    General: Skin is warm and  dry.  Neurological:     Mental Status: He is alert and oriented to person, place, and time.     Vitals:   05/08/18 0948  BP: (!) 160/86  Pulse: 80  Temp: 98.3 F (36.8 C)  TempSrc: Oral  SpO2: 93%  Weight: 189 lb (85.7 kg)  Height: 5\' 10"  (1.778 m)    Assessment & Plan:

## 2018-05-17 ENCOUNTER — Ambulatory Visit: Payer: Managed Care, Other (non HMO) | Admitting: Internal Medicine

## 2018-05-20 ENCOUNTER — Other Ambulatory Visit (INDEPENDENT_AMBULATORY_CARE_PROVIDER_SITE_OTHER): Payer: Managed Care, Other (non HMO)

## 2018-05-20 ENCOUNTER — Ambulatory Visit (INDEPENDENT_AMBULATORY_CARE_PROVIDER_SITE_OTHER): Payer: Managed Care, Other (non HMO) | Admitting: Internal Medicine

## 2018-05-20 ENCOUNTER — Encounter: Payer: Self-pay | Admitting: Internal Medicine

## 2018-05-20 ENCOUNTER — Ambulatory Visit: Payer: Self-pay

## 2018-05-20 VITALS — BP 144/90 | HR 74 | Temp 98.4°F | Ht 70.0 in | Wt 198.0 lb

## 2018-05-20 DIAGNOSIS — E1121 Type 2 diabetes mellitus with diabetic nephropathy: Secondary | ICD-10-CM | POA: Diagnosis not present

## 2018-05-20 DIAGNOSIS — Z794 Long term (current) use of insulin: Secondary | ICD-10-CM

## 2018-05-20 DIAGNOSIS — R0609 Other forms of dyspnea: Secondary | ICD-10-CM | POA: Diagnosis not present

## 2018-05-20 DIAGNOSIS — N184 Chronic kidney disease, stage 4 (severe): Secondary | ICD-10-CM | POA: Diagnosis not present

## 2018-05-20 DIAGNOSIS — E0821 Diabetes mellitus due to underlying condition with diabetic nephropathy: Secondary | ICD-10-CM

## 2018-05-20 LAB — CBC WITH DIFFERENTIAL/PLATELET
BASOS ABS: 0.1 10*3/uL (ref 0.0–0.1)
Basophils Relative: 1.1 % (ref 0.0–3.0)
Eosinophils Absolute: 0.2 10*3/uL (ref 0.0–0.7)
Eosinophils Relative: 2.5 % (ref 0.0–5.0)
HCT: 29.9 % — ABNORMAL LOW (ref 39.0–52.0)
Hemoglobin: 9.8 g/dL — ABNORMAL LOW (ref 13.0–17.0)
Lymphocytes Relative: 10.3 % — ABNORMAL LOW (ref 12.0–46.0)
Lymphs Abs: 1 10*3/uL (ref 0.7–4.0)
MCHC: 32.8 g/dL (ref 30.0–36.0)
MCV: 76.1 fl — ABNORMAL LOW (ref 78.0–100.0)
Monocytes Absolute: 1.1 10*3/uL — ABNORMAL HIGH (ref 0.1–1.0)
Monocytes Relative: 12.2 % — ABNORMAL HIGH (ref 3.0–12.0)
Neutro Abs: 6.8 10*3/uL (ref 1.4–7.7)
Neutrophils Relative %: 73.9 % (ref 43.0–77.0)
Platelets: 276 10*3/uL (ref 150.0–400.0)
RBC: 3.93 Mil/uL — ABNORMAL LOW (ref 4.22–5.81)
RDW: 15.8 % — ABNORMAL HIGH (ref 11.5–15.5)
WBC: 9.3 10*3/uL (ref 4.0–10.5)

## 2018-05-20 LAB — BRAIN NATRIURETIC PEPTIDE: Pro B Natriuretic peptide (BNP): 1460 pg/mL — ABNORMAL HIGH (ref 0.0–100.0)

## 2018-05-20 LAB — HEMOGLOBIN A1C: Hgb A1c MFr Bld: 6.6 % — ABNORMAL HIGH (ref 4.6–6.5)

## 2018-05-20 MED ORDER — FUROSEMIDE 40 MG PO TABS: 40.0000 mg | ORAL_TABLET | Freq: Two times a day (BID) | ORAL | 5 refills | Status: DC

## 2018-05-20 NOTE — Assessment & Plan Note (Signed)
stable overall by history and exam, recent data reviewed with pt, and pt to continue medical treatment as before,  to f/u any worsening symptoms or concerns  

## 2018-05-20 NOTE — Progress Notes (Signed)
Subjective:    Patient ID: Brandon Carroll, male    DOB: 12-Aug-1960, 58 y.o.   MRN: [id number]  HPI  Here to f/u with > 1 wk gradually worsening sob/doe now with marked fatigue and exhaustion with just walking in from the parking lot; finished work today but feels unable to continue tomorrow.  Also with mild nonprod cough, dizziness, bilat lower anterior chest funny feeling.  No fever, Recent tx with prednisone did not hep, and inhalers not working today as well.  Wt seems quite a bit up after prednisone, with persistent mild leg swelling.  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  States good compliance with lasix 40 qd.  No hx of DVT or PE.   Wt Readings from Last 3 Encounters:  05/20/18 198 lb (89.8 kg)  05/08/18 189 lb (85.7 kg)  05/02/18 194 lb 6.4 oz (88.2 kg)   Past Medical History:  Diagnosis Date  . Acute systolic CHF (congestive heart failure), NYHA class 2 ([place]) 05/04/2015  . Acute-on-chronic kidney injury ([place]) 05/04/2015  . Diabetes mellitus   . Erectile dysfunction   . Hyperlipidemia   . Hypertension    Past Surgical History:  Procedure Laterality Date  . CARDIAC CATHETERIZATION N/A 06/15/2015   Procedure: Left Heart Cath and Coronary Angiography;  Surgeon: Peter M Martinique, MD;  Location: [facility];  Service: Cardiovascular;  Laterality: N/A;  . CHOLECYSTECTOMY N/A 06/28/2017   Procedure: LAPAROSCOPIC CHOLECYSTECTOMY;  Surgeon: Ralene Ok, MD;  Location: [facility];  Service: General;  Laterality: N/A;  . HERNIA REPAIR  7th grade   umbilical    reports that he has never smoked. He has never used smokeless tobacco. He reports that he does not drink alcohol or use drugs. family history includes Arthritis in his mother and sister; COPD in his mother and sister; Diabetes in his brother and mother; Hypertension in his father and mother. Allergies  Allergen Reactions  . Lipitor [Atorvastatin] Hives and Itching   Current  Outpatient Medications on File Prior to Visit  Medication Sig Dispense Refill  . Albuterol Sulfate (PROAIR RESPICLICK) 361 (90 Base) MCG/ACT AEPB Inhale 2 puffs into the lungs every 6 (six) hours as needed. 1 each 1  . amLODipine (NORVASC) 5 MG tablet Take 1 tablet (5 mg total) by mouth daily. 90 tablet 1  . aspirin EC 81 MG tablet Take 81 mg by mouth daily.    . BD PEN NEEDLE NANO U/F 32G X 4 MM MISC use once daily as directed 100 each PRN  . carvedilol (COREG) 6.25 MG tablet Take 1 tablet (6.25 mg total) by mouth 2 (two) times daily. 180 tablet 1  . cloNIDine (CATAPRES - DOSED IN MG/24 HR) 0.3 mg/24hr patch Place 1 patch (0.3 mg total) onto the skin once a week. 12 patch 3  . Continuous Blood Gluc Receiver (FREESTYLE LIBRE 14 DAY READER) DEVI USE AS DIRECTED 6 Device 3  . fluticasone furoate-vilanterol (BREO ELLIPTA) 100-25 MCG/INH AEPB Inhale 1 puff into the lungs daily. 1 each 1  . glucose blood (ONETOUCH VERIO) test strip 1 each by Other route 2 (two) times daily as needed for other. And lancets 2/day 100 each 12  . hydrALAZINE (APRESOLINE) 10 MG tablet Take 1 tablet (10 mg total) by mouth 3 (three) times daily. (Patient taking differently: Take 25 mg by mouth 3 (three) times daily. ) 180 tablet 3  . Insulin Glargine (LANTUS SOLOSTAR) 100 UNIT/ML  Solostar Pen Inject 60 Units into the skin every morning. And pen needles 1/day 10 pen PRN  . isosorbide mononitrate (IMDUR) 30 MG 24 hr tablet Take 1 tablet (30 mg total) by mouth daily. 90 tablet 1  . predniSONE (DELTASONE) 20 MG tablet Take 2 tablets (40 mg total) by mouth daily with breakfast. 10 tablet 0  . rosuvastatin (CRESTOR) 20 MG tablet Take 2 tablets (40 mg total) by mouth daily. 90 tablet 3  . Spacer/Aero-Holding Chambers (AEROCHAMBER MV) inhaler Use as instructed 1 each 0   No current facility-administered medications on file prior to visit.    Review of Systems  Constitutional: Negative for other unusual diaphoresis or sweats HENT:  Negative for ear discharge or swelling Eyes: Negative for other worsening visual disturbances Respiratory: Negative for stridor or other swelling  Gastrointestinal: Negative for worsening distension or other blood Genitourinary: Negative for retention or other urinary change Musculoskeletal: Negative for other MSK pain or swelling Skin: Negative for color change or other new lesions Neurological: Negative for worsening tremors and other numbness  Psychiatric/Behavioral: Negative for worsening agitation or other fatigue All other system neg per pt    Objective:   Physical Exam BP (!) 144/90   Pulse 74   Temp 98.4 F (36.9 C) (Oral)   Ht 5\' 10"  (1.778 m)   Wt 198 lb (89.8 kg)   SpO2 98%   BMI 28.41 kg/m  VS noted,  Constitutional: Pt appears in NAD HENT: Head: NCAT.  Right Ear: External ear normal.  Left Ear: External ear normal.  Eyes: . Pupils are equal, round, and reactive to light. Conjunctivae and EOM are normal Nose: without d/c or deformity Neck: Neck supple. Gross normal ROM Cardiovascular: Normal rate and regular rhythm.   Pulmonary/Chest: Effort normal and breath sounds decreased without overt rales or wheezing.  Abd:  Soft, NT, ND, + BS, no organomegaly Neurological: Pt is alert. At baseline orientation, motor grossly intact Skin: Skin is warm. No rashes, other new lesions, trace to 1+ bilat LE edema Psychiatric: Pt behavior is normal without agitation  No other exam findings  Lab Results  Component Value Date   WBC 9.0 09/24/2017   HGB 11.2 (L) 09/24/2017   HCT 33.8 (L) 09/24/2017   PLT 259.0 09/24/2017   GLUCOSE 95 02/07/2018   CHOL 197 09/24/2017   TRIG 245.0 (H) 09/24/2017   HDL 45.60 09/24/2017   LDLDIRECT 106.0 09/24/2017   LDLCALC 212 (H) 03/21/2017   ALT 26 03/21/2017   AST 37 03/21/2017   NA 136 02/07/2018   K 4.5 02/07/2018   CL 108 02/07/2018   CREATININE 3.66 (H) 02/07/2018   BUN 44 (H) 02/07/2018   CO2 22 02/07/2018   TSH 2.400  03/21/2017   PSA 0.84 08/20/2011   INR 0.97 06/10/2015   HGBA1C 7.9 (A) 11/28/2017   MICROALBUR 86.1 (H) 08/04/2014       Assessment & Plan:

## 2018-05-20 NOTE — Patient Instructions (Signed)
OK to increase the lasix (furosemide) to 40 mg twice per day (I sent a new prescription)  Please continue all other medications as before, and refills have been done if requested.  Please have the pharmacy call with any other refills you may need.  Please keep your appointments with your specialists as you may have planned  - Pulmonary for Jan 13, and Cardiology Jan 28  You will be contacted regarding the referral for: CTA (CT scan) of the chest  Please go to the LAB in the Basement (turn left off the elevator) for the tests to be done today  You will be contacted by phone if any changes need to be made immediately.  Otherwise, you will receive a letter about your results with an explanation, but please check with MyChart first.  Please remember to sign up for MyChart if you have not done so, as this will be important to you in the future with finding out test results, communicating by private email, and scheduling acute appointments online when needed.

## 2018-05-20 NOTE — Assessment & Plan Note (Addendum)
Etiology unclear, but suspect mild but significant fluid retention related to CM, CKD and possibly recent prednisone; for labs including BNP and D dimer as ordered, and CTA chest to r/o PE vs CHF vs worsening sarcoid vs other; also to increase lasix 40 bid; note pt has f/u with pulmonary Jan 13, and Cardiology Jan 28  Note:  Total time for pt hx, exam, review of record with pt in the room, determination of diagnoses and plan for further eval and tx is > 40 min, with over 50% spent in coordination and counseling of patient including the differential dx, tx, further evaluation and other management of dyspnea, DM, CKD

## 2018-05-20 NOTE — Telephone Encounter (Signed)
Pt. Seen 05/08/18 with cough - finished the Prednisone.Cough has continued and "is a little worse." Shortness of breath with minimal exertion. Non-productive cough with wheezing. No availability with Dr. [name] today.Has Pulmonary consult 05/26/18.  Reason for Disposition . [1] Continuous (nonstop) coughing interferes with work or school AND [2] no improvement using cough treatment per protocol  Answer Assessment - Initial Assessment Questions 1. ONSET: "When did the cough begin?"      Started before Christmas 2. SEVERITY: "How bad is the cough today?"      Severe 3. RESPIRATORY DISTRESS: "Describe your breathing."      Shortness of breath with minimal exertion 4. FEVER: "Do you have a fever?" If so, ask: "What is your temperature, how was it measured, and when did it start?"     No 5. HEMOPTYSIS: "Are you coughing up any blood?" If so ask: "How much?" (flecks, streaks, tablespoons, etc.)     No 6. TREATMENT: "What have you done so far to treat the cough?" (e.g., meds, fluids, humidifier)     Finished the Prednisone 7. CARDIAC HISTORY: "Do you have any history of heart disease?" (e.g., heart attack, congestive heart failure)      Yes 8. LUNG HISTORY: "Do you have any history of lung disease?"  (e.g., pulmonary embolus, asthma, emphysema)     Sarcoidosis 9. PE RISK FACTORS: "Do you have a history of blood clots?" (or: recent major surgery, recent prolonged travel, bedridden)     No 10. OTHER SYMPTOMS: "Do you have any other symptoms? (e.g., runny nose, wheezing, chest pain)       Wheezing and rattling 11. PREGNANCY: "Is there any chance you are pregnant?" "When was your last menstrual period?"       n/a 12. TRAVEL: "Have you traveled out of the country in the last month?" (e.g., travel history, exposures)       No  Protocols used: COUGH - ACUTE NON-PRODUCTIVE-A-AH

## 2018-05-20 NOTE — Assessment & Plan Note (Signed)
For lab f/u today,  to f/u any worsening symptoms or concerns

## 2018-05-21 ENCOUNTER — Encounter: Payer: Self-pay | Admitting: Internal Medicine

## 2018-05-21 ENCOUNTER — Inpatient Hospital Stay: Admission: RE | Admit: 2018-05-21 | Payer: Managed Care, Other (non HMO) | Source: Ambulatory Visit

## 2018-05-21 LAB — BASIC METABOLIC PANEL
BUN: 45 mg/dL — ABNORMAL HIGH (ref 6–23)
CALCIUM: 8.4 mg/dL (ref 8.4–10.5)
CO2: 22 mEq/L (ref 19–32)
Chloride: 107 mEq/L (ref 96–112)
Creatinine, Ser: 4.35 mg/dL — ABNORMAL HIGH (ref 0.40–1.50)
GFR: 18.29 mL/min — ABNORMAL LOW (ref >60.00)
Glucose, Bld: 71 mg/dL (ref 70–99)
Potassium: 4.5 mEq/L (ref 3.5–5.1)
Sodium: 138 mEq/L (ref 135–145)

## 2018-05-21 LAB — D-DIMER, QUANTITATIVE (NOT AT ARMC): D DIMER QUANT: 1.11 ug{FEU}/mL — AB (ref <0.50)

## 2018-05-21 LAB — TSH: TSH: 1.71 u[IU]/mL (ref 0.35–4.50)

## 2018-05-22 ENCOUNTER — Telehealth: Payer: Self-pay

## 2018-05-22 ENCOUNTER — Inpatient Hospital Stay (HOSPITAL_BASED_OUTPATIENT_CLINIC_OR_DEPARTMENT_OTHER)
Admission: EM | Admit: 2018-05-22 | Discharge: 2018-05-27 | DRG: 673 | Disposition: A | Discharge status: Home / Self Care | Payer: Managed Care, Other (non HMO) | Attending: Family Medicine | Admitting: Family Medicine

## 2018-05-22 ENCOUNTER — Emergency Department (HOSPITAL_BASED_OUTPATIENT_CLINIC_OR_DEPARTMENT_OTHER): Payer: Managed Care, Other (non HMO)

## 2018-05-22 ENCOUNTER — Other Ambulatory Visit: Payer: Self-pay

## 2018-05-22 ENCOUNTER — Encounter (HOSPITAL_BASED_OUTPATIENT_CLINIC_OR_DEPARTMENT_OTHER): Payer: Self-pay | Admitting: *Deleted

## 2018-05-22 DIAGNOSIS — N185 Chronic kidney disease, stage 5: Secondary | ICD-10-CM | POA: Diagnosis present

## 2018-05-22 DIAGNOSIS — I5023 Acute on chronic systolic (congestive) heart failure: Secondary | ICD-10-CM | POA: Diagnosis present

## 2018-05-22 DIAGNOSIS — D631 Anemia in chronic kidney disease: Secondary | ICD-10-CM | POA: Diagnosis present

## 2018-05-22 DIAGNOSIS — E1122 Type 2 diabetes mellitus with diabetic chronic kidney disease: Secondary | ICD-10-CM | POA: Diagnosis present

## 2018-05-22 DIAGNOSIS — Z794 Long term (current) use of insulin: Secondary | ICD-10-CM | POA: Diagnosis not present

## 2018-05-22 DIAGNOSIS — R7989 Other specified abnormal findings of blood chemistry: Secondary | ICD-10-CM

## 2018-05-22 DIAGNOSIS — Z79899 Other long term (current) drug therapy: Secondary | ICD-10-CM

## 2018-05-22 DIAGNOSIS — N179 Acute kidney failure, unspecified: Secondary | ICD-10-CM | POA: Diagnosis present

## 2018-05-22 DIAGNOSIS — N184 Chronic kidney disease, stage 4 (severe): Secondary | ICD-10-CM | POA: Diagnosis not present

## 2018-05-22 DIAGNOSIS — Z833 Family history of diabetes mellitus: Secondary | ICD-10-CM

## 2018-05-22 DIAGNOSIS — D869 Sarcoidosis, unspecified: Secondary | ICD-10-CM | POA: Diagnosis present

## 2018-05-22 DIAGNOSIS — Z888 Allergy status to other drugs, medicaments and biological substances status: Secondary | ICD-10-CM

## 2018-05-22 DIAGNOSIS — Z7952 Long term (current) use of systemic steroids: Secondary | ICD-10-CM | POA: Diagnosis not present

## 2018-05-22 DIAGNOSIS — I5043 Acute on chronic combined systolic (congestive) and diastolic (congestive) heart failure: Secondary | ICD-10-CM | POA: Diagnosis present

## 2018-05-22 DIAGNOSIS — N2581 Secondary hyperparathyroidism of renal origin: Secondary | ICD-10-CM | POA: Diagnosis present

## 2018-05-22 DIAGNOSIS — E1129 Type 2 diabetes mellitus with other diabetic kidney complication: Secondary | ICD-10-CM | POA: Diagnosis present

## 2018-05-22 DIAGNOSIS — I160 Hypertensive urgency: Secondary | ICD-10-CM | POA: Diagnosis present

## 2018-05-22 DIAGNOSIS — E0821 Diabetes mellitus due to underlying condition with diabetic nephropathy: Secondary | ICD-10-CM | POA: Diagnosis not present

## 2018-05-22 DIAGNOSIS — I132 Hypertensive heart and chronic kidney disease with heart failure and with stage 5 chronic kidney disease, or end stage renal disease: Secondary | ICD-10-CM | POA: Diagnosis present

## 2018-05-22 DIAGNOSIS — R0609 Other forms of dyspnea: Secondary | ICD-10-CM | POA: Diagnosis not present

## 2018-05-22 DIAGNOSIS — Z8249 Family history of ischemic heart disease and other diseases of the circulatory system: Secondary | ICD-10-CM | POA: Diagnosis not present

## 2018-05-22 DIAGNOSIS — Z825 Family history of asthma and other chronic lower respiratory diseases: Secondary | ICD-10-CM | POA: Diagnosis not present

## 2018-05-22 DIAGNOSIS — I2510 Atherosclerotic heart disease of native coronary artery without angina pectoris: Secondary | ICD-10-CM | POA: Diagnosis present

## 2018-05-22 DIAGNOSIS — R778 Other specified abnormalities of plasma proteins: Secondary | ICD-10-CM

## 2018-05-22 DIAGNOSIS — Z9049 Acquired absence of other specified parts of digestive tract: Secondary | ICD-10-CM | POA: Diagnosis not present

## 2018-05-22 DIAGNOSIS — I509 Heart failure, unspecified: Secondary | ICD-10-CM

## 2018-05-22 DIAGNOSIS — R0600 Dyspnea, unspecified: Secondary | ICD-10-CM

## 2018-05-22 DIAGNOSIS — Z7982 Long term (current) use of aspirin: Secondary | ICD-10-CM | POA: Diagnosis not present

## 2018-05-22 DIAGNOSIS — E785 Hyperlipidemia, unspecified: Secondary | ICD-10-CM | POA: Diagnosis present

## 2018-05-22 DIAGNOSIS — I428 Other cardiomyopathies: Secondary | ICD-10-CM | POA: Diagnosis present

## 2018-05-22 DIAGNOSIS — D649 Anemia, unspecified: Secondary | ICD-10-CM | POA: Diagnosis present

## 2018-05-22 DIAGNOSIS — E1169 Type 2 diabetes mellitus with other specified complication: Secondary | ICD-10-CM | POA: Diagnosis present

## 2018-05-22 LAB — CBC WITH DIFFERENTIAL/PLATELET
Abs Immature Granulocytes: 0.02 10*3/uL (ref 0.00–0.07)
Basophils Absolute: 0.1 10*3/uL (ref 0.0–0.1)
Basophils Relative: 1 %
Eosinophils Absolute: 0.2 10*3/uL (ref 0.0–0.5)
Eosinophils Relative: 2 %
HCT: 28.6 % — ABNORMAL LOW (ref 39.0–52.0)
HEMOGLOBIN: 9 g/dL — AB (ref 13.0–17.0)
Immature Granulocytes: 0 %
LYMPHS ABS: 1.1 10*3/uL (ref 0.7–4.0)
Lymphocytes Relative: 12 %
MCH: 24.2 pg — ABNORMAL LOW (ref 26.0–34.0)
MCHC: 31.5 g/dL (ref 30.0–36.0)
MCV: 76.9 fL — ABNORMAL LOW (ref 80.0–100.0)
MONOS PCT: 11 %
Monocytes Absolute: 1.1 10*3/uL — ABNORMAL HIGH (ref 0.1–1.0)
Neutro Abs: 6.8 10*3/uL (ref 1.7–7.7)
Neutrophils Relative %: 74 %
Platelets: 276 10*3/uL (ref 150–400)
RBC: 3.72 MIL/uL — ABNORMAL LOW (ref 4.22–5.81)
RDW: 15.2 % (ref 11.5–15.5)
WBC: 9.3 10*3/uL (ref 4.0–10.5)
nRBC: 0 % (ref 0.0–0.2)

## 2018-05-22 LAB — BASIC METABOLIC PANEL
Anion gap: 8 (ref 5–15)
BUN: 50 mg/dL — AB (ref 6–20)
CO2: 22 mmol/L (ref 22–32)
Calcium: 8.1 mg/dL — ABNORMAL LOW (ref 8.9–10.3)
Chloride: 107 mmol/L (ref 98–111)
Creatinine, Ser: 4.73 mg/dL — ABNORMAL HIGH (ref 0.61–1.24)
GFR calc Af Amer: 15 mL/min — ABNORMAL LOW (ref >60)
GFR calc non Af Amer: 13 mL/min — ABNORMAL LOW (ref >60)
GLUCOSE: 90 mg/dL (ref 70–99)
Potassium: 3.9 mmol/L (ref 3.5–5.1)
Sodium: 137 mmol/L (ref 135–145)

## 2018-05-22 LAB — TROPONIN I: Troponin I: 0.08 ng/mL (ref <0.03)

## 2018-05-22 NOTE — ED Triage Notes (Signed)
Cough. Hx of sarcoidosis. Here today with SOB. No relief with inhalers.

## 2018-05-22 NOTE — ED Notes (Signed)
Pt complains of SOB that has been ongoing. Pt reports going to his PCP with an extensive workup. Pt reports his PCP recommeneded at CT scan.

## 2018-05-22 NOTE — ED Provider Notes (Signed)
[facility] EMERGENCY DEPARTMENT Provider Note   CSN: [id number] Arrival date & time: 05/22/18  1942     History   Chief Complaint No chief complaint on file.   HPI Brandon Carroll is a 58 y.o. male.  HPI Patient presents with shortness of breath.  Has had going for the last couple weeks.  Has had shortness of breath.  History of sarcoidosis.  Cough no real sputum production.  Does have history of CHF.  Saw PCP and had positive d-dimer.  Has had some swelling in his legs to.  Is diabetic.  BNP elevated yesterday also.  No chest pain.  Has had a cough that is been mostly dry. Past Medical History:  Diagnosis Date  . Acute systolic CHF (congestive heart failure), NYHA class 2 ([place]) 05/04/2015  . Acute-on-chronic kidney injury ([place]) 05/04/2015  . Diabetes mellitus   . Erectile dysfunction   . Hyperlipidemia   . Hypertension     Patient Active Problem List   Diagnosis Date Noted  . Viral URI with cough 05/08/2018  . Coronary artery disease 02/17/2018  . Acute gastroenteritis 01/28/2018  . Essential hypertension 07/03/2016  . Sarcoidosis 07/02/2016  . Low testosterone 10/04/2015  . Erectile dysfunction 06/21/2015  . CKD (chronic kidney disease) stage 4, GFR 15-29 ml/min ([facility]) 06/15/2015  . Chronic systolic CHF (congestive heart failure) ([place]) 06/15/2015  . Abnormal CT of the chest 06/08/2015  . 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 Anemia 06/06/2015  . Dyspnea   . Diabetes mellitus with renal complications ([place]) 48/18/5631  . Hyperlipidemia associated with type 2 diabetes mellitus ([place]) 09/18/2011    Past Surgical History:  Procedure Laterality Date  . CARDIAC CATHETERIZATION N/A 06/15/2015   Procedure: Left Heart Cath and Coronary Angiography;  Surgeon: Peter M Martinique, MD;  Location: [facility];  Service: Cardiovascular;  Laterality: N/A;  . CHOLECYSTECTOMY N/A 06/28/2017   Procedure: LAPAROSCOPIC CHOLECYSTECTOMY;  Surgeon: Ralene Ok, MD;   Location: [facility];  Service: General;  Laterality: N/A;  . HERNIA REPAIR  7th grade   umbilical        Home Medications    Prior to Admission medications   Medication Sig Start Date End Date Taking? Authorizing Provider  Albuterol Sulfate (PROAIR RESPICLICK) 497 (90 Base) MCG/ACT AEPB Inhale 2 puffs into the lungs every 6 (six) hours as needed. 01/31/18   Marrian Salvage, FNP  amLODipine (NORVASC) 5 MG tablet Take 1 tablet (5 mg total) by mouth daily. 05/02/18   Park Liter, MD  aspirin EC 81 MG tablet Take 81 mg by mouth daily.    [provider]  BD PEN NEEDLE NANO U/F 32G X 4 MM MISC use once daily as directed 02/25/17   Philemon Kingdom, MD  carvedilol (COREG) 6.25 MG tablet Take 1 tablet (6.25 mg total) by mouth 2 (two) times daily. 05/02/18   Park Liter, MD  cloNIDine (CATAPRES - DOSED IN MG/24 HR) 0.3 mg/24hr patch Place 1 patch (0.3 mg total) onto the skin once a week. 03/11/18   Hoyt Koch, MD  Continuous Blood Gluc Receiver (FREESTYLE LIBRE 14 DAY READER) DEVI USE AS DIRECTED 11/24/17   Renato Shin, MD  fluticasone furoate-vilanterol (BREO ELLIPTA) 100-25 MCG/INH AEPB Inhale 1 puff into the lungs daily. 01/31/18   Marrian Salvage, FNP  furosemide (LASIX) 40 MG tablet Take 1 tablet (40 mg total) by mouth 2 (two) times daily. 05/20/18   Biagio Borg, MD  glucose blood [facility]  VERIO) test strip 1 each by Other route 2 (two) times daily as needed for other. And lancets 2/day 02/08/16   Renato Shin, MD  hydrALAZINE (APRESOLINE) 10 MG tablet Take 1 tablet (10 mg total) by mouth 3 (three) times daily. Patient taking differently: Take 25 mg by mouth 3 (three) times daily.  02/17/18   Park Liter, MD  Insulin Glargine (LANTUS SOLOSTAR) 100 UNIT/ML Solostar Pen Inject 60 Units into the skin every morning. And pen needles 1/day 09/23/17   Renato Shin, MD  isosorbide mononitrate (IMDUR) 30 MG 24 hr tablet Take 1 tablet (30 mg total)  by mouth daily. 05/02/18 07/31/18  Park Liter, MD  predniSONE (DELTASONE) 20 MG tablet Take 2 tablets (40 mg total) by mouth daily with breakfast. 05/08/18   Hoyt Koch, MD  rosuvastatin (CRESTOR) 20 MG tablet Take 2 tablets (40 mg total) by mouth daily. 02/17/18   Park Liter, MD  Spacer/Aero-Holding Chambers (AEROCHAMBER MV) inhaler Use as instructed 07/20/15   Magdalen Spatz, NP    Family History Family History  Problem Relation Age of Onset  . Arthritis Mother   . COPD Mother   . Diabetes Mother        was thin, took insulin  . Hypertension Mother   . Hypertension Father   . Arthritis Sister        rheumatoid  . COPD Sister   . Diabetes Brother   . Colon cancer Neg Hx     Social History Social History   Tobacco Use  . Smoking status: Never Smoker  . Smokeless tobacco: Never Used  Substance Use Topics  . Alcohol use: No    Alcohol/week: 0.0 standard drinks  . Drug use: No     Allergies   Lipitor [atorvastatin]   Review of Systems Review of Systems  Constitutional: Negative for appetite change.  HENT: Negative for congestion.   Respiratory: Positive for cough and shortness of breath.   Cardiovascular: Positive for leg swelling. Negative for chest pain.  Gastrointestinal: Negative for abdominal pain.  Genitourinary: Negative for flank pain.  Musculoskeletal: Negative for back pain.  Skin: Negative for rash.  Neurological: Negative for weakness.  Psychiatric/Behavioral: Negative for confusion.     Physical Exam Updated Vital Signs BP (!) 176/106   Pulse 76   Temp 97.9 F (36.6 C) (Oral)   Resp 16   Ht 5\' 10"  (1.778 m)   Wt 89.8 kg   SpO2 93%   BMI 28.41 kg/m   Physical Exam Vitals signs and nursing note reviewed.  HENT:     Head: Normocephalic.  Eyes:     Extraocular Movements: Extraocular movements intact.  Cardiovascular:     Comments: Mildly harsh breath sounds without focal rales. Pulmonary:     Effort: Pulmonary  effort is normal.  Abdominal:     General: Abdomen is flat. There is no distension.  Musculoskeletal:     Right lower leg: Edema present.     Left lower leg: Edema present.  Skin:    General: Skin is warm.     Capillary Refill: Capillary refill takes less than 2 seconds.  Neurological:     General: No focal deficit present.     Mental Status: He is alert.      ED Treatments / Results  Labs (all labs ordered are listed, but only abnormal results are displayed) Labs Reviewed  BASIC METABOLIC PANEL - Abnormal; Notable for the following components:  Result Value   BUN 50 (*)    Creatinine, Ser 4.73 (*)    Calcium 8.1 (*)    GFR calc non Af Amer 13 (*)    GFR calc Af Amer 15 (*)    All other components within normal limits  TROPONIN I - Abnormal; Notable for the following components:   Troponin I 0.08 (*)    All other components within normal limits  CBC WITH DIFFERENTIAL/PLATELET - Abnormal; Notable for the following components:   RBC 3.72 (*)    Hemoglobin 9.0 (*)    HCT 28.6 (*)    MCV 76.9 (*)    MCH 24.2 (*)    Monocytes Absolute 1.1 (*)    All other components within normal limits  OCCULT BLOOD X 1 CARD TO LAB, STOOL    EKG None  Radiology Dg Chest 2 View  Result Date: 05/22/2018 CLINICAL DATA:  Shortness of breath for 2 weeks EXAM: CHEST - 2 VIEW COMPARISON:  05/08/2018 FINDINGS: Small bilateral pleural effusions. Borderline cardiomegaly with central vascular congestion and patchy perihilar opacities suspect for mild edema. No pneumothorax. Degenerative changes of the spine. IMPRESSION: Small bilateral pleural effusions. Borderline cardiomegaly with vascular congestion and mild perihilar edema. Electronically Signed   By: Donavan Foil M.D.   On: 05/22/2018 20:53    Procedures Procedures (including critical care time)  Medications Ordered in ED Medications - No data to display   Initial Impression / Assessment and Plan / ED Course  I have reviewed the  triage vital signs and the nursing notes.  Pertinent labs & imaging results that were available during my care of the patient were reviewed by me and considered in my medical decision making (see chart for details).     Patient with shortness of breath and cough.  Does have hypertension.  Worsening renal function.  History of CHF.  BNP elevated but also has mildly elevated troponin.  Cannot get CT angiography because of elevated kidney function.  Will require admission to the hospital.  Also has anemia however.  Will check Hemoccult before starting any anticoagulation.  Will require admission to hospital  Final Clinical Impressions(s) / ED Diagnoses   Final diagnoses:  AKI (acute kidney injury) ([place])  Elevated troponin  Dyspnea, unspecified type    ED Discharge Orders    None       Davonna Belling, MD 05/22/18 2139

## 2018-05-22 NOTE — Telephone Encounter (Signed)
Ok for v/q scan - order done

## 2018-05-22 NOTE — ED Notes (Signed)
CRITICAL VALUE ALERT  Critical Value: Troponin 0.08 Date & Time Notied:  05/22/2018 @ 2100 Provider Notified: Alvino Chapel

## 2018-05-22 NOTE — Telephone Encounter (Signed)
Pt stated that he was suppose to have a CT scan but it was then cancelled. What step does he need to take due to his elevated D-Dimer? Please advise.

## 2018-05-22 NOTE — ED Notes (Signed)
Pt ambulated without assistance, SpO2 93-94, Heart rate 83-84

## 2018-05-22 NOTE — Progress Notes (Signed)
58 y.o. male with history of chronic systolic heart failure and stage IV chronic kidney disease has been accepted for transfer to [facility] from [facility] for additional evaluation and management of acute on chronic systolic heart failure and AKI superimposed on stage IV CKD after presenting to [facility] ED c/o SOB.   Babs Bertin, DO Hospitalist

## 2018-05-22 NOTE — ED Notes (Signed)
C/o SOB for "weeks". O2 sats 98% on RA and HR 89 checked by nursefirst

## 2018-05-22 NOTE — Addendum Note (Signed)
Addended by: Biagio Borg on: 05/22/2018 05:18 PM   Modules accepted: Orders

## 2018-05-23 ENCOUNTER — Inpatient Hospital Stay (HOSPITAL_COMMUNITY): Payer: Managed Care, Other (non HMO)

## 2018-05-23 ENCOUNTER — Other Ambulatory Visit: Payer: Self-pay | Admitting: Internal Medicine

## 2018-05-23 DIAGNOSIS — I160 Hypertensive urgency: Secondary | ICD-10-CM

## 2018-05-23 DIAGNOSIS — N184 Chronic kidney disease, stage 4 (severe): Secondary | ICD-10-CM

## 2018-05-23 DIAGNOSIS — R7989 Other specified abnormal findings of blood chemistry: Secondary | ICD-10-CM | POA: Diagnosis present

## 2018-05-23 DIAGNOSIS — N179 Acute kidney failure, unspecified: Secondary | ICD-10-CM | POA: Diagnosis present

## 2018-05-23 DIAGNOSIS — I509 Heart failure, unspecified: Secondary | ICD-10-CM

## 2018-05-23 DIAGNOSIS — R0609 Other forms of dyspnea: Secondary | ICD-10-CM

## 2018-05-23 LAB — HEMOGLOBIN A1C
Hgb A1c MFr Bld: 6.9 % — ABNORMAL HIGH (ref 4.8–5.6)
Mean Plasma Glucose: 151.33 mg/dL

## 2018-05-23 LAB — TROPONIN I: Troponin I: 0.07 ng/mL (ref <0.03)

## 2018-05-23 LAB — CBG MONITORING, ED
Glucose-Capillary: 58 mg/dL — ABNORMAL LOW (ref 70–99)
Glucose-Capillary: 126 mg/dL — ABNORMAL HIGH (ref 70–99)

## 2018-05-23 LAB — BASIC METABOLIC PANEL
Anion gap: 8 (ref 5–15)
BUN: 44 mg/dL — ABNORMAL HIGH (ref 6–20)
CALCIUM: 8.2 mg/dL — AB (ref 8.9–10.3)
CO2: 22 mmol/L (ref 22–32)
Chloride: 108 mmol/L (ref 98–111)
Creatinine, Ser: 4.57 mg/dL — ABNORMAL HIGH (ref 0.61–1.24)
GFR calc non Af Amer: 13 mL/min — ABNORMAL LOW (ref >60)
GFR, EST AFRICAN AMERICAN: 16 mL/min — AB (ref >60)
Glucose, Bld: 80 mg/dL (ref 70–99)
Potassium: 3.8 mmol/L (ref 3.5–5.1)
Sodium: 138 mmol/L (ref 135–145)

## 2018-05-23 LAB — GLUCOSE, CAPILLARY
GLUCOSE-CAPILLARY: 91 mg/dL (ref 70–99)
Glucose-Capillary: 86 mg/dL (ref 70–99)
Glucose-Capillary: 83 mg/dL (ref 70–99)

## 2018-05-23 LAB — ECHOCARDIOGRAM COMPLETE
Height: 70 in
Weight: 2984.15 oz

## 2018-05-23 LAB — HEPARIN LEVEL (UNFRACTIONATED): Heparin Unfractionated: 0.43 IU/mL (ref 0.30–0.70)

## 2018-05-23 LAB — BRAIN NATRIURETIC PEPTIDE: B Natriuretic Peptide: 1320.5 pg/mL — ABNORMAL HIGH (ref 0.0–100.0)

## 2018-05-23 MED ORDER — POLYETHYLENE GLYCOL 3350 17 G PO PACK
17.0000 g | PACK | Freq: Every day | ORAL | Status: DC | PRN
  Administered 2018-05-26: 17 g via ORAL
  Filled 2018-05-23: qty 1

## 2018-05-23 MED ORDER — INSULIN ASPART 100 UNIT/ML ~~LOC~~ SOLN
0.0000 [IU] | Freq: Three times a day (TID) | SUBCUTANEOUS | Status: DC
  Administered 2018-05-24 (×2): 1 [IU] via SUBCUTANEOUS
  Administered 2018-05-25 (×3): 2 [IU] via SUBCUTANEOUS
  Administered 2018-05-26: 7 [IU] via SUBCUTANEOUS
  Administered 2018-05-27: 3 [IU] via SUBCUTANEOUS

## 2018-05-23 MED ORDER — ACETAMINOPHEN 325 MG PO TABS
650.0000 mg | ORAL_TABLET | Freq: Four times a day (QID) | ORAL | Status: DC | PRN
  Administered 2018-05-24: 650 mg via ORAL
  Filled 2018-05-23: qty 2

## 2018-05-23 MED ORDER — FUROSEMIDE 10 MG/ML IJ SOLN
80.0000 mg | Freq: Two times a day (BID) | INTRAMUSCULAR | Status: DC
  Administered 2018-05-23 – 2018-05-26 (×6): 80 mg via INTRAVENOUS
  Filled 2018-05-23 (×6): qty 8

## 2018-05-23 MED ORDER — ONDANSETRON HCL 4 MG/2ML IJ SOLN: 4.0000 mg | Freq: Four times a day (QID) | INTRAMUSCULAR | Status: DC | PRN

## 2018-05-23 MED ORDER — ONDANSETRON HCL 4 MG PO TABS: 4.0000 mg | ORAL_TABLET | Freq: Four times a day (QID) | ORAL | Status: DC | PRN

## 2018-05-23 MED ORDER — LISINOPRIL 5 MG PO TABS
2.5000 mg | ORAL_TABLET | Freq: Every day | ORAL | Status: DC
  Administered 2018-05-23 – 2018-05-24 (×2): 2.5 mg via ORAL
  Filled 2018-05-23 (×2): qty 1

## 2018-05-23 MED ORDER — SODIUM CHLORIDE 0.9% FLUSH
3.0000 mL | Freq: Two times a day (BID) | INTRAVENOUS | Status: DC
  Administered 2018-05-23 – 2018-05-24 (×2): 3 mL via INTRAVENOUS

## 2018-05-23 MED ORDER — TECHNETIUM TC 99M DIETHYLENETRIAME-PENTAACETIC ACID
30.6000 | Freq: Once | INTRAVENOUS | Status: AC | PRN
  Administered 2018-05-23: 30.6 via RESPIRATORY_TRACT

## 2018-05-23 MED ORDER — SODIUM CHLORIDE 0.9 % IV SOLN: 250.0000 mL | INTRAVENOUS | Status: DC | PRN

## 2018-05-23 MED ORDER — SODIUM CHLORIDE 0.9% FLUSH: 3.0000 mL | INTRAVENOUS | Status: DC | PRN

## 2018-05-23 MED ORDER — INSULIN ASPART 100 UNIT/ML ~~LOC~~ SOLN: 0.0000 [IU] | Freq: Three times a day (TID) | SUBCUTANEOUS | Status: DC

## 2018-05-23 MED ORDER — HYDRALAZINE HCL 20 MG/ML IJ SOLN
20.0000 mg | Freq: Four times a day (QID) | INTRAMUSCULAR | Status: DC | PRN
  Administered 2018-05-23: 20 mg via INTRAVENOUS
  Filled 2018-05-23: qty 1

## 2018-05-23 MED ORDER — TECHNETIUM TO 99M ALBUMIN AGGREGATED
4.2300 | Freq: Once | INTRAVENOUS | Status: AC | PRN
  Administered 2018-05-23: 4.23 via INTRAVENOUS

## 2018-05-23 MED ORDER — ACETAMINOPHEN 650 MG RE SUPP: 650.0000 mg | Freq: Four times a day (QID) | RECTAL | Status: DC | PRN

## 2018-05-23 MED ORDER — SODIUM CHLORIDE 0.9% FLUSH
3.0000 mL | Freq: Two times a day (BID) | INTRAVENOUS | Status: DC
  Administered 2018-05-24 – 2018-05-26 (×5): 3 mL via INTRAVENOUS

## 2018-05-23 MED ORDER — HYDRALAZINE HCL 20 MG/ML IJ SOLN
20.0000 mg | Freq: Once | INTRAMUSCULAR | Status: AC
  Administered 2018-05-23: 20 mg via INTRAVENOUS
  Filled 2018-05-23: qty 1

## 2018-05-23 MED ORDER — HEPARIN BOLUS VIA INFUSION
4000.0000 [IU] | Freq: Once | INTRAVENOUS | Status: AC
  Administered 2018-05-23: 4000 [IU] via INTRAVENOUS
  Filled 2018-05-23: qty 4000

## 2018-05-23 MED ORDER — HEPARIN (PORCINE) 25000 UT/250ML-% IV SOLN
1350.0000 [IU]/h | INTRAVENOUS | Status: DC
  Administered 2018-05-23: 1350 [IU]/h via INTRAVENOUS
  Filled 2018-05-23 (×2): qty 250

## 2018-05-23 NOTE — ED Notes (Signed)
Per VO Dr. Rogene Houston, pt is taking his home medications at this time: Hydralazine 50mg  po, Isosorbide 30mg  po, Amlodipine 5mg  po, Carvedilol 6.25mg  po, and Lasix 40mg  po.

## 2018-05-23 NOTE — ED Notes (Signed)
While sleeping, pt's oxygen saturations had dropped to 86% with a good pleth. Placed pt on 3 liters of oxygen via nasal cannula.

## 2018-05-23 NOTE — Progress Notes (Signed)
Patient's BP is still 185/97-  Gave PRN hydralazine for SBP >170

## 2018-05-23 NOTE — Progress Notes (Signed)
ANTICOAGULATION CONSULT NOTE - Initial Consult  Pharmacy Consult for Heparin Indication: pulmonary embolus  Allergies  Allergen Reactions  . Lipitor [Atorvastatin] Hives and Itching    Patient Measurements: Height: 5\' 10"  (177.8 cm) Weight: 186 lb 8.2 oz (84.6 kg) IBW/kg (Calculated) : 73  Vital Signs: Temp: 97.8 F (36.6 C) (01/10 1254) Temp Source: Oral (01/10 1254) BP: 220/102 (01/10 1254) Pulse Rate: 78 (01/10 1254)  Labs: Recent Labs    05/20/18 1709 05/22/18 2016 05/23/18 0753  HGB 9.8* 9.0*  --   HCT 29.9* 28.6*  --   PLT 276.0 276  --   CREATININE 4.35* 4.73* 4.57*  TROPONINI  --  0.08* 0.07*    Estimated Creatinine Clearance: 18.9 mL/min (A) (by C-G formula based on SCr of 4.57 mg/dL (H)).   Medical History: Past Medical History:  Diagnosis Date  . Acute systolic CHF (congestive heart failure), NYHA class 2 ([place]) 05/04/2015  . Acute-on-chronic kidney injury ([place]) 05/04/2015  . Diabetes mellitus   . Erectile dysfunction   . Hyperlipidemia   . Hypertension    Assessment: 58 yo male with shortness of breath. Suspected PE. Pharmacy consulted to initiate heparin drip for PE. Patient was not on [facility] PTA.   Goal of Therapy:  Heparin level 0.3-0.7 units/ml Monitor platelets by anticoagulation protocol: Yes   Plan:  Give 4000 units bolus x 1  Start Heparin infusion at 1350 units/hr F/u heparin level in 8 hours Daily Heparin level and CBC   Alanda Slim, PharmD, [facility] Clinical Pharmacist Please see AMION for all Pharmacists' Contact Phone Numbers 05/23/2018, 2:39 PM

## 2018-05-23 NOTE — H&P (Signed)
History and Physical    Brandon Carroll QQP:[id number] DOB: Mar 29, 1961 DOA: 05/22/2018  PCP: Hoyt Koch, MD   Patient coming from: Home via [place] have personally briefly reviewed patient's old medical records in [place]  Chief Complaint: Dyspnea and worsening shortness of breath for about a month  HPI: Brandon Carroll is a 58 y.o. male with medical history significant of sarcoidosis, abnormal ejection fraction on nuclear cardiac scan done by Dr. Agustin Cree in [place], acute on chronic kidney injury, diabetes mellitus type 2, hyperlipidemia, diastolic congestive heart failure, hypertension who was seen by his primary care physician and a d-dimer was obtained and was positive.  He had some swelling in his legs and is diabetic.  BNP has been elevated as well.  He denies any fevers chills nausea or vomiting.  Had worsening renal function and in the past little while he has developed acute renal failure on chronic kidney disease stage IV.  He is being admitted to the hospital for further evaluation and management of cardiac issues and renal function. Echocardiogram obtained today showed a normal ejection fraction and therefore cardiology consult which was initially planned has been canceled.  Appears that most of his volume may be renally mediated.  Dr. Augustin Coupe from nephrology will be seeing the patient.  I am starting him on IV Lasix twice daily.  Review of Systems: As per HPI otherwise all other systems reviewed and  negative.    Past Medical History:  Diagnosis Date  . Acute systolic CHF (congestive heart failure), NYHA class 2 ([place]) 05/04/2015  . Acute-on-chronic kidney injury ([place]) 05/04/2015  . Diabetes mellitus   . Erectile dysfunction   . Hyperlipidemia   . Hypertension     Past Surgical History:  Procedure Laterality Date  . CARDIAC CATHETERIZATION N/A 06/15/2015   Procedure: Left Heart Cath and Coronary Angiography;  Surgeon: Peter M Martinique,  MD;  Location: [facility];  Service: Cardiovascular;  Laterality: N/A;  . CHOLECYSTECTOMY N/A 06/28/2017   Procedure: LAPAROSCOPIC CHOLECYSTECTOMY;  Surgeon: Ralene Ok, MD;  Location: [facility];  Service: General;  Laterality: N/A;  . HERNIA REPAIR  7th grade   umbilical    Social History   Social History Narrative   Divorced. Education: [employer].    Unable to exercise due to significant SOB and DOE.    Lives alone.   Son lives in [place].     reports that he has never smoked. He has never used smokeless tobacco. He reports that he does not drink alcohol or use drugs.  Allergies  Allergen Reactions  . Lipitor [Atorvastatin] Hives and Itching    Family History  Problem Relation Age of Onset  . Arthritis Mother   . COPD Mother   . Diabetes Mother        was thin, took insulin  . Hypertension Mother   . Hypertension Father   . Arthritis Sister        rheumatoid  . COPD Sister   . Diabetes Brother   . Colon cancer Neg Hx     Prior to Admission medications   Medication Sig Start Date End Date Taking? Authorizing Provider  amLODipine (NORVASC) 5 MG tablet Take 1 tablet (5 mg total) by mouth daily. 05/02/18  Yes Park Liter, MD  aspirin EC 81 MG tablet Take 81 mg by mouth daily.   Yes [provider]  carvedilol (COREG) 6.25 MG tablet Take 1 tablet (6.25 mg total) by mouth  2 (two) times daily. 05/02/18  Yes Park Liter, MD  isosorbide mononitrate (IMDUR) 30 MG 24 hr tablet Take 1 tablet (30 mg total) by mouth daily. 05/02/18 07/31/18 Yes Park Liter, MD  rosuvastatin (CRESTOR) 20 MG tablet Take 2 tablets (40 mg total) by mouth daily. 02/17/18  Yes Park Liter, MD  Albuterol Sulfate (PROAIR RESPICLICK) 170 (90 Base) MCG/ACT AEPB Inhale 2 puffs into the lungs every 6 (six) hours as needed. 01/31/18   Marrian Salvage, FNP  BD PEN NEEDLE NANO U/F 32G X 4 MM MISC USE ONCE DAILY AS DIRECTED 05/23/18   Renato Shin, MD  cloNIDine  (CATAPRES - DOSED IN MG/24 HR) 0.3 mg/24hr patch Place 1 patch (0.3 mg total) onto the skin once a week. 03/11/18   Hoyt Koch, MD  Continuous Blood Gluc Receiver (FREESTYLE LIBRE 14 DAY READER) DEVI USE AS DIRECTED 11/24/17   Renato Shin, MD  fluticasone furoate-vilanterol (BREO ELLIPTA) 100-25 MCG/INH AEPB Inhale 1 puff into the lungs daily. 01/31/18   Marrian Salvage, FNP  furosemide (LASIX) 40 MG tablet Take 1 tablet (40 mg total) by mouth 2 (two) times daily. 05/20/18   Biagio Borg, MD  glucose blood [facility] VERIO) test strip 1 each by Other route 2 (two) times daily as needed for other. And lancets 2/day 02/08/16   Renato Shin, MD  hydrALAZINE (APRESOLINE) 10 MG tablet Take 1 tablet (10 mg total) by mouth 3 (three) times daily. Patient taking differently: Take 50 mg by mouth 3 (three) times daily.  02/17/18   Park Liter, MD  Insulin Glargine (LANTUS SOLOSTAR) 100 UNIT/ML Solostar Pen Inject 60 Units into the skin every morning. And pen needles 1/day 09/23/17   Renato Shin, MD  predniSONE (DELTASONE) 20 MG tablet Take 2 tablets (40 mg total) by mouth daily with breakfast. 05/08/18   Hoyt Koch, MD  Spacer/Aero-Holding Josiah Lobo (AEROCHAMBER MV) inhaler Use as instructed 07/20/15   Magdalen Spatz, NP    Physical Exam:  Constitutional: NAD, calm, comfortable Vitals:   05/23/18 1030 05/23/18 1100 05/23/18 1107 05/23/18 1254  BP: (!) 121/95 (!) 174/92 (!) 170/94 (!) 220/102  Pulse:  69 73 78  Resp: 17 15 20    Temp:  01.7 F (36.8 C)  97.8 F (36.6 C)  TempSrc:  Oral  Oral  SpO2:  97% 94% 99%  Weight:    84.6 kg  Height:    5\' 10"  (1.778 m)   Eyes: PERRL, lids and conjunctivae normal ENMT: Mucous membranes are moist. Posterior pharynx clear of any exudate or lesions.Normal dentition.  Neck: normal, supple, no masses, no thyromegaly Respiratory: clear to auscultation bilaterally, no wheezing, no crackles. Normal respiratory effort. No accessory  muscle use.  Cardiovascular: Regular rate and rhythm, no murmurs / rubs / gallops.  2+ extremity edema.  Doughy edema of the hips, 2+ pedal pulses. No carotid bruits.  Abdomen: no tenderness, no masses palpated. No hepatosplenomegaly. Bowel sounds positive.  Musculoskeletal: no clubbing / cyanosis. No joint deformity upper and lower extremities. Good ROM, no contractures. Normal muscle tone.  Skin: no rashes, lesions, ulcers. No induration Neurologic: CN 2-12 grossly intact. Sensation intact, DTR normal. Strength 5/5 in all 4.  Psychiatric: Normal judgment and insight. Alert and oriented x 3. Normal mood.    Labs on Admission: I have personally reviewed following labs and imaging studies  CBC: Recent Labs  Lab 05/20/18 1709 05/22/18 2016  WBC 9.3 9.3  NEUTROABS 6.8 6.8  HGB 9.8* 9.0*  HCT 29.9* 28.6*  MCV 76.1* 76.9*  PLT 276.0 833   Basic Metabolic Panel: Recent Labs  Lab 05/20/18 1709 05/22/18 2016 05/23/18 0753  NA 138 137 138  K 4.5 3.9 3.8  CL 107 107 108  CO2 22 22 22   GLUCOSE 71 90 80  BUN 45* 50* 44*  CREATININE 4.35* 4.73* 4.57*  CALCIUM 8.4 8.1* 8.2*   GFR: Estimated Creatinine Clearance: 18.9 mL/min (A) (by C-G formula based on SCr of 4.57 mg/dL (H)). Cardiac Enzymes: Recent Labs  Lab 05/22/18 2016 05/23/18 0753  TROPONINI 0.08* 0.07*   BNP (last 3 results) Recent Labs    02/07/18 1601 05/20/18 1709  PROBNP 478.0* 1,460.0*   HbA1C: Recent Labs    05/20/18 1709  HGBA1C 6.6*   CBG: Recent Labs  Lab 05/23/18 0733 05/23/18 1012 05/23/18 1251  GLUCAP 58* 126* 86   Thyroid Function Tests: Recent Labs    05/20/18 1709  TSH 1.71   Urine analysis:    Component Value Date/Time   COLORURINE YELLOW 05/02/2015 1938   APPEARANCEUR Clear 03/21/2017 1124   LABSPEC 1.012 05/02/2015 1938   PHURINE 5.0 05/02/2015 1938   GLUCOSEU 1+ (A) 03/21/2017 1124   HGBUR MODERATE (A) 05/02/2015 1938   BILIRUBINUR Negative 03/21/2017 1124   KETONESUR  NEGATIVE 05/02/2015 1938   PROTEINUR 3+ (A) 03/21/2017 1124   PROTEINUR >300 (A) 05/02/2015 1938   UROBILINOGEN 0.2 08/20/2011 1801   UROBILINOGEN 1.0 09/11/2008 1120   NITRITE Negative 03/21/2017 1124   NITRITE NEGATIVE 05/02/2015 1938   LEUKOCYTESUR Negative 03/21/2017 1124    Radiological Exams on Admission: Dg Chest 2 View  Result Date: 05/22/2018 CLINICAL DATA:  Shortness of breath for 2 weeks EXAM: CHEST - 2 VIEW COMPARISON:  05/08/2018 FINDINGS: Small bilateral pleural effusions. Borderline cardiomegaly with central vascular congestion and patchy perihilar opacities suspect for mild edema. No pneumothorax. Degenerative changes of the spine. IMPRESSION: Small bilateral pleural effusions. Borderline cardiomegaly with vascular congestion and mild perihilar edema. Electronically Signed   By: Donavan Foil M.D.   On: 05/22/2018 20:53    EKG: Independently reviewed.,  Sinus rhythm, left axis deviation, left atrial enlargement  Assessment/Plan Principal Problem:   Acute renal failure superimposed on stage 4 chronic kidney disease (HCC) Active Problems:   Dyspnea   Acute on chronic systolic heart failure (HCC)   Positive D dimer   Hypertensive urgency   Diabetes mellitus with renal complications (HCC)   CKD (chronic kidney disease) stage 4, GFR 15-29 ml/min (HCC)   Sarcoidosis   Hyperlipidemia associated with type 2 diabetes mellitus (HCC)   Anemia  1.  Acute renal failure superimposed on stage IV chronic kidney disease: We will start patient on high-dose Lasix 80 mg every 12 hours and monitor urine output very closely.  Hopefully his pleural effusions will improve as he is he appears to be very volume overloaded.  2.  Dyspnea: Secondary to pleural effusions and renally mediated heart failure.  Continue with diuresis.  3.  Acute on chronic systolic heart failure: Echocardiogram obtained results preliminarily showed normal ejection fraction full result pending  4.  Positive  d-dimer: Start heparin drip obtain VQ scan.  Cannot get CTA due to renal failure.  5.  Hypertensive urgency: Hydralazine PRN for systolic blood pressure greater than 180.  Resume home medications.  Monitor blood pressures closely.  6.  Diabetes mellitus with renal complications: Sliding scale insulin coverage as needed.  Continue home medications hold metformin  7.  Chronic kidney disease stage IV with a GFR 15-29: Noted nephrology has been consulted.  Patient may be getting close to requiring dialysis.  7.  Sarcoidosis: Noted does not appear to be active at this time.  8.  Hyperlipidemia associated with type 2 diabetes mellitus: To new home statin.  9.  Anemia: Likley related to end-stage renal disease with poor erythropoietin production  DVT prophylaxis: Full dose heparin Code Status: Full code Family Communication: Spoke with patient's sister and girlfriend who are at the bedside later during the meeting patient's brother came in Disposition Plan: Likely home in 2 to 3 days Consults called: Nephrology, initially cardiology then canceled once echo obtained Admission status: Inpatient   Lady Deutscher MD [facility] Pager [phone]  If 7PM-7AM, please contact night-coverage [email or web address] Password [facility]  05/23/2018, 2:31 PM

## 2018-05-23 NOTE — ED Notes (Signed)
Pt given sugar-free oatmeal, 4oz apple juice, water and trail mix.

## 2018-05-23 NOTE — ED Provider Notes (Signed)
Patient still waiting for bed telemetry bed at [facility] for admission for exacerbation of CHF.  Patient known diabetic.  Actually blood sugars were low this morning probably because anything to eat.  We will hold his insulin for now but will monitor the blood sugars.  Patient given all of his other home meds.  Which includes his blood pressure medicine and a diuretic.  Patient repeat troponin slightly lower than it was yesterday clinically significant that is not higher which would be suggestive of an acute ischemic event.  Patient's BUN and creatinine also slightly improved.   Fredia Sorrow, MD 05/23/18 [phone]

## 2018-05-23 NOTE — Telephone Encounter (Signed)
Called pt, LVM   Pt needs to be informed since he can not have a CT scan that Dr. Jenny Reichmann has ordered a different type for him to be scheduled for.

## 2018-05-23 NOTE — Plan of Care (Signed)
Nutrition Education Note  RD consulted for nutrition education regarding CHF and diabetes.  Lab Results  Component Value Date   HGBA1C 6.6 (H) 05/20/2018   PTA DM medications are 60 units insulin aspart daily.   Spoke with pt and family members at bedside. Pt has made significant lifestyle change within the past several months. Pt consumes 2-3 meals and one snack daily (Breakfast: oatmeal; Lunch: [place] sandwich, banana, and chips; Dinner: meat, starch, and vegetable). Pt wife very knowledgeable and prepares most of the meals. Pt has been working hard on glycemic control- Hgb A1c historically 13 and has worked hard to get it below 7. Reviewed sodium and DM diet with pt to help best preserve kidney function. Pt with no electrolyte abnormalities; discussed with RD renal specialist and does not think that pt needs a renal diet at this time, however, will defer to nephrology consultation. Case discussed with RN.   RD provided "Heart Healthy, Consistent Carbohydrate Nutrition Therapy" handout from the Academy of [employer] and Dietetics. Reviewed patient's dietary recall. Provided examples on ways to decrease sodium intake in diet. Discouraged intake of processed foods and use of salt shaker. Encouraged fresh fruits and vegetables as well as whole grain sources of carbohydrates to maximize fiber intake.   RD discussed why it is important for patient to adhere to diet recommendations, and emphasized the role of fluids, foods to avoid, and importance of weighing self daily.   Discussed different food groups and their effects on blood sugar, emphasizing carbohydrate-containing foods. Provided list of carbohydrates and recommended serving sizes of common foods.  Discussed importance of controlled and consistent carbohydrate intake throughout the day. Provided examples of ways to balance meals/snacks and encouraged intake of high-fiber, whole grain complex carbohydrates. Teach back method used.  Expect good  compliance.  Body mass index is 26.76 kg/m. Pt meets criteria for overweight based on current BMI.  Current diet order is renal/ carb modified with 1200 ml fluid restriction, patient is consuming approximately 100% of meals at this time. Labs and medications reviewed. No further nutrition interventions warranted at this time. RD contact information provided. If additional nutrition issues arise, please re-consult RD.   Theo Krumholz A. Jimmye Norman, RD, LDN, CDE Pager: [phone] After hours Pager: [phone]

## 2018-05-23 NOTE — Progress Notes (Signed)
Patient with a stage IV CKD, known diastolic heart failure, known EF of 55% presented to med center with worsening shortness of breath.  Was accepted at [facility], needs to come to the [facility].  Will need nephrology consultation.

## 2018-05-23 NOTE — Progress Notes (Signed)
*  PRELIMINARY RESULTS* Echocardiogram 2D Echocardiogram has been performed.  Brandon Carroll 05/23/2018, 4:03 PM

## 2018-05-23 NOTE — Progress Notes (Addendum)
Patient's SBP was 220, paged MD MD gave verbal order for hydralazine IV 20 mg one time until she can see him.      1320 - BP 185/92-  MD aware- ok with that at this time.

## 2018-05-23 NOTE — Consult Note (Addendum)
Reason for Consult: Acute on chronic renal failure Referring Physician: Dr. Randa Spike  Chief Complaint:  Dyspnea  Assessment/Plan: 1. 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 He may have an acute component exacerbated by the elevated BP. - Will r/o obstruction with an ultrasound (less likely). - Restrict the left arm as he is right arm dominant and will ask [facility] to see him in the AM. - Vein mapping - Agree with IV diuresis; I don't think he will need HD this admission but would like a permanent access placed if at all possible. 2. Hypertensive urgency - ? Compliance, restart all home medications. Goal decrease ~30% in 1st 24hrs to maintain adequate cerebral perfusion. 3. DM 4. DDimer - VQ scan per primary 5. Sarcoidosis 6. Anemia - will check iron panels    HPI: 58 y/o man with a history of DM HTN CHF HLD CKD IV sarcoidosis presenting with dyspnea. Seen by PCP recently with a positive d-dimer and lower ext swelling, noted to have an elevated BNP. He is presenting with worsening dyspnea + increased lower extremity edema over the past week but denies cp, PND, n/v/d. He is seen for his advanced CKD by Dr. Hollie Salk with [facility] and has been advised to see [facility] for a fistula and permanent access placement given the progression in his CKD. He is on Lasix 40mg  BID at home. He denies NSAID use and only uses Tylenol for pain. He also denies any obstructive like symptoms, hematuria, rashes, h/a. He was also noted to be hypertensive in the ED with a systolic in the 735'H.  Creatinine 1.6 08/11/2015 -> 2 07/02/2016 -> 2.3 03/2017 -> 3.4-3.8 01/2018 -> 4's 05/20/2018 with >300 mg/dl glucose on u/a 05/02/2015 and 3.7 gms of proteinuria by UPC in 03/2017.   Review of Systems  Skin: Negative for itching.   Pertinent items are noted in HPI.  Chemistry and CBC: Creat  Date/Time Value Ref Range Status   07/26/2015 09:19 AM 1.45 (H) 0.70 - 1.33 mg/dL Final  07/11/2015 09:17 AM 1.44 (H) 0.70 - 1.33 mg/dL Final  06/10/2015 10:33 AM 1.51 (H) 0.70 - 1.33 mg/dL Final  06/01/2015 03:42 PM 1.37 (H) 0.70 - 1.33 mg/dL Final  11/04/2014 08:35 AM 1.31 0.50 - 1.35 mg/dL Final  08/04/2014 02:53 PM 1.21 0.50 - 1.35 mg/dL Final  10/28/2012 08:58 AM 1.25 0.50 - 1.35 mg/dL Final  07/22/2012 10:42 AM 1.12 0.50 - 1.35 mg/dL Final  08/20/2011 05:50 PM 0.99 0.50 - 1.35 mg/dL Final   Creatinine, Ser  Date/Time Value Ref Range Status  05/23/2018 07:53 AM 4.57 (H) 0.61 - 1.24 mg/dL Final  05/22/2018 08:16 PM 4.73 (H) 0.61 - 1.24 mg/dL Final  05/20/2018 05:09 PM 4.35 (H) 0.40 - 1.50 mg/dL Final  02/07/2018 04:01 PM 3.66 (H) 0.40 - 1.50 mg/dL Final  01/28/2018 03:51 PM 3.48 (H) 0.40 - 1.50 mg/dL Final  12/26/2017 08:26 AM 3.89 (H) 0.40 - 1.50 mg/dL Final  10/28/2017 08:16 AM 3.06 (H) 0.40 - 1.50 mg/dL Final  09/24/2017 08:23 AM 3.30 (H) 0.40 - 1.50 mg/dL Final  06/25/2017 10:44 AM 2.60 (H) 0.61 - 1.24 mg/dL Final  03/21/2017 11:24 AM 2.31 (H) 0.76 - 1.27 mg/dL Final  12/31/2016 10:20 AM 2.33 (H) 0.40 - 1.50 mg/dL Final  07/02/2016 11:57 AM 2.02 (H) 0.40 - 1.50 mg/dL Final  12/29/2015 11:54 AM 1.78 (H) 0.40 -  1.50 mg/dL Final  08/11/2015 11:28 AM 1.62 (H) 0.40 - 1.50 mg/dL Final  05/05/2015 04:48 AM 1.47 (H) 0.61 - 1.24 mg/dL Final  05/04/2015 04:53 AM 1.31 (H) 0.61 - 1.24 mg/dL Final  05/03/2015 04:40 AM 1.63 (H) 0.61 - 1.24 mg/dL Final  05/02/2015 12:10 PM 1.42 (H) 0.61 - 1.24 mg/dL Final  09/11/2008 10:27 AM 1.1 0.4 - 1.5 mg/dL Final   Recent Labs  Lab 05/20/18 1709 05/22/18 2016 05/23/18 0753  NA 138 137 138  K 4.5 3.9 3.8  CL 107 107 108  CO2 22 22 22   GLUCOSE 71 90 80  BUN 45* 50* 44*  CREATININE 4.35* 4.73* 4.57*  CALCIUM 8.4 8.1* 8.2*   Recent Labs  Lab 05/20/18 1709 05/22/18 2016  WBC 9.3 9.3  NEUTROABS 6.8 6.8  HGB 9.8* 9.0*  HCT 29.9* 28.6*  MCV 76.1* 76.9*  PLT 276.0 276    Liver Function Tests: No results for input(s): AST, ALT, ALKPHOS, BILITOT, PROT, ALBUMIN in the last 168 hours. No results for input(s): LIPASE, AMYLASE in the last 168 hours. No results for input(s): AMMONIA in the last 168 hours. Cardiac Enzymes: Recent Labs  Lab 05/22/18 2016 05/23/18 0753  TROPONINI 0.08* 0.07*   Iron Studies: No results for input(s): IRON, TIBC, TRANSFERRIN, FERRITIN in the last 72 hours. PT/INR: @LABRCNTIP (inr:5)  Xrays/Other Studies: ) Results for orders placed or performed during the hospital encounter of 05/22/18 (from the past 48 hour(s))  Basic metabolic panel     Status: Abnormal   Collection Time: 05/22/18  8:16 PM  Result Value Ref Range   Sodium 137 135 - 145 mmol/L   Potassium 3.9 3.5 - 5.1 mmol/L   Chloride 107 98 - 111 mmol/L   CO2 22 22 - 32 mmol/L   Glucose, Bld 90 70 - 99 mg/dL   BUN 50 (H) 6 - 20 mg/dL   Creatinine, Ser 4.73 (H) 0.61 - 1.24 mg/dL   Calcium 8.1 (L) 8.9 - 10.3 mg/dL   GFR calc non Af Amer 13 (L) >60 mL/min   GFR calc Af Amer 15 (L) >60 mL/min   Anion gap 8 5 - 15    Comment: Performed at [facility], [address] [place], [place] [address]  Troponin I - Once     Status: Abnormal   Collection Time: 05/22/18  8:16 PM  Result Value Ref Range   Troponin I 0.08 (HH) <0.03 ng/mL    Comment: CRITICAL RESULT CALLED TO, READ BACK BY AND VERIFIED WITH: Magda Bernheim RN AT 2100 ON 05/22/18 BY [email or web address] Performed at [facility], [address] [place], [place] [address]   CBC with Differential     Status: Abnormal   Collection Time: 05/22/18  8:16 PM  Result Value Ref Range   WBC 9.3 4.0 - 10.5 K/uL   RBC 3.72 (L) 4.22 - 5.81 MIL/uL   Hemoglobin 9.0 (L) 13.0 - 17.0 g/dL   HCT 28.6 (L) 39.0 - 52.0 %   MCV 76.9 (L) 80.0 - 100.0 fL   MCH 24.2 (L) 26.0 - 34.0 pg   MCHC 31.5 30.0 - 36.0 g/dL   RDW 15.2 11.5 - 15.5 %   Platelets 276 150 - 400 K/uL   nRBC 0.0 0.0 - 0.2 %   Neutrophils Relative % 74 %    Neutro Abs 6.8 1.7 - 7.7 K/uL   Lymphocytes Relative 12 %   Lymphs Abs 1.1 0.7 - 4.0 K/uL  Monocytes Relative 11 %   Monocytes Absolute 1.1 (H) 0.1 - 1.0 K/uL   Eosinophils Relative 2 %   Eosinophils Absolute 0.2 0.0 - 0.5 K/uL   Basophils Relative 1 %   Basophils Absolute 0.1 0.0 - 0.1 K/uL   Immature Granulocytes 0 %   Abs Immature Granulocytes 0.02 0.00 - 0.07 K/uL    Comment: Performed at [facility], [address] [place], [place] [address]  POC CBG, ED     Status: Abnormal   Collection Time: 05/23/18  7:33 AM  Result Value Ref Range   Glucose-Capillary 58 (L) 70 - 99 mg/dL   Comment 1 FASTING    Comment 2 Notify RN   Troponin I - Once     Status: Abnormal   Collection Time: 05/23/18  7:53 AM  Result Value Ref Range   Troponin I 0.07 (HH) <0.03 ng/mL    Comment: CRITICAL RESULT CALLED TO, READ BACK BY AND VERIFIED WITH: MARVA AT [place] 05/23/18 [facility] Performed at [facility], [address] [place], [place] [address]   Brain natriuretic peptide     Status: Abnormal   Collection Time: 05/23/18  7:53 AM  Result Value Ref Range   B Natriuretic Peptide 1,320.5 (H) 0.0 - 100.0 pg/mL    Comment: Performed at [facility], [address] [place], [place] [address]  Basic metabolic panel     Status: Abnormal   Collection Time: 05/23/18  7:53 AM  Result Value Ref Range   Sodium 138 135 - 145 mmol/L   Potassium 3.8 3.5 - 5.1 mmol/L   Chloride 108 98 - 111 mmol/L   CO2 22 22 - 32 mmol/L   Glucose, Bld 80 70 - 99 mg/dL   BUN 44 (H) 6 - 20 mg/dL   Creatinine, Ser 4.57 (H) 0.61 - 1.24 mg/dL   Calcium 8.2 (L) 8.9 - 10.3 mg/dL   GFR calc non Af Amer 13 (L) >60 mL/min   GFR calc Af Amer 16 (L) >60 mL/min   Anion gap 8 5 - 15    Comment: Performed at [facility], [address] [place], [place] [address]  POC CBG, ED     Status: Abnormal   Collection Time: 05/23/18 10:12 AM  Result Value Ref Range   Glucose-Capillary 126 (H)  70 - 99 mg/dL   Comment 1 Notify RN   Glucose, capillary     Status: None   Collection Time: 05/23/18 12:51 PM  Result Value Ref Range   Glucose-Capillary 86 70 - 99 mg/dL   Dg Chest 2 View  Result Date: 05/22/2018 CLINICAL DATA:  Shortness of breath for 2 weeks EXAM: CHEST - 2 VIEW COMPARISON:  05/08/2018 FINDINGS: Small bilateral pleural effusions. Borderline cardiomegaly with central vascular congestion and patchy perihilar opacities suspect for mild edema. No pneumothorax. Degenerative changes of the spine. IMPRESSION: Small bilateral pleural effusions. Borderline cardiomegaly with vascular congestion and mild perihilar edema. Electronically Signed   By: Donavan Foil M.D.   On: 05/22/2018 20:53    PMH:   Past Medical History:  Diagnosis Date  . Acute systolic CHF (congestive heart failure), NYHA class 2 ([place]) 05/04/2015  . Acute-on-chronic kidney injury ([place]) 05/04/2015  . Diabetes mellitus   . Erectile dysfunction   . Hyperlipidemia   . Hypertension     PSH:   Past Surgical History:  Procedure Laterality Date  . CARDIAC CATHETERIZATION N/A 06/15/2015  Procedure: Left Heart Cath and Coronary Angiography;  Surgeon: Peter M Martinique, MD;  Location: [facility];  Service: Cardiovascular;  Laterality: N/A;  . CHOLECYSTECTOMY N/A 06/28/2017   Procedure: LAPAROSCOPIC CHOLECYSTECTOMY;  Surgeon: Ralene Ok, MD;  Location: [facility];  Service: General;  Laterality: N/A;  . HERNIA REPAIR  7th grade   umbilical    Allergies:  Allergies  Allergen Reactions  . Lipitor [Atorvastatin] Hives and Itching    Medications:   Prior to Admission medications   Medication Sig Start Date End Date Taking? Authorizing Provider  amLODipine (NORVASC) 5 MG tablet Take 1 tablet (5 mg total) by mouth daily. 05/02/18  Yes Park Liter, MD  aspirin EC 81 MG tablet Take 81 mg by mouth daily.   Yes [provider]  carvedilol (COREG) 6.25 MG tablet Take 1 tablet (6.25 mg total) by  mouth 2 (two) times daily. Patient taking differently: Take 6.25 mg by mouth 2 (two) times daily with a meal.  05/02/18  Yes Park Liter, MD  furosemide (LASIX) 40 MG tablet Take 1 tablet (40 mg total) by mouth 2 (two) times daily. Patient taking differently: Take 40 mg by mouth See admin instructions. Take 1 tablet (40 mg) by mouth twice daily - morning and afternoon 05/20/18  Yes Biagio Borg, MD  hydrALAZINE (APRESOLINE) 50 MG tablet Take 50 mg by mouth 3 (three) times daily.   Yes [provider]  Insulin Glargine (LANTUS SOLOSTAR) 100 UNIT/ML Solostar Pen Inject 60 Units into the skin every morning. And pen needles 1/day Patient taking differently: Inject 60 Units into the skin daily at 3 pm. And pen needles 1/day 09/23/17  Yes Renato Shin, MD  isosorbide mononitrate (IMDUR) 30 MG 24 hr tablet Take 1 tablet (30 mg total) by mouth daily. 05/02/18 07/31/18 Yes Park Liter, MD  rosuvastatin (CRESTOR) 20 MG tablet Take 2 tablets (40 mg total) by mouth daily. Patient taking differently: Take 20 mg by mouth daily.  02/17/18  Yes Park Liter, MD  Albuterol Sulfate (PROAIR RESPICLICK) 782 (90 Base) MCG/ACT AEPB Inhale 2 puffs into the lungs every 6 (six) hours as needed. Patient not taking: Reported on 05/23/2018 01/31/18   Marrian Salvage, FNP  BD PEN NEEDLE NANO U/F 32G X 4 MM MISC USE ONCE DAILY AS DIRECTED 05/23/18   Renato Shin, MD  cloNIDine (CATAPRES - DOSED IN MG/24 HR) 0.3 mg/24hr patch Place 1 patch (0.3 mg total) onto the skin once a week. Patient not taking: Reported on 05/23/2018 03/11/18   Hoyt Koch, MD  Continuous Blood Gluc Receiver (FREESTYLE LIBRE 14 DAY READER) DEVI USE AS DIRECTED 11/24/17   Renato Shin, MD  fluticasone furoate-vilanterol (BREO ELLIPTA) 100-25 MCG/INH AEPB Inhale 1 puff into the lungs daily. Patient not taking: Reported on 05/23/2018 01/31/18   Marrian Salvage, FNP  glucose blood [facility] VERIO) test strip 1  each by Other route 2 (two) times daily as needed for other. And lancets 2/day 02/08/16   Renato Shin, MD  hydrALAZINE (APRESOLINE) 10 MG tablet Take 1 tablet (10 mg total) by mouth 3 (three) times daily. Patient not taking: Reported on 05/23/2018 02/17/18   Park Liter, MD  Spacer/Aero-Holding Chambers (AEROCHAMBER MV) inhaler Use as instructed 07/20/15   Magdalen Spatz, NP    Discontinued Meds:   Medications Discontinued During This Encounter  Medication Reason  . predniSONE (DELTASONE) 20 MG tablet Completed Course    Social History:  reports that  he has never smoked. He has never used smokeless tobacco. He reports that he does not drink alcohol or use drugs.  Family History:   Family History  Problem Relation Age of Onset  . Arthritis Mother   . COPD Mother   . Diabetes Mother        was thin, took insulin  . Hypertension Mother   . Hypertension Father   . Arthritis Sister        rheumatoid  . COPD Sister   . Diabetes Brother   . Colon cancer Neg Hx     Blood pressure (!) 220/102, pulse 78, temperature 97.8 F (36.6 C), temperature source Oral, resp. rate 20, height 5\' 10"  (1.778 m), weight 84.6 kg, SpO2 99 %. General appearance: alert, cooperative and appears stated age Head: Normocephalic, without obvious abnormality, atraumatic Eyes: negative Neck: no adenopathy, no carotid bruit, supple, symmetrical, trachea midline and thyroid not enlarged, symmetric, no tenderness/mass/nodules Back: symmetric, no curvature. ROM normal. No CVA tenderness. Resp: clear to auscultation bilaterally Chest wall: no tenderness Cardio: S1, S2 normal and S3 present GI: soft, non-tender; bowel sounds normal; no masses,  no organomegaly Extremities: edema tr-1+ Pulses: 2+ and symmetric Skin: Skin color, texture, turgor normal. No rashes or lesions Lymph nodes: Cervical adenopathy: neg Neurologic: Grossly normal       Fontaine Hehl, Hunt Oris, MD 05/23/2018, 4:22 PM

## 2018-05-24 ENCOUNTER — Inpatient Hospital Stay (HOSPITAL_COMMUNITY): Payer: Managed Care, Other (non HMO)

## 2018-05-24 DIAGNOSIS — Z794 Long term (current) use of insulin: Secondary | ICD-10-CM

## 2018-05-24 DIAGNOSIS — R7989 Other specified abnormal findings of blood chemistry: Secondary | ICD-10-CM

## 2018-05-24 DIAGNOSIS — R0609 Other forms of dyspnea: Secondary | ICD-10-CM

## 2018-05-24 DIAGNOSIS — E0821 Diabetes mellitus due to underlying condition with diabetic nephropathy: Secondary | ICD-10-CM

## 2018-05-24 DIAGNOSIS — E1169 Type 2 diabetes mellitus with other specified complication: Secondary | ICD-10-CM

## 2018-05-24 DIAGNOSIS — I160 Hypertensive urgency: Secondary | ICD-10-CM

## 2018-05-24 DIAGNOSIS — D869 Sarcoidosis, unspecified: Secondary | ICD-10-CM

## 2018-05-24 DIAGNOSIS — I5023 Acute on chronic systolic (congestive) heart failure: Secondary | ICD-10-CM

## 2018-05-24 DIAGNOSIS — N184 Chronic kidney disease, stage 4 (severe): Secondary | ICD-10-CM

## 2018-05-24 DIAGNOSIS — D631 Anemia in chronic kidney disease: Secondary | ICD-10-CM

## 2018-05-24 DIAGNOSIS — E785 Hyperlipidemia, unspecified: Secondary | ICD-10-CM

## 2018-05-24 LAB — BASIC METABOLIC PANEL
Anion gap: 10 (ref 5–15)
BUN: 40 mg/dL — ABNORMAL HIGH (ref 6–20)
CO2: 22 mmol/L (ref 22–32)
Calcium: 8.3 mg/dL — ABNORMAL LOW (ref 8.9–10.3)
Chloride: 107 mmol/L (ref 98–111)
Creatinine, Ser: 4.6 mg/dL — ABNORMAL HIGH (ref 0.61–1.24)
GFR calc Af Amer: 15 mL/min — ABNORMAL LOW (ref >60)
GFR calc non Af Amer: 13 mL/min — ABNORMAL LOW (ref >60)
GLUCOSE: 88 mg/dL (ref 70–99)
Potassium: 3.5 mmol/L (ref 3.5–5.1)
Sodium: 139 mmol/L (ref 135–145)

## 2018-05-24 LAB — CBC
HCT: 27.1 % — ABNORMAL LOW (ref 39.0–52.0)
Hemoglobin: 9.2 g/dL — ABNORMAL LOW (ref 13.0–17.0)
MCH: 25.6 pg — ABNORMAL LOW (ref 26.0–34.0)
MCHC: 33.9 g/dL (ref 30.0–36.0)
MCV: 75.3 fL — ABNORMAL LOW (ref 80.0–100.0)
Platelets: 293 10*3/uL (ref 150–400)
RBC: 3.6 MIL/uL — ABNORMAL LOW (ref 4.22–5.81)
RDW: 14.8 % (ref 11.5–15.5)
WBC: 7.9 10*3/uL (ref 4.0–10.5)
nRBC: 0 % (ref 0.0–0.2)

## 2018-05-24 LAB — URINALYSIS, ROUTINE W REFLEX MICROSCOPIC
BILIRUBIN URINE: NEGATIVE
Bacteria, UA: NONE SEEN
Glucose, UA: 50 mg/dL — AB
Ketones, ur: NEGATIVE mg/dL
Leukocytes, UA: NEGATIVE
Nitrite: NEGATIVE
Protein, ur: 100 mg/dL — AB
SPECIFIC GRAVITY, URINE: 1.01 (ref 1.005–1.030)
pH: 7 (ref 5.0–8.0)

## 2018-05-24 LAB — GLUCOSE, CAPILLARY
Glucose-Capillary: 87 mg/dL (ref 70–99)
Glucose-Capillary: 135 mg/dL — ABNORMAL HIGH (ref 70–99)
Glucose-Capillary: 141 mg/dL — ABNORMAL HIGH (ref 70–99)

## 2018-05-24 LAB — BRAIN NATRIURETIC PEPTIDE: B Natriuretic Peptide: 1449.4 pg/mL — ABNORMAL HIGH (ref 0.0–100.0)

## 2018-05-24 LAB — MAGNESIUM: Magnesium: 2 mg/dL (ref 1.7–2.4)

## 2018-05-24 LAB — HIV ANTIBODY (ROUTINE TESTING W REFLEX): HIV Screen 4th Generation wRfx: NONREACTIVE

## 2018-05-24 LAB — HEPARIN LEVEL (UNFRACTIONATED): Heparin Unfractionated: 0.63 IU/mL (ref 0.30–0.70)

## 2018-05-24 MED ORDER — HYDRALAZINE HCL 50 MG PO TABS
50.0000 mg | ORAL_TABLET | Freq: Three times a day (TID) | ORAL | Status: DC
  Administered 2018-05-24 – 2018-05-27 (×8): 50 mg via ORAL
  Filled 2018-05-24 (×8): qty 1

## 2018-05-24 MED ORDER — ROSUVASTATIN CALCIUM 20 MG PO TABS
40.0000 mg | ORAL_TABLET | Freq: Every day | ORAL | Status: DC
  Administered 2018-05-24 – 2018-05-26 (×3): 40 mg via ORAL
  Filled 2018-05-24 (×3): qty 2

## 2018-05-24 MED ORDER — ASPIRIN EC 81 MG PO TBEC
81.0000 mg | DELAYED_RELEASE_TABLET | Freq: Every day | ORAL | Status: DC
  Administered 2018-05-24 – 2018-05-27 (×4): 81 mg via ORAL
  Filled 2018-05-24 (×4): qty 1

## 2018-05-24 MED ORDER — CARVEDILOL 6.25 MG PO TABS
6.2500 mg | ORAL_TABLET | Freq: Two times a day (BID) | ORAL | Status: DC
  Administered 2018-05-24 – 2018-05-27 (×6): 6.25 mg via ORAL
  Filled 2018-05-24 (×6): qty 1

## 2018-05-24 MED ORDER — AMLODIPINE BESYLATE 2.5 MG PO TABS
5.0000 mg | ORAL_TABLET | Freq: Every day | ORAL | Status: DC
  Administered 2018-05-24 – 2018-05-26 (×3): 5 mg via ORAL
  Filled 2018-05-24 (×3): qty 2

## 2018-05-24 MED ORDER — ISOSORBIDE MONONITRATE ER 30 MG PO TB24
30.0000 mg | ORAL_TABLET | Freq: Every day | ORAL | Status: DC
  Administered 2018-05-24 – 2018-05-27 (×4): 30 mg via ORAL
  Filled 2018-05-24 (×4): qty 1

## 2018-05-24 NOTE — Progress Notes (Signed)
ANTICOAGULATION CONSULT NOTE - Follow-up Consult  Pharmacy Consult for Heparin Indication: r/o pulmonary embolus  Allergies  Allergen Reactions  . Lipitor [Atorvastatin] Hives and Itching    Patient Measurements: Height: 5\' 10"  (177.8 cm) Weight: 183 lb 6.4 oz (83.2 kg)(scale c) IBW/kg (Calculated) : 73  Vital Signs: Temp: 99.4 F (37.4 C) (01/11 0329) Temp Source: Oral (01/11 0329) BP: 142/71 (01/11 0329) Pulse Rate: 76 (01/11 0329)  Labs: Recent Labs    05/22/18 2016 05/23/18 0753 05/23/18 2220 05/24/18 0411  HGB 9.0*  --   --  9.2*  HCT 28.6*  --   --  27.1*  PLT 276  --   --  293  HEPARINUNFRC  --   --  0.43 0.63  CREATININE 4.73* 4.57*  --  4.60*  TROPONINI 0.08* 0.07*  --   --     Estimated Creatinine Clearance: 18.7 mL/min (A) (by C-G formula based on SCr of 4.6 mg/dL (H)).  Assessment: 58 yo male with shortness of breath. On admission, suspected PE. Pharmacy consulted to initiate heparin drip for PE. VQ scan now with low probability for PE.   1/11 AM Heparin level therapeutic (0.63) on gtt at 1350 units/hr. CBC stable and no bleeding noted.  Goal of Therapy:  Heparin level 0.3-0.7 units/ml Monitor platelets by anticoagulation protocol: Yes   Plan:  Continue Heparin infusion at 1350 units/hr Daily Heparin level and CBC Will contact MD regarding VQ scan results and continued need for heparin  Thank you for involving pharmacy in this patient's care  Janae Bridgeman, PharmD PGY1 Pharmacy Resident Phone: [phone] 05/24/2018 7:45 AM

## 2018-05-24 NOTE — Progress Notes (Signed)
[facility] Progress Note    Assessment/ Plan:   58 y/o man with a history of DM HTN CHF HLD CKD IV sarcoidosis presenting with dyspnea. Seen by PCP recently with a positive d-dimer and lower ext swelling, noted to have an elevated BNP. He is presenting with worsening dyspnea + increased lower extremity edema over the past week but denies cp, PND, n/v/d. He is seen for his advanced CKD by Dr. Hollie Salk with [facility] and has an appt to see [facility] for a fistula on Tues.  He is on Lasix 40mg  BID at home. He denies NSAID use and only uses Tylenol for pain. He also denies any obstructive like symptoms, hematuria, rashes, h/a. He was also noted to be hypertensive in the ED with a systolic in the 644'I.  Creatinine 1.6 08/11/2015 -> 2 07/02/2016 -> 2.3 03/2017 -> 3.4-3.8 01/2018 -> 4's 05/20/2018 with >300 mg/dl glucose on u/a 05/02/2015 and 3.7 gms of proteinuria by UPC in 03/2017.   1. 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 He may have an acute component exacerbated by the elevated BP.  - Restrict the left arm as he is right arm dominant and will ask [facility] to see him in the AM. - Vein mapping - Agree with IV diuresis; transition to orals in the next 24-48hrs. - Will ask [facility] to see him for a fistula. 2. Hypertensive urgency - ? Compliance, restart all home medications. Goal decrease ~30% in 1st 24hrs to maintain adequate cerebral perfusion. 3. DM 4. DDimer - VQ scan per primary 5. Sarcoidosis 6. Anemia - will check iron panels  Subjective:   Feels that his breathing is much better. Denies f/c/n/v   Objective:   BP (!) 162/79 (BP Location: Right Arm)   Pulse 73   Temp 98.7 F (37.1 C) (Oral)   Resp 19   Ht 5\' 10"  (1.778 m)   Wt 83.2 kg Comment: scale c  SpO2 98%   BMI 26.32 kg/m   Intake/Output Summary (Last 24 hours) at 05/24/2018 1251 Last data filed at 05/24/2018 1205 Gross per  24 hour  Intake 942.37 ml  Output 4650 ml  Net -3707.63 ml   Weight change: -5.2 kg  Physical Exam: General appearance: A&Ox3 Head: NCAT Resp: clear to auscultation bilaterally Chest wall: no tenderness Cardio: S1, S2 normal and S3 present GI: soft, non-tender; bowel sounds normal; no masses,  no organomegaly Extremities: edema tr-1+ Pulses: 2+ and symmetric  Imaging: X-ray Chest Pa And Lateral  Result Date: 05/24/2018 CLINICAL DATA:  58 year old male with CHF, shortness of breath for the past 2 weeks EXAM: CHEST - 2 VIEW COMPARISON:  Prior chest x-ray 05/22/2018 FINDINGS: Stable cardiomegaly. Atherosclerotic calcifications again noted in the transverse aorta. Similar degree of pulmonary vascular congestion without overt pulmonary edema. There are small bilateral pleural effusions as well as fluid tracking along the major and minor fissures. Mild dependent atelectasis. No evidence of pneumothorax or focal airspace consolidation. No acute osseous abnormality. IMPRESSION: 1. Cardiomegaly and pulmonary vascular congestion without overt edema. 2. Small bilateral pleural effusions and bibasilar atelectasis. 3.  Aortic Atherosclerosis (ICD10-170.0) Electronically Signed   By: Jacqulynn Cadet M.D.   On: 05/24/2018 09:22   Dg Chest 2 View  Result Date: 05/22/2018 CLINICAL DATA:  Shortness of breath for 2 weeks EXAM: CHEST - 2 VIEW COMPARISON:  05/08/2018 FINDINGS: Small bilateral  pleural effusions. Borderline cardiomegaly with central vascular congestion and patchy perihilar opacities suspect for mild edema. No pneumothorax. Degenerative changes of the spine. IMPRESSION: Small bilateral pleural effusions. Borderline cardiomegaly with vascular congestion and mild perihilar edema. Electronically Signed   By: Donavan Foil M.D.   On: 05/22/2018 20:53   Nm Pulmonary Perf And Vent  Result Date: 05/23/2018 CLINICAL DATA:  shortness of breath. Has had going for the last couple weeks. Has had shortness  of breath. History of sarcoidosis. Cough no real sputum production. Does have history of CHF. Saw PCP and had positive d-dimer. Has had some swelling in his legs to. Is diabetic. BNP elevated on 1/8 also. No chest pain. Has had a cough that is been mostly dry.Pt historyHTN, HLD, DM, CHF, acute on chronic kidney injury, cardiac cath 2017 EXAM: NUCLEAR MEDICINE VENTILATION - PERFUSION LUNG SCAN TECHNIQUE: Ventilation images were obtained in multiple projections using inhaled aerosol Tc-34m DTPA. Perfusion images were obtained in multiple projections after intravenous injection of Tc-59m MAA. RADIOPHARMACEUTICALS:  30.6 mCi of Tc-37m DTPA aerosol inhalation and 4.23 mCi Tc62m MAA IV COMPARISON:  Companion chest radiograph from the previous day demonstrating small pleural effusions and borderline cardiomegaly FINDINGS: Ventilation: Somewhat heterogenous distribution with central deposition. No focal ventilation defect. Perfusion: No wedge shaped peripheral perfusion defects to suggest acute pulmonary embolism. IMPRESSION: Low likelihood ratio for pulmonary embolism. Electronically Signed   By: Lucrezia Europe M.D.   On: 05/23/2018 17:42    Labs: BMET Recent Labs  Lab 05/20/18 1709 05/22/18 2016 05/23/18 0753 05/24/18 0411  NA 138 137 138 139  K 4.5 3.9 3.8 3.5  CL 107 107 108 107  CO2 22 22 22 22   GLUCOSE 71 90 80 88  BUN 45* 50* 44* 40*  CREATININE 4.35* 4.73* 4.57* 4.60*  CALCIUM 8.4 8.1* 8.2* 8.3*   CBC Recent Labs  Lab 05/20/18 1709 05/22/18 2016 05/24/18 0411  WBC 9.3 9.3 7.9  NEUTROABS 6.8 6.8  --   HGB 9.8* 9.0* 9.2*  HCT 29.9* 28.6* 27.1*  MCV 76.1* 76.9* 75.3*  PLT 276.0 276 293    Medications:    . furosemide  80 mg Intravenous Q12H  . insulin aspart  0-9 Units Subcutaneous TID WC  . lisinopril  2.5 mg Oral Daily  . sodium chloride flush  3 mL Intravenous Q12H  . sodium chloride flush  3 mL Intravenous Q12H      Otelia Santee, MD 05/24/2018, 12:51 PM

## 2018-05-24 NOTE — Progress Notes (Signed)
ANTICOAGULATION CONSULT NOTE - Follow-up Consult  Pharmacy Consult for Heparin Indication: r/o pulmonary embolus  Allergies  Allergen Reactions  . Lipitor [Atorvastatin] Hives and Itching    Patient Measurements: Height: 5\' 10"  (177.8 cm) Weight: 186 lb 8.2 oz (84.6 kg) IBW/kg (Calculated) : 73  Vital Signs: Temp: 100.5 F (38.1 C) (01/10 1931) Temp Source: Oral (01/10 1931) BP: 164/81 (01/10 1931) Pulse Rate: 88 (01/10 1931)  Labs: Recent Labs    05/22/18 2016 05/23/18 0753 05/23/18 2220  HGB 9.0*  --   --   HCT 28.6*  --   --   PLT 276  --   --   HEPARINUNFRC  --   --  0.43  CREATININE 4.73* 4.57*  --   TROPONINI 0.08* 0.07*  --     Estimated Creatinine Clearance: 18.9 mL/min (A) (by C-G formula based on SCr of 4.57 mg/dL (H)).  Assessment: 58 yo male with shortness of breath. Suspected PE. Pharmacy consulted to initiate heparin drip for PE. VQ scan with low probability for PE.   Heparin level therapeutic (0.43) on gtt at 1350 units/hr.  Goal of Therapy:  Heparin level 0.3-0.7 units/ml Monitor platelets by anticoagulation protocol: Yes   Plan:  Continue Heparin infusion at 1350 units/hr Daily Heparin level and CBC Will f/u with rounding team in a.m. regarding VQ scan results and continued need for heparin   Sherlon Handing, PharmD, BCPS Clinical pharmacist  **Pharmacist phone directory can now be found on [email or web address] ([email or web address]).  Listed under [place]. 05/24/2018, 12:23 AM

## 2018-05-24 NOTE — Progress Notes (Addendum)
PROGRESS NOTE  Tobias Avitabile  IFO:[id number] DOB: Apr 28, 1961 DOA: 05/22/2018 PCP: Hoyt Koch, MD   Brief Narrative: Bradshaw Minihan is a 58 y.o. male with a history of sarcoidosis, stage IV CKD, T2DM, HLD, HTN, and chronic diastolic CHF who presented for increasing shortness of breath, nonproductive cough, found to have a positive d-dimer and leg swelling. He had been treated empirically with prednisone for sarcoid flare, though this has not helped. and was sent to the ED for further evaluation. BNP found to be elevated and V/Q scan recommended for PE evaluation due to acute on chronic kidney failure. He was accepted for admission but waited at [facility] for a bed at [facility], ultimately being admitted to [facility] for nephrology and cardiology consultations for acute on chronic CHF. Echocardiogram demonstrated no severe abnormalities, preserved LV systolic function, so cardiology was not consulted. IV lasix given and his symptoms have improved somewhat.   Assessment & Plan: Principal Problem:   Acute renal failure superimposed on stage 4 chronic kidney disease (HCC) Active Problems:   Hyperlipidemia associated with type 2 diabetes mellitus (HCC)   Diabetes mellitus with renal complications (HCC)   Dyspnea   Anemia   CKD (chronic kidney disease) stage 4, GFR 15-29 ml/min (HCC)   Sarcoidosis   Acute on chronic systolic heart failure (HCC)   Positive D dimer   Hypertensive urgency  AKI on stage IV CKD, progression to stage V CKD:  - Continue aggressive IV diuresis. 4.6L out/24 hrs with weight down 197 > 186 > 183lbs.  - Monitor UOP, BMP - VVS consulted, vein mapping ordered - No LV dysfunction, so DC ACE!  Acute on chronic diastolic CHF: Precipitated by worsening renal function most likely.  - Diuresis as above - Monitor daily weights, strict I/O  HTN urgency: ?nonadherence as BP improved with restarting home medications - Will restart home imdur, hydralazine, norvasc tonight. Caution  to avoid overly rapid correction. - Restart coreg in AM if BP not low.  Fever: Isolated overnight with actual decrease in WBC 9 > 7.  - Blood cultures drawn, monitor off abx without definite source and hemodynamic stability. - UA ordered, does not appear infected and pt w/o symptoms. No infiltrate on CXR. Not likely to have PE  - Incentive spirometry - Check LE venous dopplers given edema and +d-dimer as DVT would change management.  Anemia of chronic kidney disease:  - Per nephrology  T2DM: HbA1c 6.9%. - Hold/discontinue metformin - SSI  HLD:  - Continue statin  Sarcoidosis: No hx flare/complication to date. No indication for steroids currently.   Positive d-dimer: Low probability V/Q scan, DC heparin.   DVT prophylaxis: Heparin Code Status: Full Family Communication: Fiance at bedside Disposition Plan: Home once cleared by nephrology/euvolemic  Consultants:   Nephrology  VVS  Procedures:   None  Antimicrobials:  None   Subjective: Dyspnea improved, still winded with exertion. No chest pain, leg swelling still bothersome.  Objective: Vitals:   05/24/18 0329 05/24/18 0820 05/24/18 1202 05/24/18 1710  BP: (!) 142/71 (!) 182/98 (!) 162/79 (!) 175/90  Pulse: 76 74 73 76  Resp: 18 18 19 19   Temp: 99.4 F (37.4 C) 98.9 F (37.2 C) 98.7 F (37.1 C) 99 F (37.2 C)  TempSrc: Oral Oral Oral Oral  SpO2: 90% 98% 98% 99%  Weight:      Height:        Intake/Output Summary (Last 24 hours) at 05/24/2018 1753 Last data filed at 05/24/2018 1712 Gross per 24 hour  Intake 922.37 ml  Output 4825 ml  Net -3902.63 ml   Filed Weights   05/22/18 1950 05/23/18 1254 05/24/18 0033  Weight: 89.8 kg 84.6 kg 83.2 kg    Gen: 58 y.o. male in no distress Pulm: Non-labored breathing. Clear CV: Regular rate and rhythm. No murmur, rub, or gallop. No JVD, 1+ pedal edema. GI: Abdomen soft, non-tender, non-distended, with normoactive bowel sounds. No organomegaly or masses  felt. Ext: Warm, no deformities Skin: No rashes, lesions or ulcers Neuro: Alert and oriented. No focal neurological deficits. Psych: Judgement and insight appear normal. Mood & affect appropriate.   Data Reviewed: I have personally reviewed following labs and imaging studies  CBC: Recent Labs  Lab 05/20/18 1709 05/22/18 2016 05/24/18 0411  WBC 9.3 9.3 7.9  NEUTROABS 6.8 6.8  --   HGB 9.8* 9.0* 9.2*  HCT 29.9* 28.6* 27.1*  MCV 76.1* 76.9* 75.3*  PLT 276.0 276 035   Basic Metabolic Panel: Recent Labs  Lab 05/20/18 1709 05/22/18 2016 05/23/18 0753 05/24/18 0411  NA 138 137 138 139  K 4.5 3.9 3.8 3.5  CL 107 107 108 107  CO2 22 22 22 22   GLUCOSE 71 90 80 88  BUN 45* 50* 44* 40*  CREATININE 4.35* 4.73* 4.57* 4.60*  CALCIUM 8.4 8.1* 8.2* 8.3*  MG  --   --   --  2.0   GFR: Estimated Creatinine Clearance: 18.7 mL/min (A) (by C-G formula based on SCr of 4.6 mg/dL (H)). Liver Function Tests: No results for input(s): AST, ALT, ALKPHOS, BILITOT, PROT, ALBUMIN in the last 168 hours. No results for input(s): LIPASE, AMYLASE in the last 168 hours. No results for input(s): AMMONIA in the last 168 hours. Coagulation Profile: No results for input(s): INR, PROTIME in the last 168 hours. Cardiac Enzymes: Recent Labs  Lab 05/22/18 2016 05/23/18 0753  TROPONINI 0.08* 0.07*   BNP (last 3 results) Recent Labs    02/07/18 1601 05/20/18 1709  PROBNP 478.0* 1,460.0*   HbA1C: Recent Labs    05/23/18 2220  HGBA1C 6.9*   CBG: Recent Labs  Lab 05/23/18 1251 05/23/18 1752 05/23/18 2109 05/24/18 0816 05/24/18 1200  GLUCAP 86 83 91 87 135*   Lipid Profile: No results for input(s): CHOL, HDL, LDLCALC, TRIG, CHOLHDL, LDLDIRECT in the last 72 hours. Thyroid Function Tests: No results for input(s): TSH, T4TOTAL, FREET4, T3FREE, THYROIDAB in the last 72 hours. Anemia Panel: No results for input(s): VITAMINB12, FOLATE, FERRITIN, TIBC, IRON, RETICCTPCT in the last 72  hours. Urine analysis:    Component Value Date/Time   COLORURINE STRAW (A) 05/24/2018 1216   APPEARANCEUR CLEAR 05/24/2018 1216   APPEARANCEUR Clear 03/21/2017 1124   LABSPEC 1.010 05/24/2018 1216   PHURINE 7.0 05/24/2018 1216   GLUCOSEU 50 (A) 05/24/2018 1216   HGBUR SMALL (A) 05/24/2018 1216   BILIRUBINUR NEGATIVE 05/24/2018 1216   BILIRUBINUR Negative 03/21/2017 1124   KETONESUR NEGATIVE 05/24/2018 1216   PROTEINUR 100 (A) 05/24/2018 1216   UROBILINOGEN 0.2 08/20/2011 1801   UROBILINOGEN 1.0 09/11/2008 1120   NITRITE NEGATIVE 05/24/2018 1216   LEUKOCYTESUR NEGATIVE 05/24/2018 1216   LEUKOCYTESUR Negative 03/21/2017 1124   Recent Results (from the past 240 hour(s))  Culture, blood (routine x 2)     Status: None (Preliminary result)   Collection Time: 05/24/18  8:31 AM  Result Value Ref Range Status   Specimen Description BLOOD RIGHT HAND  Final   Special Requests   Final    BOTTLES DRAWN AEROBIC ONLY  Blood Culture adequate volume Performed at [facility] [address], [place], [place] [address]    Culture NO GROWTH < 12 HOURS  Final   Report Status PENDING  Incomplete  Culture, blood (routine x 2)     Status: None (Preliminary result)   Collection Time: 05/24/18  8:37 AM  Result Value Ref Range Status   Specimen Description BLOOD RIGHT HAND  Final   Special Requests   Final    BOTTLES DRAWN AEROBIC ONLY Blood Culture adequate volume Performed at [facility], [place] [address], [place], [place] [address]    Culture NO GROWTH < 12 HOURS  Final   Report Status PENDING  Incomplete      Radiology Studies: X-ray Chest Pa And Lateral  Result Date: 05/24/2018 CLINICAL DATA:  58 year old male with CHF, shortness of breath for the past 2 weeks EXAM: CHEST - 2 VIEW COMPARISON:  Prior chest x-ray 05/22/2018 FINDINGS: Stable cardiomegaly. Atherosclerotic calcifications again noted in the transverse aorta. Similar degree of pulmonary vascular congestion without overt  pulmonary edema. There are small bilateral pleural effusions as well as fluid tracking along the major and minor fissures. Mild dependent atelectasis. No evidence of pneumothorax or focal airspace consolidation. No acute osseous abnormality. IMPRESSION: 1. Cardiomegaly and pulmonary vascular congestion without overt edema. 2. Small bilateral pleural effusions and bibasilar atelectasis. 3.  Aortic Atherosclerosis (ICD10-170.0) Electronically Signed   By: Jacqulynn Cadet M.D.   On: 05/24/2018 09:22   Dg Chest 2 View  Result Date: 05/22/2018 CLINICAL DATA:  Shortness of breath for 2 weeks EXAM: CHEST - 2 VIEW COMPARISON:  05/08/2018 FINDINGS: Small bilateral pleural effusions. Borderline cardiomegaly with central vascular congestion and patchy perihilar opacities suspect for mild edema. No pneumothorax. Degenerative changes of the spine. IMPRESSION: Small bilateral pleural effusions. Borderline cardiomegaly with vascular congestion and mild perihilar edema. Electronically Signed   By: Donavan Foil M.D.   On: 05/22/2018 20:53   Nm Pulmonary Perf And Vent  Result Date: 05/23/2018 CLINICAL DATA:  shortness of breath. Has had going for the last couple weeks. Has had shortness of breath. History of sarcoidosis. Cough no real sputum production. Does have history of CHF. Saw PCP and had positive d-dimer. Has had some swelling in his legs to. Is diabetic. BNP elevated on 1/8 also. No chest pain. Has had a cough that is been mostly dry.Pt historyHTN, HLD, DM, CHF, acute on chronic kidney injury, cardiac cath 2017 EXAM: NUCLEAR MEDICINE VENTILATION - PERFUSION LUNG SCAN TECHNIQUE: Ventilation images were obtained in multiple projections using inhaled aerosol Tc-60m DTPA. Perfusion images were obtained in multiple projections after intravenous injection of Tc-60m MAA. RADIOPHARMACEUTICALS:  30.6 mCi of Tc-51m DTPA aerosol inhalation and 4.23 mCi Tc57m MAA IV COMPARISON:  Companion chest radiograph from the previous day  demonstrating small pleural effusions and borderline cardiomegaly FINDINGS: Ventilation: Somewhat heterogenous distribution with central deposition. No focal ventilation defect. Perfusion: No wedge shaped peripheral perfusion defects to suggest acute pulmonary embolism. IMPRESSION: Low likelihood ratio for pulmonary embolism. Electronically Signed   By: Lucrezia Europe M.D.   On: 05/23/2018 17:42    Scheduled Meds: . furosemide  80 mg Intravenous Q12H  . insulin aspart  0-9 Units Subcutaneous TID WC  . lisinopril  2.5 mg Oral Daily  . sodium chloride flush  3 mL Intravenous Q12H  . sodium chloride flush  3 mL Intravenous Q12H   Continuous Infusions: . sodium chloride       LOS: 2 days  Time spent: 35 minutes.  Patrecia Pour, MD [facility] [email or web address] Password [facility] 05/24/2018, 5:53 PM

## 2018-05-24 NOTE — Plan of Care (Signed)
  Problem: Pain Managment: Goal: General experience of comfort will improve Outcome: Progressing   Problem: Safety: Goal: Ability to remain free from injury will improve Outcome: Progressing   Problem: Cardiac: Goal: Ability to achieve and maintain adequate cardiopulmonary perfusion will improve Outcome: Progressing

## 2018-05-24 NOTE — Consult Note (Signed)
Hospital Consult    Reason for Consult:  Permanent dialysis access Referring Physician:  Dr. Augustin Coupe MRN #:  [id number]  History of Present Illness: This is a 58 y.o. male with history of diabetes, hypertension, heart failure that presented with AKI on CKD stage IV and vascular surgery has been consulted for permanent dialysis access.  Patient states he is right-handed.  [place]  States he has never had dialysis access before.  States he is not currently on dialysis.  No previous catheters.  Past Medical History:  Diagnosis Date  . Acute systolic CHF (congestive heart failure), NYHA class 2 ([place]) 05/04/2015  . Acute-on-chronic kidney injury ([place]) 05/04/2015  . Diabetes mellitus   . Erectile dysfunction   . Hyperlipidemia   . Hypertension     Past Surgical History:  Procedure Laterality Date  . CARDIAC CATHETERIZATION N/A 06/15/2015   Procedure: Left Heart Cath and Coronary Angiography;  Surgeon: Peter M Martinique, MD;  Location: [facility];  Service: Cardiovascular;  Laterality: N/A;  . CHOLECYSTECTOMY N/A 06/28/2017   Procedure: LAPAROSCOPIC CHOLECYSTECTOMY;  Surgeon: Ralene Ok, MD;  Location: [facility];  Service: General;  Laterality: N/A;  . HERNIA REPAIR  7th grade   umbilical    Allergies  Allergen Reactions  . Lipitor [Atorvastatin] Hives and Itching    Prior to Admission medications   Medication Sig Start Date End Date Taking? Authorizing Provider  amLODipine (NORVASC) 5 MG tablet Take 1 tablet (5 mg total) by mouth daily. 05/02/18  Yes Park Liter, MD  aspirin EC 81 MG tablet Take 81 mg by mouth daily.   Yes [provider]  carvedilol (COREG) 6.25 MG tablet Take 1 tablet (6.25 mg total) by mouth 2 (two) times daily. Patient taking differently: Take 6.25 mg by mouth 2 (two) times daily with a meal.  05/02/18  Yes Park Liter, MD  furosemide (LASIX) 40 MG tablet Take 1 tablet (40 mg total) by mouth 2 (two) times  daily. Patient taking differently: Take 40 mg by mouth See admin instructions. Take 1 tablet (40 mg) by mouth twice daily - morning and afternoon 05/20/18  Yes Biagio Borg, MD  hydrALAZINE (APRESOLINE) 50 MG tablet Take 50 mg by mouth 3 (three) times daily.   Yes [provider]  Insulin Glargine (LANTUS SOLOSTAR) 100 UNIT/ML Solostar Pen Inject 60 Units into the skin every morning. And pen needles 1/day Patient taking differently: Inject 60 Units into the skin daily at 3 pm. And pen needles 1/day 09/23/17  Yes Renato Shin, MD  isosorbide mononitrate (IMDUR) 30 MG 24 hr tablet Take 1 tablet (30 mg total) by mouth daily. 05/02/18 07/31/18 Yes Park Liter, MD  rosuvastatin (CRESTOR) 20 MG tablet Take 2 tablets (40 mg total) by mouth daily. Patient taking differently: Take 20 mg by mouth daily.  02/17/18  Yes Park Liter, MD  Albuterol Sulfate (PROAIR RESPICLICK) 329 (90 Base) MCG/ACT AEPB Inhale 2 puffs into the lungs every 6 (six) hours as needed. Patient not taking: Reported on 05/23/2018 01/31/18   Marrian Salvage, FNP  BD PEN NEEDLE NANO U/F 32G X 4 MM MISC USE ONCE DAILY AS DIRECTED 05/23/18   Renato Shin, MD  cloNIDine (CATAPRES - DOSED IN MG/24 HR) 0.3 mg/24hr patch Place 1 patch (0.3 mg total) onto the skin once a week. Patient not taking: Reported on 05/23/2018 03/11/18   Hoyt Koch, MD  Continuous Blood Gluc Receiver (FREESTYLE [place]  14 DAY READER) DEVI USE AS DIRECTED 11/24/17   Renato Shin, MD  fluticasone furoate-vilanterol (BREO ELLIPTA) 100-25 MCG/INH AEPB Inhale 1 puff into the lungs daily. Patient not taking: Reported on 05/23/2018 01/31/18   Marrian Salvage, FNP  glucose blood [facility] VERIO) test strip 1 each by Other route 2 (two) times daily as needed for other. And lancets 2/day 02/08/16   Renato Shin, MD  hydrALAZINE (APRESOLINE) 10 MG tablet Take 1 tablet (10 mg total) by mouth 3 (three) times daily. Patient not taking: Reported  on 05/23/2018 02/17/18   Park Liter, MD  Spacer/Aero-Holding Chambers (AEROCHAMBER MV) inhaler Use as instructed 07/20/15   Magdalen Spatz, NP    Social History   Socioeconomic History  . Marital status: Divorced    Spouse name: n/a  . Number of children: 1  . Years of education: 12+  . Highest education level: Not on file  Occupational History  . Occupation: Building services engineer: [employer]  Social Needs  . Financial resource strain: Not on file  . Food insecurity:    Worry: Not on file    Inability: Not on file  . Transportation needs:    Medical: Not on file    Non-medical: Not on file  Tobacco Use  . Smoking status: Never Smoker  . Smokeless tobacco: Never Used  Substance and Sexual Activity  . Alcohol use: No    Alcohol/week: 0.0 standard drinks  . Drug use: No  . Sexual activity: Not Currently    Comment: Erectile dysfunciotn  Lifestyle  . Physical activity:    Days per week: Not on file    Minutes per session: Not on file  . Stress: Not on file  Relationships  . Social connections:    Talks on phone: Not on file    Gets together: Not on file    Attends religious service: Not on file    Active member of club or organization: Not on file    Attends meetings of clubs or organizations: Not on file    Relationship status: Not on file  . Intimate partner violence:    Fear of current or ex partner: Not on file    Emotionally abused: Not on file    Physically abused: Not on file    Forced sexual activity: Not on file  Other Topics Concern  . Not on file  Social History Narrative   Divorced. Education: [employer].    Unable to exercise due to significant SOB and DOE.    Lives alone.   Son lives in [place].     Family History  Problem Relation Age of Onset  . Arthritis Mother   . COPD Mother   . Diabetes Mother        was thin, took insulin  . Hypertension Mother   . Hypertension Father   . Arthritis Sister        rheumatoid  . COPD Sister    . Diabetes Brother   . Colon cancer Neg Hx     ROS: [x]  Positive   [ ]  Negative   [ ]  All sytems reviewed and are negative  Cardiovascular: []  chest pain/pressure []  palpitations []  SOB lying flat [x]  DOE []  pain in legs while walking []  pain in legs at rest []  pain in legs at night []  non-healing ulcers []  hx of DVT []  swelling in legs  Pulmonary: []  productive cough []  asthma/wheezing []  home O2  Neurologic: []  weakness in []   arms []  legs []  numbness in []  arms []  legs []  hx of CVA []  mini stroke [] difficulty speaking or slurred speech []  temporary loss of vision in one eye []  dizziness  Hematologic: []  hx of cancer []  bleeding problems []  problems with blood clotting easily  Endocrine:   []  diabetes []  thyroid disease  GI []  vomiting blood []  blood in stool  GU: []  CKD/renal failure []  HD--[]  M/W/F or []  T/T/S []  burning with urination []  blood in urine  Psychiatric: []  anxiety []  depression  Musculoskeletal: []  arthritis []  joint pain  Integumentary: []  rashes []  ulcers  Constitutional: []  fever []  chills   Physical Examination  Vitals:   05/24/18 0820 05/24/18 1202  BP: (!) 182/98 (!) 162/79  Pulse: 74 73  Resp: 18 19  Temp: 98.9 F (37.2 C) 98.7 F (37.1 C)  SpO2: 98% 98%   Body mass index is 26.32 kg/m.  General:  NAD Gait: Not observed HENT: WNL, normocephalic Pulmonary: normal non-labored breathing Cardiac: regular Abdomen: soft, NT/ND, no masses Vascular Exam/Pulses: 2+ radial and brachial pulses palpable bilateral upper extremity. Extremities: without ischemic changes, without Gangrene , without cellulitis; without open wounds;  Musculoskeletal: no muscle wasting or atrophy  Neurologic: A&O X 3; Appropriate Affect ; SENSATION: normal; MOTOR FUNCTION:  moving all extremities equally. Speech is fluent/normal   CBC    Component Value Date/Time   WBC 7.9 05/24/2018 0411   RBC 3.60 (L) 05/24/2018 0411   HGB 9.2  (L) 05/24/2018 0411   HGB 12.5 (L) 03/21/2017 1124   HCT 27.1 (L) 05/24/2018 0411   HCT 38.7 03/21/2017 1124   PLT 293 05/24/2018 0411   PLT 283 03/21/2017 1124   MCV 75.3 (L) 05/24/2018 0411   MCV 81 03/21/2017 1124   MCH 25.6 (L) 05/24/2018 0411   MCHC 33.9 05/24/2018 0411   RDW 14.8 05/24/2018 0411   RDW 15.3 03/21/2017 1124   LYMPHSABS 1.1 05/22/2018 2016   LYMPHSABS 1.1 03/21/2017 1124   MONOABS 1.1 (H) 05/22/2018 2016   EOSABS 0.2 05/22/2018 2016   EOSABS 0.2 03/21/2017 1124   BASOSABS 0.1 05/22/2018 2016   BASOSABS 0.1 03/21/2017 1124    BMET    Component Value Date/Time   NA 139 05/24/2018 0411   NA 142 03/21/2017 1124   K 3.5 05/24/2018 0411   CL 107 05/24/2018 0411   CO2 22 05/24/2018 0411   GLUCOSE 88 05/24/2018 0411   BUN 40 (H) 05/24/2018 0411   BUN 25 (H) 03/21/2017 1124   CREATININE 4.60 (H) 05/24/2018 0411   CREATININE 1.45 (H) 07/26/2015 0919   CALCIUM 8.3 (L) 05/24/2018 0411   GFRNONAA 13 (L) 05/24/2018 0411   GFRAA 15 (L) 05/24/2018 0411    COAGS: Lab Results  Component Value Date   INR 0.97 06/10/2015     Non-Invasive Vascular Imaging:    Vein mapping pending  ASSESSMENT/PLAN: This is a 58 y.o. male that presented with AKI on CKD stage IV that nephrology has requested permanent dialysis access.  Please restrict the left arm given that he is right arm dominant.  He will need vein mapping which has been ordered.  I will follow-up with him tomorrow to make operative plans pending vein mapping results.  Marty Heck, MD [facility] [place] Office: [phone] Pager: [place]

## 2018-05-25 ENCOUNTER — Inpatient Hospital Stay (HOSPITAL_COMMUNITY): Payer: Managed Care, Other (non HMO)

## 2018-05-25 DIAGNOSIS — N184 Chronic kidney disease, stage 4 (severe): Secondary | ICD-10-CM

## 2018-05-25 DIAGNOSIS — R7989 Other specified abnormal findings of blood chemistry: Secondary | ICD-10-CM

## 2018-05-25 LAB — BASIC METABOLIC PANEL
Anion gap: 9 (ref 5–15)
BUN: 45 mg/dL — ABNORMAL HIGH (ref 6–20)
CO2: 23 mmol/L (ref 22–32)
CREATININE: 4.74 mg/dL — AB (ref 0.61–1.24)
Calcium: 8 mg/dL — ABNORMAL LOW (ref 8.9–10.3)
Chloride: 106 mmol/L (ref 98–111)
GFR calc Af Amer: 15 mL/min — ABNORMAL LOW (ref >60)
GFR calc non Af Amer: 13 mL/min — ABNORMAL LOW (ref >60)
Glucose, Bld: 163 mg/dL — ABNORMAL HIGH (ref 70–99)
Potassium: 3.7 mmol/L (ref 3.5–5.1)
Sodium: 138 mmol/L (ref 135–145)

## 2018-05-25 LAB — GLUCOSE, CAPILLARY
Glucose-Capillary: 149 mg/dL — ABNORMAL HIGH (ref 70–99)
Glucose-Capillary: 153 mg/dL — ABNORMAL HIGH (ref 70–99)
Glucose-Capillary: 179 mg/dL — ABNORMAL HIGH (ref 70–99)
Glucose-Capillary: 178 mg/dL — ABNORMAL HIGH (ref 70–99)
Glucose-Capillary: 210 mg/dL — ABNORMAL HIGH (ref 70–99)

## 2018-05-25 LAB — CBC
HCT: 27.8 % — ABNORMAL LOW (ref 39.0–52.0)
Hemoglobin: 9 g/dL — ABNORMAL LOW (ref 13.0–17.0)
MCH: 24.6 pg — ABNORMAL LOW (ref 26.0–34.0)
MCHC: 32.4 g/dL (ref 30.0–36.0)
MCV: 76 fL — ABNORMAL LOW (ref 80.0–100.0)
PLATELETS: 285 10*3/uL (ref 150–400)
RBC: 3.66 MIL/uL — ABNORMAL LOW (ref 4.22–5.81)
RDW: 15.1 % (ref 11.5–15.5)
WBC: 7 10*3/uL (ref 4.0–10.5)
nRBC: 0 % (ref 0.0–0.2)

## 2018-05-25 MED ORDER — CEFAZOLIN SODIUM-DEXTROSE 2-4 GM/100ML-% IV SOLN
2.0000 g | INTRAVENOUS | Status: AC
  Administered 2018-05-26: 2 g via INTRAVENOUS
  Filled 2018-05-25: qty 100

## 2018-05-25 NOTE — Progress Notes (Signed)
VASCULAR LAB PRELIMINARY  PRELIMINARY  PRELIMINARY  PRELIMINARY  Bilateral lower extremity venous duplex completed.    Preliminary report:  There is no DVT or SVT noted in the bilateral lower extremities.   Dailin Sosnowski, RVT 05/25/2018, 10:58 AM

## 2018-05-25 NOTE — Progress Notes (Signed)
[facility] [place]  Subjective  - No complaints.   Objective 139/78 70 99.4 F (37.4 C) (Oral) 18 94%  Intake/Output Summary (Last 24 hours) at 05/25/2018 0849 Last data filed at 05/25/2018 0806 Gross per 24 hour  Intake 1090 ml  Output 4175 ml  Net -3085 ml    Palpable radial and brachial pulses bilateral upper extremities.  Laboratory Lab Results: Recent Labs    05/24/18 0411 05/25/18 0450  WBC 7.9 7.0  HGB 9.2* 9.0*  HCT 27.1* 27.8*  PLT 293 285   BMET Recent Labs    05/24/18 0411 05/25/18 0450  NA 139 138  K 3.5 3.7  CL 107 106  CO2 22 23  GLUCOSE 88 163*  BUN 40* 45*  CREATININE 4.60* 4.74*  CALCIUM 8.3* 8.0*    COAG Lab Results  Component Value Date   INR 0.97 06/10/2015   No results found for: PTT  Assessment/Planning:  Awaiting vein mapping that is scheduled today.  Will plan for left upper extremity AVF vs graft tomorrow if remains in hospital.  Please have NPO after midnight.  Restrict left upper extremity.   Marty Heck 05/25/2018 8:49 AM --

## 2018-05-25 NOTE — Progress Notes (Signed)
[facility] Progress Note    Assessment/ Plan:    58 y/o man with a history of DM HTN CHF HLD CKD IVsarcoidosis presenting with dyspnea. Seen by PCP recently with a positive d-dimer and lower ext swelling, noted to have an elevated BNP. He is presenting with worsening dyspnea + increased lower extremity edema over the past week but denies cp, PND, n/v/d. He is seen for his advanced CKD by Dr. Hollie Salk with [facility] and has an appt to see [facility] for a fistula on Tues.  He is on Lasix 40mg  BID at home. He denies NSAID use and only uses Tylenol for pain. He also denies any obstructive like symptoms, hematuria, rashes, h/a. He was also noted to be hypertensive in the ED with a systolic in the 102'T.  Creatinine 1.6 08/11/2015 -> 2 07/02/2016 -> 2.3 03/2017 -> 3.4-3.8 01/2018 -> 4's 05/20/2018 with >300 mg/dl glucose on u/a 05/02/2015 and 3.7 gms of proteinuria by UPC in 03/2017.  1. 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 He may have an acute component exacerbated by the elevated BP.  - Left arm is restricted - Agree with IV diuresis; transition to orals as tolerated. -  Appreciate Dr. Carlis Abbott [facility] seeing so promptly -> scheduled for permanent access placement tomorrow. Vein mapping done today. 2. Hypertensive urgency - ? Compliance, restart all home medications. Goal decrease ~30% in 1st 24hrs to maintain adequate cerebral perfusion. - Still with spikes in pressures 3. DM 4. DDimer - VQ scan per primary 5. Sarcoidosis 6. Anemia - requested iron panels  Subjective:   Feels that his breathing is much better. Denies f/c/n/v   Objective:   BP (!) 164/99 (BP Location: Right Arm)   Pulse 76   Temp 98 F (36.7 C) (Oral)   Resp 18   Ht 5\' 10"  (1.778 m)   Wt 80.2 kg   SpO2 98%   BMI 25.37 kg/m   Intake/Output Summary (Last 24 hours) at 05/25/2018 1225 Last data filed at 05/25/2018  0957 Gross per 24 hour  Intake 940 ml  Output 4425 ml  Net -3485 ml   Weight change: -4.404 kg  Physical Exam: General appearance:A&Ox3 Head:NCAT Resp:clear to auscultation bilaterally Chest wall:no tenderness Cardio:S1, S2 normal and S3 present RZ:NBVA, non-tender; bowel sounds normal; no masses, no organomegaly Extremities:edematr Pulses:2+ and symmetric  Imaging: X-ray Chest Pa And Lateral  Result Date: 05/24/2018 CLINICAL DATA:  58 year old male with CHF, shortness of breath for the past 2 weeks EXAM: CHEST - 2 VIEW COMPARISON:  Prior chest x-ray 05/22/2018 FINDINGS: Stable cardiomegaly. Atherosclerotic calcifications again noted in the transverse aorta. Similar degree of pulmonary vascular congestion without overt pulmonary edema. There are small bilateral pleural effusions as well as fluid tracking along the major and minor fissures. Mild dependent atelectasis. No evidence of pneumothorax or focal airspace consolidation. No acute osseous abnormality. IMPRESSION: 1. Cardiomegaly and pulmonary vascular congestion without overt edema. 2. Small bilateral pleural effusions and bibasilar atelectasis. 3.  Aortic Atherosclerosis (ICD10-170.0) Electronically Signed   By: Jacqulynn Cadet M.D.   On: 05/24/2018 09:22   Nm Pulmonary Perf And Vent  Result Date: 05/23/2018 CLINICAL DATA:  shortness of breath. Has had going for the last couple weeks. Has had shortness of breath. History of sarcoidosis. Cough no real sputum production. Does have history of CHF. Saw PCP and had positive d-dimer.  Has had some swelling in his legs to. Is diabetic. BNP elevated on 1/8 also. No chest pain. Has had a cough that is been mostly dry.Pt historyHTN, HLD, DM, CHF, acute on chronic kidney injury, cardiac cath 2017 EXAM: NUCLEAR MEDICINE VENTILATION - PERFUSION LUNG SCAN TECHNIQUE: Ventilation images were obtained in multiple projections using inhaled aerosol Tc-39m DTPA. Perfusion images were obtained  in multiple projections after intravenous injection of Tc-54m MAA. RADIOPHARMACEUTICALS:  30.6 mCi of Tc-69m DTPA aerosol inhalation and 4.23 mCi Tc21m MAA IV COMPARISON:  Companion chest radiograph from the previous day demonstrating small pleural effusions and borderline cardiomegaly FINDINGS: Ventilation: Somewhat heterogenous distribution with central deposition. No focal ventilation defect. Perfusion: No wedge shaped peripheral perfusion defects to suggest acute pulmonary embolism. IMPRESSION: Low likelihood ratio for pulmonary embolism. Electronically Signed   By: Lucrezia Europe M.D.   On: 05/23/2018 17:42   Vas [place] Lower Extremity Venous (dvt)  Result Date: 05/25/2018  Lower Venous Study Indications: Edema.  Performing Technologist: Sharion Dove RVS  Examination Guidelines: A complete evaluation includes B-mode imaging, spectral Doppler, color Doppler, and power Doppler as needed of all accessible portions of each vessel. Bilateral testing is considered an integral part of a complete examination. Limited examinations for reoccurring indications may be performed as noted.  Right Venous Findings: +---------+---------------+---------+-----------+----------+-------+          CompressibilityPhasicitySpontaneityPropertiesSummary +---------+---------------+---------+-----------+----------+-------+ CFV      Full           Yes      Yes                          +---------+---------------+---------+-----------+----------+-------+ SFJ      Full                                                 +---------+---------------+---------+-----------+----------+-------+ FV Prox  Full                                                 +---------+---------------+---------+-----------+----------+-------+ FV Mid   Full                                                 +---------+---------------+---------+-----------+----------+-------+ FV DistalFull                                                  +---------+---------------+---------+-----------+----------+-------+ PFV      Full                                                 +---------+---------------+---------+-----------+----------+-------+ POP      Full           Yes      Yes                          +---------+---------------+---------+-----------+----------+-------+  PTV      Full                                                 +---------+---------------+---------+-----------+----------+-------+ PERO     Full                                                 +---------+---------------+---------+-----------+----------+-------+  Left Venous Findings: +---------+---------------+---------+-----------+----------+-------+          CompressibilityPhasicitySpontaneityPropertiesSummary +---------+---------------+---------+-----------+----------+-------+ CFV      Full           Yes      Yes                          +---------+---------------+---------+-----------+----------+-------+ SFJ      Full                                                 +---------+---------------+---------+-----------+----------+-------+ FV Prox  Full                                                 +---------+---------------+---------+-----------+----------+-------+ FV Mid   Full                                                 +---------+---------------+---------+-----------+----------+-------+ FV DistalFull                                                 +---------+---------------+---------+-----------+----------+-------+ PFV      Full                                                 +---------+---------------+---------+-----------+----------+-------+ POP      Full           Yes      Yes                          +---------+---------------+---------+-----------+----------+-------+ PTV      Full                                                  +---------+---------------+---------+-----------+----------+-------+ PERO     Full                                                 +---------+---------------+---------+-----------+----------+-------+  Summary: Right: There is no evidence of deep vein thrombosis in the lower extremity. Left: There is no evidence of deep vein thrombosis in the lower extremity.  *See table(s) above for measurements and observations.    Preliminary    Vas [place] Upper Ext Vein Mapping (pre-op Avf)  Result Date: 05/25/2018 UPPER EXTREMITY VEIN MAPPING  Indications: Pre-op dialysis access. Comparison Study: None on file Performing Technologist: Sharion Dove RVS  Examination Guidelines: A complete evaluation includes B-mode imaging, spectral Doppler, color Doppler, and power Doppler as needed of all accessible portions of each vessel. Bilateral testing is considered an integral part of a complete examination. Limited examinations for reoccurring indications may be performed as noted. +-----------------+-------------+----------+--------+ Right Cephalic   Diameter (cm)Depth (cm)Findings +-----------------+-------------+----------+--------+ Prox upper arm       0.24        0.32            +-----------------+-------------+----------+--------+ Mid upper arm        0.32        0.31            +-----------------+-------------+----------+--------+ Dist upper arm       0.32        0.40            +-----------------+-------------+----------+--------+ Antecubital fossa    0.57        0.18            +-----------------+-------------+----------+--------+ Prox forearm         0.45        0.39            +-----------------+-------------+----------+--------+ Mid forearm          0.39        0.29            +-----------------+-------------+----------+--------+ Wrist                0.21        0.21            +-----------------+-------------+----------+--------+  +-----------------+-------------+----------+---------+ Left Cephalic    Diameter (cm)Depth (cm)Findings  +-----------------+-------------+----------+---------+ Prox upper arm       0.38        0.30             +-----------------+-------------+----------+---------+ Mid upper arm        0.36        0.32             +-----------------+-------------+----------+---------+ Dist upper arm       0.43        0.29             +-----------------+-------------+----------+---------+ Antecubital fossa    0.50        0.26             +-----------------+-------------+----------+---------+ Prox forearm         0.40        0.36   branching +-----------------+-------------+----------+---------+ Mid forearm          0.35        0.25             +-----------------+-------------+----------+---------+ Wrist                0.28        0.23             +-----------------+-------------+----------+---------+ *See table(s) above for measurements and observations.  Diagnosing physician:    Preliminary     Labs: BMET Recent Labs  Lab 05/20/18 1709 05/22/18 2016  05/23/18 0753 05/24/18 0411 05/25/18 0450  NA 138 137 138 139 138  K 4.5 3.9 3.8 3.5 3.7  CL 107 107 108 107 106  CO2 22 22 22 22 23   GLUCOSE 71 90 80 88 163*  BUN 45* 50* 44* 40* 45*  CREATININE 4.35* 4.73* 4.57* 4.60* 4.74*  CALCIUM 8.4 8.1* 8.2* 8.3* 8.0*   CBC Recent Labs  Lab 05/20/18 1709 05/22/18 2016 05/24/18 0411 05/25/18 0450  WBC 9.3 9.3 7.9 7.0  NEUTROABS 6.8 6.8  --   --   HGB 9.8* 9.0* 9.2* 9.0*  HCT 29.9* 28.6* 27.1* 27.8*  MCV 76.1* 76.9* 75.3* 76.0*  PLT 276.0 276 293 285    Medications:    . amLODipine  5 mg Oral Daily  . aspirin EC  81 mg Oral Daily  . carvedilol  6.25 mg Oral BID WC  . furosemide  80 mg Intravenous Q12H  . hydrALAZINE  50 mg Oral TID  . insulin aspart  0-9 Units Subcutaneous TID WC  . isosorbide mononitrate  30 mg Oral Daily  . rosuvastatin  40 mg Oral q1800  . sodium  chloride flush  3 mL Intravenous Q12H      Otelia Santee, MD 05/25/2018, 12:25 PM

## 2018-05-25 NOTE — Progress Notes (Addendum)
PROGRESS NOTE  Brandon Carroll  JGG:[id number] DOB: 1961/02/12 DOA: 05/22/2018 PCP: Hoyt Koch, MD   Brief Narrative: Brandon Carroll is a 58 y.o. male with a history of sarcoidosis, stage IV CKD, T2DM, HLD, HTN, and chronic diastolic CHF who presented for increasing shortness of breath, nonproductive cough, found to have a positive d-dimer and leg swelling. He had been treated empirically with prednisone for sarcoid flare, though this has not helped. and was sent to the ED for further evaluation. BNP found to be elevated and V/Q scan recommended for PE evaluation due to acute on chronic kidney failure. He was accepted for admission but waited at [facility] for a bed at [facility], ultimately being admitted to [facility] for nephrology and cardiology consultations for acute on chronic CHF. Echocardiogram demonstrated no severe abnormalities, preserved LV systolic function, so cardiology was not consulted. IV lasix given and his symptoms have improved somewhat. BP medications restarted with improvement in BP.  Assessment & Plan: Principal Problem:   Acute renal failure superimposed on stage 4 chronic kidney disease (HCC) Active Problems:   Hyperlipidemia associated with type 2 diabetes mellitus (HCC)   Diabetes mellitus with renal complications (HCC)   Dyspnea   Anemia   CKD (chronic kidney disease) stage 4, GFR 15-29 ml/min (HCC)   Sarcoidosis   Acute on chronic systolic heart failure (HCC)   Positive D dimer   Hypertensive urgency  AKI on stage IV CKD, progression to stage V CKD:  - Continues to have impressive diuresis, another >4L out/24hrs, down to 176lbs from 197lbs. This seems to be below recent weights, so unsure of what EDW will settle at. Continue IV diuresis. Relatively stable creatinine. - Monitor UOP, BMP - VVS consulted, vein mapping done today in anticipation of AVF vs. AVG of LUE 1/13. - No LV dysfunction, so stopped ACE  Acute on chronic diastolic CHF: Precipitated by worsening  renal function most likely. BNP 1449. - Diuresis as above - Monitor daily weights, strict I/O  HTN urgency: ?nonadherence as BP improved with restarting home medications - Restarted home imdur, hydralazine, norvasc. Caution to avoid overly rapid correction. - 139/78 this AM, NSR 69bpm. Will likely restart coreg in PM if BP not low.  Fever: Isolated overnight with actual decrease in WBC 9 > 7.9 > 7. Has hovered around 99+/-F but no localizing symptoms of infection, or sarcoid flare. No infiltrate on CXR, negative UA. No DVT's noted on U/S UE LE's, low risk V/Q. - Blood cultures NGTD, continue monitoring.   - Incentive spirometry  Anemia of chronic kidney disease: Hgb stable at 9. - Per nephrology  T2DM: HbA1c 6.9%. - Hold/discontinue metformin - SSI  HLD:  - Continue statin  Sarcoidosis: No hx flare/complication to date. No indication for steroids currently.   Positive d-dimer: Low probability V/Q scan, DC'ed heparin.   DVT prophylaxis: Heparin Code Status: Full Family Communication: Fiance at bedside Disposition Plan: Home once cleared by nephrology/euvolemic. Planning AVF vs AVG 1/13.  Consultants:   Nephrology  VVS  Procedures:   None  Antimicrobials:  None   Subjective: Overall improved, losing a lot of water weight, still feels legs are swollen from baseline and having decreased exercise capacity from baseline due to dyspnea. No chest pain. Denies cough, abd pain, N/V/D, dysuria, wounds.  Objective: Vitals:   05/24/18 1710 05/24/18 1937 05/25/18 0007 05/25/18 0418  BP: (!) 175/90 (!) 153/82 (!) 160/80 139/78  Pulse: 76 75 70 70  Resp: 19 18 18 18   Temp: 99 F (  37.2 C) 99.5 F (37.5 C) 99.7 F (37.6 C) 99.4 F (37.4 C)  TempSrc: Oral Oral Oral Oral  SpO2: 99% 95% 96% 94%  Weight:    80.2 kg  Height:        Intake/Output Summary (Last 24 hours) at 05/25/2018 1143 Last data filed at 05/25/2018 0957 Gross per 24 hour  Intake 940 ml  Output 5075 ml    Net -4135 ml   Filed Weights   05/23/18 1254 05/24/18 0033 05/25/18 0418  Weight: 84.6 kg 83.2 kg 80.2 kg   Gen: 58 y.o. male in no distress Pulm: Nonlabored breathing room air. Clear. CV: Regular rate and rhythm. No murmur, rub, or gallop. No JVD, trace-to-1+ LE dependent edema. GI: Abdomen soft, non-tender, non-distended, with normoactive bowel sounds.  Ext: Warm, no deformities Skin: No rashes, lesions or ulcers on visualized skin. Neuro: Alert and oriented. No focal neurological deficits. Psych: Judgement and insight appear fair. Mood euthymic & affect congruent. Behavior is appropriate.    Data Reviewed: I have personally reviewed following labs and imaging studies  CBC: Recent Labs  Lab 05/20/18 1709 05/22/18 2016 05/24/18 0411 05/25/18 0450  WBC 9.3 9.3 7.9 7.0  NEUTROABS 6.8 6.8  --   --   HGB 9.8* 9.0* 9.2* 9.0*  HCT 29.9* 28.6* 27.1* 27.8*  MCV 76.1* 76.9* 75.3* 76.0*  PLT 276.0 276 293 462   Basic Metabolic Panel: Recent Labs  Lab 05/20/18 1709 05/22/18 2016 05/23/18 0753 05/24/18 0411 05/25/18 0450  NA 138 137 138 139 138  K 4.5 3.9 3.8 3.5 3.7  CL 107 107 108 107 106  CO2 22 22 22 22 23   GLUCOSE 71 90 80 88 163*  BUN 45* 50* 44* 40* 45*  CREATININE 4.35* 4.73* 4.57* 4.60* 4.74*  CALCIUM 8.4 8.1* 8.2* 8.3* 8.0*  MG  --   --   --  2.0  --    GFR: Estimated Creatinine Clearance: 18.2 mL/min (A) (by C-G formula based on SCr of 4.74 mg/dL (H)). Liver Function Tests: No results for input(s): AST, ALT, ALKPHOS, BILITOT, PROT, ALBUMIN in the last 168 hours. No results for input(s): LIPASE, AMYLASE in the last 168 hours. No results for input(s): AMMONIA in the last 168 hours. Coagulation Profile: No results for input(s): INR, PROTIME in the last 168 hours. Cardiac Enzymes: Recent Labs  Lab 05/22/18 2016 05/23/18 0753  TROPONINI 0.08* 0.07*   BNP (last 3 results) Recent Labs    02/07/18 1601 05/20/18 1709  PROBNP 478.0* 1,460.0*    HbA1C: Recent Labs    05/23/18 2220  HGBA1C 6.9*   CBG: Recent Labs  Lab 05/23/18 2109 05/24/18 0816 05/24/18 1200 05/24/18 2122 05/25/18 0733  GLUCAP 91 87 135* 141* 153*   Lipid Profile: No results for input(s): CHOL, HDL, LDLCALC, TRIG, CHOLHDL, LDLDIRECT in the last 72 hours. Thyroid Function Tests: No results for input(s): TSH, T4TOTAL, FREET4, T3FREE, THYROIDAB in the last 72 hours. Anemia Panel: No results for input(s): VITAMINB12, FOLATE, FERRITIN, TIBC, IRON, RETICCTPCT in the last 72 hours. Urine analysis:    Component Value Date/Time   COLORURINE STRAW (A) 05/24/2018 1216   APPEARANCEUR CLEAR 05/24/2018 1216   APPEARANCEUR Clear 03/21/2017 1124   LABSPEC 1.010 05/24/2018 1216   PHURINE 7.0 05/24/2018 1216   GLUCOSEU 50 (A) 05/24/2018 1216   HGBUR SMALL (A) 05/24/2018 1216   BILIRUBINUR NEGATIVE 05/24/2018 1216   BILIRUBINUR Negative 03/21/2017 1124   [place] 05/24/2018 1216   PROTEINUR 100 (A) 05/24/2018  1216   UROBILINOGEN 0.2 08/20/2011 1801   UROBILINOGEN 1.0 09/11/2008 1120   NITRITE NEGATIVE 05/24/2018 1216   LEUKOCYTESUR NEGATIVE 05/24/2018 1216   LEUKOCYTESUR Negative 03/21/2017 1124   Recent Results (from the past 240 hour(s))  Culture, blood (routine x 2)     Status: None (Preliminary result)   Collection Time: 05/24/18  8:31 AM  Result Value Ref Range Status   Specimen Description BLOOD RIGHT HAND  Final   Special Requests   Final    BOTTLES DRAWN AEROBIC ONLY Blood Culture adequate volume   Culture   Final    NO GROWTH < 24 HOURS Performed at [facility], [place] [address] [place], [place] [address]    Report Status PENDING  Incomplete  Culture, blood (routine x 2)     Status: None (Preliminary result)   Collection Time: 05/24/18  8:37 AM  Result Value Ref Range Status   Specimen Description BLOOD RIGHT HAND  Final   Special Requests   Final    BOTTLES DRAWN AEROBIC ONLY Blood Culture adequate volume   Culture    Final    NO GROWTH < 24 HOURS Performed at [facility], [place] [address] [place], [place] [address]    Report Status PENDING  Incomplete      Radiology Studies: X-ray Chest Pa And Lateral  Result Date: 05/24/2018 CLINICAL DATA:  58 year old male with CHF, shortness of breath for the past 2 weeks EXAM: CHEST - 2 VIEW COMPARISON:  Prior chest x-ray 05/22/2018 FINDINGS: Stable cardiomegaly. Atherosclerotic calcifications again noted in the transverse aorta. Similar degree of pulmonary vascular congestion without overt pulmonary edema. There are small bilateral pleural effusions as well as fluid tracking along the major and minor fissures. Mild dependent atelectasis. No evidence of pneumothorax or focal airspace consolidation. No acute osseous abnormality. IMPRESSION: 1. Cardiomegaly and pulmonary vascular congestion without overt edema. 2. Small bilateral pleural effusions and bibasilar atelectasis. 3.  Aortic Atherosclerosis (ICD10-170.0) Electronically Signed   By: Jacqulynn Cadet M.D.   On: 05/24/2018 09:22   Nm Pulmonary Perf And Vent  Result Date: 05/23/2018 CLINICAL DATA:  shortness of breath. Has had going for the last couple weeks. Has had shortness of breath. History of sarcoidosis. Cough no real sputum production. Does have history of CHF. Saw PCP and had positive d-dimer. Has had some swelling in his legs to. Is diabetic. BNP elevated on 1/8 also. No chest pain. Has had a cough that is been mostly dry.Pt historyHTN, HLD, DM, CHF, acute on chronic kidney injury, cardiac cath 2017 EXAM: NUCLEAR MEDICINE VENTILATION - PERFUSION LUNG SCAN TECHNIQUE: Ventilation images were obtained in multiple projections using inhaled aerosol Tc-48m DTPA. Perfusion images were obtained in multiple projections after intravenous injection of Tc-75m MAA. RADIOPHARMACEUTICALS:  30.6 mCi of Tc-29m DTPA aerosol inhalation and 4.23 mCi Tc27m MAA IV COMPARISON:  Companion chest radiograph from the previous day  demonstrating small pleural effusions and borderline cardiomegaly FINDINGS: Ventilation: Somewhat heterogenous distribution with central deposition. No focal ventilation defect. Perfusion: No wedge shaped peripheral perfusion defects to suggest acute pulmonary embolism. IMPRESSION: Low likelihood ratio for pulmonary embolism. Electronically Signed   By: Lucrezia Europe M.D.   On: 05/23/2018 17:42    Scheduled Meds: . amLODipine  5 mg Oral Daily  . aspirin EC  81 mg Oral Daily  . carvedilol  6.25 mg Oral BID WC  . furosemide  80 mg Intravenous Q12H  . hydrALAZINE  50 mg Oral TID  .  insulin aspart  0-9 Units Subcutaneous TID WC  . isosorbide mononitrate  30 mg Oral Daily  . rosuvastatin  40 mg Oral q1800  . sodium chloride flush  3 mL Intravenous Q12H   Continuous Infusions: . [START ON 05/26/2018]  ceFAZolin (ANCEF) IV       LOS: 3 days   Time spent: 25 minutes.  Patrecia Pour, MD [facility] [email or web address] Password [facility] 05/25/2018, 11:43 AM

## 2018-05-25 NOTE — Progress Notes (Signed)
VASCULAR LAB PRELIMINARY  PRELIMINARY  PRELIMINARY  PRELIMINARY  Upper extremity vein mapping completed.    Preliminary report:      Dewon Mendizabal, RVT 05/25/2018, 11:06 AM

## 2018-05-26 ENCOUNTER — Encounter (HOSPITAL_COMMUNITY)
Admission: EM | Disposition: A | Discharge status: Home / Self Care | Payer: Self-pay | Source: Home / Self Care | Attending: Family Medicine

## 2018-05-26 ENCOUNTER — Encounter (HOSPITAL_COMMUNITY): Payer: Self-pay | Admitting: Anesthesiology

## 2018-05-26 ENCOUNTER — Telehealth: Payer: Self-pay | Admitting: Vascular Surgery

## 2018-05-26 ENCOUNTER — Institutional Professional Consult (permissible substitution): Payer: Managed Care, Other (non HMO) | Admitting: Pulmonary Disease

## 2018-05-26 ENCOUNTER — Inpatient Hospital Stay (HOSPITAL_COMMUNITY): Payer: Managed Care, Other (non HMO) | Admitting: Anesthesiology

## 2018-05-26 HISTORY — PX: AV FISTULA PLACEMENT: SHX1204

## 2018-05-26 LAB — GLUCOSE, CAPILLARY
Glucose-Capillary: 138 mg/dL — ABNORMAL HIGH (ref 70–99)
Glucose-Capillary: 385 mg/dL — ABNORMAL HIGH (ref 70–99)
Glucose-Capillary: 307 mg/dL — ABNORMAL HIGH (ref 70–99)
Glucose-Capillary: 346 mg/dL — ABNORMAL HIGH (ref 70–99)

## 2018-05-26 LAB — BASIC METABOLIC PANEL
Anion gap: 10 (ref 5–15)
BUN: 48 mg/dL — ABNORMAL HIGH (ref 6–20)
CO2: 23 mmol/L (ref 22–32)
Calcium: 8.1 mg/dL — ABNORMAL LOW (ref 8.9–10.3)
Chloride: 103 mmol/L (ref 98–111)
Creatinine, Ser: 4.47 mg/dL — ABNORMAL HIGH (ref 0.61–1.24)
GFR calc Af Amer: 16 mL/min — ABNORMAL LOW (ref >60)
GFR calc non Af Amer: 14 mL/min — ABNORMAL LOW (ref >60)
Glucose, Bld: 178 mg/dL — ABNORMAL HIGH (ref 70–99)
Potassium: 3.7 mmol/L (ref 3.5–5.1)
Sodium: 136 mmol/L (ref 135–145)

## 2018-05-26 LAB — CBC
HCT: 30.6 % — ABNORMAL LOW (ref 39.0–52.0)
Hemoglobin: 9.9 g/dL — ABNORMAL LOW (ref 13.0–17.0)
MCH: 24.5 pg — AB (ref 26.0–34.0)
MCHC: 32.4 g/dL (ref 30.0–36.0)
MCV: 75.7 fL — ABNORMAL LOW (ref 80.0–100.0)
Platelets: 302 10*3/uL (ref 150–400)
RBC: 4.04 MIL/uL — ABNORMAL LOW (ref 4.22–5.81)
RDW: 15.2 % (ref 11.5–15.5)
WBC: 7.4 10*3/uL (ref 4.0–10.5)
nRBC: 0 % (ref 0.0–0.2)

## 2018-05-26 LAB — SURGICAL PCR SCREEN
MRSA, PCR: NEGATIVE
Staphylococcus aureus: POSITIVE — AB

## 2018-05-26 SURGERY — ARTERIOVENOUS (AV) FISTULA CREATION
Anesthesia: Monitor Anesthesia Care | Laterality: Left

## 2018-05-26 MED ORDER — HYDROCODONE-ACETAMINOPHEN 5-325 MG PO TABS: 1.0000 | ORAL_TABLET | ORAL | Status: DC | PRN

## 2018-05-26 MED ORDER — CHLORHEXIDINE GLUCONATE CLOTH 2 % EX PADS
6.0000 | MEDICATED_PAD | Freq: Every day | CUTANEOUS | Status: DC
  Administered 2018-05-26: 6 via TOPICAL

## 2018-05-26 MED ORDER — DEXAMETHASONE SODIUM PHOSPHATE 10 MG/ML IJ SOLN
INTRAMUSCULAR | Status: DC | PRN
  Administered 2018-05-26: 10 mg via INTRAVENOUS

## 2018-05-26 MED ORDER — INSULIN ASPART 100 UNIT/ML ~~LOC~~ SOLN
6.0000 [IU] | Freq: Once | SUBCUTANEOUS | Status: AC
  Administered 2018-05-26: 6 [IU] via SUBCUTANEOUS

## 2018-05-26 MED ORDER — PROPOFOL 10 MG/ML IV BOLUS
INTRAVENOUS | Status: DC | PRN
  Administered 2018-05-26: 20 mg via INTRAVENOUS

## 2018-05-26 MED ORDER — LIDOCAINE HCL (CARDIAC) PF 100 MG/5ML IV SOSY
PREFILLED_SYRINGE | INTRAVENOUS | Status: DC | PRN
  Administered 2018-05-26: 20 mg via INTRATRACHEAL

## 2018-05-26 MED ORDER — AMLODIPINE BESYLATE 10 MG PO TABS
10.0000 mg | ORAL_TABLET | Freq: Every day | ORAL | Status: DC
  Administered 2018-05-26: 10 mg via ORAL
  Filled 2018-05-26: qty 1

## 2018-05-26 MED ORDER — ONDANSETRON HCL 4 MG/2ML IJ SOLN: 4.0000 mg | Freq: Once | INTRAMUSCULAR | Status: DC | PRN

## 2018-05-26 MED ORDER — PROPOFOL 500 MG/50ML IV EMUL
INTRAVENOUS | Status: DC | PRN
  Administered 2018-05-26: 75 ug/kg/min via INTRAVENOUS

## 2018-05-26 MED ORDER — FENTANYL CITRATE (PF) 100 MCG/2ML IJ SOLN: 25.0000 ug | INTRAMUSCULAR | Status: DC | PRN

## 2018-05-26 MED ORDER — LIDOCAINE HCL 1 % IJ SOLN
INTRAMUSCULAR | Status: DC | PRN
  Administered 2018-05-26: 5 mL

## 2018-05-26 MED ORDER — DARBEPOETIN ALFA 60 MCG/0.3ML IJ SOSY
60.0000 ug | PREFILLED_SYRINGE | Freq: Once | INTRAMUSCULAR | Status: AC
  Administered 2018-05-26: 60 ug via SUBCUTANEOUS
  Filled 2018-05-26: qty 0.3

## 2018-05-26 MED ORDER — ONDANSETRON HCL 4 MG/2ML IJ SOLN
INTRAMUSCULAR | Status: DC | PRN
  Administered 2018-05-26: 4 mg via INTRAVENOUS

## 2018-05-26 MED ORDER — SODIUM CHLORIDE 0.9 % IV SOLN
INTRAVENOUS | Status: DC | PRN
  Administered 2018-05-26: 07:00:00 via INTRAVENOUS

## 2018-05-26 MED ORDER — PROPOFOL 10 MG/ML IV BOLUS
INTRAVENOUS | Status: AC
  Filled 2018-05-26: qty 20

## 2018-05-26 MED ORDER — OXYCODONE HCL 5 MG PO TABS: 5.0000 mg | ORAL_TABLET | Freq: Once | ORAL | Status: DC | PRN

## 2018-05-26 MED ORDER — FENTANYL CITRATE (PF) 250 MCG/5ML IJ SOLN
INTRAMUSCULAR | Status: AC
  Filled 2018-05-26: qty 5

## 2018-05-26 MED ORDER — SODIUM CHLORIDE 0.9 % IV SOLN
INTRAVENOUS | Status: AC
  Filled 2018-05-26: qty 1.2

## 2018-05-26 MED ORDER — 0.9 % SODIUM CHLORIDE (POUR BTL) OPTIME
TOPICAL | Status: DC | PRN
  Administered 2018-05-26: 1000 mL

## 2018-05-26 MED ORDER — HEPARIN SODIUM (PORCINE) 1000 UNIT/ML IJ SOLN
INTRAMUSCULAR | Status: DC | PRN
  Administered 2018-05-26: 3000 [IU] via INTRAVENOUS

## 2018-05-26 MED ORDER — MIDAZOLAM HCL 2 MG/2ML IJ SOLN
INTRAMUSCULAR | Status: AC
  Filled 2018-05-26: qty 2

## 2018-05-26 MED ORDER — FENTANYL CITRATE (PF) 100 MCG/2ML IJ SOLN
INTRAMUSCULAR | Status: DC | PRN
  Administered 2018-05-26 (×2): 25 ug via INTRAVENOUS

## 2018-05-26 MED ORDER — AMLODIPINE BESYLATE 10 MG PO TABS: 10.0000 mg | ORAL_TABLET | Freq: Every day | ORAL | Status: DC

## 2018-05-26 MED ORDER — SODIUM CHLORIDE 0.9 % IV SOLN
INTRAVENOUS | Status: DC | PRN
  Administered 2018-05-26: 07:00:00

## 2018-05-26 MED ORDER — HYDROCODONE-ACETAMINOPHEN 5-325 MG PO TABS: 1.0000 | ORAL_TABLET | Freq: Four times a day (QID) | ORAL | 0 refills | Status: DC | PRN

## 2018-05-26 MED ORDER — OXYCODONE HCL 5 MG/5ML PO SOLN: 5.0000 mg | Freq: Once | ORAL | Status: DC | PRN

## 2018-05-26 MED ORDER — MIDAZOLAM HCL 5 MG/5ML IJ SOLN
INTRAMUSCULAR | Status: DC | PRN
  Administered 2018-05-26: 2 mg via INTRAVENOUS

## 2018-05-26 MED ORDER — FUROSEMIDE 80 MG PO TABS
160.0000 mg | ORAL_TABLET | Freq: Two times a day (BID) | ORAL | Status: DC
  Administered 2018-05-26 – 2018-05-27 (×2): 160 mg via ORAL
  Filled 2018-05-26 (×2): qty 2

## 2018-05-26 MED ORDER — LIDOCAINE HCL (PF) 1 % IJ SOLN
INTRAMUSCULAR | Status: AC
  Filled 2018-05-26: qty 30

## 2018-05-26 MED ORDER — MUPIROCIN 2 % EX OINT
1.0000 "application " | TOPICAL_OINTMENT | Freq: Two times a day (BID) | CUTANEOUS | Status: DC
  Administered 2018-05-26 – 2018-05-27 (×4): 1 via NASAL
  Filled 2018-05-26: qty 22

## 2018-05-26 SURGICAL SUPPLY — 38 items
ARMBAND PINK RESTRICT EXTREMIT (MISCELLANEOUS) ×3 IMPLANT
CANISTER SUCT 3000ML PPV (MISCELLANEOUS) ×3 IMPLANT
CLIP VESOCCLUDE MED 6/CT (CLIP) ×3 IMPLANT
CLIP VESOCCLUDE SM WIDE 6/CT (CLIP) ×3 IMPLANT
COVER PROBE W GEL 5X96 (DRAPES) ×3 IMPLANT
COVER WAND RF STERILE (DRAPES) IMPLANT
DECANTER SPIKE VIAL GLASS SM (MISCELLANEOUS) ×3 IMPLANT
DERMABOND ADVANCED (GAUZE/BANDAGES/DRESSINGS) ×2
DERMABOND ADVANCED .7 DNX12 (GAUZE/BANDAGES/DRESSINGS) ×1 IMPLANT
ELECT REM PT RETURN 9FT ADLT (ELECTROSURGICAL) ×3
ELECTRODE REM PT RTRN 9FT ADLT (ELECTROSURGICAL) ×1 IMPLANT
GLOVE BIO SURGEON STRL SZ 6.5 (GLOVE) ×2 IMPLANT
GLOVE BIO SURGEON STRL SZ7.5 (GLOVE) ×6 IMPLANT
GLOVE BIO SURGEONS STRL SZ 6.5 (GLOVE) ×1
GLOVE BIOGEL PI IND STRL 6.5 (GLOVE) ×1 IMPLANT
GLOVE BIOGEL PI IND STRL 8 (GLOVE) ×1 IMPLANT
GLOVE BIOGEL PI INDICATOR 6.5 (GLOVE) ×2
GLOVE BIOGEL PI INDICATOR 8 (GLOVE) ×2
GLOVE SURG SS PI 6.0 STRL IVOR (GLOVE) ×3 IMPLANT
GLOVE SURG SS PI 6.5 STRL IVOR (GLOVE) ×6 IMPLANT
GOWN STRL REUS W/ TWL LRG LVL3 (GOWN DISPOSABLE) ×2 IMPLANT
GOWN STRL REUS W/ TWL XL LVL3 (GOWN DISPOSABLE) ×1 IMPLANT
GOWN STRL REUS W/TWL LRG LVL3 (GOWN DISPOSABLE) ×4
GOWN STRL REUS W/TWL XL LVL3 (GOWN DISPOSABLE) ×2
HEMOSTAT SPONGE AVITENE ULTRA (HEMOSTASIS) IMPLANT
KIT BASIN OR (CUSTOM PROCEDURE TRAY) ×3 IMPLANT
KIT TURNOVER KIT B (KITS) ×3 IMPLANT
NS IRRIG 1000ML POUR BTL (IV SOLUTION) ×3 IMPLANT
PACK CV ACCESS (CUSTOM PROCEDURE TRAY) ×3 IMPLANT
PAD ARMBOARD 7.5X6 YLW CONV (MISCELLANEOUS) ×6 IMPLANT
SUT MNCRL AB 4-0 PS2 18 (SUTURE) ×3 IMPLANT
SUT PROLENE 6 0 BV (SUTURE) ×9 IMPLANT
SUT PROLENE 7 0 BV 1 (SUTURE) IMPLANT
SUT VIC AB 3-0 SH 27 (SUTURE) ×2
SUT VIC AB 3-0 SH 27X BRD (SUTURE) ×1 IMPLANT
TOWEL GREEN STERILE (TOWEL DISPOSABLE) ×3 IMPLANT
UNDERPAD 30X30 (UNDERPADS AND DIAPERS) ×3 IMPLANT
WATER STERILE IRR 1000ML POUR (IV SOLUTION) ×3 IMPLANT

## 2018-05-26 NOTE — Op Note (Signed)
OPERATIVE NOTE   PROCEDURE: left radiocephaic arteriovenous fistula placement  PRE-OPERATIVE DIAGNOSIS: CKD  POST-OPERATIVE DIAGNOSIS: same  SURGEON: Monica Martinez, MD  ASSISTANT(S): Arlee Muslim, PA  ANESTHESIA: MAC  ESTIMATED BLOOD LOSS: Minimal  FINDING(S): 1.  Cephalic vein: 3.0 mm, acceptable 2.  Radial artery: 2.5 mm, atherosclerotic disease evident 3.  Venous outflow: palpable thrill  4.  Radial flow: palpable radial pulse  SPECIMEN(S):  none  INDICATIONS:   Brandon Carroll is a 58 y.o. male who presents with CKD and need for permanent hemodialysis access.  The patient is scheduled for left radiocephalic arteriovenous fistula placement.  The patient is aware the risks include but are not limited to: bleeding, infection, steal syndrome, nerve damage, ischemic monomelic neuropathy, failure to mature, and need for additional procedures.  The patient is aware of the risks of the procedure and elects to proceed forward.  DESCRIPTION: After full informed written consent was obtained from the patient, the patient was brought back to the operating room and placed supine upon the operating table.  Prior to induction, the patient received IV antibiotics.   After obtaining adequate anesthesia, the patient was then prepped and draped in the standard fashion for a left arm access procedure.   I turned my attention first to identifying the patient's distal cephalic vein and radial artery.  Using SonoSite guidance, the location of these vessels were marked out on the skin.   I marked out these vessels in the mid to distal forearm where the vein appeared larger.  At this point, I injected local anesthetic to obtain a field block of the wrist.  In total, I injected about 10 mL of 1% lidocaine without epinephrine.  I made a longitudnal incision at the level just above the wrist and dissected through the subcutaneous tissue and fascia to gain exposure of the radial artery.  This was  noted to be 2.5 mm in diameter externally.  This was dissected out proximally and distally and controlled with vessel loops .  I then dissected out the cephalic vein.  This was noted to be 3.0 mm in diameter externally.  The distal segment of the vein was ligated with a  3-0 silk at a branch point and the vein was transected.  The proximal segment was interrogated with serial dilators.  The vein accepted up to a 3 mm dilator without any difficulty.  I then instilled the heparinized saline into the vein and clamped it.  At this point, I reset my exposure of the radial artery and placed the artery under tension proximally and distally.  I made an arteriotomy with a #11 blade, and then I extended the arteriotomy with a Potts scissor.  I injected heparinized saline proximal and distal to this arteriotomy.  The vein was then sewn to the artery in an end-to-side configuration after spatulating with a running stitch of 6-0 Prolene.  Prior to completing this anastomosis, I allowed the vein and artery to backbleed.  There was no evidence of clot from any vessels.  I completed the anastomosis in the usual fashion and then released all vessel loops and clamps.    There was a palpable thrill in the venous outflow, and there was a palpable radial pulse.  At this point, I irrigated out the surgical wound.  There was no further active bleeding.  The subcutaneous tissue was reapproximated with a running stitch of 3-0 Vicryl.  The skin was then reapproximated with a running subcuticular stitch of 4-0 Monocryl.  The skin was then cleaned, dried, and reinforced with Dermabond.  The patient tolerated this procedure well.   COMPLICATIONS: None  CONDITION: Stable   Monica Martinez, MD [facility] Office: [phone] Pager: [phone]  05/26/2018, 9:03 AM

## 2018-05-26 NOTE — Telephone Encounter (Signed)
sch appt lvm mld ltr 07/08/2018 1pm Dialysis duole 145pm p/o M D

## 2018-05-26 NOTE — Telephone Encounter (Signed)
-----   Message from Dagoberto Ligas, PA-C sent at 05/26/2018  8:56 AM EST -----  Can you schedule an appt for this pt in about 4-6 weeks with fistula duplex for Dr. Carlis Abbott.  PO L radiocephalic fistula. Thanks, [facility]

## 2018-05-26 NOTE — Transfer of Care (Signed)
Immediate Anesthesia Transfer of Care Note  Patient: Brandon Carroll  Procedure(s) Performed: ARTERIOVENOUS (AV) FISTULA CREATION LEFT ARM (Left )  Patient Location: PACU  Anesthesia Type:MAC  Level of Consciousness: awake, alert  and oriented  Airway & Oxygen Therapy: Patient Spontanous Breathing  Post-op Assessment: Report given to RN, Post -op Vital signs reviewed and stable and Patient moving all extremities X 4  Post vital signs: Reviewed and stable  Last Vitals:  Vitals Value Taken Time  BP 145/87 05/26/2018  9:05 AM  Temp 36.6 C 05/26/2018  9:04 AM  Pulse 70 05/26/2018  9:06 AM  Resp 20 05/26/2018  9:06 AM  SpO2 98 % 05/26/2018  9:06 AM  Vitals shown include unvalidated device data.  Last Pain:  Vitals:   05/26/18 0520  TempSrc: Oral  PainSc:          Complications: No apparent anesthesia complications

## 2018-05-26 NOTE — Anesthesia Postprocedure Evaluation (Signed)
Anesthesia Post Note  Patient: Czar Macintyre  Procedure(s) Performed: ARTERIOVENOUS (AV) FISTULA CREATION LEFT ARM (Left )     Patient location during evaluation: PACU Anesthesia Type: MAC Level of consciousness: awake and alert Pain management: pain level controlled Vital Signs Assessment: post-procedure vital signs reviewed and stable Respiratory status: spontaneous breathing, nonlabored ventilation, respiratory function stable and patient connected to nasal cannula oxygen Cardiovascular status: stable and blood pressure returned to baseline Postop Assessment: no apparent nausea or vomiting Anesthetic complications: no    Last Vitals:  Vitals:   05/26/18 0935 05/26/18 1148  BP: (!) 150/85 (!) 146/83  Pulse: 70 81  Resp: 16 17  Temp: 36.7 C 37.2 C  SpO2: 99% 97%    Last Pain:  Vitals:   05/26/18 1148  TempSrc: Oral  PainSc:                  Dayten Juba COKER

## 2018-05-26 NOTE — Discharge Instructions (Signed)
° °  [facility] ° °Discharge Instructions ° °AV Fistula or Graft Surgery for Dialysis Access ° °Please refer to the following instructions for your post-procedure care. Your surgeon or physician assistant will discuss any changes with you. ° °Activity ° °You may drive the day following your surgery, if you are comfortable and no longer taking prescription pain medication. Resume full activity as the soreness in your incision resolves. ° °Bathing/Showering ° °You may shower after you go home. Keep your incision dry for 48 hours. Do not soak in a bathtub, hot tub, or swim until the incision heals completely. You may not shower if you have a hemodialysis catheter. ° °Incision Care ° °Clean your incision with mild soap and water after 48 hours. Pat the area dry with a clean towel. You do not need a bandage unless otherwise instructed. Do not apply any ointments or creams to your incision. You may have skin glue on your incision. Do not peel it off. It will come off on its own in about one week. Your arm may swell a bit after surgery. To reduce swelling use pillows to elevate your arm so it is above your heart. Your doctor will tell you if you need to lightly wrap your arm with an ACE bandage. ° °Diet ° °Resume your normal diet. There are not special food restrictions following this procedure. In order to heal from your surgery, it is CRITICAL to get adequate nutrition. Your body requires vitamins, minerals, and protein. Vegetables are the best source of vitamins and minerals. Vegetables also provide the perfect balance of protein. Processed food has little nutritional value, so try to avoid this. ° °Medications ° °Resume taking all of your medications. If your incision is causing pain, you may take over-the counter pain relievers such as acetaminophen (Tylenol). If you were prescribed a stronger pain medication, please be aware these medications can cause nausea and constipation. Prevent  nausea by taking the medication with a snack or meal. Avoid constipation by drinking plenty of fluids and eating foods with high amount of fiber, such as fruits, vegetables, and grains. Do not take Tylenol if you are taking prescription pain medications. ° ° ° ° °Follow up °Your surgeon may want to see you in the office following your access surgery. If so, this will be arranged at the time of your surgery. ° °Please call us immediately for any of the following conditions: ° °Increased pain, redness, drainage (pus) from your incision site °Fever of 101 degrees or higher °Severe or worsening pain at your incision site °Hand pain or numbness. ° °Reduce your risk of vascular disease: ° °Stop smoking. If you would like help, call [facility] at 1-800-QUIT-NOW (1-800-784-8669) or  at 336-586-4000 ° °Manage your cholesterol °Maintain a desired weight °Control your diabetes °Keep your blood pressure down ° °Dialysis ° °It will take several weeks to several months for your new dialysis access to be ready for use. Your surgeon will determine when it is OK to use it. Your nephrologist will continue to direct your dialysis. You can continue to use your Permcath until your new access is ready for use. ° °If you have any questions, please call the office at [phone]. ° °

## 2018-05-26 NOTE — Progress Notes (Signed)
Subjective: Interval History: has no complaint, breathing ok.  Objective: Vital signs in last 24 hours: Temp:  [97.9 F (36.6 C)-99.3 F (37.4 C)] 98.4 F (36.9 C) (01/13 1659) Pulse Rate:  [69-83] 83 (01/13 1659) Resp:  [16-20] 16 (01/13 1659) BP: (140-161)/(80-93) 149/81 (01/13 1659) SpO2:  [96 %-99 %] 96 % (01/13 1659) Weight:  [79.2 kg] 79.2 kg (01/13 0520) Weight change: -1.043 kg  Intake/Output from previous day: 01/12 0701 - 01/13 0700 In: 480 [P.O.:480] Out: 2600 [Urine:2600] Intake/Output this shift: Total I/O In: 1256.6 [P.O.:720; I.V.:436.6; IV Piggyback:100] Out: 670 [Urine:650; Blood:20]  General appearance: alert, cooperative and no distress Resp: clear to auscultation bilaterally Cardio: S1, S2 normal and systolic murmur: holosystolic 2/6, blowing at apex GI: soft, pos bs, liver down 4 cm Extremities: extremities normal, atraumatic, no cyanosis or edema and AVF LFA  Lab Results: Recent Labs    05/25/18 0450 05/26/18 0512  WBC 7.0 7.4  HGB 9.0* 9.9*  HCT 27.8* 30.6*  PLT 285 302   BMET:  Recent Labs    05/25/18 0450 05/26/18 0512  NA 138 136  K 3.7 3.7  CL 106 103  CO2 23 23  GLUCOSE 163* 178*  BUN 45* 48*  CREATININE 4.74* 4.47*  CALCIUM 8.0* 8.1*   No results for input(s): PTH in the last 72 hours. Iron Studies: No results for input(s): IRON, TIBC, TRANSFERRIN, FERRITIN in the last 72 hours.  Studies/Results: Vas [place] Lower Extremity Venous (dvt)  Result Date: 05/25/2018  Lower Venous Study Indications: Edema.  Performing Technologist: Sharion Dove RVS  Examination Guidelines: A complete evaluation includes B-mode imaging, spectral Doppler, color Doppler, and power Doppler as needed of all accessible portions of each vessel. Bilateral testing is considered an integral part of a complete examination. Limited examinations for reoccurring indications may be performed as noted.  Right Venous Findings:  +---------+---------------+---------+-----------+----------+-------+          CompressibilityPhasicitySpontaneityPropertiesSummary +---------+---------------+---------+-----------+----------+-------+ CFV      Full           Yes      Yes                          +---------+---------------+---------+-----------+----------+-------+ SFJ      Full                                                 +---------+---------------+---------+-----------+----------+-------+ FV Prox  Full                                                 +---------+---------------+---------+-----------+----------+-------+ FV Mid   Full                                                 +---------+---------------+---------+-----------+----------+-------+ FV DistalFull                                                 +---------+---------------+---------+-----------+----------+-------+ PFV  Full                                                 +---------+---------------+---------+-----------+----------+-------+ POP      Full           Yes      Yes                          +---------+---------------+---------+-----------+----------+-------+ PTV      Full                                                 +---------+---------------+---------+-----------+----------+-------+ PERO     Full                                                 +---------+---------------+---------+-----------+----------+-------+  Left Venous Findings: +---------+---------------+---------+-----------+----------+-------+          CompressibilityPhasicitySpontaneityPropertiesSummary +---------+---------------+---------+-----------+----------+-------+ CFV      Full           Yes      Yes                          +---------+---------------+---------+-----------+----------+-------+ SFJ      Full                                                  +---------+---------------+---------+-----------+----------+-------+ FV Prox  Full                                                 +---------+---------------+---------+-----------+----------+-------+ FV Mid   Full                                                 +---------+---------------+---------+-----------+----------+-------+ FV DistalFull                                                 +---------+---------------+---------+-----------+----------+-------+ PFV      Full                                                 +---------+---------------+---------+-----------+----------+-------+ POP      Full           Yes      Yes                          +---------+---------------+---------+-----------+----------+-------+ PTV  Full                                                 +---------+---------------+---------+-----------+----------+-------+ PERO     Full                                                 +---------+---------------+---------+-----------+----------+-------+    Summary: Right: There is no evidence of deep vein thrombosis in the lower extremity. Left: There is no evidence of deep vein thrombosis in the lower extremity.  *See table(s) above for measurements and observations. Electronically signed by Monica Martinez MD on 05/25/2018 at 2:43:32 PM.    Final    Vas [place] Upper Ext Vein Mapping (pre-op Avf)  Result Date: 05/25/2018 UPPER EXTREMITY VEIN MAPPING  Indications: Pre-op dialysis access. Comparison Study: None on file Performing Technologist: Sharion Dove RVS  Examination Guidelines: A complete evaluation includes B-mode imaging, spectral Doppler, color Doppler, and power Doppler as needed of all accessible portions of each vessel. Bilateral testing is considered an integral part of a complete examination. Limited examinations for reoccurring indications may be performed as noted. +-----------------+-------------+----------+--------+ Right  Cephalic   Diameter (cm)Depth (cm)Findings +-----------------+-------------+----------+--------+ Prox upper arm       0.24        0.32            +-----------------+-------------+----------+--------+ Mid upper arm        0.32        0.31            +-----------------+-------------+----------+--------+ Dist upper arm       0.32        0.40            +-----------------+-------------+----------+--------+ Antecubital fossa    0.57        0.18            +-----------------+-------------+----------+--------+ Prox forearm         0.45        0.39            +-----------------+-------------+----------+--------+ Mid forearm          0.39        0.29            +-----------------+-------------+----------+--------+ Wrist                0.21        0.21            +-----------------+-------------+----------+--------+ +-----------------+-------------+----------+---------+ Left Cephalic    Diameter (cm)Depth (cm)Findings  +-----------------+-------------+----------+---------+ Prox upper arm       0.38        0.30             +-----------------+-------------+----------+---------+ Mid upper arm        0.36        0.32             +-----------------+-------------+----------+---------+ Dist upper arm       0.43        0.29             +-----------------+-------------+----------+---------+ Antecubital fossa    0.50        0.26             +-----------------+-------------+----------+---------+ Prox forearm  0.40        0.36   branching +-----------------+-------------+----------+---------+ Mid forearm          0.35        0.25             +-----------------+-------------+----------+---------+ Wrist                0.28        0.23             +-----------------+-------------+----------+---------+ *See table(s) above for measurements and observations.  Diagnosing physician:    Preliminary     I have reviewed the patient's current  medications.  Assessment/Plan: 1 CKD 4-5 diuresing,AVF in place . Use po diuretics  2 HTN lower vol. 3 Anemia use ESa, Fe[place] 4 HPTH check 5 DM controlled 6 Sarcoid P po Lasix, esa, ok to d/c     LOS: 4 days   Jeneen Rinks Maleya Leever 05/26/2018,5:34 PM

## 2018-05-26 NOTE — Plan of Care (Signed)
  Problem: Education: Goal: Knowledge of General Education information will improve Description Including pain rating scale, medication(s)/side effects and non-pharmacologic comfort measures Outcome: Progressing   Problem: Health Behavior/Discharge Planning: Goal: Ability to manage health-related needs will improve Outcome: Progressing   Problem: Clinical Measurements: Goal: Ability to maintain clinical measurements within normal limits will improve Outcome: Progressing Goal: Will remain free from infection Outcome: Progressing Goal: Diagnostic test results will improve Outcome: Progressing Goal: Respiratory complications will improve Outcome: Progressing Goal: Cardiovascular complication will be avoided Outcome: Progressing   Problem: Activity: Goal: Risk for activity intolerance will decrease Outcome: Progressing   Problem: Nutrition: Goal: Adequate nutrition will be maintained Outcome: Progressing   Problem: Coping: Goal: Level of anxiety will decrease Outcome: Progressing   Problem: Elimination: Goal: Will not experience complications related to bowel motility Outcome: Progressing Goal: Will not experience complications related to urinary retention Outcome: Progressing   Problem: Pain Managment: Goal: General experience of comfort will improve Outcome: Progressing   Problem: Safety: Goal: Ability to remain free from injury will improve Outcome: Progressing   Problem: Skin Integrity: Goal: Risk for impaired skin integrity will decrease Outcome: Progressing   Problem: Education: Goal: Ability to demonstrate management of disease process will improve Outcome: Progressing Goal: Ability to verbalize understanding of medication therapies will improve Outcome: Progressing Goal: Individualized Educational Video(s) Outcome: Progressing   Problem: Activity: Goal: Capacity to carry out activities will improve Outcome: Progressing   Problem: Cardiac: Goal:  Ability to achieve and maintain adequate cardiopulmonary perfusion will improve Outcome: Completed/Met   Problem: Health Behavior/Discharge Planning: Goal: Ability to manage health-related needs will improve Outcome: Progressing   Problem: Nutritional: Goal: Ability to make healthy dietary choices will improve Outcome: Progressing   Problem: Nutritional: Goal: Ability to make healthy dietary choices will improve Outcome: Progressing   Problem: Clinical Measurements: Goal: Complications related to the disease process, condition or treatment will be avoided or minimized Outcome: Progressing

## 2018-05-26 NOTE — Progress Notes (Signed)
[facility]  Subjective  - No complaints.   Objective (!) 155/93 71 98.9 F (37.2 C) (Oral) 20 97%  Intake/Output Summary (Last 24 hours) at 05/26/2018 0717 Last data filed at 05/26/2018 0600 Gross per 24 hour  Intake 480 ml  Output 2600 ml  Net -2120 ml    Palpable radial and brachial pulses bilateral upper extremities.  Laboratory Lab Results: Recent Labs    05/25/18 0450 05/26/18 0512  WBC 7.0 7.4  HGB 9.0* 9.9*  HCT 27.8* 30.6*  PLT 285 302   BMET Recent Labs    05/25/18 0450 05/26/18 0512  NA 138 136  K 3.7 3.7  CL 106 103  CO2 23 23  GLUCOSE 163* 178*  BUN 45* 48*  CREATININE 4.74* 4.47*  CALCIUM 8.0* 8.1*    COAG Lab Results  Component Value Date   INR 0.97 06/10/2015   No results found for: PTT  Assessment/Planning:  Plan for left upper extremity AVF.  Possible radiocephalic based on vein mapping.  Left arm marked and consent signed.  Marty Heck 05/26/2018 7:17 AM --

## 2018-05-26 NOTE — Progress Notes (Signed)
PROGRESS NOTE  Brandon Carroll  QQV:[id number] DOB: 01/04/1961 DOA: 05/22/2018 PCP: Hoyt Koch, MD   Brief Narrative: Brandon Carroll is a 58 y.o. male with a history of sarcoidosis, stage IV CKD, T2DM, HLD, HTN, and chronic diastolic CHF who presented for increasing shortness of breath, nonproductive cough, found to have a positive d-dimer and leg swelling. He had been treated empirically with prednisone for sarcoid flare, though this has not helped. and was sent to the ED for further evaluation. BNP found to be elevated and V/Q scan recommended for PE evaluation due to acute on chronic kidney failure. He was accepted for admission but waited at [facility] for a bed at [facility], ultimately being admitted to [facility] for nephrology and cardiology consultations for acute on chronic CHF. Echocardiogram demonstrated no severe abnormalities, preserved LV systolic function, so cardiology was not consulted. IV lasix given and his symptoms have improved somewhat. BP medications restarted with improvement in BP.Nephrology has guided diuresis and consulted [facility] who placed left radiocephalic AVF on 3/32/9518. BPs have improved with continued diuresis.  Assessment & Plan: Principal Problem:   Acute renal failure superimposed on stage 4 chronic kidney disease (HCC) Active Problems:   Hyperlipidemia associated with type 2 diabetes mellitus (HCC)   Diabetes mellitus with renal complications (HCC)   Dyspnea   Anemia   CKD (chronic kidney disease) stage 4, GFR 15-29 ml/min (HCC)   Sarcoidosis   Acute on chronic systolic heart failure (HCC)   Positive D dimer   Hypertensive urgency  AKI on stage IV CKD, progression to stage V CKD: Had left RC AVF placed 05/26/2018 by Dr. Carlis Abbott. - -2.6L over past 24 hours with further weight loss, down to 174lbs from 197lbs. This seems to be below recent weights, so unsure of what EDW will settle at. Continuing IV diuresis as creatinine is stable - Monitor UOP, BMP in AM  s/p  placement RC AVF 1/13 by Dr. Carlis Abbott:  - Post op instructions per [facility]VS  Acute on chronic diastolic CHF: Precipitated by worsening renal function most likely. BNP 1449. Clinically improving edema and dyspnea. - Diuresis as above - Monitor daily weights, strict I/O  HTN urgency: ?nonadherence as BP improved with restarting home medications - Restarted home imdur, hydralazine, norvasc, coreg stepwise. Will increase norvasc 5mg  > 10mg  in AM. BPs improved but not at goal.  - Continue diuresis as well  Fever: Isolated 1/10 with actual decrease in WBC 9 > 7.9 > 7. Tmax past 24 hours 99.51F, continues to have no localizing symptoms of infection, or sarcoid flare. No infiltrate on CXR, negative UA. No DVT's noted on U/S UE LE's, low risk V/Q. - Blood cultures NGTD, continue monitoring.   - Incentive spirometry  Anemia of chronic kidney disease: Hgb stable at 9. - Per nephrology  T2DM: HbA1c 6.9%. - Hold/discontinue metformin - SSI  HLD:  - Continue statin  Sarcoidosis: No hx flare/complication to date. No indication for steroids currently.   Positive d-dimer: Low probability V/Q scan, DC'ed heparin.   DVT prophylaxis: Heparin Code Status: Full Family Communication: Fiance at bedside Disposition Plan: Home once cleared by nephrology/euvolemic. Planning AVF vs AVG 1/13.  Consultants:   Nephrology  [facility]VS  Procedures:   None  Antimicrobials:  None   Subjective: Breathing is much better, legs are almost back to baseline, still very modest swelling. Left arm fistula site not significantly painful at this time (shortly after returning to the unit following surgery). Denies chest pain, afebrile.   Objective: Vitals:  05/26/18 0904 05/26/18 0915 05/26/18 0935 05/26/18 1148  BP: (!) 145/87 (!) 152/86 (!) 150/85 (!) 146/83  Pulse: 69 72 70 81  Resp: 18 18 16 17   Temp: 97.9 F (36.6 C) 97.9 F (36.6 C) 98 F (36.7 C) 99 F (37.2 C)  TempSrc:   Oral Oral  SpO2: 98% 99% 99% 97%    Weight:      Height:        Intake/Output Summary (Last 24 hours) at 05/26/2018 1454 Last data filed at 05/26/2018 1149 Gross per 24 hour  Intake 1376.6 ml  Output 2020 ml  Net -643.4 ml   Filed Weights   05/24/18 0033 05/25/18 0418 05/26/18 0520  Weight: 83.2 kg 80.2 kg 79.2 kg   Gen: 58 y.o. male in no distress Pulm: Nonlabored breathing room air CV: Regular rate and rhythm. No JVD, trace (improved) dependent edema. GI: Abdomen soft, non-tender, non-distended, with normoactive bowel sounds.  Ext: Warm, no deformities Skin: Left volar forearm w/incision site dermabonded c/d/i, +thrill proximally. No other rashes, lesions or ulcers on visualized skin. Neuro: Alert and oriented. No focal neurological deficits. Psych: Judgement and insight appear fair. Mood euthymic & affect congruent. Behavior is appropriate.    Data Reviewed: I have personally reviewed following labs and imaging studies  CBC: Recent Labs  Lab 05/20/18 1709 05/22/18 2016 05/24/18 0411 05/25/18 0450 05/26/18 0512  WBC 9.3 9.3 7.9 7.0 7.4  NEUTROABS 6.8 6.8  --   --   --   HGB 9.8* 9.0* 9.2* 9.0* 9.9*  HCT 29.9* 28.6* 27.1* 27.8* 30.6*  MCV 76.1* 76.9* 75.3* 76.0* 75.7*  PLT 276.0 276 293 285 161   Basic Metabolic Panel: Recent Labs  Lab 05/22/18 2016 05/23/18 0753 05/24/18 0411 05/25/18 0450 05/26/18 0512  NA 137 138 139 138 136  K 3.9 3.8 3.5 3.7 3.7  CL 107 108 107 106 103  CO2 22 22 22 23 23   GLUCOSE 90 80 88 163* 178*  BUN 50* 44* 40* 45* 48*  CREATININE 4.73* 4.57* 4.60* 4.74* 4.47*  CALCIUM 8.1* 8.2* 8.3* 8.0* 8.1*  MG  --   --  2.0  --   --    GFR: Estimated Creatinine Clearance: 19.3 mL/min (A) (by C-G formula based on SCr of 4.47 mg/dL (H)). Liver Function Tests: No results for input(s): AST, ALT, ALKPHOS, BILITOT, PROT, ALBUMIN in the last 168 hours. No results for input(s): LIPASE, AMYLASE in the last 168 hours. No results for input(s): AMMONIA in the last 168  hours. Coagulation Profile: No results for input(s): INR, PROTIME in the last 168 hours. Cardiac Enzymes: Recent Labs  Lab 05/22/18 2016 05/23/18 0753  TROPONINI 0.08* 0.07*   BNP (last 3 results) Recent Labs    02/07/18 1601 05/20/18 1709  PROBNP 478.0* 1,460.0*   HbA1C: Recent Labs    05/23/18 2220  HGBA1C 6.9*   CBG: Recent Labs  Lab 05/25/18 1203 05/25/18 1706 05/25/18 2101 05/26/18 0908 05/26/18 1145  GLUCAP 179* 178* 210* 138* 385*   Lipid Profile: No results for input(s): CHOL, HDL, LDLCALC, TRIG, CHOLHDL, LDLDIRECT in the last 72 hours. Thyroid Function Tests: No results for input(s): TSH, T4TOTAL, FREET4, T3FREE, THYROIDAB in the last 72 hours. Anemia Panel: No results for input(s): VITAMINB12, FOLATE, FERRITIN, TIBC, IRON, RETICCTPCT in the last 72 hours. Urine analysis:    Component Value Date/Time   COLORURINE STRAW (A) 05/24/2018 1216   APPEARANCEUR CLEAR 05/24/2018 1216   APPEARANCEUR Clear 03/21/2017 1124   LABSPEC  1.010 05/24/2018 1216   PHURINE 7.0 05/24/2018 1216   GLUCOSEU 50 (A) 05/24/2018 1216   HGBUR SMALL (A) 05/24/2018 1216   BILIRUBINUR NEGATIVE 05/24/2018 1216   BILIRUBINUR Negative 03/21/2017 1124   KETONESUR NEGATIVE 05/24/2018 1216   PROTEINUR 100 (A) 05/24/2018 1216   UROBILINOGEN 0.2 08/20/2011 1801   UROBILINOGEN 1.0 09/11/2008 1120   NITRITE NEGATIVE 05/24/2018 1216   LEUKOCYTESUR NEGATIVE 05/24/2018 1216   LEUKOCYTESUR Negative 03/21/2017 1124   Recent Results (from the past 240 hour(s))  Culture, blood (routine x 2)     Status: None (Preliminary result)   Collection Time: 05/24/18  8:31 AM  Result Value Ref Range Status   Specimen Description BLOOD RIGHT HAND  Final   Special Requests   Final    BOTTLES DRAWN AEROBIC ONLY Blood Culture adequate volume   Culture   Final    NO GROWTH 2 DAYS Performed at [facility], [place] [address] [place], [place] [address]    Report Status PENDING  Incomplete  Culture,  blood (routine x 2)     Status: None (Preliminary result)   Collection Time: 05/24/18  8:37 AM  Result Value Ref Range Status   Specimen Description BLOOD RIGHT HAND  Final   Special Requests   Final    BOTTLES DRAWN AEROBIC ONLY Blood Culture adequate volume   Culture   Final    NO GROWTH 2 DAYS Performed at [facility], [place] [address], [place], [place] [address]    Report Status PENDING  Incomplete  Surgical pcr screen     Status: Abnormal   Collection Time: 05/25/18 10:22 PM  Result Value Ref Range Status   MRSA, PCR NEGATIVE NEGATIVE Final   Staphylococcus aureus POSITIVE (A) NEGATIVE Final    Comment: (NOTE) The Xpert SA Assay (FDA approved for NASAL specimens in patients 62 years of age and older), is one component of a comprehensive surveillance program. It is not intended to diagnose infection nor to guide or monitor treatment. Performed at [facility], [place] [address] [place],  [address]       Radiology Studies: Vas [place] Lower Extremity Venous (dvt)  Result Date: 05/25/2018  Lower Venous Study Indications: Edema.  Performing Technologist: Sharion Dove RVS  Examination Guidelines: A complete evaluation includes B-mode imaging, spectral Doppler, color Doppler, and power Doppler as needed of all accessible portions of each vessel. Bilateral testing is considered an integral part of a complete examination. Limited examinations for reoccurring indications may be performed as noted.  Right Venous Findings: +---------+---------------+---------+-----------+----------+-------+          CompressibilityPhasicitySpontaneityPropertiesSummary +---------+---------------+---------+-----------+----------+-------+ CFV      Full           Yes      Yes                          +---------+---------------+---------+-----------+----------+-------+ SFJ      Full                                                  +---------+---------------+---------+-----------+----------+-------+ FV Prox  Full                                                 +---------+---------------+---------+-----------+----------+-------+  FV Mid   Full                                                 +---------+---------------+---------+-----------+----------+-------+ FV DistalFull                                                 +---------+---------------+---------+-----------+----------+-------+ PFV      Full                                                 +---------+---------------+---------+-----------+----------+-------+ POP      Full           Yes      Yes                          +---------+---------------+---------+-----------+----------+-------+ PTV      Full                                                 +---------+---------------+---------+-----------+----------+-------+ PERO     Full                                                 +---------+---------------+---------+-----------+----------+-------+  Left Venous Findings: +---------+---------------+---------+-----------+----------+-------+          CompressibilityPhasicitySpontaneityPropertiesSummary +---------+---------------+---------+-----------+----------+-------+ CFV      Full           Yes      Yes                          +---------+---------------+---------+-----------+----------+-------+ SFJ      Full                                                 +---------+---------------+---------+-----------+----------+-------+ FV Prox  Full                                                 +---------+---------------+---------+-----------+----------+-------+ FV Mid   Full                                                 +---------+---------------+---------+-----------+----------+-------+ FV DistalFull                                                  +---------+---------------+---------+-----------+----------+-------+  PFV      Full                                                 +---------+---------------+---------+-----------+----------+-------+ POP      Full           Yes      Yes                          +---------+---------------+---------+-----------+----------+-------+ PTV      Full                                                 +---------+---------------+---------+-----------+----------+-------+ PERO     Full                                                 +---------+---------------+---------+-----------+----------+-------+    Summary: Right: There is no evidence of deep vein thrombosis in the lower extremity. Left: There is no evidence of deep vein thrombosis in the lower extremity.  *See table(s) above for measurements and observations. Electronically signed by Monica Martinez MD on 05/25/2018 at 2:43:32 PM.    Final    Vas [place] Upper Ext Vein Mapping (pre-op Avf)  Result Date: 05/25/2018 UPPER EXTREMITY VEIN MAPPING  Indications: Pre-op dialysis access. Comparison Study: None on file Performing Technologist: Sharion Dove RVS  Examination Guidelines: A complete evaluation includes B-mode imaging, spectral Doppler, color Doppler, and power Doppler as needed of all accessible portions of each vessel. Bilateral testing is considered an integral part of a complete examination. Limited examinations for reoccurring indications may be performed as noted. +-----------------+-------------+----------+--------+ Right Cephalic   Diameter (cm)Depth (cm)Findings +-----------------+-------------+----------+--------+ Prox upper arm       0.24        0.32            +-----------------+-------------+----------+--------+ Mid upper arm        0.32        0.31            +-----------------+-------------+----------+--------+ Dist upper arm       0.32        0.40             +-----------------+-------------+----------+--------+ Antecubital fossa    0.57        0.18            +-----------------+-------------+----------+--------+ Prox forearm         0.45        0.39            +-----------------+-------------+----------+--------+ Mid forearm          0.39        0.29            +-----------------+-------------+----------+--------+ Wrist                0.21        0.21            +-----------------+-------------+----------+--------+ +-----------------+-------------+----------+---------+ Left Cephalic    Diameter (cm)Depth (cm)Findings  +-----------------+-------------+----------+---------+ Prox upper arm       0.38  0.30             +-----------------+-------------+----------+---------+ Mid upper arm        0.36        0.32             +-----------------+-------------+----------+---------+ Dist upper arm       0.43        0.29             +-----------------+-------------+----------+---------+ Antecubital fossa    0.50        0.26             +-----------------+-------------+----------+---------+ Prox forearm         0.40        0.36   branching +-----------------+-------------+----------+---------+ Mid forearm          0.35        0.25             +-----------------+-------------+----------+---------+ Wrist                0.28        0.23             +-----------------+-------------+----------+---------+ *See table(s) above for measurements and observations.  Diagnosing physician:    Preliminary     Scheduled Meds: . amLODipine  5 mg Oral Daily  . aspirin EC  81 mg Oral Daily  . carvedilol  6.25 mg Oral BID WC  . Chlorhexidine Gluconate Cloth  6 each Topical Daily  . furosemide  80 mg Intravenous Q12H  . hydrALAZINE  50 mg Oral TID  . insulin aspart  0-9 Units Subcutaneous TID WC  . isosorbide mononitrate  30 mg Oral Daily  . mupirocin ointment  1 application Nasal BID  . rosuvastatin  40 mg Oral q1800  .  sodium chloride flush  3 mL Intravenous Q12H   Continuous Infusions:    LOS: 4 days   Time spent: 25 minutes.  Patrecia Pour, MD [facility] [email or web address] Password [facility] 05/26/2018, 2:54 PM

## 2018-05-26 NOTE — Anesthesia Procedure Notes (Signed)
Procedure Name: MAC Date/Time: 05/26/2018 7:30 AM Performed by: Neldon Newport, CRNA Pre-anesthesia Checklist: Timeout performed, Patient being monitored, Suction available, Emergency Drugs available and Patient identified Patient Re-evaluated:Patient Re-evaluated prior to induction Oxygen Delivery Method: Simple face mask

## 2018-05-26 NOTE — Anesthesia Preprocedure Evaluation (Addendum)
Anesthesia Evaluation  Patient identified by MRN, date of birth, ID band  Reviewed: Allergy & Precautions, H&P , NPO status , Patient's Chart, lab work & pertinent test results  Airway Mallampati: II  TM Distance: >3 FB Neck ROM: full    Dental  (+) Teeth Intact, Dental Advidsory Given   Pulmonary shortness of breath and with exertion,    breath sounds clear to auscultation       Cardiovascular hypertension, +CHF   Rhythm:regular Rate:Normal     Neuro/Psych    GI/Hepatic   Endo/Other  diabetes  Renal/GU ESRFRenal disease     Musculoskeletal   Abdominal   Peds  Hematology  (+) Blood dyscrasia, anemia ,   Anesthesia Other Findings   Reproductive/Obstetrics                            Anesthesia Physical Anesthesia Plan  ASA: III  Anesthesia Plan: MAC   Post-op Pain Management:    Induction: Intravenous  PONV Risk Score and Plan: Ondansetron and Propofol infusion  Airway Management Planned: Natural Airway and Simple Face Mask  Additional Equipment:   Intra-op Plan:   Post-operative Plan:   Informed Consent: I have reviewed the patients History and Physical, chart, labs and discussed the procedure including the risks, benefits and alternatives for the proposed anesthesia with the patient or authorized representative who has indicated his/her understanding and acceptance.   Dental Advisory Given  Plan Discussed with:   Anesthesia Plan Comments:        Anesthesia Quick Evaluation

## 2018-05-27 ENCOUNTER — Encounter: Payer: Managed Care, Other (non HMO) | Admitting: Vascular Surgery

## 2018-05-27 ENCOUNTER — Other Ambulatory Visit (HOSPITAL_COMMUNITY): Payer: Managed Care, Other (non HMO)

## 2018-05-27 ENCOUNTER — Telehealth: Payer: Self-pay | Admitting: *Deleted

## 2018-05-27 ENCOUNTER — Encounter (HOSPITAL_COMMUNITY): Payer: Self-pay | Admitting: Vascular Surgery

## 2018-05-27 ENCOUNTER — Encounter (HOSPITAL_COMMUNITY): Payer: Managed Care, Other (non HMO)

## 2018-05-27 LAB — BLOOD CULTURE ID PANEL (REFLEXED)
Acinetobacter baumannii: NOT DETECTED
Candida albicans: NOT DETECTED
Candida glabrata: NOT DETECTED
Candida krusei: NOT DETECTED
Candida parapsilosis: NOT DETECTED
Candida tropicalis: NOT DETECTED
ENTEROCOCCUS SPECIES: NOT DETECTED
Enterobacter cloacae complex: NOT DETECTED
Enterobacteriaceae species: NOT DETECTED
Escherichia coli: NOT DETECTED
Haemophilus influenzae: NOT DETECTED
Klebsiella oxytoca: NOT DETECTED
Klebsiella pneumoniae: NOT DETECTED
Listeria monocytogenes: NOT DETECTED
Neisseria meningitidis: NOT DETECTED
Proteus species: NOT DETECTED
Pseudomonas aeruginosa: NOT DETECTED
SERRATIA MARCESCENS: NOT DETECTED
Staphylococcus aureus (BCID): NOT DETECTED
Staphylococcus species: NOT DETECTED
Streptococcus agalactiae: NOT DETECTED
Streptococcus pneumoniae: NOT DETECTED
Streptococcus pyogenes: NOT DETECTED
Streptococcus species: NOT DETECTED

## 2018-05-27 LAB — CBC
HCT: 32.2 % — ABNORMAL LOW (ref 39.0–52.0)
Hemoglobin: 10.9 g/dL — ABNORMAL LOW (ref 13.0–17.0)
MCH: 25.7 pg — ABNORMAL LOW (ref 26.0–34.0)
MCHC: 33.9 g/dL (ref 30.0–36.0)
MCV: 75.9 fL — ABNORMAL LOW (ref 80.0–100.0)
NRBC: 0 % (ref 0.0–0.2)
Platelets: 353 10*3/uL (ref 150–400)
RBC: 4.24 MIL/uL (ref 4.22–5.81)
RDW: 15.1 % (ref 11.5–15.5)
WBC: 13.2 10*3/uL — ABNORMAL HIGH (ref 4.0–10.5)

## 2018-05-27 LAB — RENAL FUNCTION PANEL
Albumin: 2.5 g/dL — ABNORMAL LOW (ref 3.5–5.0)
Anion gap: 11 (ref 5–15)
BUN: 57 mg/dL — ABNORMAL HIGH (ref 6–20)
CHLORIDE: 103 mmol/L (ref 98–111)
CO2: 22 mmol/L (ref 22–32)
Calcium: 8.7 mg/dL — ABNORMAL LOW (ref 8.9–10.3)
Creatinine, Ser: 4.96 mg/dL — ABNORMAL HIGH (ref 0.61–1.24)
GFR calc Af Amer: 14 mL/min — ABNORMAL LOW (ref >60)
GFR calc non Af Amer: 12 mL/min — ABNORMAL LOW (ref >60)
Glucose, Bld: 168 mg/dL — ABNORMAL HIGH (ref 70–99)
Phosphorus: 5 mg/dL — ABNORMAL HIGH (ref 2.5–4.6)
Potassium: 4.2 mmol/L (ref 3.5–5.1)
SODIUM: 136 mmol/L (ref 135–145)

## 2018-05-27 LAB — GLUCOSE, CAPILLARY: Glucose-Capillary: 207 mg/dL — ABNORMAL HIGH (ref 70–99)

## 2018-05-27 MED ORDER — HEPARIN SODIUM (PORCINE) 5000 UNIT/ML IJ SOLN
5000.0000 [IU] | Freq: Three times a day (TID) | INTRAMUSCULAR | Status: DC
  Filled 2018-05-27: qty 1

## 2018-05-27 MED ORDER — FUROSEMIDE 80 MG PO TABS: 160.0000 mg | ORAL_TABLET | Freq: Two times a day (BID) | ORAL | 0 refills | Status: DC

## 2018-05-27 NOTE — Progress Notes (Signed)
     Post operative care out of work for 2 weeks.  Surgery at [facility] 05/26/2018 By Dr. Wilmer Floor [facility]    Roxy Horseman PA-C

## 2018-05-27 NOTE — Progress Notes (Signed)
[facility]  Subjective  - No complaints.   Objective (!) 159/91 79 97.8 F (36.6 C) (Oral) 18 100%  Intake/Output Summary (Last 24 hours) at 05/27/2018 0811 Last data filed at 05/27/2018 0503 Gross per 24 hour  Intake 1976.6 ml  Output 1645 ml  Net 331.6 ml    Left radiocephalic AVF with good thrill  Laboratory Lab Results: Recent Labs    05/26/18 0512 05/27/18 0429  WBC 7.4 13.2*  HGB 9.9* 10.9*  HCT 30.6* 32.2*  PLT 302 353   BMET Recent Labs    05/26/18 0512 05/27/18 0429  NA 136 136  K 3.7 4.2  CL 103 103  CO2 23 22  GLUCOSE 178* 168*  BUN 48* 57*  CREATININE 4.47* 4.96*  CALCIUM 8.1* 8.7*    COAG Lab Results  Component Value Date   INR 0.97 06/10/2015   No results found for: PTT  Assessment/Planning: POD #1 s/p left radiocephalic AVF with good thrill.  Pain prescription on chart.  Will get work note as well.  Can be discharged from our standpoint.    Marty Heck 05/27/2018 8:11 AM --

## 2018-05-27 NOTE — Progress Notes (Signed)
Subjective: Interval History: has no complaint, feels better, good appetite.  Objective: Vital signs in last 24 hours: Temp:  [97.8 F (36.6 C)-99 F (37.2 C)] 98.3 F (36.8 C) (01/14 0834) Pulse Rate:  [69-83] 72 (01/14 0834) Resp:  [16-18] 16 (01/14 0834) BP: (139-159)/(69-91) 157/89 (01/14 0834) SpO2:  [96 %-100 %] 100 % (01/14 0834) Weight:  [78.3 kg] 78.3 kg (01/14 0501) Weight change: -0.816 kg  Intake/Output from previous day: 01/13 0701 - 01/14 0700 In: 1976.6 [P.O.:1440; I.V.:436.6; IV Piggyback:100] Out: 7035 [Urine:1625; Blood:20] Intake/Output this shift: Total I/O In: 120 [P.O.:120] Out: 375 [Urine:375]  General appearance: alert, cooperative and no distress Resp: clear to auscultation bilaterally Cardio: S1, S2 normal and systolic murmur: systolic ejection 2/6, crescendo and decrescendo at 2nd left intercostal space GI: soft, non-tender; bowel sounds normal; no masses,  no organomegaly Extremities: AVF LLA  B&T, hand warm.  no edema  Lab Results: Recent Labs    05/26/18 0512 05/27/18 0429  WBC 7.4 13.2*  HGB 9.9* 10.9*  HCT 30.6* 32.2*  PLT 302 353   BMET:  Recent Labs    05/26/18 0512 05/27/18 0429  NA 136 136  K 3.7 4.2  CL 103 103  CO2 23 22  GLUCOSE 178* 168*  BUN 48* 57*  CREATININE 4.47* 4.96*  CALCIUM 8.1* 8.7*   No results for input(s): PTH in the last 72 hours. Iron Studies: No results for input(s): IRON, TIBC, TRANSFERRIN, FERRITIN in the last 72 hours.  Studies/Results: Vas [place] Lower Extremity Venous (dvt)  Result Date: 05/25/2018  Lower Venous Study Indications: Edema.  Performing Technologist: Sharion Dove RVS  Examination Guidelines: A complete evaluation includes B-mode imaging, spectral Doppler, color Doppler, and power Doppler as needed of all accessible portions of each vessel. Bilateral testing is considered an integral part of a complete examination. Limited examinations for reoccurring indications may be performed as  noted.  Right Venous Findings: +---------+---------------+---------+-----------+----------+-------+          CompressibilityPhasicitySpontaneityPropertiesSummary +---------+---------------+---------+-----------+----------+-------+ CFV      Full           Yes      Yes                          +---------+---------------+---------+-----------+----------+-------+ SFJ      Full                                                 +---------+---------------+---------+-----------+----------+-------+ FV Prox  Full                                                 +---------+---------------+---------+-----------+----------+-------+ FV Mid   Full                                                 +---------+---------------+---------+-----------+----------+-------+ FV DistalFull                                                 +---------+---------------+---------+-----------+----------+-------+  PFV      Full                                                 +---------+---------------+---------+-----------+----------+-------+ POP      Full           Yes      Yes                          +---------+---------------+---------+-----------+----------+-------+ PTV      Full                                                 +---------+---------------+---------+-----------+----------+-------+ PERO     Full                                                 +---------+---------------+---------+-----------+----------+-------+  Left Venous Findings: +---------+---------------+---------+-----------+----------+-------+          CompressibilityPhasicitySpontaneityPropertiesSummary +---------+---------------+---------+-----------+----------+-------+ CFV      Full           Yes      Yes                          +---------+---------------+---------+-----------+----------+-------+ SFJ      Full                                                  +---------+---------------+---------+-----------+----------+-------+ FV Prox  Full                                                 +---------+---------------+---------+-----------+----------+-------+ FV Mid   Full                                                 +---------+---------------+---------+-----------+----------+-------+ FV DistalFull                                                 +---------+---------------+---------+-----------+----------+-------+ PFV      Full                                                 +---------+---------------+---------+-----------+----------+-------+ POP      Full           Yes      Yes                          +---------+---------------+---------+-----------+----------+-------+  PTV      Full                                                 +---------+---------------+---------+-----------+----------+-------+ PERO     Full                                                 +---------+---------------+---------+-----------+----------+-------+    Summary: Right: There is no evidence of deep vein thrombosis in the lower extremity. Left: There is no evidence of deep vein thrombosis in the lower extremity.  *See table(s) above for measurements and observations. Electronically signed by Monica Martinez MD on 05/25/2018 at 2:43:32 PM.    Final    Vas [place] Upper Ext Vein Mapping (pre-op Avf)  Result Date: 05/25/2018 UPPER EXTREMITY VEIN MAPPING  Indications: Pre-op dialysis access. Comparison Study: None on file Performing Technologist: Sharion Dove RVS  Examination Guidelines: A complete evaluation includes B-mode imaging, spectral Doppler, color Doppler, and power Doppler as needed of all accessible portions of each vessel. Bilateral testing is considered an integral part of a complete examination. Limited examinations for reoccurring indications may be performed as noted. +-----------------+-------------+----------+--------+ Right  Cephalic   Diameter (cm)Depth (cm)Findings +-----------------+-------------+----------+--------+ Prox upper arm       0.24        0.32            +-----------------+-------------+----------+--------+ Mid upper arm        0.32        0.31            +-----------------+-------------+----------+--------+ Dist upper arm       0.32        0.40            +-----------------+-------------+----------+--------+ Antecubital fossa    0.57        0.18            +-----------------+-------------+----------+--------+ Prox forearm         0.45        0.39            +-----------------+-------------+----------+--------+ Mid forearm          0.39        0.29            +-----------------+-------------+----------+--------+ Wrist                0.21        0.21            +-----------------+-------------+----------+--------+ +-----------------+-------------+----------+---------+ Left Cephalic    Diameter (cm)Depth (cm)Findings  +-----------------+-------------+----------+---------+ Prox upper arm       0.38        0.30             +-----------------+-------------+----------+---------+ Mid upper arm        0.36        0.32             +-----------------+-------------+----------+---------+ Dist upper arm       0.43        0.29             +-----------------+-------------+----------+---------+ Antecubital fossa    0.50        0.26             +-----------------+-------------+----------+---------+  Prox forearm         0.40        0.36   branching +-----------------+-------------+----------+---------+ Mid forearm          0.35        0.25             +-----------------+-------------+----------+---------+ Wrist                0.28        0.23             +-----------------+-------------+----------+---------+ *See table(s) above for measurements and observations.  Diagnosing physician:    Preliminary     I have reviewed the patient's current  medications.  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 Not uremic.  Ok to d/c 2 HTn will come down with lower vol, cont meds 3 Anemia stable. 4 HPTH  5 DM P D/C, f/u within 2 wk    LOS: 5 days   Jeneen Rinks Lee Kuang 05/27/2018,8:55 AM

## 2018-05-27 NOTE — Progress Notes (Signed)
PHARMACY - PHYSICIAN COMMUNICATION CRITICAL VALUE ALERT - BLOOD CULTURE IDENTIFICATION (BCID)  Brandon Carroll is an 58 y.o. male who presented to [facility] on 05/22/2018 with a chief complaint of dyspnea  Assessment:  58 yo M presents with dyspnea. Did spike fever of 101/7 near admit. Blood cx's now positive 1/2 with GPC. BCID is negative.  Name of physician (or Provider) ContactedMathews Robinsons  Current antibiotics: None  Changes to prescribed antibiotics recommended:  No changes needed at this time Continue monitor clinical picture and follow up cx results  Results for orders placed or performed during the hospital encounter of 05/22/18  Blood Culture ID Panel (Reflexed) (Collected: 05/24/2018  8:31 AM)  Result Value Ref Range   Enterococcus species NOT DETECTED NOT DETECTED   Listeria monocytogenes NOT DETECTED NOT DETECTED   Staphylococcus species NOT DETECTED NOT DETECTED   Staphylococcus aureus (BCID) NOT DETECTED NOT DETECTED   Streptococcus species NOT DETECTED NOT DETECTED   Streptococcus agalactiae NOT DETECTED NOT DETECTED   Streptococcus pneumoniae NOT DETECTED NOT DETECTED   Streptococcus pyogenes NOT DETECTED NOT DETECTED   Acinetobacter baumannii NOT DETECTED NOT DETECTED   Enterobacteriaceae species NOT DETECTED NOT DETECTED   Enterobacter cloacae complex NOT DETECTED NOT DETECTED   Escherichia coli NOT DETECTED NOT DETECTED   Klebsiella oxytoca NOT DETECTED NOT DETECTED   Klebsiella pneumoniae NOT DETECTED NOT DETECTED   Proteus species NOT DETECTED NOT DETECTED   Serratia marcescens NOT DETECTED NOT DETECTED   Haemophilus influenzae NOT DETECTED NOT DETECTED   Neisseria meningitidis NOT DETECTED NOT DETECTED   Pseudomonas aeruginosa NOT DETECTED NOT DETECTED   Candida albicans NOT DETECTED NOT DETECTED   Candida glabrata NOT DETECTED NOT DETECTED   Candida krusei NOT DETECTED NOT DETECTED   Candida parapsilosis NOT DETECTED NOT DETECTED   Candida tropicalis  NOT DETECTED NOT DETECTED    Brandon Carroll 05/27/2018  9:49 AM

## 2018-05-27 NOTE — Telephone Encounter (Signed)
Pt was on TCM report. Pt  admitted to the hospital for further evaluation and management of cardiac issues and renal function on 05/23/18. Echocardiogram obtained today showed a normal ejection fraction and therefore cardiology consult was not needed. Appears that most of his volume may be renally mediated.  Dr. Augustin Coupe from nephrology saw pt, and started on treatment. Pt D/C 05/27/18, and will f/u w/PCP and nephrologist 06/03/18. Pt insurance is [employer] does not qualify for TCM hosp [id number]

## 2018-05-27 NOTE — Discharge Summary (Signed)
Physician Discharge Summary  Brandon Carroll OIZ:[id number] DOB: 1960-07-05 DOA: 05/22/2018  PCP: Hoyt Koch, MD  Admit date: 05/22/2018 Discharge date: 05/27/2018  Admitted From: Home Disposition: Home   Recommendations for Outpatient Follow-up:  1. Follow up with PCP and nephrology in 1-2 weeks 2. Please obtain BMP/CBC in one week  Home Health: None Equipment/Devices: None Discharge Condition: Stable CODE STATUS: Full Diet recommendation: Renal, carb-modified  Brief/Interim Summary: Brandon Carroll is a 58 y.o. male with a history of sarcoidosis, stage IV CKD, T2DM, HLD, HTN, and chronic diastolic CHF who presented for increasing shortness of breath, nonproductive cough, found to have a positive d-dimer and leg swelling. He had been treated empirically with prednisone for sarcoid flare, though this has not helped. and was sent to the ED for further evaluation. BNP found to be elevated and V/Q scan recommended for PE evaluation due to acute on chronic kidney failure. He was accepted for admission but waited at [facility] for a bed at [facility], ultimately being admitted to [facility] for nephrology and cardiology consultations for acute on chronic CHF. Echocardiogram demonstrated no severe abnormalities, preserved LV systolic function, so cardiology was not consulted. IV lasix given and his symptoms have improved somewhat. BP medications restarted with improvement in BP.Nephrology has guided diuresis and consulted [facility] who placed left radiocephalic AVF on 3/38/2505. BPs have improved with continued diuresis. Weight has trended down with diuresis, from 197lbs on admission to 172lbs at discharge with stable renal function. Blood pressure control has improved and he is symptomatically back to baseline, stable for discharge from vascular surgery and nephrology's stand points.   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 Carlis Abbott. - Weight down 172lbs from 197lbs. Will continue po diuretic per nephrology - Follow up closely with nephrology/PCP and monitor BMP.   s/p placement RC AVF 1/13 by Dr. Carlis Abbott:  - Post op instructions per VVS  Acute on chronic diastolic CHF: Precipitated by worsening renal function most likely. BNP 1449. Clinically improving edema and dyspnea. - Diuresis as above - Monitor daily weights  HTN urgency: ?nonadherence as BP improved with restarting home medications - Restarted home imdur, hydralazine, norvasc, coreg stepwise, continued clonidine patch.  - Continue higher dose lasix, anticipate improved control, though could consider increasing norvasc or other agents prn.  Fever: Isolated 1/10 with actual decrease in WBC 9 > 7.9 > 7. Tmax past 24 hours 99.53F, continues to have no localizing symptoms of infection, or sarcoid flare. No infiltrate on CXR, negative UA. No DVT's noted on U/S UE LE's, low risk V/Q. - Blood cultures NGTD, continue monitoring.   - Incentive spirometry  Anemia of chronic kidney disease: Hgb stable at 9. - Per nephrology  T2DM: HbA1c 6.9%. - Follow up with PCP  HLD:  - Continue statin  Sarcoidosis: No hx flare/complication to date. No indication for steroids currently.   Positive d-dimer: Low probability V/Q scan, DC'ed heparin.   Discharge Instructions Discharge Instructions    (HEART FAILURE PATIENTS) Call  MD:  Anytime you have any of the following symptoms: 1) 3 pound weight gain in 24 hours or 5 pounds in 1 week 2) shortness of breath, with  or without a dry hacking cough 3) swelling in the hands, feet or stomach 4) if you have to sleep on extra pillows at night in order to breathe.   Complete by:  As directed    Diet - low sodium heart healthy   Complete by:  As directed    Discharge instructions   Complete by:  As directed    - Increase lasix to 160mg  twice daily (new prescription sent) - Continue other medications as listed - Follow up with nephrology, their office should contact you for an appointment. If you don't hear from them, please call by the end of the week - Follow up with your primary doctor to discuss further management of diabetes and high blood pressure. - Follow up with vascular surgery in about 6 weeks. - If your symptoms return, seek medical attention right away. Weight yourself daily.   Increase activity slowly   Complete by:  As directed      Allergies as of 05/27/2018      Reactions   Lipitor [atorvastatin] Hives, Itching      Medication List    TAKE these medications   AEROCHAMBER MV inhaler Use as instructed   Albuterol Sulfate 108 (90 Base) MCG/ACT Aepb Commonly known as:  PROAIR RESPICLICK Inhale 2 puffs into the lungs every 6 (six) hours as needed.   amLODipine 5 MG tablet Commonly known as:  NORVASC Take 1 tablet (5 mg total) by mouth daily.   aspirin EC 81 MG tablet Take 81 mg by mouth daily.   BD PEN NEEDLE NANO U/F 32G X 4 MM Misc Generic drug:  Insulin Pen Needle USE ONCE DAILY AS DIRECTED   carvedilol 6.25 MG tablet Commonly known as:  COREG Take 1 tablet (6.25 mg total) by mouth 2 (two) times daily. What changed:  when to take this   cloNIDine 0.3 mg/24hr patch Commonly known as:  CATAPRES - Dosed in mg/24 hr Place 1 patch (0.3 mg total) onto the skin once a week.   fluticasone furoate-vilanterol 100-25 MCG/INH Aepb Commonly known as:  BREO ELLIPTA Inhale 1 puff into the lungs daily.   FREESTYLE LIBRE 14 DAY READER Devi USE AS DIRECTED   furosemide 80 MG  tablet Commonly known as:  LASIX Take 2 tablets (160 mg total) by mouth 2 (two) times daily. What changed:    medication strength  how much to take   glucose blood test strip Commonly known as:  ONETOUCH VERIO 1 each by Other route 2 (two) times daily as needed for other. And lancets 2/day   hydrALAZINE 50 MG tablet Commonly known as:  APRESOLINE Take 50 mg by mouth 3 (three) times daily. What changed:  Another medication with the same name was removed. Continue taking this medication, and follow the directions you see here.   HYDROcodone-acetaminophen 5-325 MG tablet Commonly known as:  NORCO Take 1 tablet by mouth every 6 (six) hours as needed for moderate pain.   Insulin Glargine 100 UNIT/ML Solostar Pen Commonly known as:  LANTUS SOLOSTAR Inject 60 Units into the skin every morning. And pen needles 1/day What changed:  when to take this   isosorbide mononitrate 30 MG 24 hr tablet Commonly known as:  IMDUR Take 1 tablet (30 mg total) by mouth daily.   rosuvastatin 20 MG tablet Commonly  known as:  CRESTOR Take 2 tablets (40 mg total) by mouth daily. What changed:  how much to take      Follow-up Information    Marty Heck, MD In 6 weeks.   Specialty:  Vascular Surgery Why:  Office will call you to arrange your appt (sent) Contact information: [place] [address] [phone]        Madelon Lips, MD Follow up.   Specialty:  Nephrology Contact information: [place] [address] [phone]        Hoyt Koch, MD .   Specialty:  Internal Medicine Contact information: [place] [address] [phone]          Allergies  Allergen Reactions  . Lipitor [Atorvastatin] Hives and Itching    Consultations:  Nephrology  Vascular surgery, Dr. Carlis Abbott  Procedures/Studies: X-ray Chest Pa And Lateral  Result Date: 05/24/2018 CLINICAL DATA:  58 year old male with CHF, shortness of  breath for the past 2 weeks EXAM: CHEST - 2 VIEW COMPARISON:  Prior chest x-ray 05/22/2018 FINDINGS: Stable cardiomegaly. Atherosclerotic calcifications again noted in the transverse aorta. Similar degree of pulmonary vascular congestion without overt pulmonary edema. There are small bilateral pleural effusions as well as fluid tracking along the major and minor fissures. Mild dependent atelectasis. No evidence of pneumothorax or focal airspace consolidation. No acute osseous abnormality. IMPRESSION: 1. Cardiomegaly and pulmonary vascular congestion without overt edema. 2. Small bilateral pleural effusions and bibasilar atelectasis. 3.  Aortic Atherosclerosis (ICD10-170.0) Electronically Signed   By: Jacqulynn Cadet M.D.   On: 05/24/2018 09:22   Dg Chest 2 View  Result Date: 05/22/2018 CLINICAL DATA:  Shortness of breath for 2 weeks EXAM: CHEST - 2 VIEW COMPARISON:  05/08/2018 FINDINGS: Small bilateral pleural effusions. Borderline cardiomegaly with central vascular congestion and patchy perihilar opacities suspect for mild edema. No pneumothorax. Degenerative changes of the spine. IMPRESSION: Small bilateral pleural effusions. Borderline cardiomegaly with vascular congestion and mild perihilar edema. Electronically Signed   By: Donavan Foil M.D.   On: 05/22/2018 20:53   Dg Chest 2 View  Result Date: 05/08/2018 CLINICAL DATA:  Cough, wheezing, and dyspnea for 2 days. Sarcoidosis. EXAM: CHEST - 2 VIEW COMPARISON:  02/07/2018 FINDINGS: The heart size and mediastinal contours are within normal limits. Aortic atherosclerosis. Both lungs are clear. The visualized skeletal structures are unremarkable. IMPRESSION: No active cardiopulmonary disease. Electronically Signed   By: Earle Gell M.D.   On: 05/08/2018 11:28   Nm Pulmonary Perf And Vent  Result Date: 05/23/2018 CLINICAL DATA:  shortness of breath. Has had going for the last couple weeks. Has had shortness of breath. History of sarcoidosis. Cough no  real sputum production. Does have history of CHF. Saw PCP and had positive d-dimer. Has had some swelling in his legs to. Is diabetic. BNP elevated on 1/8 also. No chest pain. Has had a cough that is been mostly dry.Pt historyHTN, HLD, DM, CHF, acute on chronic kidney injury, cardiac cath 2017 EXAM: NUCLEAR MEDICINE VENTILATION - PERFUSION LUNG SCAN TECHNIQUE: Ventilation images were obtained in multiple projections using inhaled aerosol Tc-16m DTPA. Perfusion images were obtained in multiple projections after intravenous injection of Tc-71m MAA. RADIOPHARMACEUTICALS:  30.6 mCi of Tc-35m DTPA aerosol inhalation and 4.23 mCi Tc66m MAA IV COMPARISON:  Companion chest radiograph from the previous day demonstrating small pleural effusions and borderline cardiomegaly FINDINGS: Ventilation: Somewhat heterogenous distribution with central deposition. No focal ventilation defect. Perfusion: No wedge shaped peripheral perfusion  defects to suggest acute pulmonary embolism. IMPRESSION: Low likelihood ratio for pulmonary embolism. Electronically Signed   By: Lucrezia Europe M.D.   On: 05/23/2018 17:42   Vas [place] Lower Extremity Venous (dvt)  Result Date: 05/25/2018  Lower Venous Study Indications: Edema.  Performing Technologist: Sharion Dove RVS  Examination Guidelines: A complete evaluation includes B-mode imaging, spectral Doppler, color Doppler, and power Doppler as needed of all accessible portions of each vessel. Bilateral testing is considered an integral part of a complete examination. Limited examinations for reoccurring indications may be performed as noted.  Right Venous Findings: +---------+---------------+---------+-----------+----------+-------+          CompressibilityPhasicitySpontaneityPropertiesSummary +---------+---------------+---------+-----------+----------+-------+ CFV      Full           Yes      Yes                           +---------+---------------+---------+-----------+----------+-------+ SFJ      Full                                                 +---------+---------------+---------+-----------+----------+-------+ FV Prox  Full                                                 +---------+---------------+---------+-----------+----------+-------+ FV Mid   Full                                                 +---------+---------------+---------+-----------+----------+-------+ FV DistalFull                                                 +---------+---------------+---------+-----------+----------+-------+ PFV      Full                                                 +---------+---------------+---------+-----------+----------+-------+ POP      Full           Yes      Yes                          +---------+---------------+---------+-----------+----------+-------+ PTV      Full                                                 +---------+---------------+---------+-----------+----------+-------+ PERO     Full                                                 +---------+---------------+---------+-----------+----------+-------+  Left Venous Findings: +---------+---------------+---------+-----------+----------+-------+  CompressibilityPhasicitySpontaneityPropertiesSummary +---------+---------------+---------+-----------+----------+-------+ CFV      Full           Yes      Yes                          +---------+---------------+---------+-----------+----------+-------+ SFJ      Full                                                 +---------+---------------+---------+-----------+----------+-------+ FV Prox  Full                                                 +---------+---------------+---------+-----------+----------+-------+ FV Mid   Full                                                  +---------+---------------+---------+-----------+----------+-------+ FV DistalFull                                                 +---------+---------------+---------+-----------+----------+-------+ PFV      Full                                                 +---------+---------------+---------+-----------+----------+-------+ POP      Full           Yes      Yes                          +---------+---------------+---------+-----------+----------+-------+ PTV      Full                                                 +---------+---------------+---------+-----------+----------+-------+ PERO     Full                                                 +---------+---------------+---------+-----------+----------+-------+    Summary: Right: There is no evidence of deep vein thrombosis in the lower extremity. Left: There is no evidence of deep vein thrombosis in the lower extremity.  *See table(s) above for measurements and observations. Electronically signed by Monica Martinez MD on 05/25/2018 at 2:43:32 PM.    Final    Vas [place] Upper Ext Vein Mapping (pre-op Avf)  Result Date: 05/25/2018 UPPER EXTREMITY VEIN MAPPING  Indications: Pre-op dialysis access. Comparison Study: None on file Performing Technologist: Sharion Dove RVS  Examination Guidelines: A complete evaluation includes B-mode imaging, spectral Doppler, color Doppler, and power Doppler as needed of all accessible portions of each vessel. Bilateral testing is considered  an integral part of a complete examination. Limited examinations for reoccurring indications may be performed as noted. +-----------------+-------------+----------+--------+ Right Cephalic   Diameter (cm)Depth (cm)Findings +-----------------+-------------+----------+--------+ Prox upper arm       0.24        0.32            +-----------------+-------------+----------+--------+ Mid upper arm        0.32        0.31             +-----------------+-------------+----------+--------+ Dist upper arm       0.32        0.40            +-----------------+-------------+----------+--------+ Antecubital fossa    0.57        0.18            +-----------------+-------------+----------+--------+ Prox forearm         0.45        0.39            +-----------------+-------------+----------+--------+ Mid forearm          0.39        0.29            +-----------------+-------------+----------+--------+ Wrist                0.21        0.21            +-----------------+-------------+----------+--------+ +-----------------+-------------+----------+---------+ Left Cephalic    Diameter (cm)Depth (cm)Findings  +-----------------+-------------+----------+---------+ Prox upper arm       0.38        0.30             +-----------------+-------------+----------+---------+ Mid upper arm        0.36        0.32             +-----------------+-------------+----------+---------+ Dist upper arm       0.43        0.29             +-----------------+-------------+----------+---------+ Antecubital fossa    0.50        0.26             +-----------------+-------------+----------+---------+ Prox forearm         0.40        0.36   branching +-----------------+-------------+----------+---------+ Mid forearm          0.35        0.25             +-----------------+-------------+----------+---------+ Wrist                0.28        0.23             +-----------------+-------------+----------+---------+ *See table(s) above for measurements and observations.  Diagnosing physician:    Preliminary      Left radiocephaic arteriovenous fistula placement by Dr. Carlis Abbott 05/26/2018  Subjective: Feels well, breathing better than he has in a long time. No swelling on legs. EAger to go home.  Discharge Exam: Vitals:   05/27/18 0501 05/27/18 0834  BP: (!) 159/91 (!) 157/89  Pulse: 79 72  Resp: 18 16  Temp: 97.8 F  (36.6 C) 98.3 F (36.8 C)  SpO2: 100% 100%   General: Pt is alert, awake, not in acute distress Cardiovascular: RRR, S1/S2 +, no rubs, no gallops Respiratory: CTA bilaterally, no wheezing, no rhonchi Abdominal: Soft, NT, ND, bowel sounds + Extremities: No edema, no cyanosis. Left volar forearm with +  thrill, dermabond on AVF site without erythema.  Labs: BNP (last 3 results) Recent Labs    05/23/18 0753 05/24/18 0411  BNP 1,320.5* 6,606.3*   Basic Metabolic Panel: Recent Labs  Lab 05/23/18 0753 05/24/18 0411 05/25/18 0450 05/26/18 0512 05/27/18 0429  NA 138 139 138 136 136  K 3.8 3.5 3.7 3.7 4.2  CL 108 107 106 103 103  CO2 22 22 23 23 22   GLUCOSE 80 88 163* 178* 168*  BUN 44* 40* 45* 48* 57*  CREATININE 4.57* 4.60* 4.74* 4.47* 4.96*  CALCIUM 8.2* 8.3* 8.0* 8.1* 8.7*  MG  --  2.0  --   --   --   PHOS  --   --   --   --  5.0*   Liver Function Tests: Recent Labs  Lab 05/27/18 0429  ALBUMIN 2.5*   No results for input(s): LIPASE, AMYLASE in the last 168 hours. No results for input(s): AMMONIA in the last 168 hours. CBC: Recent Labs  Lab 05/20/18 1709 05/22/18 2016 05/24/18 0411 05/25/18 0450 05/26/18 0512 05/27/18 0429  WBC 9.3 9.3 7.9 7.0 7.4 13.2*  NEUTROABS 6.8 6.8  --   --   --   --   HGB 9.8* 9.0* 9.2* 9.0* 9.9* 10.9*  HCT 29.9* 28.6* 27.1* 27.8* 30.6* 32.2*  MCV 76.1* 76.9* 75.3* 76.0* 75.7* 75.9*  PLT 276.0 276 293 285 302 353   Cardiac Enzymes: Recent Labs  Lab 05/22/18 2016 05/23/18 0753  TROPONINI 0.08* 0.07*   BNP: Invalid input(s): POCBNP CBG: Recent Labs  Lab 05/26/18 0908 05/26/18 1145 05/26/18 1705 05/26/18 2114 05/27/18 0817  GLUCAP 138* 385* 307* 346* 207*   D-Dimer No results for input(s): DDIMER in the last 72 hours. Hgb A1c No results for input(s): HGBA1C in the last 72 hours. Lipid Profile No results for input(s): CHOL, HDL, LDLCALC, TRIG, CHOLHDL, LDLDIRECT in the last 72 hours. Thyroid function studies No  results for input(s): TSH, T4TOTAL, T3FREE, THYROIDAB in the last 72 hours.  Invalid input(s): FREET3 Anemia work up No results for input(s): VITAMINB12, FOLATE, FERRITIN, TIBC, IRON, RETICCTPCT in the last 72 hours. Urinalysis    Component Value Date/Time   COLORURINE STRAW (A) 05/24/2018 1216   APPEARANCEUR CLEAR 05/24/2018 1216   APPEARANCEUR Clear 03/21/2017 1124   LABSPEC 1.010 05/24/2018 1216   PHURINE 7.0 05/24/2018 1216   GLUCOSEU 50 (A) 05/24/2018 1216   HGBUR SMALL (A) 05/24/2018 1216   BILIRUBINUR NEGATIVE 05/24/2018 1216   BILIRUBINUR Negative 03/21/2017 1124   KETONESUR NEGATIVE 05/24/2018 1216   PROTEINUR 100 (A) 05/24/2018 1216   UROBILINOGEN 0.2 08/20/2011 1801   UROBILINOGEN 1.0 09/11/2008 1120   NITRITE NEGATIVE 05/24/2018 1216   LEUKOCYTESUR NEGATIVE 05/24/2018 1216   LEUKOCYTESUR Negative 03/21/2017 1124    Microbiology Recent Results (from the past 240 hour(s))  Culture, blood (routine x 2)     Status: None (Preliminary result)   Collection Time: 05/24/18  8:31 AM  Result Value Ref Range Status   Specimen Description BLOOD RIGHT HAND  Final   Special Requests   Final    BOTTLES DRAWN AEROBIC ONLY Blood Culture adequate volume   Culture  Setup Time   Final    GRAM POSITIVE COCCI AEROBIC BOTTLE ONLY Organism ID to follow Performed at [facility], [place] [address], [place], [place] [address]    Culture PENDING  Incomplete   Report Status PENDING  Incomplete  Culture, blood (routine x 2)     Status: None (  Preliminary result)   Collection Time: 05/24/18  8:37 AM  Result Value Ref Range Status   Specimen Description BLOOD RIGHT HAND  Final   Special Requests   Final    BOTTLES DRAWN AEROBIC ONLY Blood Culture adequate volume   Culture   Final    NO GROWTH 2 DAYS Performed at [facility], [address] [place], [place] [address]    Report Status PENDING  Incomplete  Surgical pcr screen     Status: Abnormal   Collection Time: 05/25/18  10:22 PM  Result Value Ref Range Status   MRSA, PCR NEGATIVE NEGATIVE Final   Staphylococcus aureus POSITIVE (A) NEGATIVE Final    Comment: (NOTE) The Xpert SA Assay (FDA approved for NASAL specimens in patients 110 years of age and older), is one component of a comprehensive surveillance program. It is not intended to diagnose infection nor to guide or monitor treatment. Performed at [facility], [place] [address], [place], [place] [address]     Time coordinating discharge: Approximately 40 minutes  Patrecia Pour, MD  [facility] 05/27/2018, 9:00 AM Pager [phone]

## 2018-05-28 LAB — PARATHYROID HORMONE, INTACT (NO CA): PTH: 136 pg/mL — ABNORMAL HIGH (ref 15–65)

## 2018-05-29 ENCOUNTER — Encounter: Payer: Self-pay | Admitting: Family Medicine

## 2018-05-29 ENCOUNTER — Ambulatory Visit (INDEPENDENT_AMBULATORY_CARE_PROVIDER_SITE_OTHER): Payer: Managed Care, Other (non HMO) | Admitting: Family Medicine

## 2018-05-29 ENCOUNTER — Other Ambulatory Visit: Payer: Managed Care, Other (non HMO)

## 2018-05-29 VITALS — BP 138/76 | HR 74 | Temp 98.8°F | Ht 70.0 in | Wt 178.0 lb

## 2018-05-29 DIAGNOSIS — R7881 Bacteremia: Secondary | ICD-10-CM

## 2018-05-29 DIAGNOSIS — N184 Chronic kidney disease, stage 4 (severe): Secondary | ICD-10-CM

## 2018-05-29 DIAGNOSIS — I10 Essential (primary) hypertension: Secondary | ICD-10-CM | POA: Diagnosis not present

## 2018-05-29 LAB — CULTURE, BLOOD (ROUTINE X 2)
Culture: NO GROWTH
Special Requests: ADEQUATE
Special Requests: ADEQUATE

## 2018-05-29 NOTE — Progress Notes (Signed)
Subjective:    Patient ID: Brandon Carroll, male    DOB: 22-Feb-1961, 58 y.o.   MRN: [id number]  HPI  Brandon Carroll is a 58 year old male who presents today for follow up of blood culture results during recent hospitalization. I am seeing him in place of his PCP who is unavailable at this time. He was hospitalized from 05/22/2018 to 05/27/2018 for SOB, nonproductive cough and a + d-dimer with leg swelling. Pertinent history includes sarcoidosis, CKD Stage IV, T2DM, HLD, HTN, and chronic diastolic CHF. During hospitalization, a V/Q scan was recommended for PE evaluation. Results of scan indicated low likelihood for PE. ECHO was completed in hospital which did not find severe abnormalities, preserved LV systolic function so cardiology not consulted.  He received nephrology guided diuresis and VVS placed Left radiocephalic AVF on 0/25/4270. Blood pressure improved with diuresis, weight decreased, and he was discharged with close follow with nephrology and PCP.  He is scheduled for a hospital follow up with PCP next week.  During hospitalization he had one isolated fever on 05/23/18 with Tmax documented of 99.3 F. He also had a decrease in WBC but no symptoms of infection. No infiltrate on CXR that was completed and UA was negative for Leukocytes, nitrites.  One blood culture obtained which noted staphylococcus species (coagulase negative), after 4 days there was NGTD. He was monitored off of antibiotics without source known and was hemodynamically stable. At discharge there was NGTD of blood culture.  Today, he reports feeling much better and denies SOB, cough, fever, chills, sweats, leg swelling, chest pain, palpitations,  He is monitoring his fluid intake and following restrictions.  He is calling today to schedule nephrology and endocrinology follow up appointments.   Review of Systems  Constitutional: Negative for chills, fatigue and fever.  Respiratory: Negative for cough and shortness of  breath.   Cardiovascular: Negative for chest pain, palpitations and leg swelling.  Gastrointestinal: Negative for abdominal pain, diarrhea, nausea and vomiting.  Genitourinary: Negative for dysuria, frequency and hematuria.  Musculoskeletal: Negative for myalgias.  Skin: Negative for rash.  Neurological: Negative for dizziness, weakness, light-headedness and headaches.   Past Medical History:  Diagnosis Date  . Acute systolic CHF (congestive heart failure), NYHA class 2 ([place]) 05/04/2015  . Acute-on-chronic kidney injury ([place]) 05/04/2015  . Diabetes mellitus   . Erectile dysfunction   . Hyperlipidemia   . Hypertension      Social History   Socioeconomic History  . Marital status: Divorced    Spouse name: n/a  . Number of children: 1  . Years of education: 12+  . Highest education level: Not on file  Occupational History  . Occupation: Building services engineer: [employer]  Social Needs  . Financial resource strain: Not on file  . Food insecurity:    Worry: Not on file    Inability: Not on file  . Transportation needs:    Medical: Not on file    Non-medical: Not on file  Tobacco Use  . Smoking status: Never Smoker  . Smokeless tobacco: Never Used  Substance and Sexual Activity  . Alcohol use: No    Alcohol/week: 0.0 standard drinks  . Drug use: No  . Sexual activity: Not Currently    Comment: Erectile dysfunciotn  Lifestyle  . Physical activity:    Days per week: Not on file    Minutes per session: Not on file  . Stress: Not on file  Relationships  . Social connections:  Talks on phone: Not on file    Gets together: Not on file    Attends religious service: Not on file    Active member of club or organization: Not on file    Attends meetings of clubs or organizations: Not on file    Relationship status: Not on file  . Intimate partner violence:    Fear of current or ex partner: Not on file    Emotionally abused: Not on file    Physically abused: Not on  file    Forced sexual activity: Not on file  Other Topics Concern  . Not on file  Social History Narrative   Divorced. Education: [employer].    Unable to exercise due to significant SOB and DOE.    Lives alone.   Son lives in [place].    Past Surgical History:  Procedure Laterality Date  . AV FISTULA PLACEMENT Left 05/26/2018   Procedure: ARTERIOVENOUS (AV) FISTULA CREATION LEFT ARM;  Surgeon: Marty Heck, MD;  Location: [facility];  Service: Vascular;  Laterality: Left;  . CARDIAC CATHETERIZATION N/A 06/15/2015   Procedure: Left Heart Cath and Coronary Angiography;  Surgeon: Peter M Martinique, MD;  Location: [facility];  Service: Cardiovascular;  Laterality: N/A;  . CHOLECYSTECTOMY N/A 06/28/2017   Procedure: LAPAROSCOPIC CHOLECYSTECTOMY;  Surgeon: Ralene Ok, MD;  Location[facility];  Service: General;  Laterality: N/A;  . HERNIA REPAIR  7th grade   umbilical    Family History  Problem Relation Age of Onset  . Arthritis Mother   . COPD Mother   . Diabetes Mother        was thin, took insulin  . Hypertension Mother   . Hypertension Father   . Arthritis Sister        rheumatoid  . COPD Sister   . Diabetes Brother   . Colon cancer Neg Hx     Allergies  Allergen Reactions  . Lipitor [Atorvastatin] Hives and Itching    Current Outpatient Medications on File Prior to Visit  Medication Sig Dispense Refill  . Albuterol Sulfate (PROAIR RESPICLICK) 628 (90 Base) MCG/ACT AEPB Inhale 2 puffs into the lungs every 6 (six) hours as needed. (Patient not taking: Reported on 05/23/2018) 1 each 1  . amLODipine (NORVASC) 5 MG tablet Take 1 tablet (5 mg total) by mouth daily. 90 tablet 1  . aspirin EC 81 MG tablet Take 81 mg by mouth daily.    . BD PEN NEEDLE NANO U/F 32G X 4 MM MISC USE ONCE DAILY AS DIRECTED 100 each PRN  . carvedilol (COREG) 6.25 MG tablet Take 1 tablet (6.25 mg total) by mouth 2 (two) times daily. (Patient taking differently: Take 6.25 mg by mouth 2 (two)  times daily with a meal. ) 180 tablet 1  . cloNIDine (CATAPRES - DOSED IN MG/24 HR) 0.3 mg/24hr patch Place 1 patch (0.3 mg total) onto the skin once a week. (Patient not taking: Reported on 05/23/2018) 12 patch 3  . Continuous Blood Gluc Receiver (FREESTYLE LIBRE 14 DAY READER) DEVI USE AS DIRECTED 6 Device 3  . fluticasone furoate-vilanterol (BREO ELLIPTA) 100-25 MCG/INH AEPB Inhale 1 puff into the lungs daily. (Patient not taking: Reported on 05/23/2018) 1 each 1  . furosemide (LASIX) 80 MG tablet Take 2 tablets (160 mg total) by mouth 2 (two) times daily. 120 tablet 0  . glucose blood (ONETOUCH VERIO) test strip 1 each by Other route 2 (two) times daily as needed for other. And lancets  2/day 100 each 12  . hydrALAZINE (APRESOLINE) 50 MG tablet Take 50 mg by mouth 3 (three) times daily.    Marland Kitchen HYDROcodone-acetaminophen (NORCO) 5-325 MG tablet Take 1 tablet by mouth every 6 (six) hours as needed for moderate pain. 10 tablet 0  . Insulin Glargine (LANTUS SOLOSTAR) 100 UNIT/ML Solostar Pen Inject 60 Units into the skin every morning. And pen needles 1/day (Patient taking differently: Inject 60 Units into the skin daily at 3 pm. And pen needles 1/day) 10 pen PRN  . isosorbide mononitrate (IMDUR) 30 MG 24 hr tablet Take 1 tablet (30 mg total) by mouth daily. 90 tablet 1  . rosuvastatin (CRESTOR) 20 MG tablet Take 2 tablets (40 mg total) by mouth daily. (Patient taking differently: Take 20 mg by mouth daily. ) 90 tablet 3  . Spacer/Aero-Holding Chambers (AEROCHAMBER MV) inhaler Use as instructed 1 each 0   No current facility-administered medications on file prior to visit.     BP 138/76 (BP Location: Right Arm, Patient Position: Sitting, Cuff Size: Normal)   Pulse 74   Temp 98.8 F (37.1 C) (Oral)   Ht 5\' 10"  (1.778 m)   Wt 178 lb (80.7 kg)   SpO2 97%   BMI 25.54 kg/m       Objective:   Physical Exam Constitutional:      Appearance: Normal appearance.  HENT:     Nose: Nose normal.      Mouth/Throat:     Mouth: Mucous membranes are moist.     Pharynx: Oropharynx is clear.  Eyes:     General: No scleral icterus.    Pupils: Pupils are equal, round, and reactive to light.  Cardiovascular:     Rate and Rhythm: Normal rate and regular rhythm.     Pulses: Normal pulses.     Heart sounds: Normal heart sounds.  Pulmonary:     Effort: Pulmonary effort is normal.     Breath sounds: Normal breath sounds.  Abdominal:     General: Bowel sounds are normal. There is no distension.     Palpations: Abdomen is soft.  Musculoskeletal:        General: No swelling.  Skin:    General: Skin is warm and dry.     Comments: Left radiocephalic AVF healing well without erythema, edema, or drainage present  Neurological:     Mental Status: He is alert.  Psychiatric:        Mood and Affect: Mood normal.        Behavior: Behavior normal.        Thought Content: Thought content normal.        Judgment: Judgment normal.        Assessment & Plan:  1. Positive blood culture Prior positive blood culture in hospital with follow up indicating NGTD at discharge. Low suspicion for infection today. No symptoms of infection and VSS stable. Patient is feeling much better. We discussed options and he would like to proceed with checking blood culture again as he wants to have reassurance that he is not experiencing an infection with chronic conditions present and recent hospitalization. Will obtain blood culture today and advised close follow up if he develops symptoms of infection such as fever, chills, sweats, and SOB. He voiced understanding and agreed with plan.  - Blood culture (routine single); Future - Blood culture (routine single); Future  2. CKD (chronic kidney disease) stage 4, GFR 15-29 ml/min (HCC) Following fluid restriction as directed by nephrology. No urinary  symptoms today. Will schedule follow up appointment today.   3. Essential hypertension Controlled; advised continued regimen  of BP medications as prescribed by PCP.  He will follow up on 06/03/2018 for a hospital follow up with PCP.  Delano Metz, FNP-C

## 2018-05-29 NOTE — Patient Instructions (Signed)
Please go to lab before you leave today.  Follow up with Dr. [name] next week as scheduled.  Please contact your kidney and diabetes provider to schedule an appointment.  If you develop symptoms of fever, chills, sweats, or any new symptoms please seek medical attention.

## 2018-06-03 ENCOUNTER — Ambulatory Visit (INDEPENDENT_AMBULATORY_CARE_PROVIDER_SITE_OTHER): Payer: Managed Care, Other (non HMO) | Admitting: Internal Medicine

## 2018-06-03 ENCOUNTER — Inpatient Hospital Stay: Payer: Managed Care, Other (non HMO) | Admitting: Internal Medicine

## 2018-06-03 ENCOUNTER — Encounter: Payer: Self-pay | Admitting: Internal Medicine

## 2018-06-03 ENCOUNTER — Other Ambulatory Visit (INDEPENDENT_AMBULATORY_CARE_PROVIDER_SITE_OTHER): Payer: Managed Care, Other (non HMO)

## 2018-06-03 VITALS — BP 130/80 | HR 74 | Temp 98.4°F | Ht 70.0 in | Wt 178.0 lb

## 2018-06-03 DIAGNOSIS — I10 Essential (primary) hypertension: Secondary | ICD-10-CM

## 2018-06-03 DIAGNOSIS — N185 Chronic kidney disease, stage 5: Secondary | ICD-10-CM

## 2018-06-03 DIAGNOSIS — N184 Chronic kidney disease, stage 4 (severe): Secondary | ICD-10-CM

## 2018-06-03 DIAGNOSIS — Z794 Long term (current) use of insulin: Secondary | ICD-10-CM

## 2018-06-03 DIAGNOSIS — N179 Acute kidney failure, unspecified: Secondary | ICD-10-CM | POA: Diagnosis not present

## 2018-06-03 DIAGNOSIS — I429 Cardiomyopathy, unspecified: Secondary | ICD-10-CM

## 2018-06-03 DIAGNOSIS — E0822 Diabetes mellitus due to underlying condition with diabetic chronic kidney disease: Secondary | ICD-10-CM | POA: Diagnosis not present

## 2018-06-03 LAB — RENAL FUNCTION PANEL
Albumin: 3.2 g/dL — ABNORMAL LOW (ref 3.5–5.2)
BUN: 66 mg/dL — ABNORMAL HIGH (ref 6–23)
CALCIUM: 8.9 mg/dL (ref 8.4–10.5)
CO2: 24 mEq/L (ref 19–32)
Chloride: 104 mEq/L (ref 96–112)
Creatinine, Ser: 4.89 mg/dL (ref 0.40–1.50)
GFR: 15.03 mL/min — AB (ref >60.00)
GLUCOSE: 191 mg/dL — AB (ref 70–99)
Phosphorus: 5.8 mg/dL — ABNORMAL HIGH (ref 2.3–4.6)
Potassium: 4.4 mEq/L (ref 3.5–5.1)
Sodium: 136 mEq/L (ref 135–145)

## 2018-06-03 NOTE — Assessment & Plan Note (Signed)
With improved EF during this stay and grade 2 diastolic dysfunction. BP at goal.

## 2018-06-03 NOTE — Assessment & Plan Note (Signed)
BP at goal on his imdur, hydralazine, lasix, coreg, clonidine, amlodipine.

## 2018-06-03 NOTE — Assessment & Plan Note (Signed)
Stable and good control overall recently. He is monitoring sugars at home and taking lantus only currently.

## 2018-06-03 NOTE — Progress Notes (Signed)
   Subjective:   Patient ID: Brandon Carroll, male    DOB: August 05, 1960, 58 y.o.   MRN: [id number]  HPI The patient is a 58 YO man coming in for hospital follow up (in for AKI and high BP and given medication adjustment and IV diuresis, given CKD 4-5 he had AVF placement left arm while inpatient). He is still having stable weight since leaving the hospital. He is taking medications properly with good BP at home. Feels well overall. Denies chest pain, SOB, abdominal pains. Denies constipation or diarrhea. Denies pain. No bleeding. Had 1/2 cultures positive while in hospital and rechecked and negative to date (will be final on 06/05/2018). No fevers or chills.   PMH, [facility], social history reviewed and updated  Review of Systems  Constitutional: Negative.   HENT: Negative.   Eyes: Negative.   Respiratory: Negative for cough, chest tightness and shortness of breath.   Cardiovascular: Negative for chest pain, palpitations and leg swelling.  Gastrointestinal: Negative for abdominal distention, abdominal pain, constipation, diarrhea, nausea and vomiting.  Musculoskeletal: Negative.   Skin: Negative.   Neurological: Negative.   Psychiatric/Behavioral: Negative.     Objective:  Physical Exam Constitutional:      Appearance: He is well-developed.  HENT:     Head: Normocephalic and atraumatic.  Neck:     Musculoskeletal: Normal range of motion.  Cardiovascular:     Rate and Rhythm: Normal rate and regular rhythm.     Comments: Left forearm AVF intact with no signs of infection and good thrill Pulmonary:     Effort: Pulmonary effort is normal. No respiratory distress.     Breath sounds: Normal breath sounds. No wheezing or rales.  Abdominal:     General: Bowel sounds are normal. There is no distension.     Palpations: Abdomen is soft.     Tenderness: There is no abdominal tenderness. There is no rebound.  Skin:    General: Skin is warm and dry.  Neurological:     Mental Status: He is  alert and oriented to person, place, and time.     Coordination: Coordination normal.     Vitals:   06/03/18 1405  BP: 130/80  Pulse: 74  Temp: 98.4 F (36.9 C)  TempSrc: Oral  SpO2: 97%  Weight: 178 lb (80.7 kg)  Height: 5\' 10"  (1.778 m)    Assessment & Plan:

## 2018-06-03 NOTE — Patient Instructions (Signed)
We are checking the labs today and will call you back with results.

## 2018-06-03 NOTE — Assessment & Plan Note (Signed)
Checking renal function panel today, has CKD 4-5 and AVF placed during hospital stay.

## 2018-06-05 ENCOUNTER — Other Ambulatory Visit: Payer: Self-pay

## 2018-06-05 DIAGNOSIS — N184 Chronic kidney disease, stage 4 (severe): Secondary | ICD-10-CM

## 2018-06-05 LAB — CULTURE BLOOD MANUAL
Micro Number: 70735
Micro Number: 70737
Result: NO GROWTH
Result: NO GROWTH
Specimen Quality: ADEQUATE
Specimen Quality: ADEQUATE

## 2018-06-06 LAB — IRON,TIBC AND FERRITIN PANEL
Ferritin: 65
Iron: 57

## 2018-06-06 LAB — CBC AND DIFFERENTIAL
HEMATOCRIT: 33 — AB (ref 41–53)
Hemoglobin: 11.1 — AB (ref 13.5–17.5)
Neutrophils Absolute: 5
Platelets: 360 (ref 150–399)
WBC: 7

## 2018-06-10 ENCOUNTER — Ambulatory Visit (INDEPENDENT_AMBULATORY_CARE_PROVIDER_SITE_OTHER): Payer: Managed Care, Other (non HMO) | Admitting: Cardiology

## 2018-06-10 ENCOUNTER — Encounter: Payer: Self-pay | Admitting: Cardiology

## 2018-06-10 VITALS — BP 138/76 | HR 101 | Ht 70.0 in | Wt 176.8 lb

## 2018-06-10 DIAGNOSIS — I10 Essential (primary) hypertension: Secondary | ICD-10-CM

## 2018-06-10 DIAGNOSIS — I2510 Atherosclerotic heart disease of native coronary artery without angina pectoris: Secondary | ICD-10-CM

## 2018-06-10 DIAGNOSIS — I5022 Chronic systolic (congestive) heart failure: Secondary | ICD-10-CM

## 2018-06-10 MED ORDER — ISOSORBIDE MONONITRATE ER 60 MG PO TB24: 60.0000 mg | ORAL_TABLET | Freq: Every day | ORAL | 0 refills | Status: DC

## 2018-06-10 NOTE — Patient Instructions (Signed)
Medication Instructions:  Your physician has recommended you make the following change in your medication:   INCREASE Imdur 60 mg, take 1 tablet daily  If you need a refill on your cardiac medications before your next appointment, please call your pharmacy.   Lab work: none If you have labs (blood work) drawn today and your tests are completely normal, you will receive your results only by: Marland Kitchen MyChart Message (if you have MyChart) OR . A paper copy in the mail If you have any lab test that is abnormal or we need to change your treatment, we will call you to review the results.  Testing/Procedures: none  Follow-Up: At [facility], you and your health needs are our priority.  As part of our continuing mission to provide you with exceptional heart care, we have created designated Provider Care Teams.  These Care Teams include your primary Cardiologist (physician) and Advanced Practice Providers (APPs -  Physician Assistants and Nurse Practitioners) who all work together to provide you with the care you need, when you need it. You will need a follow up appointment in 3 months.  Please call our office 2 months in advance to schedule this appointment.  Any Other Special Instructions Will Be Listed Below (If Applicable).

## 2018-06-10 NOTE — Addendum Note (Signed)
Addended by: Anselm Pancoast on: 06/10/2018 04:48 PM   Modules accepted: Orders

## 2018-06-10 NOTE — Progress Notes (Signed)
Cardiology Office Note:    Date:  06/10/2018   ID:  Brandon Carroll, DOB [birth year], MRN [id number]  PCP:  Hoyt Koch, MD  Cardiologist:  Jenne Campus, MD    Referring MD: Hoyt Koch, *   Chief Complaint  Patient presents with  . 1 month follow up  Doing much better  History of Present Illness:    Brandon Carroll is a 58 y.o. 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  He started watching his diet as he start watching his fluids he is taking his medications on the regular basis and he said he feels best ever.  No swelling of lower extremities no proximal nocturnal dyspnea no exertional shortness of breath  Past Medical History:  Diagnosis Date  . Acute systolic CHF (congestive heart failure), NYHA class 2 ([place]) 05/04/2015  . Acute-on-chronic kidney injury ([place]) 05/04/2015  . Diabetes mellitus   . Erectile dysfunction   . Hyperlipidemia   . Hypertension     Past Surgical History:  Procedure Laterality Date  . AV FISTULA PLACEMENT Left 05/26/2018   Procedure: ARTERIOVENOUS (AV) FISTULA CREATION LEFT ARM;  Surgeon: Marty Heck, MD;  Location: [place];  Service: Vascular;  Laterality: Left;  . CARDIAC CATHETERIZATION N/A 06/15/2015   Procedure: Left Heart Cath and Coronary Angiography;  Surgeon: Peter M Martinique, MD;  Location: [place] [facility];  Service: Cardiovascular;  Laterality: N/A;  . CHOLECYSTECTOMY N/A 06/28/2017   Procedure: LAPAROSCOPIC CHOLECYSTECTOMY;  Surgeon: Ralene Ok, MD;  Location: [facility];  Service: General;  Laterality: N/A;  . HERNIA REPAIR  7th grade   umbilical    Current Medications: Current Meds  Medication Sig  . amLODipine (NORVASC) 5 MG tablet Take 1 tablet (5 mg total) by mouth daily.  Marland Kitchen aspirin EC 81 MG tablet Take 81 mg by mouth daily.  . BD PEN NEEDLE  NANO U/F 32G X 4 MM MISC USE ONCE DAILY AS DIRECTED  . carvedilol (COREG) 6.25 MG tablet Take 1 tablet (6.25 mg total) by mouth 2 (two) times daily. (Patient taking differently: Take 6.25 mg by mouth 2 (two) times daily with a meal. )  . cloNIDine (CATAPRES - DOSED IN MG/24 HR) 0.3 mg/24hr patch Place 1 patch (0.3 mg total) onto the skin once a week.  . Continuous Blood Gluc Receiver (FREESTYLE LIBRE 14 DAY READER) DEVI USE AS DIRECTED  . furosemide (LASIX) 80 MG tablet Take 2 tablets (160 mg total) by mouth 2 (two) times daily.  Marland Kitchen glucose blood (ONETOUCH VERIO) test strip 1 each by Other route 2 (two) times daily as needed for other. And lancets 2/day  . hydrALAZINE (APRESOLINE) 50 MG tablet Take 50 mg by mouth 3 (three) times daily.  Marland Kitchen HYDROcodone-acetaminophen (NORCO) 5-325 MG tablet Take 1 tablet by mouth every 6 (six) hours as needed for moderate pain.  . Insulin Glargine (LANTUS SOLOSTAR) 100 UNIT/ML Solostar Pen Inject 60 Units into the skin every morning. And pen needles 1/day (Patient taking differently: Inject 60 Units into the skin daily at 3 pm. And pen needles 1/day)  . isosorbide mononitrate (IMDUR) 30 MG 24 hr tablet Take 1 tablet (30 mg total) by mouth daily.  . rosuvastatin (CRESTOR) 20 MG tablet Take 2 tablets (40 mg total) by mouth daily. (Patient taking differently: Take 20 mg by mouth daily. )  . Spacer/Aero-Holding Chambers (AEROCHAMBER  MV) inhaler Use as instructed     Allergies:   Lipitor [atorvastatin]   Social History   Socioeconomic History  . Marital status: Divorced    Spouse name: n/a  . Number of children: 1  . Years of education: 12+  . Highest education level: Not on file  Occupational History  . Occupation: Building services engineer: [employer]  Social Needs  . Financial resource strain: Not on file  . Food insecurity:    Worry: Not on file    Inability: Not on file  . Transportation needs:    Medical: Not on file    Non-medical: Not on file    Tobacco Use  . Smoking status: Never Smoker  . Smokeless tobacco: Never Used  Substance and Sexual Activity  . Alcohol use: No    Alcohol/week: 0.0 standard drinks  . Drug use: No  . Sexual activity: Not Currently    Comment: Erectile dysfunciotn  Lifestyle  . Physical activity:    Days per week: Not on file    Minutes per session: Not on file  . Stress: Not on file  Relationships  . Social connections:    Talks on phone: Not on file    Gets together: Not on file    Attends religious service: Not on file    Active member of club or organization: Not on file    Attends meetings of clubs or organizations: Not on file    Relationship status: Not on file  Other Topics Concern  . Not on file  Social History Narrative   Divorced. Education: [employer].    Unable to exercise due to significant SOB and DOE.    Lives alone.   Son lives in [place].     Family History: The patient's family history includes Arthritis in his mother and sister; COPD in his mother and sister; Diabetes in his brother and mother; Hypertension in his father and mother. There is no history of Colon cancer. ROS:   Please see the history of present illness.    All 14 point review of systems negative except as described per history of present illness  EKGs/Labs/Other Studies Reviewed:      Recent Labs: 05/20/2018: Pro B Natriuretic peptide (BNP) 1,460.0; TSH 1.71 05/24/2018: B Natriuretic Peptide 1,449.4; Magnesium 2.0 05/27/2018: Hemoglobin 10.9; Platelets 353 06/03/2018: BUN 66; Creatinine, Ser 4.89; Potassium 4.4; Sodium 136  Recent Lipid Panel    Component Value Date/Time   CHOL 197 09/24/2017 0823   CHOL 305 (H) 03/21/2017 1124   TRIG 245.0 (H) 09/24/2017 0823   HDL 45.60 09/24/2017 0823   HDL 60 03/21/2017 1124   CHOLHDL 4 09/24/2017 0823   VLDL 49.0 (H) 09/24/2017 0823   LDLCALC 212 (H) 03/21/2017 1124   LDLDIRECT 106.0 09/24/2017 0823    Physical Exam:    VS:  BP 138/76   Pulse (!) 101    Ht 5\' 10"  (1.778 m)   Wt 176 lb 12.8 oz (80.2 kg)   SpO2 98%   BMI 25.37 kg/m     Wt Readings from Last 3 Encounters:  06/10/18 176 lb 12.8 oz (80.2 kg)  06/03/18 178 lb (80.7 kg)  05/29/18 178 lb (80.7 kg)     GEN:  Well nourished, well developed in no acute distress HEENT: Normal NECK: No JVD; No carotid bruits LYMPHATICS: No lymphadenopathy CARDIAC: RRR, no murmurs, no rubs, no gallops RESPIRATORY:  Clear to auscultation without rales, wheezing or rhonchi  ABDOMEN: Soft, non-tender,  non-distended MUSCULOSKELETAL:  No edema; No deformity  SKIN: Warm and dry LOWER EXTREMITIES: no swelling NEUROLOGIC:  Alert and oriented x 3 PSYCHIATRIC:  Normal affect   ASSESSMENT:    1. Chronic systolic CHF (congestive heart failure) ([place])   2. Coronary artery disease involving native coronary artery of native heart without angina pectoris   3. Essential hypertension    PLAN:    In order of problems listed above:  1. Chronic systolic congestive heart failure gradual I am trying to increase the dose of medication today we will double the dose of Imdur he takes 30 mg daily will go up to 60 mg daily rest of the medication will be the same 2. Coronary artery disease stable denies having any symptoms overall feels good 3. Essential hypertension blood pressure much better controlled he knows about salt restriction he is trying to do that.   Medication Adjustments/Labs and Tests Ordered: Current medicines are reviewed at length with the patient today.  Concerns regarding medicines are outlined above.  No orders of the defined types were placed in this encounter.  Medication changes: No orders of the defined types were placed in this encounter.   Signed, Park Liter, MD, [facility] 06/10/2018 3:51 PM    [facility]

## 2018-06-16 ENCOUNTER — Encounter: Payer: Self-pay | Admitting: Internal Medicine

## 2018-06-18 ENCOUNTER — Encounter: Payer: Self-pay | Admitting: *Deleted

## 2018-07-08 ENCOUNTER — Other Ambulatory Visit: Payer: Self-pay

## 2018-07-08 ENCOUNTER — Encounter: Payer: Self-pay | Admitting: Vascular Surgery

## 2018-07-08 ENCOUNTER — Encounter: Payer: Self-pay | Admitting: *Deleted

## 2018-07-08 ENCOUNTER — Ambulatory Visit (HOSPITAL_COMMUNITY)
Admission: RE | Admit: 2018-07-08 | Discharge: 2018-07-08 | Disposition: A | Discharge status: Home / Self Care | Payer: Managed Care, Other (non HMO) | Source: Ambulatory Visit | Attending: Vascular Surgery | Admitting: Vascular Surgery

## 2018-07-08 ENCOUNTER — Ambulatory Visit (INDEPENDENT_AMBULATORY_CARE_PROVIDER_SITE_OTHER): Payer: Self-pay | Admitting: Vascular Surgery

## 2018-07-08 VITALS — BP 148/84 | HR 81 | Temp 97.9°F | Resp 16 | Ht 69.0 in | Wt 175.0 lb

## 2018-07-08 DIAGNOSIS — N184 Chronic kidney disease, stage 4 (severe): Secondary | ICD-10-CM | POA: Insufficient documentation

## 2018-07-08 NOTE — Progress Notes (Signed)
Patient name: Brandon Carroll MRN: [id number] DOB: [birth year] Sex: male  REASON FOR VISIT: Post-op  HPI: Brandon Carroll is a 58 y.o. male that presents for postop check after left radiocephalic fistula on 8/76/8115.  Patient reports no issues in the interim.  Has had no left hand numbness tingling or weakness.  States the fistula has a good thrill.  Supposed to see his nephrologist tomorrow and has not started dialysis yet.  Past Medical History:  Diagnosis Date  . Acute systolic CHF (congestive heart failure), NYHA class 2 ([place]) 05/04/2015  . Acute-on-chronic kidney injury ([place]) 05/04/2015  . Diabetes mellitus   . Erectile dysfunction   . Hyperlipidemia   . Hypertension     Past Surgical History:  Procedure Laterality Date  . AV FISTULA PLACEMENT Left 05/26/2018   Procedure: ARTERIOVENOUS (AV) FISTULA CREATION LEFT ARM;  Surgeon: Marty Heck, MD;  Location: [facility];  Service: Vascular;  Laterality: Left;  . CARDIAC CATHETERIZATION N/A 06/15/2015   Procedure: Left Heart Cath and Coronary Angiography;  Surgeon: Peter M Martinique, MD;  Location: [facility];  Service: Cardiovascular;  Laterality: N/A;  . CHOLECYSTECTOMY N/A 06/28/2017   Procedure: LAPAROSCOPIC CHOLECYSTECTOMY;  Surgeon: Ralene Ok, MD;  Location: [facility];  Service: General;  Laterality: N/A;  . HERNIA REPAIR  7th grade   umbilical    Family History  Problem Relation Age of Onset  . Arthritis Mother   . COPD Mother   . Diabetes Mother        was thin, took insulin  . Hypertension Mother   . Hypertension Father   . Arthritis Sister        rheumatoid  . COPD Sister   . Diabetes Brother   . Colon cancer Neg Hx     SOCIAL HISTORY: Social History   Tobacco Use  . Smoking status: Never Smoker  . Smokeless tobacco: Never Used  Substance Use Topics  . Alcohol use: No    Alcohol/week: 0.0 standard drinks    Allergies  Allergen Reactions  . Lipitor [Atorvastatin] Hives and Itching     Current Outpatient Medications  Medication Sig Dispense Refill  . amLODipine (NORVASC) 5 MG tablet Take 1 tablet (5 mg total) by mouth daily. 90 tablet 1  . aspirin EC 81 MG tablet Take 81 mg by mouth daily.    . BD PEN NEEDLE NANO U/F 32G X 4 MM MISC USE ONCE DAILY AS DIRECTED 100 each PRN  . carvedilol (COREG) 6.25 MG tablet Take 1 tablet (6.25 mg total) by mouth 2 (two) times daily. (Patient taking differently: Take 6.25 mg by mouth 2 (two) times daily with a meal. ) 180 tablet 1  . cloNIDine (CATAPRES - DOSED IN MG/24 HR) 0.3 mg/24hr patch Place 1 patch (0.3 mg total) onto the skin once a week. 12 patch 3  . Continuous Blood Gluc Receiver (FREESTYLE LIBRE 14 DAY READER) DEVI USE AS DIRECTED 6 Device 3  . furosemide (LASIX) 80 MG tablet Take 2 tablets (160 mg total) by mouth 2 (two) times daily. 120 tablet 0  . glucose blood (ONETOUCH VERIO) test strip 1 each by Other route 2 (two) times daily as needed for other. And lancets 2/day 100 each 12  . hydrALAZINE (APRESOLINE) 50 MG tablet Take 50 mg by mouth 3 (three) times daily.    . Insulin Glargine (LANTUS SOLOSTAR) 100 UNIT/ML Solostar Pen Inject 60 Units into the skin every morning. And pen needles 1/day (Patient taking differently: Inject  60 Units into the skin daily at 3 pm. And pen needles 1/day) 10 pen PRN  . isosorbide mononitrate (IMDUR) 60 MG 24 hr tablet Take 1 tablet (60 mg total) by mouth daily. 90 tablet 0  . rosuvastatin (CRESTOR) 20 MG tablet Take 2 tablets (40 mg total) by mouth daily. (Patient taking differently: Take 20 mg by mouth daily. ) 90 tablet 3  . Spacer/Aero-Holding Chambers (AEROCHAMBER MV) inhaler Use as instructed 1 each 0  . HYDROcodone-acetaminophen (NORCO) 5-325 MG tablet Take 1 tablet by mouth every 6 (six) hours as needed for moderate pain. (Patient not taking: Reported on 07/08/2018) 10 tablet 0   No current facility-administered medications for this visit.     REVIEW OF SYSTEMS:  [X]  denotes positive  finding, [ ]  denotes negative finding Cardiac  Comments:  Chest pain or chest pressure:    Shortness of breath upon exertion:    Short of breath when lying flat:    Irregular heart rhythm:        Vascular    Pain in calf, thigh, or hip brought on by ambulation:    Pain in feet at night that wakes you up from your sleep:     Blood clot in your veins:    Leg swelling:         Pulmonary    Oxygen at home:    Productive cough:     Wheezing:         Neurologic    Sudden weakness in arms or legs:     Sudden numbness in arms or legs:     Sudden onset of difficulty speaking or slurred speech:    Temporary loss of vision in one eye:     Problems with dizziness:         Gastrointestinal    Blood in stool:     Vomited blood:         Genitourinary    Burning when urinating:     Blood in urine:        Psychiatric    Major depression:         Hematologic    Bleeding problems:    Problems with blood clotting too easily:        Skin    Rashes or ulcers:        Constitutional    Fever or chills:      PHYSICAL EXAM: Vitals:   07/08/18 1337  BP: (!) 148/84  Pulse: 81  Resp: 16  Temp: 97.9 F (36.6 C)  TempSrc: Oral  SpO2: 98%  Weight: 175 lb 0.7 oz (79.4 kg)  Height: 5\' 9"  (1.753 m)    GENERAL: The patient is a well-nourished male, in no acute distress. The vital signs are documented above. CARDIAC: There is a regular rate and rhythm.  VASCULAR:  2+ palpable left radial pulse Thrill in left radiocephalic AVF  DATA:   Fistula duplex shows nicely dilated vein around 6 mm with adequate flow volumes.  Assessment/Plan:  Overall doing well after left radiocephalic AV fistula on 2/77/8242.  He has a good thrill in the fistula today with a great pulse at the wrist and no hand symptoms.  The fistula has dilated nicely to 6 mm with good flow volumes.  No further surgical revision from my standpoint.  Discussed after another month or so would be fine for them to start using  the fistula if needed.   Marty Heck, MD [facility] Office: [phone] Pager: [phone]

## 2018-07-17 ENCOUNTER — Other Ambulatory Visit: Payer: Self-pay

## 2018-07-17 ENCOUNTER — Ambulatory Visit (INDEPENDENT_AMBULATORY_CARE_PROVIDER_SITE_OTHER): Payer: Managed Care, Other (non HMO) | Admitting: Endocrinology

## 2018-07-17 ENCOUNTER — Encounter: Payer: Self-pay | Admitting: Endocrinology

## 2018-07-17 VITALS — BP 144/64 | HR 74 | Ht 69.0 in | Wt 178.8 lb

## 2018-07-17 DIAGNOSIS — N185 Chronic kidney disease, stage 5: Secondary | ICD-10-CM | POA: Diagnosis not present

## 2018-07-17 DIAGNOSIS — E0822 Diabetes mellitus due to underlying condition with diabetic chronic kidney disease: Secondary | ICD-10-CM

## 2018-07-17 DIAGNOSIS — Z794 Long term (current) use of insulin: Secondary | ICD-10-CM

## 2018-07-17 LAB — POCT GLYCOSYLATED HEMOGLOBIN (HGB A1C): Hemoglobin A1C: 7 % — AB (ref 4.0–5.6)

## 2018-07-17 MED ORDER — INSULIN GLARGINE 100 UNIT/ML SOLOSTAR PEN: 56.0000 [IU] | PEN_INJECTOR | SUBCUTANEOUS | 99 refills | Status: DC

## 2018-07-17 NOTE — Patient Instructions (Addendum)
Your blood pressure is high today.  Please see your primary care provider soon, to have it rechecked Please reduce the insulin to 56 units each morning On this type of insulin schedule, you should eat meals on a regular schedule.  If a meal is missed or significantly delayed, your blood sugar could go low.  check your blood sugar twice a day.  vary the time of day when you check, between before the 3 meals, and at bedtime.  also check if you have symptoms of your blood sugar being too high or too low.  please keep a record of the readings and bring it to your next appointment here (or you can bring the meter itself).  You can write it on any piece of paper.  please call us sooner if your blood sugar goes below 70, or if you have a lot of readings over 200.    Please come back for a follow-up appointment in 3 months.

## 2018-07-17 NOTE — Progress Notes (Signed)
Subjective:    Patient ID: Brandon Carroll, male    DOB: 06-17-1960, 58 y.o.   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   Dx'ed: 7989 Complications: renal failure (no HD yet).   Therapy: insulin since 2014.   DKA: never.  Severe hypoglycemia: never.   Pancreatitis: never.  Other: He works 1st shift, as a Forensic scientist; he is on QD insulin, due to h/o noncompliance; fructosamine converts to lower a1c than cbg's and A1c itself Interval history: Last week, he had an episode of severe hypoglycemia, while driving at 10 AM.  He had missed breakfast.   Past Medical History:  Diagnosis Date  . Acute systolic CHF (congestive heart failure), NYHA class 2 ([place]) 05/04/2015  . Acute-on-chronic kidney injury ([place]) 05/04/2015  . Diabetes mellitus   . Erectile dysfunction   . Hyperlipidemia   . Hypertension     Past Surgical History:  Procedure Laterality Date  . AV FISTULA PLACEMENT Left 05/26/2018   Procedure: ARTERIOVENOUS (AV) FISTULA CREATION LEFT ARM;  Surgeon: Marty Heck, MD;  Location: [facility];  Service: Vascular;  Laterality: Left;  . CARDIAC CATHETERIZATION N/A 06/15/2015   Procedure: Left Heart Cath and Coronary Angiography;  Surgeon: Peter M Martinique, MD;  Location: [facility];  Service: Cardiovascular;  Laterality: N/A;  . CHOLECYSTECTOMY N/A 06/28/2017   Procedure: LAPAROSCOPIC CHOLECYSTECTOMY;  Surgeon: Ralene Ok, MD;  [facility];  Service: General;  Laterality: N/A;  . HERNIA REPAIR  7th grade   umbilical    Social History   Socioeconomic History  . Marital status: Divorced    Spouse name: n/a  . Number of children: 1  . Years of education: 12+  . Highest education level: Not on file  Occupational History  . Occupation: Building services engineer: [employer]  Social Needs  . Financial resource strain: Not on file  . Food insecurity:    Worry: Not on  file    Inability: Not on file  . Transportation needs:    Medical: Not on file    Non-medical: Not on file  Tobacco Use  . Smoking status: Never Smoker  . Smokeless tobacco: Never Used  Substance and Sexual Activity  . Alcohol use: No    Alcohol/week: 0.0 standard drinks  . Drug use: No  . Sexual activity: Not Currently    Comment: Erectile dysfunciotn  Lifestyle  . Physical activity:    Days per week: Not on file    Minutes per session: Not on file  . Stress: Not on file  Relationships  . Social connections:    Talks on phone: Not on file    Gets together: Not on file    Attends religious service: Not on file    Active member of club or organization: Not on file    Attends meetings of clubs or organizations: Not on file    Relationship status: Not on file  . Intimate partner violence:    Fear of current or ex partner: Not on file    Emotionally abused: Not on file    Physically abused: Not on file    Forced sexual activity: Not on file  Other Topics Concern  . Not on file  Social History Narrative   Divorced. Education: [employer].    Unable to exercise due to significant SOB and DOE.    Lives alone.   Son  lives in [place].    Current Outpatient Medications on File Prior to Visit  Medication Sig Dispense Refill  . amLODipine (NORVASC) 5 MG tablet Take 1 tablet (5 mg total) by mouth daily. 90 tablet 1  . aspirin EC 81 MG tablet Take 81 mg by mouth daily.    . BD PEN NEEDLE NANO U/F 32G X 4 MM MISC USE ONCE DAILY AS DIRECTED 100 each PRN  . carvedilol (COREG) 6.25 MG tablet Take 1 tablet (6.25 mg total) by mouth 2 (two) times daily. (Patient taking differently: Take 6.25 mg by mouth 2 (two) times daily with a meal. ) 180 tablet 1  . cloNIDine (CATAPRES - DOSED IN MG/24 HR) 0.3 mg/24hr patch Place 1 patch (0.3 mg total) onto the skin once a week. 12 patch 3  . Continuous Blood Gluc Receiver (FREESTYLE LIBRE 14 DAY READER) DEVI USE AS DIRECTED 6 Device 3  . furosemide  (LASIX) 80 MG tablet Take 2 tablets (160 mg total) by mouth 2 (two) times daily. 120 tablet 0  . glucose blood (ONETOUCH VERIO) test strip 1 each by Other route 2 (two) times daily as needed for other. And lancets 2/day 100 each 12  . hydrALAZINE (APRESOLINE) 50 MG tablet Take 50 mg by mouth 3 (three) times daily.    Marland Kitchen HYDROcodone-acetaminophen (NORCO) 5-325 MG tablet Take 1 tablet by mouth every 6 (six) hours as needed for moderate pain. 10 tablet 0  . isosorbide mononitrate (IMDUR) 60 MG 24 hr tablet Take 1 tablet (60 mg total) by mouth daily. 90 tablet 0  . rosuvastatin (CRESTOR) 20 MG tablet Take 2 tablets (40 mg total) by mouth daily. (Patient taking differently: Take 20 mg by mouth daily. ) 90 tablet 3  . Spacer/Aero-Holding Chambers (AEROCHAMBER MV) inhaler Use as instructed 1 each 0   No current facility-administered medications on file prior to visit.     Allergies  Allergen Reactions  . Lipitor [Atorvastatin] Hives and Itching    Family History  Problem Relation Age of Onset  . Arthritis Mother   . COPD Mother   . Diabetes Mother        was thin, took insulin  . Hypertension Mother   . Hypertension Father   . Arthritis Sister        rheumatoid  . COPD Sister   . Diabetes Brother   . Colon cancer Neg Hx     BP (!) 144/64 (BP Location: Right Arm, Patient Position: Sitting, Cuff Size: Normal)   Pulse 74   Ht 5\' 9"  (1.753 m)   Wt 178 lb 12.8 oz (81.1 kg)   SpO2 97%   BMI 26.40 kg/m   Review of Systems He has lost 15 lbs recently.      Objective:   Physical Exam VITAL SIGNS:  See vs page GENERAL: no distress Pulses: dorsalis pedis intact bilat.   MSK: no deformity of the feet CV: no leg edema Skin:  no ulcer on the feet, but the skin is dry.  normal color and temp on the feet. Neuro: sensation is intact to touch on the feet.   Ext: There is bilateral onychomycosis of the toenails.     Lab Results  Component Value Date   HGBA1C 7.0 (A) 07/17/2018   Lab  Results  Component Value Date   CREATININE 4.89 (HH) 06/03/2018   BUN 66 (H) 06/03/2018   NA 136 06/03/2018   K 4.4 06/03/2018   CL 104 06/03/2018   CO2 24  06/03/2018       Assessment & Plan:  Hypoglycemia: worse Type 1 DM, with renal failure: overcontrolled.  Renal failure: in this setting, a1c is artificially low Weight loss, prob due to less fluid overload now  Patient Instructions  Your blood pressure is high today.  Please see your primary care provider soon, to have it rechecked Please reduce the insulin to 56 units each morning On this type of insulin schedule, you should eat meals on a regular schedule.  If a meal is missed or significantly delayed, your blood sugar could go low.  check your blood sugar twice a day.  vary the time of day when you check, between before the 3 meals, and at bedtime.  also check if you have symptoms of your blood sugar being too high or too low.  please keep a record of the readings and bring it to your next appointment here (or you can bring the meter itself).  You can write it on any piece of paper.  please call us sooner if your blood sugar goes below 70, or if you have a lot of readings over 200.    Please come back for a follow-up appointment in 3 months.

## 2018-07-18 LAB — CBC AND DIFFERENTIAL
HCT: 34 — AB (ref 41–53)
Hemoglobin: 11.5 — AB (ref 13.5–17.5)
Neutrophils Absolute: 6
Platelets: 299 (ref 150–399)
WBC: 8.4

## 2018-07-18 LAB — BASIC METABOLIC PANEL
BUN: 56 — AB (ref 4–21)
Creatinine: 4.8 — AB (ref 0.6–1.3)
Glucose: 180
POTASSIUM: 4.3 (ref 3.4–5.3)
SODIUM: 139 (ref 137–147)

## 2018-07-18 LAB — IRON,TIBC AND FERRITIN PANEL
Ferritin: 78
Iron: 54

## 2018-07-23 ENCOUNTER — Encounter: Payer: Self-pay | Admitting: Internal Medicine

## 2018-07-23 NOTE — Progress Notes (Signed)
Abstracted and sent to scan  

## 2018-09-11 ENCOUNTER — Other Ambulatory Visit: Payer: Self-pay | Admitting: Cardiology

## 2018-10-15 ENCOUNTER — Other Ambulatory Visit: Payer: Self-pay | Admitting: *Deleted

## 2018-10-15 MED ORDER — FUROSEMIDE 80 MG PO TABS: 160.0000 mg | ORAL_TABLET | Freq: Two times a day (BID) | ORAL | 0 refills | Status: DC

## 2018-10-16 ENCOUNTER — Other Ambulatory Visit: Payer: Self-pay

## 2018-10-16 ENCOUNTER — Ambulatory Visit (INDEPENDENT_AMBULATORY_CARE_PROVIDER_SITE_OTHER): Payer: Managed Care, Other (non HMO) | Admitting: Endocrinology

## 2018-10-16 ENCOUNTER — Encounter: Payer: Self-pay | Admitting: Endocrinology

## 2018-10-16 VITALS — BP 150/82 | HR 73 | Temp 98.2°F | Wt 183.8 lb

## 2018-10-16 DIAGNOSIS — N189 Chronic kidney disease, unspecified: Secondary | ICD-10-CM

## 2018-10-16 DIAGNOSIS — E0822 Diabetes mellitus due to underlying condition with diabetic chronic kidney disease: Secondary | ICD-10-CM

## 2018-10-16 DIAGNOSIS — R1110 Vomiting, unspecified: Secondary | ICD-10-CM | POA: Insufficient documentation

## 2018-10-16 DIAGNOSIS — R112 Nausea with vomiting, unspecified: Secondary | ICD-10-CM | POA: Insufficient documentation

## 2018-10-16 DIAGNOSIS — E1122 Type 2 diabetes mellitus with diabetic chronic kidney disease: Secondary | ICD-10-CM | POA: Diagnosis not present

## 2018-10-16 DIAGNOSIS — Z794 Long term (current) use of insulin: Secondary | ICD-10-CM

## 2018-10-16 DIAGNOSIS — N185 Chronic kidney disease, stage 5: Secondary | ICD-10-CM

## 2018-10-16 LAB — POCT GLYCOSYLATED HEMOGLOBIN (HGB A1C): Hemoglobin A1C: 6.9 % — AB (ref 4.0–5.6)

## 2018-10-16 NOTE — Patient Instructions (Addendum)
Your blood pressure is high today.  Please see your kidney doctor soon, to have it rechecked Blood tests are requested for you today.  We'll let you know about the results.  Let's check a special type of stomach x-ray.  you will receive a phone call, about a day and time for an appointment. On this type of insulin schedule, you should eat meals on a regular schedule.  If a meal is missed or significantly delayed, your blood sugar could go low.  check your blood sugar twice a day.  vary the time of day when you check, between before the 3 meals, and at bedtime.  also check if you have symptoms of your blood sugar being too high or too low.  please keep a record of the readings and bring it to your next appointment here (or you can bring the meter itself).  You can write it on any piece of paper.  please call us sooner if your blood sugar goes below 70, or if you have a lot of readings over 200.    Please come back for a follow-up appointment in 3 months.

## 2018-10-16 NOTE — Progress Notes (Signed)
Subjective:    Patient ID: Brandon Carroll, male    DOB: 1961-05-14, 58 y.o.   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   Dx'ed: 1761 Complications: renal failure (no HD yet).   Therapy: insulin since 2014.   DKA: never.  Severe hypoglycemia: once (early 2020) Pancreatitis: never.  Other: He works 1st shift, as a Forensic scientist; he is on QD insulin, due to h/o noncompliance; fructosamine converts to lower a1c than cbg's and A1c itself.   Interval history: pt states 1 year of nausea sensation in the abdomen, and assoc vomiting.  This causes him to effectively miss breakfast.  no cbg record, but states cbg's vary from 80-200.   Past Medical History:  Diagnosis Date  . Acute systolic CHF (congestive heart failure), NYHA class 2 ([place]) 05/04/2015  . Acute-on-chronic kidney injury ([place]) 05/04/2015  . Diabetes mellitus   . Erectile dysfunction   . Hyperlipidemia   . Hypertension     Past Surgical History:  Procedure Laterality Date  . AV FISTULA PLACEMENT Left 05/26/2018   Procedure: ARTERIOVENOUS (AV) FISTULA CREATION LEFT ARM;  Surgeon: Marty Heck, MD;  Location: [facility];  Service: Vascular;  Laterality: Left;  . CARDIAC CATHETERIZATION N/A 06/15/2015   Procedure: Left Heart Cath and Coronary Angiography;  Surgeon: Peter M Martinique, MD;  Location: [facility];  Service: Cardiovascular;  Laterality: N/A;  . CHOLECYSTECTOMY N/A 06/28/2017   Procedure: LAPAROSCOPIC CHOLECYSTECTOMY;  Surgeon: Ralene Ok, MD;  [facility];  Service: General;  Laterality: N/A;  . HERNIA REPAIR  7th grade   umbilical    Social History   Socioeconomic History  . Marital status: Divorced    Spouse name: n/a  . Number of children: 1  . Years of education: 12+  . Highest education level: Not on file  Occupational History  . Occupation: Building services engineer: [employer]  Social Needs   . Financial resource strain: Not on file  . Food insecurity:    Worry: Not on file    Inability: Not on file  . Transportation needs:    Medical: Not on file    Non-medical: Not on file  Tobacco Use  . Smoking status: Never Smoker  . Smokeless tobacco: Never Used  Substance and Sexual Activity  . Alcohol use: No    Alcohol/week: 0.0 standard drinks  . Drug use: No  . Sexual activity: Not Currently    Comment: Erectile dysfunciotn  Lifestyle  . Physical activity:    Days per week: Not on file    Minutes per session: Not on file  . Stress: Not on file  Relationships  . Social connections:    Talks on phone: Not on file    Gets together: Not on file    Attends religious service: Not on file    Active member of club or organization: Not on file    Attends meetings of clubs or organizations: Not on file    Relationship status: Not on file  . Intimate partner violence:    Fear of current or ex partner: Not on file    Emotionally abused: Not on file    Physically abused: Not on file    Forced sexual activity: Not on file  Other Topics Concern  . Not on file  Social History Narrative   Divorced. Education: [employer].    Unable to exercise  due to significant SOB and DOE.    Lives alone.   Son lives in [place].    Current Outpatient Medications on File Prior to Visit  Medication Sig Dispense Refill  . amLODipine (NORVASC) 5 MG tablet Take 1 tablet (5 mg total) by mouth daily. 90 tablet 1  . aspirin EC 81 MG tablet Take 81 mg by mouth daily.    . BD PEN NEEDLE NANO U/F 32G X 4 MM MISC USE ONCE DAILY AS DIRECTED 100 each PRN  . carvedilol (COREG) 6.25 MG tablet Take 1 tablet (6.25 mg total) by mouth 2 (two) times daily. (Patient taking differently: Take 6.25 mg by mouth 2 (two) times daily with a meal. ) 180 tablet 1  . cloNIDine (CATAPRES - DOSED IN MG/24 HR) 0.3 mg/24hr patch Place 1 patch (0.3 mg total) onto the skin once a week. 12 patch 3  . Continuous Blood Gluc Receiver  (FREESTYLE LIBRE 14 DAY READER) DEVI USE AS DIRECTED 6 Device 3  . furosemide (LASIX) 80 MG tablet Take 2 tablets (160 mg total) by mouth 2 (two) times daily. 120 tablet 0  . glucose blood (ONETOUCH VERIO) test strip 1 each by Other route 2 (two) times daily as needed for other. And lancets 2/day 100 each 12  . hydrALAZINE (APRESOLINE) 50 MG tablet Take 50 mg by mouth 3 (three) times daily.    Marland Kitchen HYDROcodone-acetaminophen (NORCO) 5-325 MG tablet Take 1 tablet by mouth every 6 (six) hours as needed for moderate pain. 10 tablet 0  . Insulin Glargine (LANTUS SOLOSTAR) 100 UNIT/ML Solostar Pen Inject 56 Units into the skin every morning. And pen needles 1/day 10 pen PRN  . isosorbide mononitrate (IMDUR) 60 MG 24 hr tablet TAKE 1 TABLET(60 MG) BY MOUTH DAILY 90 tablet 1  . rosuvastatin (CRESTOR) 20 MG tablet Take 2 tablets (40 mg total) by mouth daily. (Patient taking differently: Take 20 mg by mouth daily. ) 90 tablet 3  . Spacer/Aero-Holding Chambers (AEROCHAMBER MV) inhaler Use as instructed 1 each 0   No current facility-administered medications on file prior to visit.     Allergies  Allergen Reactions  . Lipitor [Atorvastatin] Hives and Itching    Family History  Problem Relation Age of Onset  . Arthritis Mother   . COPD Mother   . Diabetes Mother        was thin, took insulin  . Hypertension Mother   . Hypertension Father   . Arthritis Sister        rheumatoid  . COPD Sister   . Diabetes Brother   . Colon cancer Neg Hx     BP (!) 150/82 (BP Location: Right Arm, Patient Position: Sitting, Cuff Size: Normal)   Pulse 73   Temp 98.2 F (36.8 C) (Oral)   Wt 183 lb 12.8 oz (83.4 kg)   SpO2 95%   BMI 27.14 kg/m    Review of Systems He denies hypoglycemia    Objective:   Physical Exam VITAL SIGNS:  See vs page GENERAL: no distress Pulses: dorsalis pedis intact bilat.   MSK: no deformity of the feet CV: 1+ bilat  leg edema Skin:  no ulcer on the feet.  normal color and  temp on the feet. Neuro: sensation is intact to touch on the feet    Lab Results  Component Value Date   CREATININE 4.8 (A) 07/18/2018   BUN 56 (A) 07/18/2018   NA 139 07/18/2018   K 4.3 07/18/2018   CL  104 06/03/2018   CO2 24 06/03/2018      Assessment & Plan:  Insulin-requiring type 2 DM, with renal failure.  Uncertain glycemic control. Check fructosamine HTN: is noted today. Vomiting, new.  Patient Instructions  Your blood pressure is high today.  Please see your kidney doctor soon, to have it rechecked Blood tests are requested for you today.  We'll let you know about the results.  Let's check a special type of stomach x-ray.  you will receive a phone call, about a day and time for an appointment. On this type of insulin schedule, you should eat meals on a regular schedule.  If a meal is missed or significantly delayed, your blood sugar could go low.  check your blood sugar twice a day.  vary the time of day when you check, between before the 3 meals, and at bedtime.  also check if you have symptoms of your blood sugar being too high or too low.  please keep a record of the readings and bring it to your next appointment here (or you can bring the meter itself).  You can write it on any piece of paper.  please call us sooner if your blood sugar goes below 70, or if you have a lot of readings over 200.    Please come back for a follow-up appointment in 3 months.

## 2018-10-20 DIAGNOSIS — Z6827 Body mass index (BMI) 27.0-27.9, adult: Secondary | ICD-10-CM | POA: Insufficient documentation

## 2018-10-20 DIAGNOSIS — N2581 Secondary hyperparathyroidism of renal origin: Secondary | ICD-10-CM | POA: Insufficient documentation

## 2018-10-20 DIAGNOSIS — E785 Hyperlipidemia, unspecified: Secondary | ICD-10-CM | POA: Insufficient documentation

## 2018-10-20 HISTORY — DX: Body mass index (BMI) 27.0-27.9, adult: Z68.27

## 2018-10-20 HISTORY — DX: Secondary hyperparathyroidism of renal origin: N25.81

## 2018-10-20 LAB — FRUCTOSAMINE: Fructosamine: 289 umol/L — ABNORMAL HIGH (ref 205–285)

## 2018-10-22 DIAGNOSIS — R52 Pain, unspecified: Secondary | ICD-10-CM

## 2018-10-22 DIAGNOSIS — R197 Diarrhea, unspecified: Secondary | ICD-10-CM | POA: Insufficient documentation

## 2018-10-22 DIAGNOSIS — L299 Pruritus, unspecified: Secondary | ICD-10-CM | POA: Insufficient documentation

## 2018-10-22 HISTORY — DX: Pain, unspecified: R52

## 2018-10-22 HISTORY — DX: Pruritus, unspecified: L29.9

## 2018-10-22 HISTORY — DX: Diarrhea, unspecified: R19.7

## 2018-11-03 DIAGNOSIS — D509 Iron deficiency anemia, unspecified: Secondary | ICD-10-CM

## 2018-11-03 HISTORY — DX: Iron deficiency anemia, unspecified: D50.9

## 2018-11-11 ENCOUNTER — Other Ambulatory Visit: Payer: Self-pay | Admitting: Internal Medicine

## 2018-11-14 DIAGNOSIS — E441 Mild protein-calorie malnutrition: Secondary | ICD-10-CM

## 2018-11-14 HISTORY — DX: Mild protein-calorie malnutrition: E44.1

## 2018-11-18 DIAGNOSIS — D631 Anemia in chronic kidney disease: Secondary | ICD-10-CM | POA: Insufficient documentation

## 2018-11-18 DIAGNOSIS — N189 Chronic kidney disease, unspecified: Secondary | ICD-10-CM

## 2018-11-18 HISTORY — DX: Chronic kidney disease, unspecified: D63.1

## 2018-11-18 HISTORY — DX: Chronic kidney disease, unspecified: N18.9

## 2018-12-10 DIAGNOSIS — D689 Coagulation defect, unspecified: Secondary | ICD-10-CM | POA: Insufficient documentation

## 2019-01-06 ENCOUNTER — Other Ambulatory Visit: Payer: Self-pay

## 2019-01-06 ENCOUNTER — Encounter: Payer: Self-pay | Admitting: Cardiology

## 2019-01-06 ENCOUNTER — Ambulatory Visit (INDEPENDENT_AMBULATORY_CARE_PROVIDER_SITE_OTHER): Payer: Managed Care, Other (non HMO) | Admitting: Cardiology

## 2019-01-06 VITALS — BP 110/66 | HR 84 | Ht 69.0 in | Wt 167.1 lb

## 2019-01-06 DIAGNOSIS — E0822 Diabetes mellitus due to underlying condition with diabetic chronic kidney disease: Secondary | ICD-10-CM

## 2019-01-06 DIAGNOSIS — I5022 Chronic systolic (congestive) heart failure: Secondary | ICD-10-CM | POA: Diagnosis not present

## 2019-01-06 DIAGNOSIS — I10 Essential (primary) hypertension: Secondary | ICD-10-CM

## 2019-01-06 DIAGNOSIS — N185 Chronic kidney disease, stage 5: Secondary | ICD-10-CM

## 2019-01-06 DIAGNOSIS — I2510 Atherosclerotic heart disease of native coronary artery without angina pectoris: Secondary | ICD-10-CM

## 2019-01-06 DIAGNOSIS — Z794 Long term (current) use of insulin: Secondary | ICD-10-CM

## 2019-01-06 NOTE — Progress Notes (Signed)
Cardiology Office Note:    Date:  01/06/2019   ID:  Brandon Carroll, DOB [birth year], MRN [id number]  PCP:  Hoyt Koch, MD  Cardiologist:  Jenne Campus, MD    Referring MD: Hoyt Koch, *   Chief Complaint  Patient presents with  . Follow-up  And doing well on dialysis  History of Present Illness:    Brandon Carroll is a 58 y.o. male with history of congestive heart failure apparently diminished ejection fraction 40% with possibility of old myocardial infarction, echocardiogram done at the same time showed however normal ejection fraction without any segmental abnormalities.  He comes today to my office for follow-up overall doing well he is on dialysis 3 times a week he reports his blood pressure dropping the day of dialysis.  We talked in length about what to do with the situation I review all his medication I want him to maintain his beta-blocker as well as hydralazine and Imdur, however amlodipine, Norvasc can be discontinued.  He denies have any chest pain tightness squeezing pressure burning chest.  He is talking to [facility] about potential kidney transplant I told him that if he reached a stage that cardiac evaluation is needed to let us know.  He may require cardiac catheterization.  Past Medical History:  Diagnosis Date  . Acute systolic CHF (congestive heart failure), NYHA class 2 ([place]) 05/04/2015  . Acute-on-chronic kidney injury ([place]) 05/04/2015  . Diabetes mellitus   . Erectile dysfunction   . Hyperlipidemia   . Hypertension     Past Surgical History:  Procedure Laterality Date  . AV FISTULA PLACEMENT Left 05/26/2018   Procedure: ARTERIOVENOUS (AV) FISTULA CREATION LEFT ARM;  Surgeon: Marty Heck, MD;  Location: [facility];  Service: Vascular;  Laterality: Left;  . CARDIAC CATHETERIZATION N/A 06/15/2015   Procedure: Left Heart Cath and Coronary Angiography;  Surgeon: Peter M Martinique, MD;  Location: [facility];  Service:  Cardiovascular;  Laterality: N/A;  . CHOLECYSTECTOMY N/A 06/28/2017   Procedure: LAPAROSCOPIC CHOLECYSTECTOMY;  Surgeon: Ralene Ok, MD;  [facility];  Service: General;  Laterality: N/A;  . HERNIA REPAIR  7th grade   umbilical    Current Medications: Current Meds  Medication Sig  . amLODipine (NORVASC) 5 MG tablet Take 1 tablet (5 mg total) by mouth daily.  Marland Kitchen aspirin EC 81 MG tablet Take 81 mg by mouth daily.  . BD PEN NEEDLE NANO U/F 32G X 4 MM MISC USE ONCE DAILY AS DIRECTED  . carvedilol (COREG) 6.25 MG tablet Take 1 tablet (6.25 mg total) by mouth 2 (two) times daily. (Patient taking differently: Take 6.25 mg by mouth 2 (two) times daily with a meal. )  . Continuous Blood Gluc Receiver (FREESTYLE LIBRE 14 DAY READER) DEVI USE AS DIRECTED  . glucose blood (ONETOUCH VERIO) test strip 1 each by Other route 2 (two) times daily as needed for other. And lancets 2/day  . hydrALAZINE (APRESOLINE) 50 MG tablet Take 50 mg by mouth 3 (three) times daily.  . Insulin Glargine (LANTUS SOLOSTAR) 100 UNIT/ML Solostar Pen Inject 56 Units into the skin every morning. And pen needles 1/day  . isosorbide mononitrate (IMDUR) 60 MG 24 hr tablet TAKE 1 TABLET(60 MG) BY MOUTH DAILY  . rosuvastatin (CRESTOR) 20 MG tablet Take 2 tablets (40 mg total) by mouth daily. (Patient taking differently: Take 20 mg by mouth daily. )  . Spacer/Aero-Holding Chambers (AEROCHAMBER MV) inhaler Use as instructed     Allergies:  Lipitor [atorvastatin]   Social History   Socioeconomic History  . Marital status: Divorced    Spouse name: n/a  . Number of children: 1  . Years of education: 12+  . Highest education level: Not on file  Occupational History  . Occupation: Building services engineer: [employer]  Social Needs  . Financial resource strain: Not on file  . Food insecurity    Worry: Not on file    Inability: Not on file  . Transportation needs    Medical: Not on file    Non-medical: Not on file   Tobacco Use  . Smoking status: Never Smoker  . Smokeless tobacco: Never Used  Substance and Sexual Activity  . Alcohol use: No    Alcohol/week: 0.0 standard drinks  . Drug use: No  . Sexual activity: Not Currently    Comment: Erectile dysfunciotn  Lifestyle  . Physical activity    Days per week: Not on file    Minutes per session: Not on file  . Stress: Not on file  Relationships  . Social Herbalist on phone: Not on file    Gets together: Not on file    Attends religious service: Not on file    Active member of club or organization: Not on file    Attends meetings of clubs or organizations: Not on file    Relationship status: Not on file  Other Topics Concern  . Not on file  Social History Narrative   Divorced. Education: [employer].    Unable to exercise due to significant SOB and DOE.    Lives alone.   Son lives in [place].     Family History: The patient's family history includes Arthritis in his mother and sister; COPD in his mother and sister; Diabetes in his brother and mother; Hypertension in his father and mother. There is no history of Colon cancer. ROS:   Please see the history of present illness.    All 14 point review of systems negative except as described per history of present illness  EKGs/Labs/Other Studies Reviewed:      Recent Labs: 05/20/2018: Pro B Natriuretic peptide (BNP) 1,460.0; TSH 1.71 05/24/2018: B Natriuretic Peptide 1,449.4; Magnesium 2.0 07/18/2018: BUN 56; Creatinine 4.8; Hemoglobin 11.5; Platelets 299; Potassium 4.3; Sodium 139  Recent Lipid Panel    Component Value Date/Time   CHOL 197 09/24/2017 0823   CHOL 305 (H) 03/21/2017 1124   TRIG 245.0 (H) 09/24/2017 0823   HDL 45.60 09/24/2017 0823   HDL 60 03/21/2017 1124   CHOLHDL 4 09/24/2017 0823   VLDL 49.0 (H) 09/24/2017 0823   LDLCALC 212 (H) 03/21/2017 1124   LDLDIRECT 106.0 09/24/2017 0823    Physical Exam:    VS:  BP 110/66   Pulse 84   Ht 5\' 9"  (1.753 m)   Wt  167 lb 1.9 oz (75.8 kg)   SpO2 99%   BMI 24.68 kg/m     Wt Readings from Last 3 Encounters:  01/06/19 167 lb 1.9 oz (75.8 kg)  10/16/18 183 lb 12.8 oz (83.4 kg)  07/17/18 178 lb 12.8 oz (81.1 kg)     GEN:  Well nourished, well developed in no acute distress HEENT: Normal NECK: No JVD; No carotid bruits LYMPHATICS: No lymphadenopathy CARDIAC: RRR, no murmurs, no rubs, no gallops RESPIRATORY:  Clear to auscultation without rales, wheezing or rhonchi  ABDOMEN: Soft, non-tender, non-distended MUSCULOSKELETAL:  No edema; No deformity  SKIN: Warm and dry  LOWER EXTREMITIES: no swelling NEUROLOGIC:  Alert and oriented x 3 PSYCHIATRIC:  Normal affect   ASSESSMENT:    1. Essential hypertension   2. Coronary artery disease involving native coronary artery of native heart without angina pectoris   3. Chronic systolic CHF (congestive heart failure) ([place])   4. 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 Essential hypertension blood pressure well controlled actually now with facing opposite problem blood pressure being low.  I will discontinue his amlodipine. 2. Questionable coronary artery disease with stress test showing old MI however echocardiogram did not confirm that.  Likely he is asymptomatic.  He is on appropriate medication which I will continue. 3. Chronic systolic congestive heart failure again last echocardiogram showed preserved ejection fraction continue beta-blockade as well as hydralazine and Imdur. 4. Diabetes followed by internal medicine team   Medication Adjustments/Labs and Tests Ordered: Current medicines are reviewed at length with the patient today.  Concerns regarding medicines are outlined above.  No orders of the defined types were placed in this encounter.  Medication changes: No orders of the defined  types were placed in this encounter.   Signed, Park Liter, MD, [facility] 01/06/2019 9:54 AM    [place]

## 2019-01-06 NOTE — Patient Instructions (Signed)
Medication Instructions:  Your physician has recommended you make the following change in your medication:   STOP Amlodipine   If you need a refill on your cardiac medications before your next appointment, please call your pharmacy.   Lab work: None.  If you have labs (blood work) drawn today and your tests are completely normal, you will receive your results only by: Marland Kitchen MyChart Message (if you have MyChart) OR . A paper copy in the mail If you have any lab test that is abnormal or we need to change your treatment, we will call you to review the results.  Testing/Procedures: None.   Follow-Up: At [facility], you and your health needs are our priority.  As part of our continuing mission to provide you with exceptional heart care, we have created designated Provider Care Teams.  These Care Teams include your primary Cardiologist (physician) and Advanced Practice Providers (APPs -  Physician Assistants and Nurse Practitioners) who all work together to provide you with the care you need, when you need it. You will need a follow up appointment in 3 months.  Please call our office 2 months in advance to schedule this appointment.  You may see No primary care provider on file. or another member of our [employer] Provider Team in [place]: Shirlee More, MD . Jyl Heinz, MD  Any Other Special Instructions Will Be Listed Below (If Applicable).

## 2019-01-06 NOTE — Addendum Note (Signed)
Addended by: Ashok Norris on: 01/06/2019 10:05 AM   Modules accepted: Orders

## 2019-01-07 ENCOUNTER — Other Ambulatory Visit: Payer: Self-pay | Admitting: Cardiology

## 2019-01-22 ENCOUNTER — Telehealth: Payer: Self-pay | Admitting: Endocrinology

## 2019-01-22 NOTE — Telephone Encounter (Signed)
Faxing  Last office note and referral order to [facility]

## 2019-01-22 NOTE — Telephone Encounter (Signed)
Brandon Carroll is returning Brandon Carroll's call.  Ph # [phone] ext. [phone]

## 2019-01-26 ENCOUNTER — Other Ambulatory Visit: Payer: Self-pay

## 2019-01-26 ENCOUNTER — Ambulatory Visit (INDEPENDENT_AMBULATORY_CARE_PROVIDER_SITE_OTHER): Payer: Managed Care, Other (non HMO) | Admitting: Endocrinology

## 2019-01-26 ENCOUNTER — Encounter: Payer: Self-pay | Admitting: Endocrinology

## 2019-01-26 ENCOUNTER — Ambulatory Visit: Payer: Managed Care, Other (non HMO) | Admitting: Endocrinology

## 2019-01-26 VITALS — BP 168/82 | HR 86 | Ht 69.0 in | Wt 173.4 lb

## 2019-01-26 DIAGNOSIS — E8770 Fluid overload, unspecified: Secondary | ICD-10-CM

## 2019-01-26 DIAGNOSIS — Z9119 Patient's noncompliance with other medical treatment and regimen: Secondary | ICD-10-CM | POA: Diagnosis not present

## 2019-01-26 DIAGNOSIS — E1122 Type 2 diabetes mellitus with diabetic chronic kidney disease: Secondary | ICD-10-CM

## 2019-01-26 DIAGNOSIS — E0822 Diabetes mellitus due to underlying condition with diabetic chronic kidney disease: Secondary | ICD-10-CM

## 2019-01-26 DIAGNOSIS — N185 Chronic kidney disease, stage 5: Secondary | ICD-10-CM

## 2019-01-26 DIAGNOSIS — N189 Chronic kidney disease, unspecified: Secondary | ICD-10-CM | POA: Diagnosis not present

## 2019-01-26 DIAGNOSIS — Z794 Long term (current) use of insulin: Secondary | ICD-10-CM | POA: Diagnosis not present

## 2019-01-26 HISTORY — DX: Fluid overload, unspecified: E87.70

## 2019-01-26 LAB — POCT GLYCOSYLATED HEMOGLOBIN (HGB A1C): Hemoglobin A1C: 9.7 % — AB (ref 4.0–5.6)

## 2019-01-26 NOTE — Patient Instructions (Addendum)
Your blood pressure is high today.  Please have it rechecked at dialysis.   It is important to never miss the insulin.   On this type of insulin schedule, you should eat meals on a regular schedule.  If a meal is missed or significantly delayed, your blood sugar could go low.    Please also make a follow-up appointment with Dr [name].   check your blood sugar twice a day.  vary the time of day when you check, between before the 3 meals, and at bedtime.  also check if you have symptoms of your blood sugar being too high or too low.  please keep a record of the readings and bring it to your next appointment here (or you can bring the meter itself).  You can write it on any piece of paper.  please call us sooner if your blood sugar goes below 70, or if you have a lot of readings over 200.    Please come back for a follow-up appointment in 2 months.

## 2019-01-26 NOTE — Progress Notes (Signed)
Subjective:    Patient ID: Brandon Carroll, male    DOB: [birth year], 58 y.o.   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   Dx'ed: 99991111 Complications: renal failure (no HD yet).   Therapy: insulin since 2014.   DKA: never.  Severe hypoglycemia: once (early 2020) Pancreatitis: never.  Other: He works 1st shift, as a Forensic scientist; he is on QD insulin, due to h/o noncompliance; fructosamine converts to slightly higher a1c than cbg's and A1c itself.   Interval history: no cbg record, but states cbg's are in the 200's.  pt states he feels well in general.   Pt says he often misss the insulin, as he does not feel in general.   Past Medical History:  Diagnosis Date  . Acute systolic CHF (congestive heart failure), NYHA class 2 ([place]) 05/04/2015  . Acute-on-chronic kidney injury ([place]) 05/04/2015  . Diabetes mellitus   . Erectile dysfunction   . Hyperlipidemia   . Hypertension     Past Surgical History:  Procedure Laterality Date  . AV FISTULA PLACEMENT Left 05/26/2018   Procedure: ARTERIOVENOUS (AV) FISTULA CREATION LEFT ARM;  Surgeon: Marty Heck, MD;  Location: [facility];  Service: Vascular;  Laterality: Left;  . CARDIAC CATHETERIZATION N/A 06/15/2015   Procedure: Left Heart Cath and Coronary Angiography;  Surgeon: Peter M Martinique, MD;  Location: [facility];  Service: Cardiovascular;  Laterality: N/A;  . CHOLECYSTECTOMY N/A 06/28/2017   Procedure: LAPAROSCOPIC CHOLECYSTECTOMY;  Surgeon: Ralene Ok, MD;  [facility];  Service: General;  Laterality: N/A;  . HERNIA REPAIR  7th grade   umbilical    Social History   Socioeconomic History  . Marital status: Divorced    Spouse name: n/a  . Number of children: 1  . Years of education: 12+  . Highest education level: Not on file  Occupational History  . Occupation: Building services engineer: [employer]  Social Needs  .  Financial resource strain: Not on file  . Food insecurity    Worry: Not on file    Inability: Not on file  . Transportation needs    Medical: Not on file    Non-medical: Not on file  Tobacco Use  . Smoking status: Never Smoker  . Smokeless tobacco: Never Used  Substance and Sexual Activity  . Alcohol use: No    Alcohol/week: 0.0 standard drinks  . Drug use: No  . Sexual activity: Not Currently    Comment: Erectile dysfunciotn  Lifestyle  . Physical activity    Days per week: Not on file    Minutes per session: Not on file  . Stress: Not on file  Relationships  . Social Herbalist on phone: Not on file    Gets together: Not on file    Attends religious service: Not on file    Active member of club or organization: Not on file    Attends meetings of clubs or organizations: Not on file    Relationship status: Not on file  . Intimate partner violence    Fear of current or ex partner: Not on file    Emotionally abused: Not on file    Physically abused: Not on file    Forced sexual activity: Not on file  Other Topics Concern  . Not on file  Social History Narrative   Divorced. Education: [employer].  Unable to exercise due to significant SOB and DOE.    Lives alone.   Son lives in [place].    Current Outpatient Medications on File Prior to Visit  Medication Sig Dispense Refill  . aspirin EC 81 MG tablet Take 81 mg by mouth daily.    . BD PEN NEEDLE NANO U/F 32G X 4 MM MISC USE ONCE DAILY AS DIRECTED 100 each PRN  . carvedilol (COREG) 6.25 MG tablet TAKE 1 TABLET(6.25 MG) BY MOUTH TWICE DAILY 180 tablet 1  . Continuous Blood Gluc Receiver (FREESTYLE LIBRE 14 DAY READER) DEVI USE AS DIRECTED 6 Device 3  . glucose blood (ONETOUCH VERIO) test strip 1 each by Other route 2 (two) times daily as needed for other. And lancets 2/day 100 each 12  . hydrALAZINE (APRESOLINE) 50 MG tablet Take 50 mg by mouth 3 (three) times daily.    . Insulin Glargine (LANTUS SOLOSTAR) 100  UNIT/ML Solostar Pen Inject 56 Units into the skin every morning. And pen needles 1/day 10 pen PRN  . isosorbide mononitrate (IMDUR) 60 MG 24 hr tablet TAKE 1 TABLET(60 MG) BY MOUTH DAILY 90 tablet 1  . rosuvastatin (CRESTOR) 20 MG tablet Take 2 tablets (40 mg total) by mouth daily. (Patient taking differently: Take 20 mg by mouth daily. ) 90 tablet 3  . Spacer/Aero-Holding Chambers (AEROCHAMBER MV) inhaler Use as instructed 1 each 0   No current facility-administered medications on file prior to visit.     Allergies  Allergen Reactions  . Lipitor [Atorvastatin] Hives and Itching    Family History  Problem Relation Age of Onset  . Arthritis Mother   . COPD Mother   . Diabetes Mother        was thin, took insulin  . Hypertension Mother   . Hypertension Father   . Arthritis Sister        rheumatoid  . COPD Sister   . Diabetes Brother   . Colon cancer Neg Hx     BP (!) 168/82 (BP Location: Right Arm, Patient Position: Sitting, Cuff Size: Normal)   Pulse 86   Ht 5\' 9"  (1.753 m)   Wt 173 lb 6.4 oz (78.7 kg)   SpO2 96%   BMI 25.61 kg/m    Review of Systems He denies hypoglycemia    Objective:   Physical Exam VITAL SIGNS:  See vs page GENERAL: no distress Pulses: dorsalis pedis intact bilat.   MSK: no deformity of the feet CV: no leg edema Skin:  no ulcer on the feet.  normal color and temp on the feet. Neuro: sensation is intact to touch on the feet  A1c=9.7%     Assessment & Plan:  Insulin-requiring type 2 DM: worse.   Renal failure: this a1c slightly underestimates avg glucose.  Noncompliance with cbg recording and insulin.  We discussed.   HTN: is noted today.    Patient Instructions  Your blood pressure is high today.  Please have it rechecked at dialysis.   It is important to never miss the insulin.   On this type of insulin schedule, you should eat meals on a regular schedule.  If a meal is missed or significantly delayed, your blood sugar could go low.     Please also make a follow-up appointment with Dr [name].   check your blood sugar twice a day.  vary the time of day when you check, between before the 3 meals, and at bedtime.  also check if you have symptoms  of your blood sugar being too high or too low.  please keep a record of the readings and bring it to your next appointment here (or you can bring the meter itself).  You can write it on any piece of paper.  please call us sooner if your blood sugar goes below 70, or if you have a lot of readings over 200.    Please come back for a follow-up appointment in 2 months.

## 2019-03-12 DIAGNOSIS — E1121 Type 2 diabetes mellitus with diabetic nephropathy: Secondary | ICD-10-CM | POA: Insufficient documentation

## 2019-03-12 DIAGNOSIS — I770 Arteriovenous fistula, acquired: Secondary | ICD-10-CM

## 2019-03-12 HISTORY — DX: Arteriovenous fistula, acquired: I77.0

## 2019-03-17 ENCOUNTER — Ambulatory Visit: Payer: Managed Care, Other (non HMO) | Admitting: Cardiology

## 2019-03-19 ENCOUNTER — Ambulatory Visit (INDEPENDENT_AMBULATORY_CARE_PROVIDER_SITE_OTHER): Payer: Medicare Other

## 2019-03-19 ENCOUNTER — Ambulatory Visit (INDEPENDENT_AMBULATORY_CARE_PROVIDER_SITE_OTHER): Payer: Managed Care, Other (non HMO) | Admitting: Pulmonary Disease

## 2019-03-19 ENCOUNTER — Encounter: Payer: Self-pay | Admitting: Pulmonary Disease

## 2019-03-19 ENCOUNTER — Other Ambulatory Visit: Payer: Self-pay

## 2019-03-19 DIAGNOSIS — D869 Sarcoidosis, unspecified: Secondary | ICD-10-CM

## 2019-03-19 DIAGNOSIS — N1832 Chronic kidney disease, stage 3b: Secondary | ICD-10-CM | POA: Diagnosis not present

## 2019-03-19 DIAGNOSIS — Z794 Long term (current) use of insulin: Secondary | ICD-10-CM

## 2019-03-19 DIAGNOSIS — E0821 Diabetes mellitus due to underlying condition with diabetic nephropathy: Secondary | ICD-10-CM

## 2019-03-19 DIAGNOSIS — R9389 Abnormal findings on diagnostic imaging of other specified body structures: Secondary | ICD-10-CM | POA: Diagnosis not present

## 2019-03-19 DIAGNOSIS — I2510 Atherosclerotic heart disease of native coronary artery without angina pectoris: Secondary | ICD-10-CM

## 2019-03-19 MED ORDER — PREDNISONE 5 MG PO TABS: ORAL_TABLET | ORAL | 0 refills | Status: DC

## 2019-03-19 NOTE — Assessment & Plan Note (Signed)
If clinical worsening, will follow up with CT chest with contrast

## 2019-03-19 NOTE — Assessment & Plan Note (Signed)
Expect hyperglycemia to be slight worse with steroids, he will monitor and increase Lantus if required

## 2019-03-19 NOTE — Progress Notes (Signed)
   Subjective:    Patient ID: Brandon Carroll, male    DOB: February 09, 1961, 58 y.o.   MRN: [id number]  HPI  58 year old never smoker for follow-up of sarcoid w/ mediastinal adenopathy & abn CT Chest, never been biopsied, elev ACE level , not on Pred therapy due to poorly controlled DM  He is a former patient of Dr. Lenna Gilford who is now retired PMH-insulin requiring diabetes, ESRD on HD M/W/F, nonischemic cardiomyopathy and hypertension  I have reviewed his chart and Dr. Jeannine Kitten prior notes  Since his last visit with [place] in 2019 he has started on dialysis in June/2020  He reports a longstanding dry cough for 2 to 3 years, only Delsym seems to relieve this.  He has never taken prednisone. He reports increasing fatigue especially on dialysis days and some dyspnea even though he is not retaining fluid. He denies wheezing or reflux or sinus congestion. He reports a rash over his back for which he saw a dermatologist and was given Selsun shampoo in the pill -seems like presumptive diagnosis was tinea versicolor  Sugars are better controlled on 56 units of Lantus once daily, on dialysis he does have drop in his sugars Flu shot is up-to-date  Significant tests/ events reviewed  12/2016 CT chest without contrast No substantial interval change compared to 2017 the mediastinal and bilateral hilar lymphadenopathy is similar to prior. Scattered parenchymal and mainly perifissural nodularity also with similar appearance. Dominant 6 mm right upper lobe nodule is unchanged.  05/2018 echo > grade 2 diastolic dysfunction 99991111 VQ scan negative  Spirometry 05/2015 ratio 78, FEV1 79%, FVC 81%   Past Medical History:  Diagnosis Date  . Acute systolic CHF (congestive heart failure), NYHA class 2 ([place]) 05/04/2015  . Acute-on-chronic kidney injury ([place]) 05/04/2015  . Diabetes mellitus   . Erectile dysfunction   . Hyperlipidemia   . Hypertension      Review of Systems neg for any significant sore throat,  dysphagia, itching, sneezing, nasal congestion or excess/ purulent secretions, fever, chills, sweats, unintended wt loss, pleuritic or exertional cp, hempoptysis, orthopnea pnd or change in chronic leg swelling. Also denies presyncope, palpitations, heartburn, abdominal pain, nausea, vomiting, diarrhea or change in bowel or urinary habits, dysuria,hematuria, rash, arthralgias, visual complaints, headache, numbness weakness or ataxia.     Objective:   Physical Exam  Gen. Pleasant, well-nourished, in no distress, normal affect ENT - no pallor,icterus, no post nasal drip Neck: No JVD, no thyromegaly, no carotid bruits Lungs: no use of accessory muscles, no dullness to percussion, clear without rales or rhonchi  Cardiovascular: Rhythm regular, heart sounds  normal, no murmurs or gallops, no peripheral edema Abdomen: soft and non-tender, no hepatosplenomegaly, BS normal. Musculoskeletal: No deformities, no cyanosis or clubbing, left forearm thrill due to AV fistula Neuro:  alert, non focal Skin -dark papular rash over back with flat patches over front of torso c/w tinea       Assessment & Plan:

## 2019-03-19 NOTE — Patient Instructions (Signed)
Chest x-ray today. Trial of low-dose prednisone - Prednisone 5 mg tabs  Take 2 tabs daily with food x 7ds, then 1 tab daily with food x 7ds then STOP  Call me back to report if cough, breathing or rash is better after taking prednisone  PFTs on next visit in 3 months

## 2019-03-19 NOTE — Assessment & Plan Note (Signed)
Chest x-ray today. Trial of low-dose prednisone - Prednisone 5 mg tabs  Take 2 tabs daily with food x 7ds, then 1 tab daily with food x 7ds then STOP  Call me back to report if cough, breathing or rash is better after taking prednisone  He also has a rash on his back which I would expect to improve with prednisone PFTs on next visit in 3 months

## 2019-03-20 ENCOUNTER — Other Ambulatory Visit: Payer: Self-pay | Admitting: Cardiology

## 2019-03-20 NOTE — Telephone Encounter (Signed)
Isosorbide refill sent to [facility] in [place] 

## 2019-03-31 ENCOUNTER — Encounter: Payer: Self-pay | Admitting: Endocrinology

## 2019-03-31 ENCOUNTER — Ambulatory Visit (INDEPENDENT_AMBULATORY_CARE_PROVIDER_SITE_OTHER): Payer: Managed Care, Other (non HMO) | Admitting: Endocrinology

## 2019-03-31 ENCOUNTER — Other Ambulatory Visit: Payer: Self-pay

## 2019-03-31 VITALS — BP 128/76 | HR 86 | Ht 69.0 in | Wt 172.6 lb

## 2019-03-31 DIAGNOSIS — Z794 Long term (current) use of insulin: Secondary | ICD-10-CM

## 2019-03-31 DIAGNOSIS — E0822 Diabetes mellitus due to underlying condition with diabetic chronic kidney disease: Secondary | ICD-10-CM

## 2019-03-31 DIAGNOSIS — N189 Chronic kidney disease, unspecified: Secondary | ICD-10-CM | POA: Diagnosis not present

## 2019-03-31 DIAGNOSIS — I2510 Atherosclerotic heart disease of native coronary artery without angina pectoris: Secondary | ICD-10-CM

## 2019-03-31 DIAGNOSIS — E1122 Type 2 diabetes mellitus with diabetic chronic kidney disease: Secondary | ICD-10-CM

## 2019-03-31 DIAGNOSIS — N185 Chronic kidney disease, stage 5: Secondary | ICD-10-CM

## 2019-03-31 LAB — POCT GLYCOSYLATED HEMOGLOBIN (HGB A1C): Hemoglobin A1C: 8.3 % — AB (ref 4.0–5.6)

## 2019-03-31 MED ORDER — LANTUS SOLOSTAR 100 UNIT/ML ~~LOC~~ SOPN: 50.0000 [IU] | PEN_INJECTOR | SUBCUTANEOUS | 2 refills | Status: DC

## 2019-03-31 MED ORDER — FREESTYLE LIBRE 14 DAY SENSOR MISC: 1.0000 | 2 refills | Status: DC

## 2019-03-31 MED ORDER — LANTUS SOLOSTAR 100 UNIT/ML ~~LOC~~ SOPN: 52.0000 [IU] | PEN_INJECTOR | SUBCUTANEOUS | 11 refills | Status: DC

## 2019-03-31 NOTE — Progress Notes (Signed)
Subjective:    Patient ID: Brandon Carroll, male    DOB: 22-May-1960, 58 y.o.   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   Dx'ed: 99991111 Complications: renal failure (no HD yet).   Therapy: insulin since 2014.   DKA: never.  Severe hypoglycemia: once (early 2020) Pancreatitis: never.  Other: He works 1st shift, as a Forensic scientist; he is on QD insulin, due to h/o noncompliance; fructosamine converts to slightly higher A1c than cbg's and A1c itself.   Interval history: no cbg record, but states cbg's vary from 87-200.  It is in general higher as the day goes on.  pt states he feels well in general.   Pt says he misses the insulin,several times per month. He sometimes takes Lantus later in the day Past Medical History:  Diagnosis Date  . Acute systolic CHF (congestive heart failure), NYHA class 2 ([place]) 05/04/2015  . Acute-on-chronic kidney injury ([place]) 05/04/2015  . Diabetes mellitus   . Erectile dysfunction   . Hyperlipidemia   . Hypertension     Past Surgical History:  Procedure Laterality Date  . AV FISTULA PLACEMENT Left 05/26/2018   Procedure: ARTERIOVENOUS (AV) FISTULA CREATION LEFT ARM;  Surgeon: Marty Heck, MD;  Location: [place];  Service: Vascular;  Laterality: Left;  . CARDIAC CATHETERIZATION N/A 06/15/2015   Procedure: Left Heart Cath and Coronary Angiography;  Surgeon: Peter M Martinique, MD;  Location: [place] [facility];  Service: Cardiovascular;  Laterality: N/A;  . CHOLECYSTECTOMY N/A 06/28/2017   Procedure: LAPAROSCOPIC CHOLECYSTECTOMY;  Surgeon: Ralene Ok, MD;  Location: [facility];  Service: General;  Laterality: N/A;  . HERNIA REPAIR  7th grade   umbilical    Social History   Socioeconomic History  . Marital status: Divorced    Spouse name: n/a  . Number of children: 1  . Years of education: 12+  . Highest education level: Not on file  Occupational History  .  Occupation: Building services engineer: [employer]  Social Needs  . Financial resource strain: Not on file  . Food insecurity    Worry: Not on file    Inability: Not on file  . Transportation needs    Medical: Not on file    Non-medical: Not on file  Tobacco Use  . Smoking status: Never Smoker  . Smokeless tobacco: Never Used  Substance and Sexual Activity  . Alcohol use: No    Alcohol/week: 0.0 standard drinks  . Drug use: No  . Sexual activity: Not Currently    Comment: Erectile dysfunciotn  Lifestyle  . Physical activity    Days per week: Not on file    Minutes per session: Not on file  . Stress: Not on file  Relationships  . Social Herbalist on phone: Not on file    Gets together: Not on file    Attends religious service: Not on file    Active member of club or organization: Not on file    Attends meetings of clubs or organizations: Not on file    Relationship status: Not on file  . Intimate partner violence    Fear of current or ex partner: Not on file    Emotionally abused: Not on file    Physically abused: Not on file    Forced sexual activity: Not on file  Other Topics Concern  . Not on file  Social History Narrative   Divorced. Education: [employer].    Unable to exercise due to significant SOB and DOE.    Lives alone.   Son lives in [place].    Current Outpatient Medications on File Prior to Visit  Medication Sig Dispense Refill  . aspirin EC 81 MG tablet Take 81 mg by mouth daily.    . BD PEN NEEDLE NANO U/F 32G X 4 MM MISC USE ONCE DAILY AS DIRECTED 100 each PRN  . carvedilol (COREG) 6.25 MG tablet TAKE 1 TABLET(6.25 MG) BY MOUTH TWICE DAILY 180 tablet 1  . glucose blood (ONETOUCH VERIO) test strip 1 each by Other route 2 (two) times daily as needed for other. And lancets 2/day 100 each 12  . hydrALAZINE (APRESOLINE) 50 MG tablet Take 50 mg by mouth 3 (three) times daily.    . isosorbide mononitrate (IMDUR) 60 MG 24 hr tablet TAKE 1 TABLET(60  MG) BY MOUTH DAILY 90 tablet 0  . predniSONE (DELTASONE) 5 MG tablet Take 2 tablets x 7 days, then 1 tablet daily x 7 days, then stop. 21 tablet 0  . rosuvastatin (CRESTOR) 20 MG tablet Take 2 tablets (40 mg total) by mouth daily. (Patient taking differently: Take 20 mg by mouth daily. ) 90 tablet 3  . Spacer/Aero-Holding Chambers (AEROCHAMBER MV) inhaler Use as instructed 1 each 0  . Continuous Blood Gluc Receiver (FREESTYLE LIBRE 14 DAY READER) DEVI USE AS DIRECTED (Patient not taking: Reported on 03/31/2019) 6 Device 3   No current facility-administered medications on file prior to visit.     Allergies  Allergen Reactions  . Lipitor [Atorvastatin] Hives and Itching    Family History  Problem Relation Age of Onset  . Arthritis Mother   . COPD Mother   . Diabetes Mother        was thin, took insulin  . Hypertension Mother   . Hypertension Father   . Arthritis Sister        rheumatoid  . COPD Sister   . Diabetes Brother   . Colon cancer Neg Hx     BP 128/76 (BP Location: Right Arm, Patient Position: Sitting, Cuff Size: Normal)   Pulse 86   Ht 5\' 9"  (1.753 m)   Wt 172 lb 9.6 oz (78.3 kg)   SpO2 98%   BMI 25.49 kg/m    Review of Systems He denies hypoglycemia.      Objective:   Physical Exam VITAL SIGNS:  See vs page GENERAL: no distress Pulses: dorsalis pedis intact bilat.   MSK: no deformity of the feet CV: no leg edema Skin:  no ulcer on the feet.  normal color and temp on the feet. Neuro: sensation is intact to touch on the feet  Lab Results  Component Value Date   HGBA1C 8.3 (A) 03/31/2019       Assessment & Plan:  Insulin-requiring type 2 DM, with renal failure: he needs increased rx.    Patient Instructions  It is important to never miss the insulin.  Please increase it to 52 units each morning.  On this type of insulin schedule, you should eat meals on a regular schedule.  If a meal is missed or significantly delayed, your blood sugar could go low.     Please also make a follow-up appointment with Dr [name].   check your blood sugar twice a day.  vary the time of day when you check, between before the 3 meals, and at bedtime.  also check  if you have symptoms of your blood sugar being too high or too low.  please keep a record of the readings and bring it to your next appointment here (or you can bring the meter itself).  You can write it on any piece of paper.  please call us sooner if your blood sugar goes below 70, or if you have a lot of readings over 200.    Please come back for a follow-up appointment in 2 months.

## 2019-03-31 NOTE — Patient Instructions (Addendum)
It is important to never miss the insulin.  Please increase it to 52 units each morning.  On this type of insulin schedule, you should eat meals on a regular schedule.  If a meal is missed or significantly delayed, your blood sugar could go low.    Please also make a follow-up appointment with Dr [name].   check your blood sugar twice a day.  vary the time of day when you check, between before the 3 meals, and at bedtime.  also check if you have symptoms of your blood sugar being too high or too low.  please keep a record of the readings and bring it to your next appointment here (or you can bring the meter itself).  You can write it on any piece of paper.  please call us sooner if your blood sugar goes below 70, or if you have a lot of readings over 200.    Please come back for a follow-up appointment in 2 months.

## 2019-04-14 ENCOUNTER — Other Ambulatory Visit: Payer: Self-pay

## 2019-04-14 ENCOUNTER — Ambulatory Visit (INDEPENDENT_AMBULATORY_CARE_PROVIDER_SITE_OTHER): Payer: Managed Care, Other (non HMO) | Admitting: Cardiology

## 2019-04-14 ENCOUNTER — Encounter: Payer: Self-pay | Admitting: Cardiology

## 2019-04-14 VITALS — BP 118/64 | HR 82 | Ht 69.0 in | Wt 175.4 lb

## 2019-04-14 DIAGNOSIS — E0822 Diabetes mellitus due to underlying condition with diabetic chronic kidney disease: Secondary | ICD-10-CM

## 2019-04-14 DIAGNOSIS — E0821 Diabetes mellitus due to underlying condition with diabetic nephropathy: Secondary | ICD-10-CM | POA: Diagnosis not present

## 2019-04-14 DIAGNOSIS — I2510 Atherosclerotic heart disease of native coronary artery without angina pectoris: Secondary | ICD-10-CM

## 2019-04-14 DIAGNOSIS — D869 Sarcoidosis, unspecified: Secondary | ICD-10-CM

## 2019-04-14 DIAGNOSIS — Z794 Long term (current) use of insulin: Secondary | ICD-10-CM

## 2019-04-14 DIAGNOSIS — I5022 Chronic systolic (congestive) heart failure: Secondary | ICD-10-CM

## 2019-04-14 DIAGNOSIS — N1832 Chronic kidney disease, stage 3b: Secondary | ICD-10-CM

## 2019-04-14 NOTE — Progress Notes (Signed)
Cardiology Office Note:    Date:  04/14/2019   ID:  Brandon Carroll, DOB 12-09-1960, MRN [id number]  PCP:  Hoyt Koch, MD  Cardiologist:  Jenne Campus, MD    Referring MD: Hoyt Koch, *   Chief Complaint  Patient presents with   Follow-up  Doing well potential poor during dialysis  History of Present Illness:    Brandon Carroll is a 58 y.o. male with complex past medical history.  He does have diabetes, sarcoidosis with pulmonary involvement, chronic kidney failure on dialysis 3 times a week Monday Wednesday Friday.  He was referred to kidney transplant team for consideration of transplantation.  He is being seen already by pulmonary.  He was referred to [place] to be evaluated before potential transplant story is quite complicated.  He did have a stress test done few months ago which showed some possibility of old myocardial infarction.  After that echocardiogram has been done which did not confirm any myocardial infarction.  He denies having any chest pain tightness squeezing pressure burning chest but does have some fatigue tiredness especially after dialysis.  Recently he was in [place] to be evaluated before kidney transplant apparently EKG was done which showed some acute changes he was offered to go to the emergency room which she declined.  It looks like echocardiogram has been done after that I do not have access to this echocardiogram I do see that it was done patient tells me that apparently everything was fine on echocardiogram but in the note from physician who seen him for transplant there was some notation that some cardiologist reviewed EKG and said that what ever is seen on the EKG is what he expected based on abnormal echocardiogram that he had done.  Regardless I need to get report of this echocardiogram.  It looks like he will require cardiac catheterization before kidney transplantation.  I will wait for results of echocardiogram and then decide about  proceeding with cardiac catheterization.  Procedure was explained to him including all risk benefits as well as alternatives.  He is willing to proceed.  We will talk to our nephrologist colleagues as well to make arrangements for this if we need to do it.  Another issue that he is complaining about is profound weakness fatigue of dialysis time.  I asked him to hold his higher morning dose of hydralazine before he goes to dialysis to help with the blood pressure which is typically dropping during and after dialysis.  Past Medical History:  Diagnosis Date   Acute systolic CHF (congestive heart failure), NYHA class 2 ([place]) 05/04/2015   Acute-on-chronic kidney injury ([place]) 05/04/2015   Diabetes mellitus    Erectile dysfunction    Hyperlipidemia    Hypertension     Past Surgical History:  Procedure Laterality Date   AV FISTULA PLACEMENT Left 05/26/2018   Procedure: ARTERIOVENOUS (AV) FISTULA CREATION LEFT ARM;  Surgeon: Marty Heck, MD;  Location: [place];  Service: Vascular;  Laterality: Left;   CARDIAC CATHETERIZATION N/A 06/15/2015   Procedure: Left Heart Cath and Coronary Angiography;  Surgeon: Peter M Martinique, MD;  Location: [place] [facility];  Service: Cardiovascular;  Laterality: N/A;   CHOLECYSTECTOMY N/A 06/28/2017   Procedure: LAPAROSCOPIC CHOLECYSTECTOMY;  Surgeon: Ralene Ok, MD;  [facility];  Service: General;  Laterality: N/A;   HERNIA REPAIR  7th grade   umbilical    Current Medications: Current Meds  Medication Sig   aspirin EC 81 MG tablet Take 81  mg by mouth daily.   BD PEN NEEDLE NANO U/F 32G X 4 MM MISC USE ONCE DAILY AS DIRECTED   carvedilol (COREG) 6.25 MG tablet TAKE 1 TABLET(6.25 MG) BY MOUTH TWICE DAILY   Continuous Blood Gluc Receiver (FREESTYLE LIBRE 14 DAY READER) DEVI USE AS DIRECTED   Continuous Blood Gluc Sensor (FREESTYLE LIBRE 14 DAY SENSOR) MISC 1 each by Does not apply route every 14 (fourteen) days. For use with continuous  glucose monitoring system. Change every 14 days   glucose blood (ONETOUCH VERIO) test strip 1 each by Other route 2 (two) times daily as needed for other. And lancets 2/day   hydrALAZINE (APRESOLINE) 50 MG tablet Take 50 mg by mouth 3 (three) times daily.   Insulin Glargine (LANTUS SOLOSTAR) 100 UNIT/ML Solostar Pen Inject 52 Units into the skin every morning. And pen needles 1/day   isosorbide mononitrate (IMDUR) 60 MG 24 hr tablet TAKE 1 TABLET(60 MG) BY MOUTH DAILY   predniSONE (DELTASONE) 5 MG tablet Take 2 tablets x 7 days, then 1 tablet daily x 7 days, then stop.   rosuvastatin (CRESTOR) 20 MG tablet Take 2 tablets (40 mg total) by mouth daily. (Patient taking differently: Take 20 mg by mouth daily. )     Allergies:   Lipitor [atorvastatin]   Social History   Socioeconomic History   Marital status: Divorced    Spouse name: n/a   Number of children: 1   Years of education: 12+   Highest education level: Not on file  Occupational History   Occupation: Building services engineer: [employer]  Scientist, product/process development strain: Not on file   Food insecurity    Worry: Not on file    Inability: Not on file   Transportation needs    Medical: Not on file    Non-medical: Not on file  Tobacco Use   Smoking status: Never Smoker   Smokeless tobacco: Never Used  Substance and Sexual Activity   Alcohol use: No    Alcohol/week: 0.0 standard drinks   Drug use: No   Sexual activity: Not Currently    Comment: Erectile dysfunciotn  Lifestyle   Physical activity    Days per week: Not on file    Minutes per session: Not on file   Stress: Not on file  Relationships   Social connections    Talks on phone: Not on file    Gets together: Not on file    Attends religious service: Not on file    Active member of club or organization: Not on file    Attends meetings of clubs or organizations: Not on file    Relationship status: Not on file  Other Topics  Concern   Not on file  Social History Narrative   Divorced. Education: [employer].    Unable to exercise due to significant SOB and DOE.    Lives alone.   Son lives in [place].     Family History: The patient's family history includes Arthritis in his mother and sister; COPD in his mother and sister; Diabetes in his brother and mother; Hypertension in his father and mother. There is no history of Colon cancer. ROS:   Please see the history of present illness.    All 14 point review of systems negative except as described per history of present illness  EKGs/Labs/Other Studies Reviewed:    G showed normal sinus rhythm normal P interval there is diffuse asymmetrical T inversion in inferior  leads with some ST segment depression as well as minimal ST segment elevation in the lateral leads.  This is exactly the same changes that were noted on the EKG done at [facility] on 03/12/2019.  Patient is asymptomatic, he does not have any chest pain.  Recent Labs: 05/20/2018: Pro B Natriuretic peptide (BNP) 1,460.0; TSH 1.71 05/24/2018: B Natriuretic Peptide 1,449.4; Magnesium 2.0 07/18/2018: BUN 56; Creatinine 4.8; Hemoglobin 11.5; Platelets 299; Potassium 4.3; Sodium 139  Recent Lipid Panel    Component Value Date/Time   CHOL 197 09/24/2017 0823   CHOL 305 (H) 03/21/2017 1124   TRIG 245.0 (H) 09/24/2017 0823   HDL 45.60 09/24/2017 0823   HDL 60 03/21/2017 1124   CHOLHDL 4 09/24/2017 0823   VLDL 49.0 (H) 09/24/2017 0823   LDLCALC 212 (H) 03/21/2017 1124   LDLDIRECT 106.0 09/24/2017 0823    Physical Exam:    VS:  BP 118/64    Pulse 82    Ht 5\' 9"  (1.753 m)    Wt 175 lb 6.4 oz (79.6 kg)    SpO2 98%    BMI 25.90 kg/m     Wt Readings from Last 3 Encounters:  04/14/19 175 lb 6.4 oz (79.6 kg)  03/31/19 172 lb 9.6 oz (78.3 kg)  03/19/19 173 lb (78.5 kg)     GEN:  Well nourished, well developed in no acute distress HEENT: Normal NECK: No JVD; No carotid bruits LYMPHATICS: No  lymphadenopathy CARDIAC: RRR, no murmurs, no rubs, no gallops RESPIRATORY:  Clear to auscultation without rales, wheezing or rhonchi  ABDOMEN: Soft, non-tender, non-distended MUSCULOSKELETAL:  No edema; No deformity  SKIN: Warm and dry LOWER EXTREMITIES: no swelling NEUROLOGIC:  Alert and oriented x 3 PSYCHIATRIC:  Normal affect   ASSESSMENT:    1. Coronary artery disease involving native coronary artery of native heart without angina pectoris   2. Chronic systolic CHF (congestive heart failure) (HCC)   3. Sarcoidosis   4. 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 Coronary disease.  It looks like he will require cardiac catheterization.  Awaiting results of the echocardiogram from [facility] to decide about it.  I explained procedure already to him.  He is willing to proceed. 2. Chronic systolic congestive heart failure will get echocardiogram to look up ejection fraction.  Again it was done just last month in the office. 3. 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 4. Diabetes followed by internal medicine team. 5. Chronic kidney failure on dialysis.  He does have some drop in the blood pressure during dialysis and shortly after that.  Therefore, asked him to stop his hydralazine the day of dialysis and then after dialysis take it if his blood pressures more than 120/70.   Medication Adjustments/Labs and Tests Ordered: Current medicines are reviewed at length with the patient today.  Concerns regarding medicines are outlined above.  No orders of the defined types were placed in this encounter.  Medication changes: No orders of the defined types were placed in this encounter.   Signed, Park Liter, MD, [facility] [address] 04/14/2019 9:54 AM    [place]

## 2019-04-14 NOTE — H&P (View-Only) (Signed)
Cardiology Office Note:    Date:  04/14/2019   ID:  Brandon Carroll, DOB [birth year], MRN [id number]  PCP:  Hoyt Koch, MD  Cardiologist:  Jenne Campus, MD    Referring MD: Hoyt Koch, *   Chief Complaint  Patient presents with  . Follow-up  Doing well potential poor during dialysis  History of Present Illness:    Brandon Carroll is a 58 y.o. male with complex past medical history.  He does have diabetes, sarcoidosis with pulmonary involvement, chronic kidney failure on dialysis 3 times a week Monday Wednesday Friday.  He was referred to kidney transplant team for consideration of transplantation.  He is being seen already by pulmonary.  He was referred to [place] to be evaluated before potential transplant story is quite complicated.  He did have a stress test done few months ago which showed some possibility of old myocardial infarction.  After that echocardiogram has been done which did not confirm any myocardial infarction.  He denies having any chest pain tightness squeezing pressure burning chest but does have some fatigue tiredness especially after dialysis.  Recently he was in [place] to be evaluated before kidney transplant apparently EKG was done which showed some acute changes he was offered to go to the emergency room which she declined.  It looks like echocardiogram has been done after that I do not have access to this echocardiogram I do see that it was done patient tells me that apparently everything was fine on echocardiogram but in the note from physician who seen him for transplant there was some notation that some cardiologist reviewed EKG and said that what ever is seen on the EKG is what he expected based on abnormal echocardiogram that he had done.  Regardless I need to get report of this echocardiogram.  It looks like he will require cardiac catheterization before kidney transplantation.  I will wait for results of echocardiogram and then decide about  proceeding with cardiac catheterization.  Procedure was explained to him including all risk benefits as well as alternatives.  He is willing to proceed.  We will talk to our nephrologist colleagues as well to make arrangements for this if we need to do it.  Another issue that he is complaining about is profound weakness fatigue of dialysis time.  I asked him to hold his higher morning dose of hydralazine before he goes to dialysis to help with the blood pressure which is typically dropping during and after dialysis.  Past Medical History:  Diagnosis Date  . Acute systolic CHF (congestive heart failure), NYHA class 2 ([place]) 05/04/2015  . Acute-on-chronic kidney injury ([place]) 05/04/2015  . Diabetes mellitus   . Erectile dysfunction   . Hyperlipidemia   . Hypertension     Past Surgical History:  Procedure Laterality Date  . AV FISTULA PLACEMENT Left 05/26/2018   Procedure: ARTERIOVENOUS (AV) FISTULA CREATION LEFT ARM;  Surgeon: Marty Heck, MD;  Location: [facility];  Service: Vascular;  Laterality: Left;  . CARDIAC CATHETERIZATION N/A 06/15/2015   Procedure: Left Heart Cath and Coronary Angiography;  Surgeon: Peter M Martinique, MD;  Location: [facility];  Service: Cardiovascular;  Laterality: N/A;  . CHOLECYSTECTOMY N/A 06/28/2017   Procedure: LAPAROSCOPIC CHOLECYSTECTOMY;  Surgeon: Ralene Ok, MD;  Location: [facility];  Service: General;  Laterality: N/A;  . HERNIA REPAIR  7th grade   umbilical    Current Medications: Current Meds  Medication Sig  . aspirin EC 81 MG tablet Take 81  mg by mouth daily.  . BD PEN NEEDLE NANO U/F 32G X 4 MM MISC USE ONCE DAILY AS DIRECTED  . carvedilol (COREG) 6.25 MG tablet TAKE 1 TABLET(6.25 MG) BY MOUTH TWICE DAILY  . Continuous Blood Gluc Receiver (FREESTYLE LIBRE 14 DAY READER) DEVI USE AS DIRECTED  . Continuous Blood Gluc Sensor (FREESTYLE LIBRE 14 DAY SENSOR) MISC 1 each by Does not apply route every 14 (fourteen) days. For use with continuous  glucose monitoring system. Change every 14 days  . glucose blood (ONETOUCH VERIO) test strip 1 each by Other route 2 (two) times daily as needed for other. And lancets 2/day  . hydrALAZINE (APRESOLINE) 50 MG tablet Take 50 mg by mouth 3 (three) times daily.  . Insulin Glargine (LANTUS SOLOSTAR) 100 UNIT/ML Solostar Pen Inject 52 Units into the skin every morning. And pen needles 1/day  . isosorbide mononitrate (IMDUR) 60 MG 24 hr tablet TAKE 1 TABLET(60 MG) BY MOUTH DAILY  . predniSONE (DELTASONE) 5 MG tablet Take 2 tablets x 7 days, then 1 tablet daily x 7 days, then stop.  . rosuvastatin (CRESTOR) 20 MG tablet Take 2 tablets (40 mg total) by mouth daily. (Patient taking differently: Take 20 mg by mouth daily. )     Allergies:   Lipitor [atorvastatin]   Social History   Socioeconomic History  . Marital status: Divorced    Spouse name: n/a  . Number of children: 1  . Years of education: 12+  . Highest education level: Not on file  Occupational History  . Occupation: Building services engineer: [employer]  Social Needs  . Financial resource strain: Not on file  . Food insecurity    Worry: Not on file    Inability: Not on file  . Transportation needs    Medical: Not on file    Non-medical: Not on file  Tobacco Use  . Smoking status: Never Smoker  . Smokeless tobacco: Never Used  Substance and Sexual Activity  . Alcohol use: No    Alcohol/week: 0.0 standard drinks  . Drug use: No  . Sexual activity: Not Currently    Comment: Erectile dysfunciotn  Lifestyle  . Physical activity    Days per week: Not on file    Minutes per session: Not on file  . Stress: Not on file  Relationships  . Social Herbalist on phone: Not on file    Gets together: Not on file    Attends religious service: Not on file    Active member of club or organization: Not on file    Attends meetings of clubs or organizations: Not on file    Relationship status: Not on file  Other Topics  Concern  . Not on file  Social History Narrative   Divorced. Education: [employer].    Unable to exercise due to significant SOB and DOE.    Lives alone.   Son lives in [place].     Family History: The patient's family history includes Arthritis in his mother and sister; COPD in his mother and sister; Diabetes in his brother and mother; Hypertension in his father and mother. There is no history of Colon cancer. ROS:   Please see the history of present illness.    All 14 point review of systems negative except as described per history of present illness  EKGs/Labs/Other Studies Reviewed:    G showed normal sinus rhythm normal P interval there is diffuse asymmetrical T inversion in inferior  leads with some ST segment depression as well as minimal ST segment elevation in the lateral leads.  This is exactly the same changes that were noted on the EKG done at [facility] on 03/12/2019.  Patient is asymptomatic, he does not have any chest pain.  Recent Labs: 05/20/2018: Pro B Natriuretic peptide (BNP) 1,460.0; TSH 1.71 05/24/2018: B Natriuretic Peptide 1,449.4; Magnesium 2.0 07/18/2018: BUN 56; Creatinine 4.8; Hemoglobin 11.5; Platelets 299; Potassium 4.3; Sodium 139  Recent Lipid Panel    Component Value Date/Time   CHOL 197 09/24/2017 0823   CHOL 305 (H) 03/21/2017 1124   TRIG 245.0 (H) 09/24/2017 0823   HDL 45.60 09/24/2017 0823   HDL 60 03/21/2017 1124   CHOLHDL 4 09/24/2017 0823   VLDL 49.0 (H) 09/24/2017 0823   LDLCALC 212 (H) 03/21/2017 1124   LDLDIRECT 106.0 09/24/2017 0823    Physical Exam:    VS:  BP 118/64   Pulse 82   Ht 5\' 9"  (1.753 m)   Wt 175 lb 6.4 oz (79.6 kg)   SpO2 98%   BMI 25.90 kg/m     Wt Readings from Last 3 Encounters:  04/14/19 175 lb 6.4 oz (79.6 kg)  03/31/19 172 lb 9.6 oz (78.3 kg)  03/19/19 173 lb (78.5 kg)     GEN:  Well nourished, well developed in no acute distress HEENT: Normal NECK: No JVD; No carotid bruits LYMPHATICS: No  lymphadenopathy CARDIAC: RRR, no murmurs, no rubs, no gallops RESPIRATORY:  Clear to auscultation without rales, wheezing or rhonchi  ABDOMEN: Soft, non-tender, non-distended MUSCULOSKELETAL:  No edema; No deformity  SKIN: Warm and dry LOWER EXTREMITIES: no swelling NEUROLOGIC:  Alert and oriented x 3 PSYCHIATRIC:  Normal affect   ASSESSMENT:    1. Coronary artery disease involving native coronary artery of native heart without angina pectoris   2. Chronic systolic CHF (congestive heart failure) (HCC)   3. Sarcoidosis   4. 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 Coronary disease.  It looks like he will require cardiac catheterization.  Awaiting results of the echocardiogram from [facility] to decide about it.  I explained procedure already to him.  He is willing to proceed. 2. Chronic systolic congestive heart failure will get echocardiogram to look up ejection fraction.  Again it was done just last month in the office. 3. 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 4. Diabetes followed by internal medicine team. 5. Chronic kidney failure on dialysis.  He does have some drop in the blood pressure during dialysis and shortly after that.  Therefore, asked him to stop his hydralazine the day of dialysis and then after dialysis take it if his blood pressures more than 120/70.   Medication Adjustments/Labs and Tests Ordered: Current medicines are reviewed at length with the patient today.  Concerns regarding medicines are outlined above.  No orders of the defined types were placed in this encounter.  Medication changes: No orders of the defined types were placed in this encounter.   Signed, Park Liter, MD, [facility] 04/14/2019 9:54 AM    [place]

## 2019-04-14 NOTE — Patient Instructions (Signed)
Medication Instructions:  Your physician recommends that you continue on your current medications as directed. Please refer to the Current Medication list given to you today. *If you need a refill on your cardiac medications before your next appointment, please call your pharmacy*  Lab Work: None.  If you have labs (blood work) drawn today and your tests are completely normal, you will receive your results only by: Marland Kitchen MyChart Message (if you have MyChart) OR . A paper copy in the mail If you have any lab test that is abnormal or we need to change your treatment, we will call you to review the results.  Testing/Procedures: None.  Follow-Up: At [facility], you and your health needs are our priority.  As part of our continuing mission to provide you with exceptional heart care, we have created designated Provider Care Teams.  These Care Teams include your primary Cardiologist (physician) and Advanced Practice Providers (APPs -  Physician Assistants and Nurse Practitioners) who all work together to provide you with the care you need, when you need it.  Your next appointment:   1 month(s)  The format for your next appointment:   In Person  Provider:   You may see Dr. Agustin Cree or the following Advanced Practice Provider on your designated Care Team:    Laurann Montana, FNP   Other Instructions

## 2019-04-16 ENCOUNTER — Telehealth: Payer: Self-pay | Admitting: Emergency Medicine

## 2019-04-16 DIAGNOSIS — I5022 Chronic systolic (congestive) heart failure: Secondary | ICD-10-CM

## 2019-04-16 DIAGNOSIS — Z01810 Encounter for preprocedural cardiovascular examination: Secondary | ICD-10-CM

## 2019-04-16 DIAGNOSIS — I10 Essential (primary) hypertension: Secondary | ICD-10-CM

## 2019-04-16 NOTE — Telephone Encounter (Signed)
Dr. Agustin Cree reviewed echo from [place]. He confirms patient does need cardiac catheterization. Will work on setting this up for him and contact him will all information.

## 2019-04-17 NOTE — Telephone Encounter (Signed)
Called patient informed him of new appointment time and date for catheterization. Informed him of appointment date and time of covid test. Will await to here from catheterization coordinator on Monday with updates regarding his covid test needing to be rapid due to him be a dialysis patient and needing to have dialysis the day before cath. After this is relayed I will call patient and go over all pre procedural cath instructions. Patient verbally understands no further questions.

## 2019-04-20 ENCOUNTER — Encounter: Payer: Self-pay | Admitting: Cardiology

## 2019-04-20 NOTE — Telephone Encounter (Signed)
Patient would like confirmation of when his covid test is

## 2019-04-20 NOTE — Telephone Encounter (Signed)
Patient informed of all information below and covid test appointment date and time. Patient aware to have lab work this week and chest xray as well.  @[email or web address] @ [address], [place] [address] Dept: [phone] Loc: [phone]  [name]  04/20/2019  You are scheduled for a Cardiac Catheterization on Tuesday, December 15 with Dr. Glenetta Hew.  1. Please arrive at the [facility] (Main Entrance A) at [facility]: [address] [place], [place] [address] at 5:30 AM (This time is two hours before your procedure to ensure your preparation). Free valet parking service is available.   Special note: Every effort is made to have your procedure done on time. Please understand that emergencies sometimes delay scheduled procedures.  2. Diet: Do not eat solid foods after midnight.  The patient may have clear liquids until 5am upon the day of the procedure.  3. Labs: You will need to have blood drawn this week: bmp, cbc   4. Medication instructions in preparation for your procedure:    Take only 26 units of insulin the night before your procedure. Do not take any insulin on the day of the procedure.   On the morning of your procedure, take your Aspirin and any morning medicines NOT listed above.  You may use sips of water.  5. Plan for one night stay--bring personal belongings. 6. Bring a current list of your medications and current insurance cards. 7. You MUST have a responsible person to drive you home. 8. Someone MUST be with you the first 24 hours after you arrive home or your discharge will be delayed. 9. Please wear clothes that are easy to get on and off and wear slip-on shoes.  Thank you for allowing [place] to care for you!   -- [facility]

## 2019-04-20 NOTE — Addendum Note (Signed)
Addended by: Ashok Norris on: 04/20/2019 11:49 AM   Modules accepted: Orders

## 2019-04-21 ENCOUNTER — Encounter: Payer: Managed Care, Other (non HMO) | Attending: Endocrinology | Admitting: Nutrition

## 2019-04-21 ENCOUNTER — Ambulatory Visit (HOSPITAL_BASED_OUTPATIENT_CLINIC_OR_DEPARTMENT_OTHER)
Admission: RE | Admit: 2019-04-21 | Discharge: 2019-04-21 | Disposition: A | Discharge status: Home / Self Care | Payer: Managed Care, Other (non HMO) | Source: Ambulatory Visit | Attending: Cardiology | Admitting: Cardiology

## 2019-04-21 ENCOUNTER — Other Ambulatory Visit: Payer: Self-pay

## 2019-04-21 ENCOUNTER — Other Ambulatory Visit: Payer: Self-pay | Admitting: *Deleted

## 2019-04-21 DIAGNOSIS — Z01810 Encounter for preprocedural cardiovascular examination: Secondary | ICD-10-CM | POA: Insufficient documentation

## 2019-04-21 DIAGNOSIS — I5022 Chronic systolic (congestive) heart failure: Secondary | ICD-10-CM | POA: Insufficient documentation

## 2019-04-21 DIAGNOSIS — Z01812 Encounter for preprocedural laboratory examination: Secondary | ICD-10-CM

## 2019-04-21 DIAGNOSIS — I10 Essential (primary) hypertension: Secondary | ICD-10-CM | POA: Insufficient documentation

## 2019-04-21 DIAGNOSIS — E0821 Diabetes mellitus due to underlying condition with diabetic nephropathy: Secondary | ICD-10-CM | POA: Insufficient documentation

## 2019-04-21 NOTE — Progress Notes (Signed)
Pt. Is here to review his diet and blood sugar readings.  He is wearing a Libre sensor, but did not bring his reader.  Says blood sugars  Are usually "good- in the mid 100s in the morning, and at bedtime.  Will bring his reader tomorrow to download.  He says that his blood sugars go high after 4PM meal, and stay high.   He was told to bring his reader by for download tomorrow after dialysis.  He agreed to do this. Typical day:  Monday, Wednesday, and Friday:  5:30 up  Eats bfast:  1 boiled egg, 1 orange, and 1 packet of sweetened oatmeal made with water. 6:30-10:30 dialysis 11AM: Home: takes his 52u of insulin.  Not hungry, so does not eat until 4-5PM.  Ususally rests. 5PM: super:  Baked chicken, with 45 grams of carb in the form of starchy veg, and 1 piece of bread-no butter Water to drink  Limits water to 32 ounces per day. 10PM: P&J sandwich ON Tues, Thurs., Sat., and Sunday:  Diet is identical, but times of eating are 2 hours later.   Does not exercise--no energy. Plan: No diet changes made.  Pt. To bring in reader tomorrow for review.   Says would be willing to take another injection before his supper meal if need be.

## 2019-04-21 NOTE — Patient Instructions (Signed)
Bring reader in for download tomorrow.

## 2019-04-23 ENCOUNTER — Ambulatory Visit (INDEPENDENT_AMBULATORY_CARE_PROVIDER_SITE_OTHER): Payer: Managed Care, Other (non HMO)

## 2019-04-23 ENCOUNTER — Other Ambulatory Visit: Payer: Self-pay

## 2019-04-23 ENCOUNTER — Other Ambulatory Visit: Payer: Managed Care, Other (non HMO)

## 2019-04-23 ENCOUNTER — Other Ambulatory Visit: Payer: Managed Care, Other (non HMO) | Admitting: *Deleted

## 2019-04-23 ENCOUNTER — Telehealth: Payer: Self-pay | Admitting: *Deleted

## 2019-04-23 DIAGNOSIS — E119 Type 2 diabetes mellitus without complications: Secondary | ICD-10-CM

## 2019-04-23 NOTE — Progress Notes (Signed)
At [facility] (diabetic nurse educator) request, pt was advised to present for a meter download. Pt states he saw Leonia Reader, RN on Monday, did not have his reader and she could therefore NOT review his CBG's. Reviewed [facility] and it appears pt had never been set up within their database. Given this information, link was sent to pt email address for him to begin sharing CBG readings with our office. Once link received, pt expressed confusion re: steps required to complete sharing process. Assistance and education provided to pt to download the [employer], to set up a [employer] account, how to scan sensor with his phone so he can begin sharing data with our office and also how to use the app going forward. Return demonstration completed and proved successful. Using closed-loop communication, pt verbalized complete acceptance and understanding of all information provided as well as use of his phone with sensor. No further questions nor concerns were voiced at this time. Review of pt CBG data within [facility] also proved successful. Pt states he will be receiving a call from Leonia Reader, RN on Monday (04/27/19). Pt reminded to inform Vaughan Basta that she is able to access his data within our office WITHOUT the need to download his reader. Again, using closed-loop communication, pt verbalized complete acceptance and understanding of all information provided. No further questions nor concerns were voiced at this time.

## 2019-04-23 NOTE — Telephone Encounter (Signed)
Pt contacted pre-catheterization scheduled at [facility] for: Tuesday April 28, 2019 7:30 AM Verified arrival time and place: [facility]) at: 5:30 AM   No solid food after midnight prior to cath, clear liquids until 5 AM day of procedure. Contrast allergy: no  Hold: Insulin-AM of procedure  Except hold medications AM meds can be  taken pre-cath with sip of water including: ASA 81 mg   Confirmed patient has responsible adult to drive home post procedure and observe 24 hours after arriving home: yes  Currently, due to Covid-19 pandemic, only one support person will be allowed with patient. Must be the same support person for that patient's entire stay, will be screened and required to wear a mask. They will be asked to wait in the waiting room for the duration of the patient's stay.  Patients are required to wear a mask when they enter the hospital.      COVID-19 Pre-Screening Questions:  . In the past 7 to 10 days have you had a cough,  shortness of breath, headache, congestion, fever (100 or greater) body aches, chills, sore throat, or sudden loss of taste or sense of smell? no . Have you been around anyone with known Covid 19? no . Have you been around anyone who is awaiting Covid 19 test results in the past 7 to 10 days? no . Have you been around anyone who has been exposed to Covid 19, or has mentioned symptoms of Covid 19 within the past 7 to 10 days? no   I reviewed procedure/mask/visitor instructions with patient, he verbalized understanding, thanked me for call.  Pt has dialysis MWF, is scheduled for pre procedure Covid after dialysis on Monday, GV test site aware cath is scheduled for 04/28/19 7:30 AM

## 2019-04-24 LAB — BASIC METABOLIC PANEL
BUN/Creatinine Ratio: 5 — ABNORMAL LOW (ref 9–20)
BUN: 35 mg/dL — ABNORMAL HIGH (ref 6–24)
CO2: 26 mmol/L (ref 20–29)
Calcium: 9.2 mg/dL (ref 8.7–10.2)
Chloride: 91 mmol/L — ABNORMAL LOW (ref 96–106)
Creatinine, Ser: 6.52 mg/dL — ABNORMAL HIGH (ref 0.76–1.27)
GFR calc Af Amer: 10 mL/min/{1.73_m2} — ABNORMAL LOW (ref >59)
GFR calc non Af Amer: 9 mL/min/{1.73_m2} — ABNORMAL LOW (ref >59)
Glucose: 330 mg/dL — ABNORMAL HIGH (ref 65–99)
Potassium: 4.4 mmol/L (ref 3.5–5.2)
Sodium: 135 mmol/L (ref 134–144)

## 2019-04-24 LAB — CBC
Hematocrit: 35.7 % — ABNORMAL LOW (ref 37.5–51.0)
Hemoglobin: 11.8 g/dL — ABNORMAL LOW (ref 13.0–17.7)
MCH: 28.2 pg (ref 26.6–33.0)
MCHC: 33.1 g/dL (ref 31.5–35.7)
MCV: 85 fL (ref 79–97)
Platelets: 246 10*3/uL (ref 150–450)
RBC: 4.19 x10E6/uL (ref 4.14–5.80)
RDW: 14.7 % (ref 11.6–15.4)
WBC: 8.6 10*3/uL (ref 3.4–10.8)

## 2019-04-27 ENCOUNTER — Other Ambulatory Visit (HOSPITAL_COMMUNITY)
Admission: RE | Admit: 2019-04-27 | Discharge: 2019-04-27 | Disposition: A | Discharge status: Home / Self Care | Payer: Managed Care, Other (non HMO) | Source: Ambulatory Visit | Attending: Cardiology | Admitting: Cardiology

## 2019-04-27 DIAGNOSIS — Z20828 Contact with and (suspected) exposure to other viral communicable diseases: Secondary | ICD-10-CM | POA: Diagnosis not present

## 2019-04-27 DIAGNOSIS — Z01812 Encounter for preprocedural laboratory examination: Secondary | ICD-10-CM | POA: Insufficient documentation

## 2019-04-27 LAB — SARS CORONAVIRUS 2 (TAT 6-24 HRS): SARS Coronavirus 2: NEGATIVE

## 2019-04-28 ENCOUNTER — Other Ambulatory Visit: Payer: Self-pay

## 2019-04-28 ENCOUNTER — Encounter (HOSPITAL_COMMUNITY)
Admission: RE | Disposition: A | Discharge status: Home / Self Care | Payer: Medicaid Other | Source: Home / Self Care | Attending: Cardiology

## 2019-04-28 ENCOUNTER — Ambulatory Visit (HOSPITAL_COMMUNITY)
Admission: RE | Admit: 2019-04-28 | Discharge: 2019-04-28 | Disposition: A | Discharge status: Home / Self Care | Payer: Managed Care, Other (non HMO) | Attending: Cardiology | Admitting: Cardiology

## 2019-04-28 DIAGNOSIS — I5022 Chronic systolic (congestive) heart failure: Secondary | ICD-10-CM | POA: Insufficient documentation

## 2019-04-28 DIAGNOSIS — E785 Hyperlipidemia, unspecified: Secondary | ICD-10-CM | POA: Diagnosis not present

## 2019-04-28 DIAGNOSIS — E1122 Type 2 diabetes mellitus with diabetic chronic kidney disease: Secondary | ICD-10-CM | POA: Insufficient documentation

## 2019-04-28 DIAGNOSIS — R931 Abnormal findings on diagnostic imaging of heart and coronary circulation: Secondary | ICD-10-CM | POA: Diagnosis present

## 2019-04-28 DIAGNOSIS — Z7982 Long term (current) use of aspirin: Secondary | ICD-10-CM | POA: Diagnosis not present

## 2019-04-28 DIAGNOSIS — I132 Hypertensive heart and chronic kidney disease with heart failure and with stage 5 chronic kidney disease, or end stage renal disease: Secondary | ICD-10-CM | POA: Insufficient documentation

## 2019-04-28 DIAGNOSIS — Z79899 Other long term (current) drug therapy: Secondary | ICD-10-CM | POA: Diagnosis not present

## 2019-04-28 DIAGNOSIS — D860 Sarcoidosis of lung: Secondary | ICD-10-CM | POA: Diagnosis not present

## 2019-04-28 DIAGNOSIS — I2510 Atherosclerotic heart disease of native coronary artery without angina pectoris: Secondary | ICD-10-CM | POA: Insufficient documentation

## 2019-04-28 DIAGNOSIS — Z794 Long term (current) use of insulin: Secondary | ICD-10-CM | POA: Insufficient documentation

## 2019-04-28 DIAGNOSIS — N186 End stage renal disease: Secondary | ICD-10-CM

## 2019-04-28 DIAGNOSIS — Z992 Dependence on renal dialysis: Secondary | ICD-10-CM | POA: Diagnosis not present

## 2019-04-28 DIAGNOSIS — I429 Cardiomyopathy, unspecified: Secondary | ICD-10-CM

## 2019-04-28 HISTORY — PX: LEFT HEART CATH AND CORONARY ANGIOGRAPHY: CATH118249

## 2019-04-28 HISTORY — DX: Abnormal findings on diagnostic imaging of heart and coronary circulation: R93.1

## 2019-04-28 LAB — GLUCOSE, CAPILLARY: Glucose-Capillary: 272 mg/dL — ABNORMAL HIGH (ref 70–99)

## 2019-04-28 SURGERY — LEFT HEART CATH AND CORONARY ANGIOGRAPHY
Anesthesia: LOCAL

## 2019-04-28 MED ORDER — HEPARIN (PORCINE) IN NACL 1000-0.9 UT/500ML-% IV SOLN
INTRAVENOUS | Status: DC | PRN
  Administered 2019-04-28 (×2): 500 mL

## 2019-04-28 MED ORDER — SODIUM CHLORIDE 0.9% FLUSH: 3.0000 mL | Freq: Two times a day (BID) | INTRAVENOUS | Status: DC

## 2019-04-28 MED ORDER — HEPARIN (PORCINE) IN NACL 1000-0.9 UT/500ML-% IV SOLN
INTRAVENOUS | Status: AC
  Filled 2019-04-28: qty 1000

## 2019-04-28 MED ORDER — MIDAZOLAM HCL 2 MG/2ML IJ SOLN
INTRAMUSCULAR | Status: DC | PRN
  Administered 2019-04-28: 1 mg via INTRAVENOUS

## 2019-04-28 MED ORDER — SODIUM CHLORIDE 0.9 % IV SOLN: INTRAVENOUS | Status: DC

## 2019-04-28 MED ORDER — FENTANYL CITRATE (PF) 100 MCG/2ML IJ SOLN
INTRAMUSCULAR | Status: DC | PRN
  Administered 2019-04-28: 25 ug via INTRAVENOUS

## 2019-04-28 MED ORDER — SODIUM CHLORIDE 0.9 % IV SOLN: 250.0000 mL | INTRAVENOUS | Status: DC | PRN

## 2019-04-28 MED ORDER — LIDOCAINE HCL (PF) 1 % IJ SOLN
INTRAMUSCULAR | Status: AC
  Filled 2019-04-28: qty 30

## 2019-04-28 MED ORDER — ASPIRIN 81 MG PO CHEW: 81.0000 mg | CHEWABLE_TABLET | ORAL | Status: DC

## 2019-04-28 MED ORDER — LIDOCAINE HCL (PF) 1 % IJ SOLN
INTRAMUSCULAR | Status: DC | PRN
  Administered 2019-04-28: 10 mL

## 2019-04-28 MED ORDER — MIDAZOLAM HCL 2 MG/2ML IJ SOLN
INTRAMUSCULAR | Status: AC
  Filled 2019-04-28: qty 2

## 2019-04-28 MED ORDER — SODIUM CHLORIDE 0.9% FLUSH: 3.0000 mL | INTRAVENOUS | Status: DC | PRN

## 2019-04-28 MED ORDER — IOHEXOL 350 MG/ML SOLN
INTRAVENOUS | Status: DC | PRN
  Administered 2019-04-28: 80 mL

## 2019-04-28 MED ORDER — FENTANYL CITRATE (PF) 100 MCG/2ML IJ SOLN
INTRAMUSCULAR | Status: AC
  Filled 2019-04-28: qty 2

## 2019-04-28 SURGICAL SUPPLY — 9 items
CATH DXT MULTI JL4 JR4 ANG PIG (CATHETERS) ×2 IMPLANT
CLOSURE MYNX CONTROL 5F (Vascular Products) ×2 IMPLANT
KIT HEART LEFT (KITS) ×2 IMPLANT
PACK CARDIAC CATHETERIZATION (CUSTOM PROCEDURE TRAY) ×2 IMPLANT
SHEATH PINNACLE 5F 10CM (SHEATH) ×2 IMPLANT
SHEATH PROBE COVER 6X72 (BAG) ×2 IMPLANT
TRANSDUCER W/STOPCOCK (MISCELLANEOUS) ×2 IMPLANT
TUBING CIL FLEX 10 FLL-RA (TUBING) ×2 IMPLANT
WIRE EMERALD 3MM-J .035X150CM (WIRE) ×2 IMPLANT

## 2019-04-28 NOTE — Interval H&P Note (Signed)
History and Physical Interval Note:  04/28/2019 7:35 AM  Brandon Carroll  has presented today for surgery, with the diagnosis of CAD, Pre renal transplant, abnormal Echo.    The various methods of treatment have been discussed with the patient and family. After consideration of risks, benefits and other options for treatment, the patient has consented to  Procedure(s): LEFT HEART CATH AND CORONARY ANGIOGRAPHY (N/A)  PERCUTANEOUS CORONARY INTERVENTION   as a surgical intervention.  The patient's history has been reviewed, patient examined, no change in status, stable for surgery.  I have reviewed the patient's chart and labs.  Questions were answered to the patient's satisfaction.     Glenetta Hew

## 2019-04-28 NOTE — Discharge Instructions (Signed)
    Discharge Instructions  Please refer to the following instructions for your post-procedure care. Your surgeon or physician assistant will discuss any changes with you.  Activity  Avoid lifting more than 8 pounds (1 gallons of milk) for 72 hours (3 days) after your procedure. You may walk as much as you can tolerate. It's OK to drive after 72 hours.  Bathing/Showering  You may shower the day after your procedure. If you have a bandage, you may remove it at 24 hours. Clean your incision site with mild soap and water. Pat the area dry with a clean towel.  Diet  Resume your pre-procedure diet. There are no special food restrictions following this procedure. In order to heal from your surgery, it is CRITICAL to get adequate nutrition. Your body requires vitamins, minerals, and protein. Vegetables are the best source of vitamins and minerals. Vegetables also provide the perfect balance of protein. Processed food has little nutritional value, so try to avoid this.  Medications  Resume taking all of your medications unless your doctor tells you not to. If your incision is causing pain, you may take over-the-counter pain relievers such as acetaminophen (Tylenol)  Follow Up  Follow up will be arranged at the time of your procedure. You may have an office visit scheduled or may be scheduled for surgery. Ask your surgeon if you have any questions.  Please call us immediately for any of the following conditions: .Severe or worsening pain your legs or feet at rest or with walking. .Increased pain, redness, drainage at your groin puncture site. .Fever of 101 degrees or higher. .If you have any mild or slow bleeding from your puncture site: lie down, apply firm constant pressure over the area with a piece of gauze or a clean wash cloth for 30 minutes- no peeking!, call 911 right away if you are still bleeding after 30 minutes, or if the bleeding is heavy and unmanageable.  Reduce your risk  factors of vascular disease:  Stop smoking. If you would like help call [facility] at 1-800-QUIT-NOW 412 314 1220) or [place] at (501)468-9955. Manage your cholesterol Maintain a desired weight Control your diabetes Keep your blood pressure down

## 2019-05-14 ENCOUNTER — Ambulatory Visit (INDEPENDENT_AMBULATORY_CARE_PROVIDER_SITE_OTHER): Payer: Managed Care, Other (non HMO) | Admitting: Cardiology

## 2019-05-14 ENCOUNTER — Encounter: Payer: Self-pay | Admitting: Cardiology

## 2019-05-14 ENCOUNTER — Other Ambulatory Visit: Payer: Self-pay

## 2019-05-14 VITALS — BP 100/64 | HR 81 | Ht 69.0 in | Wt 177.8 lb

## 2019-05-14 DIAGNOSIS — I5022 Chronic systolic (congestive) heart failure: Secondary | ICD-10-CM

## 2019-05-14 DIAGNOSIS — I2510 Atherosclerotic heart disease of native coronary artery without angina pectoris: Secondary | ICD-10-CM

## 2019-05-14 DIAGNOSIS — E1169 Type 2 diabetes mellitus with other specified complication: Secondary | ICD-10-CM | POA: Diagnosis not present

## 2019-05-14 DIAGNOSIS — E785 Hyperlipidemia, unspecified: Secondary | ICD-10-CM

## 2019-05-14 DIAGNOSIS — R0600 Dyspnea, unspecified: Secondary | ICD-10-CM

## 2019-05-14 DIAGNOSIS — R0609 Other forms of dyspnea: Secondary | ICD-10-CM

## 2019-05-14 MED ORDER — HYDRALAZINE HCL 25 MG PO TABS: 25.0000 mg | ORAL_TABLET | Freq: Three times a day (TID) | ORAL | 3 refills | Status: DC

## 2019-05-14 NOTE — Patient Instructions (Signed)
Medication Instructions:  Your physician has recommended you make the following change in your medication:  1.  REDUCE the Hydralazine to 25 mg taking 1 tablet three times a day   *If you need a refill on your cardiac medications before your next appointment, please call your pharmacy*  Lab Work: None ordered  If you have labs (blood work) drawn today and your tests are completely normal, you will receive your results only by: Marland Kitchen MyChart Message (if you have MyChart) OR . A paper copy in the mail If you have any lab test that is abnormal or we need to change your treatment, we will call you to review the results.  Testing/Procedures: None ordered  Follow-Up: At [facility], you and your health needs are our priority.  As part of our continuing mission to provide you with exceptional heart care, we have created designated Provider Care Teams.  These Care Teams include your primary Cardiologist (physician) and Advanced Practice Providers (APPs -  Physician Assistants and Nurse Practitioners) who all work together to provide you with the care you need, when you need it.  Your next appointment:   3 month(s)  The format for your next appointment:   In Person  Provider:   Jenne Campus, MD  Other Instructions

## 2019-05-14 NOTE — Addendum Note (Signed)
Addended by: Gaetano Net on: 05/14/2019 11:00 AM   Modules accepted: Orders

## 2019-05-14 NOTE — Progress Notes (Signed)
Cardiology Office Note:    Date:  05/14/2019   ID:  Brandon Carroll, DOB 1961-03-14, MRN [id number]  PCP:  Hoyt Koch, MD  Cardiologist:  Jenne Campus, MD    Referring MD: Hoyt Koch, *   No chief complaint on file. Doing well  History of Present Illness:    Brandon Carroll is a 58 y.o. male with congestive heart failure, New York Heart Association class I/II, recent cardiac catheterization showing mild to moderate disease of the ostial circumflex as well as proximal RCA.  No target lesion for intervention, preserved left ventricular ejection fraction.  No obvious wall motion abnormality on the cardiac catheterization.  He comes today after cardiac catheterization so we can talk about this issue overall he is doing well but complain of having lightheadedness especially when he gets up very quickly.  His blood pressure is low therefore asked him to lower his hydralazine.  In the future we probably will have to lower or discontinue isosorbide.  On the echocardiogram he did have segmental wall motion abnormality however those are not noted on the cardiac catheterization.  Therefore, in the future we will be able to cut down some of his medication.  Past Medical History:  Diagnosis Date  . Acute systolic CHF (congestive heart failure), NYHA class 2 ([place]) 05/04/2015  . Acute-on-chronic kidney injury ([place]) 05/04/2015  . Diabetes mellitus   . Erectile dysfunction   . Hyperlipidemia   . Hypertension     Past Surgical History:  Procedure Laterality Date  . AV FISTULA PLACEMENT Left 05/26/2018   Procedure: ARTERIOVENOUS (AV) FISTULA CREATION LEFT ARM;  Surgeon: Marty Heck, MD;  Location: [facility];  Service: Vascular;  Laterality: Left;  . CARDIAC CATHETERIZATION N/A 06/15/2015   Procedure: Left Heart Cath and Coronary Angiography;  Surgeon: Peter M Martinique, MD;  Location: [facility];  Service: Cardiovascular;  Laterality: N/A;  . CHOLECYSTECTOMY N/A  06/28/2017   Procedure: LAPAROSCOPIC CHOLECYSTECTOMY;  Surgeon: Ralene Ok, MD;  [facility];  Service: General;  Laterality: N/A;  . HERNIA REPAIR  7th grade   umbilical  . LEFT HEART CATH AND CORONARY ANGIOGRAPHY N/A 04/28/2019   Procedure: LEFT HEART CATH AND CORONARY ANGIOGRAPHY;  Surgeon: Leonie Man, MD;  Location: [facility];  Service: Cardiovascular;  Laterality: N/A;    Current Medications: Current Meds  Medication Sig  . aspirin EC 81 MG tablet Take 81 mg by mouth daily.  . BD PEN NEEDLE NANO U/F 32G X 4 MM MISC USE ONCE DAILY AS DIRECTED  . carvedilol (COREG) 6.25 MG tablet TAKE 1 TABLET(6.25 MG) BY MOUTH TWICE DAILY (Patient taking differently: Take 6.25 mg by mouth 2 (two) times daily with a meal. )  . Continuous Blood Gluc Receiver (FREESTYLE LIBRE 14 DAY READER) DEVI USE AS DIRECTED  . Continuous Blood Gluc Sensor (FREESTYLE LIBRE 14 DAY SENSOR) MISC 1 each by Does not apply route every 14 (fourteen) days. For use with continuous glucose monitoring system. Change every 14 days  . glucose blood (ONETOUCH VERIO) test strip 1 each by Other route 2 (two) times daily as needed for other. And lancets 2/day  . hydrALAZINE (APRESOLINE) 50 MG tablet Take 50 mg by mouth 3 (three) times daily.  . Insulin Glargine (LANTUS SOLOSTAR) 100 UNIT/ML Solostar Pen Inject 52 Units into the skin every morning. And pen needles 1/day (Patient taking differently: Inject 52 Units into the skin every morning. )  . isosorbide mononitrate (IMDUR) 60 MG  24 hr tablet TAKE 1 TABLET(60 MG) BY MOUTH DAILY  . predniSONE (DELTASONE) 5 MG tablet Take 2 tablets x 7 days, then 1 tablet daily x 7 days, then stop.  . rosuvastatin (CRESTOR) 20 MG tablet Take 2 tablets (40 mg total) by mouth daily.     Allergies:   Lipitor [atorvastatin]   Social History   Socioeconomic History  . Marital status: Divorced    Spouse name: n/a  . Number of children: 1  . Years of education: 12+  . Highest  education level: Not on file  Occupational History  . Occupation: Building services engineer: [employer]  Tobacco Use  . Smoking status: Never Smoker  . Smokeless tobacco: Never Used  Substance and Sexual Activity  . Alcohol use: No    Alcohol/week: 0.0 standard drinks  . Drug use: No  . Sexual activity: Not Currently    Comment: Erectile dysfunciotn  Other Topics Concern  . Not on file  Social History Narrative   Divorced. Education: [employer].    Unable to exercise due to significant SOB and DOE.    Lives alone.   Son lives in [place].   Social Determinants of Health   Financial Resource Strain:   . Difficulty of Paying Living Expenses: Not on file  Food Insecurity:   . Worried About Charity fundraiser in the Last Year: Not on file  . Ran Out of Food in the Last Year: Not on file  Transportation Needs:   . Lack of Transportation (Medical): Not on file  . Lack of Transportation (Non-Medical): Not on file  Physical Activity:   . Days of Exercise per Week: Not on file  . Minutes of Exercise per Session: Not on file  Stress:   . Feeling of Stress : Not on file  Social Connections:   . Frequency of Communication with Friends and Family: Not on file  . Frequency of Social Gatherings with Friends and Family: Not on file  . Attends Religious Services: Not on file  . Active Member of Clubs or Organizations: Not on file  . Attends Archivist Meetings: Not on file  . Marital Status: Not on file     Family History: The patient's family history includes Arthritis in his mother and sister; COPD in his mother and sister; Diabetes in his brother and mother; Hypertension in his father and mother. There is no history of Colon cancer. ROS:   Please see the history of present illness.    All 14 point review of systems negative except as described per history of present illness  EKGs/Labs/Other Studies Reviewed:      Recent Labs: 05/20/2018: Pro B Natriuretic peptide  (BNP) 1,460.0; TSH 1.71 05/24/2018: B Natriuretic Peptide 1,449.4; Magnesium 2.0 04/23/2019: BUN 35; Creatinine, Ser 6.52; Hemoglobin 11.8; Platelets 246; Potassium 4.4; Sodium 135  Recent Lipid Panel    Component Value Date/Time   CHOL 197 09/24/2017 0823   CHOL 305 (H) 03/21/2017 1124   TRIG 245.0 (H) 09/24/2017 0823   HDL 45.60 09/24/2017 0823   HDL 60 03/21/2017 1124   CHOLHDL 4 09/24/2017 0823   VLDL 49.0 (H) 09/24/2017 0823   LDLCALC 212 (H) 03/21/2017 1124   LDLDIRECT 106.0 09/24/2017 0823    Physical Exam:    VS:  BP 100/64   Pulse 81   Ht 5\' 9"  (1.753 m)   Wt 177 lb 12.8 oz (80.6 kg)   SpO2 98%   BMI 26.26 kg/m  Wt Readings from Last 3 Encounters:  05/14/19 177 lb 12.8 oz (80.6 kg)  04/28/19 175 lb (79.4 kg)  04/21/19 178 lb 9.6 oz (81 kg)     GEN:  Well nourished, well developed in no acute distress HEENT: Normal NECK: No JVD; No carotid bruits LYMPHATICS: No lymphadenopathy CARDIAC: RRR, no murmurs, no rubs, no gallops RESPIRATORY:  Clear to auscultation without rales, wheezing or rhonchi  ABDOMEN: Soft, non-tender, non-distended MUSCULOSKELETAL:  No edema; No deformity  SKIN: Warm and dry LOWER EXTREMITIES: no swelling NEUROLOGIC:  Alert and oriented x 3 PSYCHIATRIC:  Normal affect   ASSESSMENT:    1. Coronary artery disease involving native coronary artery of native heart without angina pectoris   2. Chronic systolic CHF (congestive heart failure) ([place])   3. Hyperlipidemia associated with type 2 diabetes mellitus ([place])   4. Dyspnea on exertion    PLAN:    In order of problems listed above:  1. Coronary disease with noncritical lesions identified.  Continue conservative approach. 2. Chronic systolic congestive heart failure but his cardiac catheterization showed vigorous contractility of the LV without segmental motion of normalities.  We will cut down hydralazine to 25 3 times daily 3. Dyslipidemia we will continue Crestor.  We will follow-up  fasting lipid profile. 4. Dyspnea on exertion doing well.   Medication Adjustments/Labs and Tests Ordered: Current medicines are reviewed at length with the patient today.  Concerns regarding medicines are outlined above.  No orders of the defined types were placed in this encounter.  Medication changes: No orders of the defined types were placed in this encounter.   Signed, Park Liter, MD, [facility] 05/14/2019 10:49 AM    [place]

## 2019-06-04 ENCOUNTER — Other Ambulatory Visit: Payer: Self-pay

## 2019-06-04 ENCOUNTER — Encounter: Payer: Self-pay | Admitting: Endocrinology

## 2019-06-04 ENCOUNTER — Ambulatory Visit (INDEPENDENT_AMBULATORY_CARE_PROVIDER_SITE_OTHER): Payer: Managed Care, Other (non HMO) | Admitting: Endocrinology

## 2019-06-04 VITALS — BP 130/80 | HR 88 | Ht 69.0 in | Wt 180.4 lb

## 2019-06-04 DIAGNOSIS — Z9114 Patient's other noncompliance with medication regimen: Secondary | ICD-10-CM

## 2019-06-04 DIAGNOSIS — E119 Type 2 diabetes mellitus without complications: Secondary | ICD-10-CM

## 2019-06-04 DIAGNOSIS — N189 Chronic kidney disease, unspecified: Secondary | ICD-10-CM | POA: Diagnosis not present

## 2019-06-04 DIAGNOSIS — E1122 Type 2 diabetes mellitus with diabetic chronic kidney disease: Secondary | ICD-10-CM | POA: Diagnosis not present

## 2019-06-04 DIAGNOSIS — Z794 Long term (current) use of insulin: Secondary | ICD-10-CM | POA: Diagnosis not present

## 2019-06-04 DIAGNOSIS — N185 Chronic kidney disease, stage 5: Secondary | ICD-10-CM

## 2019-06-04 DIAGNOSIS — E0822 Diabetes mellitus due to underlying condition with diabetic chronic kidney disease: Secondary | ICD-10-CM

## 2019-06-04 LAB — POCT GLYCOSYLATED HEMOGLOBIN (HGB A1C): Hemoglobin A1C: 8.5 % — AB (ref 4.0–5.6)

## 2019-06-04 MED ORDER — BD PEN NEEDLE NANO U/F 32G X 4 MM MISC: 1.0000 | Freq: Every day | 0 refills | Status: DC

## 2019-06-04 MED ORDER — HUMULIN N KWIKPEN 100 UNIT/ML ~~LOC~~ SUPN: 50.0000 [IU] | PEN_INJECTOR | SUBCUTANEOUS | 11 refills | Status: DC

## 2019-06-04 MED ORDER — FREESTYLE LIBRE 14 DAY SENSOR MISC: 1.0000 | 11 refills | Status: DC

## 2019-06-04 NOTE — Patient Instructions (Signed)
I have sent a prescription to your pharmacy, to change the Lantus to NPH, 50 units each morning. On this type of insulin schedule, you should eat meals on a regular schedule.  If a meal is missed or significantly delayed, your blood sugar could go low.   Please come back for a follow-up appointment in 2 weeks.

## 2019-06-04 NOTE — Progress Notes (Signed)
Subjective:    Patient ID: Brandon Carroll, male    DOB: 1961/02/22, 59 y.o.   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   Dx'ed: 99991111 Complications: renal failure (no HD yet).   Therapy: insulin since 2014.   DKA: never.  Severe hypoglycemia: once (early 2020) Pancreatitis: never.  SDOH: he is on QD insulin, due to h/o noncompliance Other: He works 1st shift, as a courier; ; fructosamine converts to slightly higher A1c than cbg's and A1c itself.   Interval history: I reviewed continuous glucose monitor data. Glucose varies from 60-300.  It is in general higher as the day goes on.  pt states he feels well in general.   Pt says he misses the insulin once per week. He sometimes takes Lantus later in the day Past Medical History:  Diagnosis Date  . Acute systolic CHF (congestive heart failure), NYHA class 2 ([place]) 05/04/2015  . Acute-on-chronic kidney injury ([place]) 05/04/2015  . Diabetes mellitus   . Erectile dysfunction   . Hyperlipidemia   . Hypertension     Past Surgical History:  Procedure Laterality Date  . AV FISTULA PLACEMENT Left 05/26/2018   Procedure: ARTERIOVENOUS (AV) FISTULA CREATION LEFT ARM;  Surgeon: Marty Heck, MD;  Location: [facility];  Service: Vascular;  Laterality: Left;  . CARDIAC CATHETERIZATION N/A 06/15/2015   Procedure: Left Heart Cath and Coronary Angiography;  Surgeon: Peter M Martinique, MD;  Location: [facility];  Service: Cardiovascular;  Laterality: N/A;  . CHOLECYSTECTOMY N/A 06/28/2017   Procedure: LAPAROSCOPIC CHOLECYSTECTOMY;  Surgeon: Ralene Ok, MD;  [facility];  Service: General;  Laterality: N/A;  . HERNIA REPAIR  7th grade   umbilical  . LEFT HEART CATH AND CORONARY ANGIOGRAPHY N/A 04/28/2019   Procedure: LEFT HEART CATH AND CORONARY ANGIOGRAPHY;  Surgeon: Leonie Man, MD;  Location: [facility];  Service:  Cardiovascular;  Laterality: N/A;    Social History   Socioeconomic History  . Marital status: Divorced    Spouse name: n/a  . Number of children: 1  . Years of education: 12+  . Highest education level: Not on file  Occupational History  . Occupation: Building services engineer: [employer]  Tobacco Use  . Smoking status: Never Smoker  . Smokeless tobacco: Never Used  Substance and Sexual Activity  . Alcohol use: No    Alcohol/week: 0.0 standard drinks  . Drug use: No  . Sexual activity: Not Currently    Comment: Erectile dysfunciotn  Other Topics Concern  . Not on file  Social History Narrative   Divorced. Education: [employer].    Unable to exercise due to significant SOB and DOE.    Lives alone.   Son lives in [place].   Social Determinants of Health   Financial Resource Strain:   . Difficulty of Paying Living Expenses: Not on file  Food Insecurity:   . Worried About Charity fundraiser in the Last Year: Not on file  . Ran Out of Food in the Last Year: Not on file  Transportation Needs:   . Lack of Transportation (Medical): Not on file  . Lack of Transportation (Non-Medical): Not on file  Physical Activity:   . Days of Exercise per Week: Not on file  . Minutes of Exercise per Session: Not on file  Stress:   . Feeling of Stress : Not  on file  Social Connections:   . Frequency of Communication with Friends and Family: Not on file  . Frequency of Social Gatherings with Friends and Family: Not on file  . Attends Religious Services: Not on file  . Active Member of Clubs or Organizations: Not on file  . Attends Archivist Meetings: Not on file  . Marital Status: Not on file  Intimate Partner Violence:   . Fear of Current or Ex-Partner: Not on file  . Emotionally Abused: Not on file  . Physically Abused: Not on file  . Sexually Abused: Not on file    Current Outpatient Medications on File Prior to Visit  Medication Sig Dispense Refill  . aspirin EC  81 MG tablet Take 81 mg by mouth daily.    . carvedilol (COREG) 6.25 MG tablet TAKE 1 TABLET(6.25 MG) BY MOUTH TWICE DAILY (Patient taking differently: Take 6.25 mg by mouth 2 (two) times daily with a meal. ) 180 tablet 1  . Continuous Blood Gluc Receiver (FREESTYLE LIBRE 14 DAY READER) DEVI USE AS DIRECTED 6 Device 3  . glucose blood (ONETOUCH VERIO) test strip 1 each by Other route 2 (two) times daily as needed for other. And lancets 2/day 100 each 12  . hydrALAZINE (APRESOLINE) 25 MG tablet Take 1 tablet (25 mg total) by mouth 3 (three) times daily. 270 tablet 3  . isosorbide mononitrate (IMDUR) 60 MG 24 hr tablet TAKE 1 TABLET(60 MG) BY MOUTH DAILY 90 tablet 0  . predniSONE (DELTASONE) 5 MG tablet Take 2 tablets x 7 days, then 1 tablet daily x 7 days, then stop. 21 tablet 0  . rosuvastatin (CRESTOR) 20 MG tablet Take 2 tablets (40 mg total) by mouth daily. 90 tablet 3   No current facility-administered medications on file prior to visit.    Allergies  Allergen Reactions  . Lipitor [Atorvastatin] Hives and Itching    Family History  Problem Relation Age of Onset  . Arthritis Mother   . COPD Mother   . Diabetes Mother        was thin, took insulin  . Hypertension Mother   . Hypertension Father   . Arthritis Sister        rheumatoid  . COPD Sister   . Diabetes Brother   . Colon cancer Neg Hx     BP 130/80 (BP Location: Left Arm, Patient Position: Sitting, Cuff Size: Normal)   Pulse 88   Ht 5\' 9"  (1.753 m)   Wt 180 lb 6.4 oz (81.8 kg)   SpO2 98%   BMI 26.64 kg/m    Review of Systems Denies LOC    Objective:   Physical Exam VITAL SIGNS:  See vs page GENERAL: no distress Pulses: dorsalis pedis intact bilat.   MSK: no deformity of the feet CV: no leg edema Skin:  no ulcer on the feet.  normal color and temp on the feet. Neuro: sensation is intact to touch on the feet.    Lab Results  Component Value Date   HGBA1C 8.5 (A) 06/04/2019       Assessment & Plan:    Insulin-requiring type 2 DM: worse Renal failure: in this setting, he needs a faster-acting qam insulin.  Pattern of cbg's supports this Hypoglycemia: this limits aggressiveness of glycemic control Noncompliance with insulin: he is not a candidate for multiple daily injections  Patient Instructions  I have sent a prescription to your pharmacy, to change the Lantus to NPH, 50 units each morning.  On this type of insulin schedule, you should eat meals on a regular schedule.  If a meal is missed or significantly delayed, your blood sugar could go low.   Please come back for a follow-up appointment in 2 weeks.

## 2019-06-11 ENCOUNTER — Ambulatory Visit: Payer: Medicare Other | Admitting: Internal Medicine

## 2019-06-12 ENCOUNTER — Ambulatory Visit (INDEPENDENT_AMBULATORY_CARE_PROVIDER_SITE_OTHER): Payer: Managed Care, Other (non HMO) | Admitting: Internal Medicine

## 2019-06-12 ENCOUNTER — Other Ambulatory Visit: Payer: Self-pay

## 2019-06-12 ENCOUNTER — Encounter: Payer: Self-pay | Admitting: Internal Medicine

## 2019-06-12 VITALS — BP 142/86 | HR 83 | Temp 98.6°F | Ht 69.0 in | Wt 172.0 lb

## 2019-06-12 DIAGNOSIS — Z0001 Encounter for general adult medical examination with abnormal findings: Secondary | ICD-10-CM

## 2019-06-12 DIAGNOSIS — E785 Hyperlipidemia, unspecified: Secondary | ICD-10-CM

## 2019-06-12 DIAGNOSIS — N186 End stage renal disease: Secondary | ICD-10-CM | POA: Diagnosis not present

## 2019-06-12 DIAGNOSIS — E0821 Diabetes mellitus due to underlying condition with diabetic nephropathy: Secondary | ICD-10-CM | POA: Diagnosis not present

## 2019-06-12 DIAGNOSIS — I5022 Chronic systolic (congestive) heart failure: Secondary | ICD-10-CM

## 2019-06-12 DIAGNOSIS — Z992 Dependence on renal dialysis: Secondary | ICD-10-CM

## 2019-06-12 DIAGNOSIS — Z1152 Encounter for screening for COVID-19: Secondary | ICD-10-CM | POA: Insufficient documentation

## 2019-06-12 DIAGNOSIS — E1169 Type 2 diabetes mellitus with other specified complication: Secondary | ICD-10-CM

## 2019-06-12 HISTORY — DX: Encounter for general adult medical examination with abnormal findings: Z00.01

## 2019-06-12 MED ORDER — NYSTATIN-TRIAMCINOLONE 100000-0.1 UNIT/GM-% EX OINT: 1.0000 "application " | TOPICAL_OINTMENT | Freq: Two times a day (BID) | CUTANEOUS | 3 refills | Status: DC

## 2019-06-12 NOTE — Assessment & Plan Note (Signed)
Stable, nephrology following

## 2019-06-12 NOTE — Patient Instructions (Signed)
We have sent in a cream for the back to use twice a day. This should take care of the rash in 1-2 weeks.

## 2019-06-12 NOTE — Assessment & Plan Note (Signed)
HgA1c stable overall and seeing endocrine for management of insulin.

## 2019-06-12 NOTE — Assessment & Plan Note (Signed)
Taking crestor and recent lipid panel at goal.

## 2019-06-12 NOTE — Assessment & Plan Note (Signed)
Flu shot up to date. Pneumonia up to date. Shingrix counseled. Tetanus up to date. Colonoscopy up to date. Counseled about sun safety and mole surveillance. Counseled about the dangers of distracted driving. Given 10 year screening recommendations.

## 2019-06-12 NOTE — Progress Notes (Signed)
   Subjective:   Patient ID: Brandon Carroll, male    DOB: [birth year], 59 y.o.   MRN: [id number]  HPI The patient is a 59 YO man coming in for physical. Trying to pursue kidney transplant.   PMH, [facility], social history reviewed and updated  Review of Systems  Constitutional: Positive for fatigue.  HENT: Negative.   Eyes: Negative.   Respiratory: Negative for cough, chest tightness and shortness of breath.   Cardiovascular: Negative for chest pain, palpitations and leg swelling.  Gastrointestinal: Negative for abdominal distention, abdominal pain, constipation, diarrhea, nausea and vomiting.  Genitourinary:       ED  Musculoskeletal: Negative.   Skin: Negative.   Neurological: Negative.   Psychiatric/Behavioral: Negative.     Objective:  Physical Exam Constitutional:      Appearance: He is well-developed.  HENT:     Head: Normocephalic and atraumatic.  Cardiovascular:     Rate and Rhythm: Normal rate and regular rhythm.  Pulmonary:     Effort: Pulmonary effort is normal. No respiratory distress.     Breath sounds: Normal breath sounds. No wheezing or rales.  Abdominal:     General: Bowel sounds are normal. There is no distension.     Palpations: Abdomen is soft.     Tenderness: There is no abdominal tenderness. There is no rebound.  Musculoskeletal:     Cervical back: Normal range of motion.  Skin:    General: Skin is warm and dry.  Neurological:     Mental Status: He is alert and oriented to person, place, and time.     Coordination: Coordination normal.     Vitals:   06/12/19 1322  BP: (!) 142/86  Pulse: 83  Temp: 98.6 F (37 C)  TempSrc: Oral  SpO2: 98%  Weight: 172 lb (78 kg)  Height: 5\' 9"  (1.753 m)    This visit occurred during the SARS-CoV-2 public health emergency.  Safety protocols were in place, including screening questions prior to the visit, additional usage of staff PPE, and extensive cleaning of exam room while observing appropriate contact  time as indicated for disinfecting solutions.   Assessment & Plan:

## 2019-06-12 NOTE — Assessment & Plan Note (Signed)
Stable, no flare today. Recent cath in preparation for kidney transplant.

## 2019-06-18 ENCOUNTER — Ambulatory Visit (INDEPENDENT_AMBULATORY_CARE_PROVIDER_SITE_OTHER): Payer: Managed Care, Other (non HMO) | Admitting: Endocrinology

## 2019-06-18 ENCOUNTER — Encounter: Payer: Self-pay | Admitting: Endocrinology

## 2019-06-18 ENCOUNTER — Other Ambulatory Visit: Payer: Self-pay

## 2019-06-18 VITALS — BP 90/50 | HR 85 | Ht 69.0 in | Wt 178.4 lb

## 2019-06-18 DIAGNOSIS — N189 Chronic kidney disease, unspecified: Secondary | ICD-10-CM

## 2019-06-18 DIAGNOSIS — E0821 Diabetes mellitus due to underlying condition with diabetic nephropathy: Secondary | ICD-10-CM

## 2019-06-18 DIAGNOSIS — E1122 Type 2 diabetes mellitus with diabetic chronic kidney disease: Secondary | ICD-10-CM

## 2019-06-18 DIAGNOSIS — Z794 Long term (current) use of insulin: Secondary | ICD-10-CM

## 2019-06-18 MED ORDER — INSULIN LISPRO PROT & LISPRO (75-25 MIX) 100 UNIT/ML KWIKPEN: 50.0000 [IU] | PEN_INJECTOR | Freq: Every day | SUBCUTANEOUS | 11 refills | Status: DC

## 2019-06-18 NOTE — Patient Instructions (Addendum)
I have sent a prescription to your pharmacy, to change the NPH to "75/25," 50 units each morning. On this type of insulin schedule, you should eat meals on a regular schedule.  If a meal is missed or significantly delayed, your blood sugar could go low.   Please come back for a follow-up appointment in 2-3 months.

## 2019-06-18 NOTE — Progress Notes (Signed)
Subjective:    Patient ID: Brandon Carroll, male    DOB: Nov 23, 1960, 59 y.o.   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   Dx'ed: 99991111 Complications: renal failure (no HD yet).   Therapy: insulin since 2014.   DKA: never.  Severe hypoglycemia: once (early 2020) Pancreatitis: never.  SDOH: he is on QD insulin, due to h/o noncompliance Other: He works 1st shift, as a courier; fructosamine converts to slightly higher A1c than cbg's and A1c itself; he changed Lantus to NPH, due to pattern of cbg's.   Interval history: I reviewed continuous glucose monitor data. Glucose varies from 75-330.  It is in general highest at 1 PM.  pt states he feels well in general.   Pt says he never misses the insulin.  He has approx 8 more NPH pens to use up.  Past Medical History:  Diagnosis Date  . Acute systolic CHF (congestive heart failure), NYHA class 2 ([place]) 05/04/2015  . Acute-on-chronic kidney injury ([place]) 05/04/2015  . Diabetes mellitus   . Erectile dysfunction   . Hyperlipidemia   . Hypertension     Past Surgical History:  Procedure Laterality Date  . AV FISTULA PLACEMENT Left 05/26/2018   Procedure: ARTERIOVENOUS (AV) FISTULA CREATION LEFT ARM;  Surgeon: Marty Heck, MD;  Location: [facility];  Service: Vascular;  Laterality: Left;  . CARDIAC CATHETERIZATION N/A 06/15/2015   Procedure: Left Heart Cath and Coronary Angiography;  Surgeon: Peter M Martinique, MD;  Location: [facility];  Service: Cardiovascular;  Laterality: N/A;  . CHOLECYSTECTOMY N/A 06/28/2017   Procedure: LAPAROSCOPIC CHOLECYSTECTOMY;  Surgeon: Ralene Ok, MD;  [facility];  Service: General;  Laterality: N/A;  . HERNIA REPAIR  7th grade   umbilical  . LEFT HEART CATH AND CORONARY ANGIOGRAPHY N/A 04/28/2019   Procedure: LEFT HEART CATH AND CORONARY ANGIOGRAPHY;  Surgeon: Leonie Man, MD;  Location: [facility];  Service: Cardiovascular;  Laterality: N/A;    Social History   Socioeconomic History  . Marital status: Divorced    Spouse name: n/a  . Number of children: 1  . Years of education: 12+  . Highest education level: Not on file  Occupational History  . Occupation: Building services engineer: [employer]  Tobacco Use  . Smoking status: Never Smoker  . Smokeless tobacco: Never Used  Substance and Sexual Activity  . Alcohol use: No    Alcohol/week: 0.0 standard drinks  . Drug use: No  . Sexual activity: Not Currently    Comment: Erectile dysfunciotn  Other Topics Concern  . Not on file  Social History Narrative   Divorced. Education: [employer].    Unable to exercise due to significant SOB and DOE.    Lives alone.   Son lives in [place].   Social Determinants of Health   Financial Resource Strain:   . Difficulty of Paying Living Expenses: Not on file  Food Insecurity:   . Worried About Charity fundraiser in the Last Year: Not on file  . Ran Out of Food in the Last Year: Not on file  Transportation Needs:   . Lack of Transportation (Medical): Not on file  . Lack of Transportation (Non-Medical): Not on file  Physical Activity:   . Days of Exercise per Week: Not on file  . Minutes of Exercise per Session: Not on file  Stress:   . Feeling of Stress : Not on file  Social Connections:   . Frequency of Communication with Friends and Family: Not on file  . Frequency of Social Gatherings with Friends and Family: Not on file  . Attends Religious Services: Not on file  . Active Member of Clubs or Organizations: Not on file  . Attends Archivist Meetings: Not on file  . Marital Status: Not on file  Intimate Partner Violence:   . Fear of Current or Ex-Partner: Not on file  . Emotionally Abused: Not on file  . Physically Abused: Not on file  . Sexually Abused: Not on file    Current Outpatient Medications on File Prior to Visit  Medication Sig Dispense  Refill  . aspirin EC 81 MG tablet Take 81 mg by mouth daily.    . carvedilol (COREG) 6.25 MG tablet TAKE 1 TABLET(6.25 MG) BY MOUTH TWICE DAILY (Patient taking differently: Take 6.25 mg by mouth 2 (two) times daily with a meal. ) 180 tablet 1  . Continuous Blood Gluc Receiver (FREESTYLE LIBRE 14 DAY READER) DEVI USE AS DIRECTED 6 Device 3  . Continuous Blood Gluc Sensor (FREESTYLE LIBRE 14 DAY SENSOR) MISC 1 each by Does not apply route every 14 (fourteen) days. For use with continuous glucose monitoring system. Change every 14 days 2 each 11  . glucose blood (ONETOUCH VERIO) test strip 1 each by Other route 2 (two) times daily as needed for other. And lancets 2/day 100 each 12  . hydrALAZINE (APRESOLINE) 25 MG tablet Take 1 tablet (25 mg total) by mouth 3 (three) times daily. 270 tablet 3  . Insulin Pen Needle (BD PEN NEEDLE NANO U/F) 32G X 4 MM MISC 1 each by Other route daily. E11.9 100 each 0  . isosorbide mononitrate (IMDUR) 60 MG 24 hr tablet TAKE 1 TABLET(60 MG) BY MOUTH DAILY 90 tablet 0  . nystatin-triamcinolone ointment (MYCOLOG) Apply 1 application topically 2 (two) times daily. 100 g 3  . rosuvastatin (CRESTOR) 20 MG tablet Take 2 tablets (40 mg total) by mouth daily. 90 tablet 3   No current facility-administered medications on file prior to visit.    Allergies  Allergen Reactions  . Lipitor [Atorvastatin] Hives and Itching    Family History  Problem Relation Age of Onset  . Arthritis Mother   . COPD Mother   . Diabetes Mother        was thin, took insulin  . Hypertension Mother   . Hypertension Father   . Arthritis Sister        rheumatoid  . COPD Sister   . Diabetes Brother   . Colon cancer Neg Hx     BP (!) 90/50 (BP Location: Right Arm, Patient Position: Sitting, Cuff Size: Normal)   Pulse 85   Ht 5\' 9"  (1.753 m)   Wt 178 lb 6.4 oz (80.9 kg)   SpO2 93%   BMI 26.35 kg/m    Review of Systems She denies hypoglycemia.      Objective:   Physical Exam VITAL  SIGNS:  See vs page GENERAL: no distress Pulses: dorsalis pedis intact bilat.   MSK: no deformity of the feet CV: no leg edema Skin:  no ulcer on the feet.  normal color and temp on the feet. Neuro: sensation is intact to touch on the feet.    Lab Results  Component Value Date   CREATININE 6.52 (H) 04/23/2019   BUN 35 (H) 04/23/2019  NA 135 04/23/2019   K 4.4 04/23/2019   CL 91 (L) 04/23/2019   CO2 26 04/23/2019   Lab Results  Component Value Date   HGBA1C 8.5 (A) 06/04/2019       Assessment & Plan:  Insulin-requiring type 2 DM: worse Renal failure: he needs a faster-acting qam insulin SDOH: he is on QD insulin, due to h/o noncompliance.   Patient Instructions  I have sent a prescription to your pharmacy, to change the NPH to "75/25," 50 units each morning. On this type of insulin schedule, you should eat meals on a regular schedule.  If a meal is missed or significantly delayed, your blood sugar could go low.   Please come back for a follow-up appointment in 2-3 months.

## 2019-06-20 DIAGNOSIS — E119 Type 2 diabetes mellitus without complications: Secondary | ICD-10-CM | POA: Insufficient documentation

## 2019-06-20 HISTORY — DX: Type 2 diabetes mellitus without complications: E11.9

## 2019-06-30 ENCOUNTER — Other Ambulatory Visit: Payer: Self-pay | Admitting: Cardiology

## 2019-07-04 ENCOUNTER — Other Ambulatory Visit (HOSPITAL_COMMUNITY)
Admission: RE | Admit: 2019-07-04 | Discharge: 2019-07-04 | Disposition: A | Discharge status: Home / Self Care | Payer: Managed Care, Other (non HMO) | Source: Ambulatory Visit | Attending: Pulmonary Disease | Admitting: Pulmonary Disease

## 2019-07-04 DIAGNOSIS — Z20822 Contact with and (suspected) exposure to covid-19: Secondary | ICD-10-CM | POA: Diagnosis not present

## 2019-07-04 DIAGNOSIS — Z01812 Encounter for preprocedural laboratory examination: Secondary | ICD-10-CM | POA: Diagnosis not present

## 2019-07-04 LAB — SARS CORONAVIRUS 2 (TAT 6-24 HRS): SARS Coronavirus 2: NEGATIVE

## 2019-07-07 ENCOUNTER — Encounter: Payer: Self-pay | Admitting: Pulmonary Disease

## 2019-07-07 ENCOUNTER — Other Ambulatory Visit: Payer: Self-pay

## 2019-07-07 ENCOUNTER — Ambulatory Visit (INDEPENDENT_AMBULATORY_CARE_PROVIDER_SITE_OTHER): Payer: Managed Care, Other (non HMO) | Admitting: Pulmonary Disease

## 2019-07-07 DIAGNOSIS — R9389 Abnormal findings on diagnostic imaging of other specified body structures: Secondary | ICD-10-CM

## 2019-07-07 DIAGNOSIS — D869 Sarcoidosis, unspecified: Secondary | ICD-10-CM | POA: Diagnosis not present

## 2019-07-07 LAB — PULMONARY FUNCTION TEST
DL/VA % pred: 87 %
DL/VA: 3.78 ml/min/mmHg/L
DLCO unc % pred: 63 %
DLCO unc: 17.47 ml/min/mmHg
FEF 25-75 Post: 2.71 L/sec
FEF 25-75 Pre: 2.21 L/sec
FEF2575-%Change-Post: 22 %
FEF2575-%Pred-Post: 90 %
FEF2575-%Pred-Pre: 74 %
FEV1-%Change-Post: 4 %
FEV1-%Pred-Post: 88 %
FEV1-%Pred-Pre: 84 %
FEV1-Post: 2.73 L
FEV1-Pre: 2.61 L
FEV1FVC-%Change-Post: 6 %
FEV1FVC-%Pred-Pre: 98 %
FEV6-%Change-Post: -1 %
FEV6-%Pred-Post: 86 %
FEV6-%Pred-Pre: 88 %
FEV6-Post: 3.3 L
FEV6-Pre: 3.36 L
FEV6FVC-%Change-Post: 0 %
FEV6FVC-%Pred-Post: 103 %
FEV6FVC-%Pred-Pre: 103 %
FVC-%Change-Post: -1 %
FVC-%Pred-Post: 83 %
FVC-%Pred-Pre: 85 %
FVC-Post: 3.3 L
FVC-Pre: 3.36 L
Post FEV1/FVC ratio: 83 %
Post FEV6/FVC ratio: 100 %
Pre FEV1/FVC ratio: 78 %
Pre FEV6/FVC Ratio: 100 %
RV % pred: 26 %
RV: 0.57 L
TLC % pred: 57 %
TLC: 3.92 L

## 2019-07-07 NOTE — Progress Notes (Signed)
PFT done today. 

## 2019-07-07 NOTE — Patient Instructions (Signed)
Lung function is mostly good but diffusion slightly decreased Chest x-ray from December is clear  I do not think sarcoidosis is active

## 2019-07-07 NOTE — Assessment & Plan Note (Signed)
Lung function shows stable to improved FVC at 85% but TLC and DLCO is decreased-not sure why Chest x-ray from December is clear  I do not think sarcoidosis is active. His rash does not seem to be sarcoid related, more likely related to diabetes or tinea versicolor. As such I do not think he needs more prednisone or more testing prior to listing for his transplant. If the transplant team thinks otherwise, he/they will get back to me  I encouraged him to get the Covid vaccine when it is available for him

## 2019-07-07 NOTE — Progress Notes (Signed)
   Subjective:    Patient ID: Brandon Carroll, male    DOB: 1961/03/26, 59 y.o.   MRN: [id number]  HPI  59 yo never smoker for follow-up of sarcoid w/ mediastinal adenopathy &abn CT Chest, never been biopsied, elev ACE level , not on Pred therapy due to poorly controlled DM  He is a former patient of Dr. Lenna Gilford who is now retired PMH-insulin requiring diabetes, ESRD on HD M/W/F since 10/2018, nonischemic cardiomyopathy and hypertension  Reviewed last office visit 03/2019, given low-dose prednisone for persistent cough and skin rash on his back . This really did not help him much, dry cough persists occasionally but does not bother him or wake him up from sleep. Dyspnea is mild and stable. He does not have fluid retention and is compliant with dialysis.  He underwent left heart cath in December which showed multivessel disease. Chest x-ray 04/2019 was personally reviewed which shows clear lungs.  PFTs were reviewed  He still has a rash on his back but has received some ointment from his PCP which seems to be helping    Significant tests/ events reviewed  PFTs 06/2019 -no airway obstruction, FVC 85%, TLC 57% DLCO 17.5/63%  12/2016 CT chest without contrast No substantial interval change compared to 2017 the mediastinal and bilateral hilar lymphadenopathy is similar to prior. Scattered parenchymal and mainly perifissural nodularity also with similar appearance. Dominant 6 mm right upper lobe nodule is unchanged.  05/2018 echo > grade 2 diastolic dysfunction 99991111 VQ scan negative  Spirometry 05/2015 ratio 78, FEV1 79%, FVC 81%  Review of Systems Patient denies significant dyspnea,cough, hemoptysis,  chest pain, palpitations, pedal edema, orthopnea, paroxysmal nocturnal dyspnea, lightheadedness, nausea, vomiting, abdominal or  leg pains      Objective:   Physical Exam   Gen. Pleasant, well-nourished, in no distress ENT - no thrush, no pallor/icterus,no post nasal drip Neck: No  JVD, no thyromegaly, no carotid bruits Lungs: no use of accessory muscles, no dullness to percussion, clear without rales or rhonchi  Cardiovascular: Rhythm regular, heart sounds  normal, no murmurs or gallops, no peripheral edema Musculoskeletal: No deformities, no cyanosis or clubbing , left arm fistula thrill present Skin-deep brown pigmented rash on his back, no scaling, no itching        Assessment & Plan:

## 2019-08-04 ENCOUNTER — Ambulatory Visit (INDEPENDENT_AMBULATORY_CARE_PROVIDER_SITE_OTHER): Payer: Managed Care, Other (non HMO) | Admitting: Cardiology

## 2019-08-04 ENCOUNTER — Encounter: Payer: Self-pay | Admitting: Cardiology

## 2019-08-04 ENCOUNTER — Other Ambulatory Visit: Payer: Self-pay

## 2019-08-04 VITALS — BP 152/94 | HR 77 | Ht 69.0 in | Wt 182.4 lb

## 2019-08-04 DIAGNOSIS — I10 Essential (primary) hypertension: Secondary | ICD-10-CM | POA: Diagnosis not present

## 2019-08-04 DIAGNOSIS — D869 Sarcoidosis, unspecified: Secondary | ICD-10-CM

## 2019-08-04 DIAGNOSIS — Z992 Dependence on renal dialysis: Secondary | ICD-10-CM

## 2019-08-04 DIAGNOSIS — I2510 Atherosclerotic heart disease of native coronary artery without angina pectoris: Secondary | ICD-10-CM | POA: Diagnosis not present

## 2019-08-04 DIAGNOSIS — N186 End stage renal disease: Secondary | ICD-10-CM

## 2019-08-04 DIAGNOSIS — I5022 Chronic systolic (congestive) heart failure: Secondary | ICD-10-CM

## 2019-08-04 NOTE — Progress Notes (Signed)
Cardiology Office Note:    Date:  08/04/2019   ID:  Brandon Carroll, DOB 01-10-1961, MRN [id number]  PCP:  Hoyt Koch, MD  Cardiologist:  Jenne Campus, MD    Referring MD: Hoyt Koch, *   No chief complaint on file. I am doing much better  History of Present Illness:    Brandon Carroll is a 59 y.o. male past medical history significant for chronic kidney failure, congestive heart failure, recent cardiac catheterization showing mild to moderate disease of the ostial circumflex as well as proximal RCA.  No critical lesions were identified for intervening.  Left ventricle ejection fraction was preserved.  Last time when I seen him the problem was blood pressure being low and I cut down his hydralazine.  Also there was some modification of his dialysis setting and he is feeling much better.  Simply today of dialysis is exhausted but there is no dizziness or passing out.  Denies have any chest pain, tightness, pressure, burning in the chest.  No shortness of breath.  Overall seems to be doing well.  Past Medical History:  Diagnosis Date  . Acute systolic CHF (congestive heart failure), NYHA class 2 ([place]) 05/04/2015  . Acute-on-chronic kidney injury ([place]) 05/04/2015  . Diabetes mellitus   . Erectile dysfunction   . Hyperlipidemia   . Hypertension     Past Surgical History:  Procedure Laterality Date  . AV FISTULA PLACEMENT Left 05/26/2018   Procedure: ARTERIOVENOUS (AV) FISTULA CREATION LEFT ARM;  Surgeon: Marty Heck, MD;  Location: [facility];  Service: Vascular;  Laterality: Left;  . CARDIAC CATHETERIZATION N/A 06/15/2015   Procedure: Left Heart Cath and Coronary Angiography;  Surgeon: Peter M Martinique, MD;  Location: [facility];  Service: Cardiovascular;  Laterality: N/A;  . CHOLECYSTECTOMY N/A 06/28/2017   Procedure: LAPAROSCOPIC CHOLECYSTECTOMY;  Surgeon: Ralene Ok, MD;  [facility];  Service: General;  Laterality: N/A;  . HERNIA  REPAIR  7th grade   umbilical  . LEFT HEART CATH AND CORONARY ANGIOGRAPHY N/A 04/28/2019   Procedure: LEFT HEART CATH AND CORONARY ANGIOGRAPHY;  Surgeon: Leonie Man, MD;  Location: [facility];  Service: Cardiovascular;  Laterality: N/A;    Current Medications: No outpatient medications have been marked as taking for the 08/04/19 encounter (Office Visit) with Park Liter, MD.     Allergies:   Lipitor [atorvastatin]   Social History   Socioeconomic History  . Marital status: Divorced    Spouse name: n/a  . Number of children: 1  . Years of education: 12+  . Highest education level: Not on file  Occupational History  . Occupation: Building services engineer: [employer]  Tobacco Use  . Smoking status: Never Smoker  . Smokeless tobacco: Never Used  Substance and Sexual Activity  . Alcohol use: No    Alcohol/week: 0.0 standard drinks  . Drug use: No  . Sexual activity: Not Currently    Comment: Erectile dysfunciotn  Other Topics Concern  . Not on file  Social History Narrative   Divorced. Education: [employer].    Unable to exercise due to significant SOB and DOE.    Lives alone.   Son lives in [place].   Social Determinants of Health   Financial Resource Strain:   . Difficulty of Paying Living Expenses:   Food Insecurity:   . Worried About Charity fundraiser in the Last Year:   . [place] in the  Last Year:   Transportation Needs:   . Film/video editor (Medical):   Marland Kitchen Lack of Transportation (Non-Medical):   Physical Activity:   . Days of Exercise per Week:   . Minutes of Exercise per Session:   Stress:   . Feeling of Stress :   Social Connections:   . Frequency of Communication with Friends and Family:   . Frequency of Social Gatherings with Friends and Family:   . Attends Religious Services:   . Active Member of Clubs or Organizations:   . Attends Archivist Meetings:   Marland Kitchen Marital Status:      Family History: The  patient's family history includes Arthritis in his mother and sister; COPD in his mother and sister; Diabetes in his brother and mother; Hypertension in his father and mother. There is no history of Colon cancer. ROS:   Please see the history of present illness.    All 14 point review of systems negative except as described per history of present illness  EKGs/Labs/Other Studies Reviewed:      Recent Labs: 04/23/2019: BUN 35; Creatinine, Ser 6.52; Hemoglobin 11.8; Platelets 246; Potassium 4.4; Sodium 135  Recent Lipid Panel    Component Value Date/Time   CHOL 197 09/24/2017 0823   CHOL 305 (H) 03/21/2017 1124   TRIG 245.0 (H) 09/24/2017 0823   HDL 45.60 09/24/2017 0823   HDL 60 03/21/2017 1124   CHOLHDL 4 09/24/2017 0823   VLDL 49.0 (H) 09/24/2017 0823   LDLCALC 212 (H) 03/21/2017 1124   LDLDIRECT 106.0 09/24/2017 0823    Physical Exam:    VS:  BP (!) 152/94   Pulse 77   Ht 5\' 9"  (1.753 m)   Wt 182 lb 6.4 oz (82.7 kg)   SpO2 97%   BMI 26.94 kg/m     Wt Readings from Last 3 Encounters:  08/04/19 182 lb 6.4 oz (82.7 kg)  07/07/19 180 lb 9.6 oz (81.9 kg)  06/18/19 178 lb 6.4 oz (80.9 kg)     GEN:  Well nourished, well developed in no acute distress HEENT: Normal NECK: No JVD; No carotid bruits LYMPHATICS: No lymphadenopathy CARDIAC: RRR, no murmurs, no rubs, no gallops RESPIRATORY:  Clear to auscultation without rales, wheezing or rhonchi  ABDOMEN: Soft, non-tender, non-distended MUSCULOSKELETAL:  No edema; No deformity still.  I did review K PN however last data I have is from 2019.  Will contact primary care physician to get his fasting lipid profile SKIN: Warm and dry LOWER EXTREMITIES: no swelling NEUROLOGIC:  Alert and oriented x 3 PSYCHIATRIC:  Normal affect   ASSESSMENT:    1. Coronary artery disease involving native coronary artery of native heart without angina pectoris   2. Essential hypertension   3. Chronic systolic CHF (congestive heart failure)  ([place])   4. ESRD (end stage renal disease) on dialysis ([place])   5. Sarcoidosis    PLAN:    In order of problems listed above:  1. Coronary artery disease stable on appropriate medication which I will continue.  Cardiac catheterization reviewed with the patient. 2. Essential hypertension seems to be uncontrolled but he admits that he did not take his medications today.  Of course I stressed importance of need to take this medication on a regular basis.  And he will continue. 3. Chronic congestive heart failure last echocardiogram showed some segmental motion abnormality however cardiac catheterization did not confirm that.  He is on Imdur as well as Apresoline which I will continue for  now.  He is also on Coreg which is appropriate medication. 4. Dyslipidemia: He is on Crestor, I will continue with that for now I did review his K PN however the last number I have his LDL from 2019 which was 106.  Will call primary care physician to get fasting lipid profile. 5. Chronic kidney failure on dialysis.  Awaiting kidney transplant.   Medication Adjustments/Labs and Tests Ordered: Current medicines are reviewed at length with the patient today.  Concerns regarding medicines are outlined above.  No orders of the defined types were placed in this encounter.  Medication changes: No orders of the defined types were placed in this encounter.   Signed, Park Liter, MD, [facility] 08/04/2019 2:25 PM    [place]

## 2019-08-04 NOTE — Patient Instructions (Signed)
Medication Instructions:  Your physician recommends that you continue on your current medications as directed. Please refer to the Current Medication list given to you today.  *If you need a refill on your cardiac medications before your next appointment, please call your pharmacy*   Lab Work: None ordered  If you have labs (blood work) drawn today and your tests are completely normal, you will receive your results only by: Marland Kitchen MyChart Message (if you have MyChart) OR . A paper copy in the mail If you have any lab test that is abnormal or we need to change your treatment, we will call you to review the results.   Testing/Procedures: None ordered   Follow-Up: At [facility], you and your health needs are our priority.  As part of our continuing mission to provide you with exceptional heart care, we have created designated Provider Care Teams.  These Care Teams include your primary Cardiologist (physician) and Advanced Practice Providers (APPs -  Physician Assistants and Nurse Practitioners) who all work together to provide you with the care you need, when you need it.  We recommend signing up for the patient portal called "MyChart".  Sign up information is provided on this After Visit Summary.  MyChart is used to connect with patients for Virtual Visits (Telemedicine).  Patients are able to view lab/test results, encounter notes, upcoming appointments, etc.  Non-urgent messages can be sent to your provider as well.   To learn more about what you can do with MyChart, go to [email or web address].    Your next appointment:   5 month(s)  The format for your next appointment:   In Person  Provider:   Jenne Campus, MD   Other Instructions

## 2019-08-20 ENCOUNTER — Ambulatory Visit (INDEPENDENT_AMBULATORY_CARE_PROVIDER_SITE_OTHER): Payer: Managed Care, Other (non HMO) | Admitting: Endocrinology

## 2019-08-20 ENCOUNTER — Encounter: Payer: Self-pay | Admitting: Endocrinology

## 2019-08-20 ENCOUNTER — Other Ambulatory Visit: Payer: Self-pay

## 2019-08-20 VITALS — BP 120/70 | HR 86 | Ht 69.0 in | Wt 182.6 lb

## 2019-08-20 DIAGNOSIS — N186 End stage renal disease: Secondary | ICD-10-CM | POA: Diagnosis not present

## 2019-08-20 DIAGNOSIS — E1122 Type 2 diabetes mellitus with diabetic chronic kidney disease: Secondary | ICD-10-CM | POA: Diagnosis not present

## 2019-08-20 DIAGNOSIS — Z794 Long term (current) use of insulin: Secondary | ICD-10-CM

## 2019-08-20 DIAGNOSIS — E0821 Diabetes mellitus due to underlying condition with diabetic nephropathy: Secondary | ICD-10-CM

## 2019-08-20 LAB — POCT GLYCOSYLATED HEMOGLOBIN (HGB A1C): Hemoglobin A1C: 7.4 % — AB (ref 4.0–5.6)

## 2019-08-20 NOTE — Patient Instructions (Signed)
Please continue the same insulin for now Please come back for a follow-up appointment in 2 months. Then, we'll probably need to change the 75/25 insulin to 50/50.

## 2019-08-20 NOTE — Progress Notes (Signed)
Subjective:    Patient ID: Brandon Carroll, male    DOB: 1960/09/17, 59 y.o.   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   Dx'ed: 7564 Complications: renal failure (on HD).   Therapy: insulin since 2014.   DKA: never.  Severe hypoglycemia: once (early 2020).  Pancreatitis: never.  SDOH: he is on QD insulin, due to h/o noncompliance Other: He works 1st shift, as a courier; fructosamine converts to slightly higher A1c than cbg's and A1c itself; he changed Lantus to NPH, due to pattern of cbg's.   Interval history: continuous glucose monitor is not functioning now. Pt says glucose varies from 50-200.  It is in general higher as the day goes on.  Pt says he never misses the insulin.  He has several months' of 75/25 he wants to use up.   Past Medical History:  Diagnosis Date  . Acute systolic CHF (congestive heart failure), NYHA class 2 ([place]) 05/04/2015  . Acute-on-chronic kidney injury ([place]) 05/04/2015  . Diabetes mellitus   . Erectile dysfunction   . Hyperlipidemia   . Hypertension     Past Surgical History:  Procedure Laterality Date  . AV FISTULA PLACEMENT Left 05/26/2018   Procedure: ARTERIOVENOUS (AV) FISTULA CREATION LEFT ARM;  Surgeon: Marty Heck, MD;  Location: [facility];  Service: Vascular;  Laterality: Left;  . CARDIAC CATHETERIZATION N/A 06/15/2015   Procedure: Left Heart Cath and Coronary Angiography;  Surgeon: Peter M Martinique, MD;  Location: [facility];  Service: Cardiovascular;  Laterality: N/A;  . CHOLECYSTECTOMY N/A 06/28/2017   Procedure: LAPAROSCOPIC CHOLECYSTECTOMY;  Surgeon: Ralene Ok, MD;  [facility];  Service: General;  Laterality: N/A;  . HERNIA REPAIR  7th grade   umbilical  . LEFT HEART CATH AND CORONARY ANGIOGRAPHY N/A 04/28/2019   Procedure: LEFT HEART CATH AND CORONARY ANGIOGRAPHY;  Surgeon: Leonie Man, MD;  Location: [facility];  Service: Cardiovascular;  Laterality: N/A;    Social History   Socioeconomic History  . Marital status: Divorced    Spouse name: n/a  . Number of children: 1  . Years of education: 12+  . Highest education level: Not on file  Occupational History  . Occupation: Building services engineer: [employer]  Tobacco Use  . Smoking status: Never Smoker  . Smokeless tobacco: Never Used  Substance and Sexual Activity  . Alcohol use: No    Alcohol/week: 0.0 standard drinks  . Drug use: No  . Sexual activity: Not Currently    Comment: Erectile dysfunciotn  Other Topics Concern  . Not on file  Social History Narrative   Divorced. Education: [employer].    Unable to exercise due to significant SOB and DOE.    Lives alone.   Son lives in [place].   Social Determinants of Health   Financial Resource Strain:   . Difficulty of Paying Living Expenses:   Food Insecurity:   . Worried About Charity fundraiser in the Last Year:   . Arboriculturist in the Last Year:   Transportation Needs:   . Film/video editor (Medical):   Marland Kitchen Lack of Transportation (Non-Medical):   Physical Activity:   . Days of Exercise per Week:   . Minutes of Exercise per Session:   Stress:   . Feeling of Stress :   Social Connections:   . Frequency  of Communication with Friends and Family:   . Frequency of Social Gatherings with Friends and Family:   . Attends Religious Services:   . Active Member of Clubs or Organizations:   . Attends Archivist Meetings:   Marland Kitchen Marital Status:   Intimate Partner Violence:   . Fear of Current or Ex-Partner:   . Emotionally Abused:   Marland Kitchen Physically Abused:   . Sexually Abused:     Current Outpatient Medications on File Prior to Visit  Medication Sig Dispense Refill  . aspirin EC 81 MG tablet Take 81 mg by mouth daily.    . carvedilol (COREG) 6.25 MG tablet Take 1 tablet (6.25 mg total) by mouth 2 (two) times daily with a meal. 180 tablet 0  . Continuous  Blood Gluc Receiver (FREESTYLE LIBRE 14 DAY READER) DEVI USE AS DIRECTED 6 Device 3  . Continuous Blood Gluc Sensor (FREESTYLE LIBRE 14 DAY SENSOR) MISC 1 each by Does not apply route every 14 (fourteen) days. For use with continuous glucose monitoring system. Change every 14 days 2 each 11  . glucose blood (ONETOUCH VERIO) test strip 1 each by Other route 2 (two) times daily as needed for other. And lancets 2/day 100 each 12  . Insulin Lispro Prot & Lispro (HUMALOG MIX 75/25 KWIKPEN) (75-25) 100 UNIT/ML Kwikpen Inject 50 Units into the skin daily with breakfast. And pen needles 1/day 10 pen 11  . Insulin Pen Needle (BD PEN NEEDLE NANO U/F) 32G X 4 MM MISC 1 each by Other route daily. E11.9 100 each 0  . isosorbide mononitrate (IMDUR) 60 MG 24 hr tablet TAKE 1 TABLET(60 MG) BY MOUTH DAILY 90 tablet 0  . nystatin-triamcinolone ointment (MYCOLOG) Apply 1 application topically 2 (two) times daily. 100 g 3  . rosuvastatin (CRESTOR) 20 MG tablet Take 2 tablets (40 mg total) by mouth daily. 90 tablet 3  . hydrALAZINE (APRESOLINE) 25 MG tablet Take 1 tablet (25 mg total) by mouth 3 (three) times daily. 270 tablet 3   No current facility-administered medications on file prior to visit.    Allergies  Allergen Reactions  . Lipitor [Atorvastatin] Hives and Itching    Family History  Problem Relation Age of Onset  . Arthritis Mother   . COPD Mother   . Diabetes Mother        was thin, took insulin  . Hypertension Mother   . Hypertension Father   . Arthritis Sister        rheumatoid  . COPD Sister   . Diabetes Brother   . Colon cancer Neg Hx     BP 120/70   Pulse 86   Ht 5\' 9"  (1.753 m)   Wt 182 lb 9.6 oz (82.8 kg)   SpO2 98%   BMI 26.97 kg/m   Review of Systems Denies LOC.     Objective:   Physical Exam VITAL SIGNS:  See vs page GENERAL: no distress Pulses: dorsalis pedis intact bilat.   MSK: no deformity of the feet CV: no leg edema Skin:  no ulcer on the feet.  normal color  and temp on the feet.  Neuro: sensation is intact to touch on the feet.    Lab Results  Component Value Date   HGBA1C 7.4 (A) 08/20/2019   Lab Results  Component Value Date   CREATININE 6.52 (H) 04/23/2019   BUN 35 (H) 04/23/2019   NA 135 04/23/2019   K 4.4 04/23/2019   CL 91 (L) 04/23/2019  CO2 26 04/23/2019       Assessment & Plan:  Insulin-requiring type 2 DM: this is the best control this pt should aim for, given this regimen, which does match insulin to his changing needs throughout the day ESRD: in this setting, he needs a faster-acting qam insulin.  Hypoglycemia: this also limits aggressiveness of glycemic control.     Patient Instructions  Please continue the same insulin for now Please come back for a follow-up appointment in 2 months. Then, we'll probably need to change the 75/25 insulin to 50/50.  '

## 2019-09-01 ENCOUNTER — Encounter: Payer: Self-pay | Admitting: Podiatry

## 2019-09-01 ENCOUNTER — Ambulatory Visit (INDEPENDENT_AMBULATORY_CARE_PROVIDER_SITE_OTHER): Payer: Managed Care, Other (non HMO)

## 2019-09-01 ENCOUNTER — Other Ambulatory Visit: Payer: Self-pay

## 2019-09-01 ENCOUNTER — Ambulatory Visit (INDEPENDENT_AMBULATORY_CARE_PROVIDER_SITE_OTHER): Payer: Managed Care, Other (non HMO) | Admitting: Podiatry

## 2019-09-01 VITALS — BP 147/80 | HR 87 | Temp 97.7°F

## 2019-09-01 DIAGNOSIS — Q828 Other specified congenital malformations of skin: Secondary | ICD-10-CM | POA: Diagnosis not present

## 2019-09-01 DIAGNOSIS — E1142 Type 2 diabetes mellitus with diabetic polyneuropathy: Secondary | ICD-10-CM | POA: Diagnosis not present

## 2019-09-01 DIAGNOSIS — B351 Tinea unguium: Secondary | ICD-10-CM | POA: Diagnosis not present

## 2019-09-01 DIAGNOSIS — I739 Peripheral vascular disease, unspecified: Secondary | ICD-10-CM

## 2019-09-01 DIAGNOSIS — M79676 Pain in unspecified toe(s): Secondary | ICD-10-CM | POA: Diagnosis not present

## 2019-09-01 DIAGNOSIS — I2510 Atherosclerotic heart disease of native coronary artery without angina pectoris: Secondary | ICD-10-CM | POA: Diagnosis not present

## 2019-09-01 NOTE — Progress Notes (Signed)
Subjective:  Patient ID: Brandon Carroll, male    DOB: [birth year],  MRN: [id number] HPI Chief Complaint  Patient presents with  . Diabetes    Foot Exam - patient is diabetic, last a1c was 7.1, noticed 5th toe right was darkened and was concerned, "it never hurt though", wanted just to get feet checked  . New Patient (Initial Visit)    59 y.o. male presents with the above complaint.   ROS: Denies fever chills nausea vomiting muscle aches pains calf pain back pain chest pain shortness of breath.  Past Medical History:  Diagnosis Date  . Acute systolic CHF (congestive heart failure), NYHA class 2 ([place]) 05/04/2015  . Acute-on-chronic kidney injury ([place]) 05/04/2015  . Diabetes mellitus   . Erectile dysfunction   . Hyperlipidemia   . Hypertension    Past Surgical History:  Procedure Laterality Date  . AV FISTULA PLACEMENT Left 05/26/2018   Procedure: ARTERIOVENOUS (AV) FISTULA CREATION LEFT ARM;  Surgeon: Marty Heck, MD;  Location: [facility];  Service: Vascular;  Laterality: Left;  . CARDIAC CATHETERIZATION N/A 06/15/2015   Procedure: Left Heart Cath and Coronary Angiography;  Surgeon: Peter M Martinique, MD;  Location: [facility];  Service: Cardiovascular;  Laterality: N/A;  . CHOLECYSTECTOMY N/A 06/28/2017   Procedure: LAPAROSCOPIC CHOLECYSTECTOMY;  Surgeon: Ralene Ok, MD;  [facility];  Service: General;  Laterality: N/A;  . HERNIA REPAIR  7th grade   umbilical  . LEFT HEART CATH AND CORONARY ANGIOGRAPHY N/A 04/28/2019   Procedure: LEFT HEART CATH AND CORONARY ANGIOGRAPHY;  Surgeon: Leonie Man, MD;  Location: [facility];  Service: Cardiovascular;  Laterality: N/A;    Current Outpatient Medications:  .  aspirin EC 81 MG tablet, Take 81 mg by mouth daily., Disp: , Rfl:  .  carvedilol (COREG) 6.25 MG tablet, Take 1 tablet (6.25 mg total) by mouth 2 (two) times daily with a meal., Disp: 180 tablet, Rfl: 0 .  Continuous Blood Gluc Receiver  (FREESTYLE LIBRE 14 DAY READER) DEVI, USE AS DIRECTED, Disp: 6 Device, Rfl: 3 .  Continuous Blood Gluc Sensor (FREESTYLE LIBRE 14 DAY SENSOR) MISC, 1 each by Does not apply route every 14 (fourteen) days. For use with continuous glucose monitoring system. Change every 14 days, Disp: 2 each, Rfl: 11 .  glucose blood (ONETOUCH VERIO) test strip, 1 each by Other route 2 (two) times daily as needed for other. And lancets 2/day, Disp: 100 each, Rfl: 12 .  hydrALAZINE (APRESOLINE) 25 MG tablet, Take 1 tablet (25 mg total) by mouth 3 (three) times daily., Disp: 270 tablet, Rfl: 3 .  Insulin Lispro Prot & Lispro (HUMALOG MIX 75/25 KWIKPEN) (75-25) 100 UNIT/ML Kwikpen, Inject 50 Units into the skin daily with breakfast. And pen needles 1/day, Disp: 10 pen, Rfl: 11 .  Insulin Pen Needle (BD PEN NEEDLE NANO U/F) 32G X 4 MM MISC, 1 each by Other route daily. E11.9, Disp: 100 each, Rfl: 0 .  isosorbide mononitrate (IMDUR) 60 MG 24 hr tablet, TAKE 1 TABLET(60 MG) BY MOUTH DAILY, Disp: 90 tablet, Rfl: 0 .  lidocaine-prilocaine (EMLA) cream, APPLY SMALL AMOUNT TO ACCESS SITE (AVF) 1 HOUR BEFORE DIALYSIS. COVER WITH OCCLUSIVE DRESSING (SARAN WRAP), Disp: , Rfl:  .  nystatin-triamcinolone ointment (MYCOLOG), Apply 1 application topically 2 (two) times daily., Disp: 100 g, Rfl: 3 .  rosuvastatin (CRESTOR) 20 MG tablet, Take 2 tablets (40 mg total) by mouth daily., Disp: 90 tablet, Rfl: 3  Allergies  Allergen  Reactions  . Lipitor [Atorvastatin] Hives and Itching   Review of Systems Objective:   Vitals:   09/01/19 0853  BP: (!) 147/80  Pulse: 87  Temp: 97.7 F (36.5 C)    General: Well developed, nourished, in no acute distress, alert and oriented x3   Dermatological: Skin is warm, dry and supple bilateral. Nails x 10 are well maintained; remaining integument appears unremarkable at this time. There are no open sores, no preulcerative lesions, no rash or signs of infection present.  Toenails are long thick  yellow dystrophic and painful.  Reactive hyper keratoma plantar aspect of the foot.  Vascular: Dorsalis Pedis artery and Posterior Tibial artery pedal pulses are 2/4 bilateral with immedate capillary fill time. Pedal hair growth present. No varicosities and no lower extremity edema present bilateral.   Neruologic: Grossly intact via light touch bilateral. Vibratory intact via tuning fork bilateral. Protective threshold with Semmes Wienstein monofilament intact to all pedal sites bilateral. Patellar and Achilles deep tendon reflexes 2+ bilateral. No Babinski or clonus noted bilateral.   Musculoskeletal: No gross boney pedal deformities bilateral. No pain, crepitus, or limitation noted with foot and ankle range of motion bilateral. Muscular strength 5/5 in all groups tested bilateral.  Adductovarus rotated fifth digit of the right foot  Gait: Unassisted, Nonantalgic.    Radiographs:  Radiographs taken today do not demonstrate any type of osseous abnormalities.  Normal osseous architecture bilateral with mild flexible flatfoot deformities no coalitions noted.  Fifth toe right foot does not demonstrate any type of trauma.  Assessment & Plan:   Assessment: Diabetes mellitus without complication and painful toenails and calluses.  Plan: Debrided all reactive hyperkeratotic tissue debrided nails 1 through 5 bilateral.  Follow-up with him on an as-needed basis.       Kanishk Stroebel T. [place], [place]

## 2019-11-03 ENCOUNTER — Encounter: Payer: Self-pay | Admitting: Endocrinology

## 2019-11-03 ENCOUNTER — Other Ambulatory Visit: Payer: Self-pay

## 2019-11-03 ENCOUNTER — Ambulatory Visit (INDEPENDENT_AMBULATORY_CARE_PROVIDER_SITE_OTHER): Payer: Managed Care, Other (non HMO) | Admitting: Endocrinology

## 2019-11-03 VITALS — BP 110/78 | HR 80 | Ht 69.0 in | Wt 184.8 lb

## 2019-11-03 DIAGNOSIS — N186 End stage renal disease: Secondary | ICD-10-CM

## 2019-11-03 DIAGNOSIS — E1122 Type 2 diabetes mellitus with diabetic chronic kidney disease: Secondary | ICD-10-CM | POA: Diagnosis not present

## 2019-11-03 DIAGNOSIS — E0821 Diabetes mellitus due to underlying condition with diabetic nephropathy: Secondary | ICD-10-CM

## 2019-11-03 DIAGNOSIS — I2510 Atherosclerotic heart disease of native coronary artery without angina pectoris: Secondary | ICD-10-CM

## 2019-11-03 LAB — POCT GLYCOSYLATED HEMOGLOBIN (HGB A1C): Hemoglobin A1C: 7.2 % — AB (ref 4.0–5.6)

## 2019-11-03 NOTE — Patient Instructions (Addendum)
Please continue the same insulin for now Please come back for a follow-up appointment in 2 months.

## 2019-11-03 NOTE — Progress Notes (Signed)
Subjective:    Patient ID: Brandon Carroll, male    DOB: 1960-09-23, 59 y.o.   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   Dx'ed: 7989 Complications: renal failure (on HD).   Therapy: insulin since 2014.   DKA: never.  Severe hypoglycemia: once (early 2020).  Pancreatitis: never.  SDOH: he is on QD insulin, due to h/o noncompliance Other: He works 1st shift, as a courier; fructosamine converts to slightly higher A1c than cbg's and A1c itself; he changed Lantus to NPH, then 75/25, due to pattern of cbg's.   Interval history: continuous glucose monitor is downloaded, and scanned into the record.  glucose varies from 100-310.  It is in general higher as the day goes on.  Pt says he never misses the insulin.  He has several months' of 75/25 he wants to use up.   Pt says he has mild hypoglycemia approx once per week.  This usually happens in the afternoon.   Past Medical History:  Diagnosis Date  . Acute systolic CHF (congestive heart failure), NYHA class 2 ([place]) 05/04/2015  . Acute-on-chronic kidney injury ([place]) 05/04/2015  . Diabetes mellitus   . Erectile dysfunction   . Hyperlipidemia   . Hypertension     Past Surgical History:  Procedure Laterality Date  . AV FISTULA PLACEMENT Left 05/26/2018   Procedure: ARTERIOVENOUS (AV) FISTULA CREATION LEFT ARM;  Surgeon: Marty Heck, MD;  Location: [facility];  Service: Vascular;  Laterality: Left;  . CARDIAC CATHETERIZATION N/A 06/15/2015   Procedure: Left Heart Cath and Coronary Angiography;  Surgeon: Peter M Martinique, MD;  Location: [facility];  Service: Cardiovascular;  Laterality: N/A;  . CHOLECYSTECTOMY N/A 06/28/2017   Procedure: LAPAROSCOPIC CHOLECYSTECTOMY;  Surgeon: Ralene Ok, MD;  [facility];  Service: General;  Laterality: N/A;  . HERNIA REPAIR  7th grade   umbilical  . LEFT HEART CATH AND CORONARY ANGIOGRAPHY N/A  04/28/2019   Procedure: LEFT HEART CATH AND CORONARY ANGIOGRAPHY;  Surgeon: Leonie Man, MD;  Location: [facility];  Service: Cardiovascular;  Laterality: N/A;    Social History   Socioeconomic History  . Marital status: Divorced    Spouse name: n/a  . Number of children: 1  . Years of education: 12+  . Highest education level: Not on file  Occupational History  . Occupation: Building services engineer: [employer]  Tobacco Use  . Smoking status: Never Smoker  . Smokeless tobacco: Never Used  Vaping Use  . Vaping Use: Never used  Substance and Sexual Activity  . Alcohol use: No    Alcohol/week: 0.0 standard drinks  . Drug use: No  . Sexual activity: Not Currently    Comment: Erectile dysfunciotn  Other Topics Concern  . Not on file  Social History Narrative   Divorced. Education: [employer].    Unable to exercise due to significant SOB and DOE.    Lives alone.   Son lives in [place].   Social Determinants of Health   Financial Resource Strain:   . Difficulty of Paying Living Expenses:   Food Insecurity:   . Worried About Charity fundraiser in the Last Year:   . Arboriculturist in the Last Year:   Transportation Needs:   . Film/video editor (Medical):   Marland Kitchen Lack of Transportation (Non-Medical):   Physical Activity:   .  Days of Exercise per Week:   . Minutes of Exercise per Session:   Stress:   . Feeling of Stress :   Social Connections:   . Frequency of Communication with Friends and Family:   . Frequency of Social Gatherings with Friends and Family:   . Attends Religious Services:   . Active Member of Clubs or Organizations:   . Attends Archivist Meetings:   Marland Kitchen Marital Status:   Intimate Partner Violence:   . Fear of Current or Ex-Partner:   . Emotionally Abused:   Marland Kitchen Physically Abused:   . Sexually Abused:     Current Outpatient Medications on File Prior to Visit  Medication Sig Dispense Refill  . aspirin EC 81 MG tablet Take  81 mg by mouth daily.    . carvedilol (COREG) 6.25 MG tablet Take 1 tablet (6.25 mg total) by mouth 2 (two) times daily with a meal. 180 tablet 0  . Continuous Blood Gluc Receiver (FREESTYLE LIBRE 14 DAY READER) DEVI USE AS DIRECTED 6 Device 3  . Continuous Blood Gluc Sensor (FREESTYLE LIBRE 14 DAY SENSOR) MISC 1 each by Does not apply route every 14 (fourteen) days. For use with continuous glucose monitoring system. Change every 14 days 2 each 11  . glucose blood (ONETOUCH VERIO) test strip 1 each by Other route 2 (two) times daily as needed for other. And lancets 2/day 100 each 12  . Insulin Lispro Prot & Lispro (HUMALOG MIX 75/25 KWIKPEN) (75-25) 100 UNIT/ML Kwikpen Inject 50 Units into the skin daily with breakfast. And pen needles 1/day 10 pen 11  . Insulin Pen Needle (BD PEN NEEDLE NANO U/F) 32G X 4 MM MISC 1 each by Other route daily. E11.9 100 each 0  . isosorbide mononitrate (IMDUR) 60 MG 24 hr tablet TAKE 1 TABLET(60 MG) BY MOUTH DAILY 90 tablet 0  . lidocaine-prilocaine (EMLA) cream APPLY SMALL AMOUNT TO ACCESS SITE (AVF) 1 HOUR BEFORE DIALYSIS. COVER WITH OCCLUSIVE DRESSING (SARAN WRAP)    . nystatin-triamcinolone ointment (MYCOLOG) Apply 1 application topically 2 (two) times daily. 100 g 3  . rosuvastatin (CRESTOR) 20 MG tablet Take 2 tablets (40 mg total) by mouth daily. 90 tablet 3  . hydrALAZINE (APRESOLINE) 25 MG tablet Take 1 tablet (25 mg total) by mouth 3 (three) times daily. 270 tablet 3   No current facility-administered medications on file prior to visit.    Allergies  Allergen Reactions  . Lipitor [Atorvastatin] Hives and Itching    Family History  Problem Relation Age of Onset  . Arthritis Mother   . COPD Mother   . Diabetes Mother        was thin, took insulin  . Hypertension Mother   . Hypertension Father   . Arthritis Sister        rheumatoid  . COPD Sister   . Diabetes Brother   . Colon cancer Neg Hx     BP 110/78   Pulse 80   Ht 5\' 9"  (1.753 m)    Wt 184 lb 12.8 oz (83.8 kg)   SpO2 94%   BMI 27.29 kg/m   Review of Systems Denies LOC    Objective:   Physical Exam VITAL SIGNS:  See vs page GENERAL: no distress Pulses: dorsalis pedis intact bilat.   MSK: no deformity of the feet CV: no leg edema Skin:  no ulcer on the feet.  normal color and temp on the feet. Neuro: sensation is intact to touch on the  feet.   Lab Results  Component Value Date   HGBA1C 7.2 (A) 11/03/2019       Assessment & Plan:  Insulin-requiring type 2 DM, with ESRD: this is the best control this pt should aim for, given this regimen, which does match insulin to his changing needs throughout the day. Hypoglycemia, due to insulin: we discussed.  We discussed.  He decided to stay with 75/25, at least for now.   Patient Instructions  Please continue the same insulin for now Please come back for a follow-up appointment in 2 months.

## 2019-11-12 ENCOUNTER — Telehealth: Payer: Self-pay | Admitting: Pulmonary Disease

## 2019-11-12 DIAGNOSIS — D869 Sarcoidosis, unspecified: Secondary | ICD-10-CM

## 2019-11-12 MED ORDER — ALBUTEROL SULFATE (2.5 MG/3ML) 0.083% IN NEBU: 2.5000 mg | INHALATION_SOLUTION | Freq: Four times a day (QID) | RESPIRATORY_TRACT | 5 refills | Status: DC | PRN

## 2019-11-12 NOTE — Telephone Encounter (Signed)
Albuterol nebs every 6 hours as needed #30 with 1 refill

## 2019-11-12 NOTE — Telephone Encounter (Signed)
Okay to provide -sarcoidosis

## 2019-11-12 NOTE — Telephone Encounter (Signed)
Nebulizer machine and albuterol ordered.

## 2019-11-12 NOTE — Telephone Encounter (Signed)
Patient requesting a nebulizer machine.  He states that since it is hot outside he is having to stay inside.  He has been coughing a lot, is is a dry cough that delsym is controlling.  The coughing is causing sob.  Dr. Elsworth Soho, Please advise.  Thank you.

## 2019-11-12 NOTE — Telephone Encounter (Signed)
Dr. Elsworth Soho, do you want to order any nebulizer medication with the nebulizer?  Please advise.  Thank you.

## 2019-11-13 IMAGING — DX DG CHEST 2V
2 series · 2 of 2 positions shown · non-contrast
Comparison: Prior chest x-ray 05/22/2018

CLINICAL DATA: 57-year-old male with CHF, shortness of breath for
the past 2 weeks

EXAM:
CHEST - 2 VIEW

[chest pa]
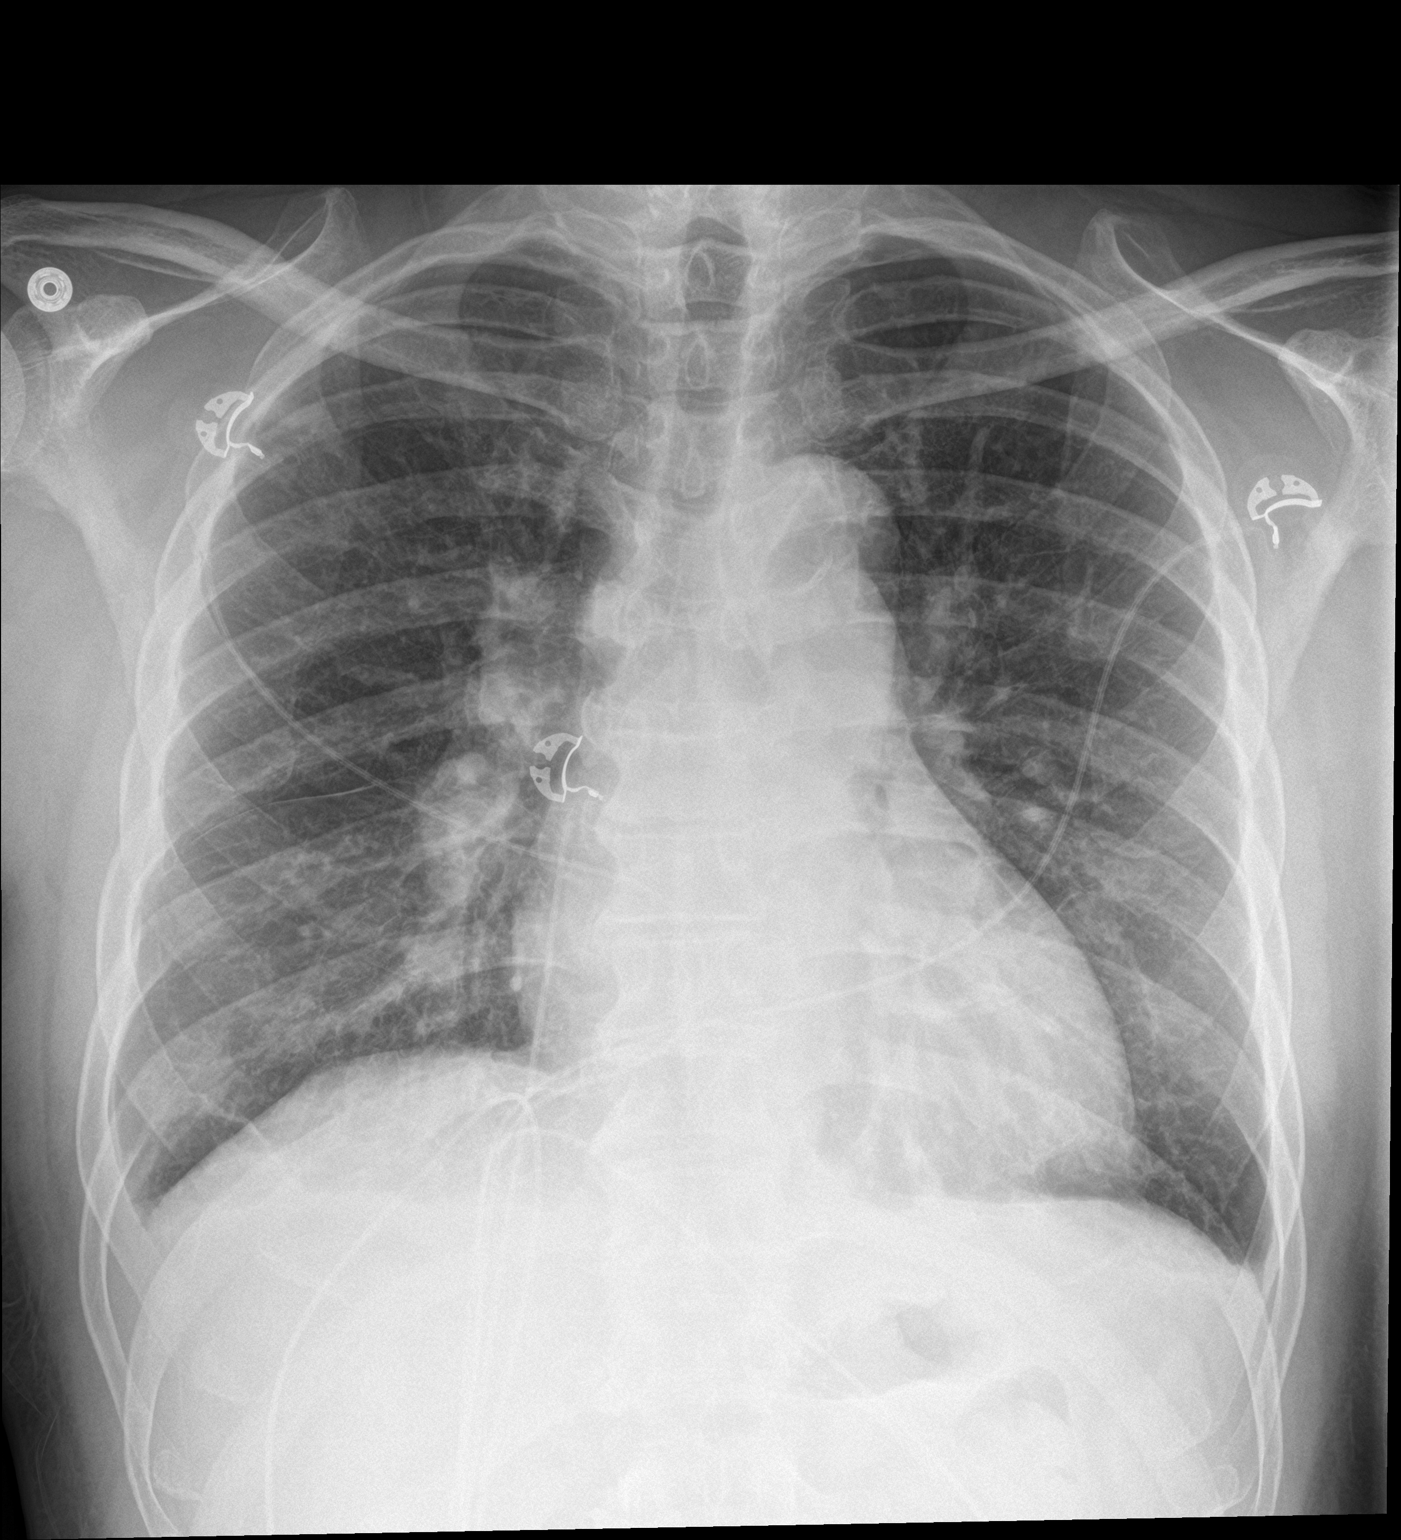

[chest lat]
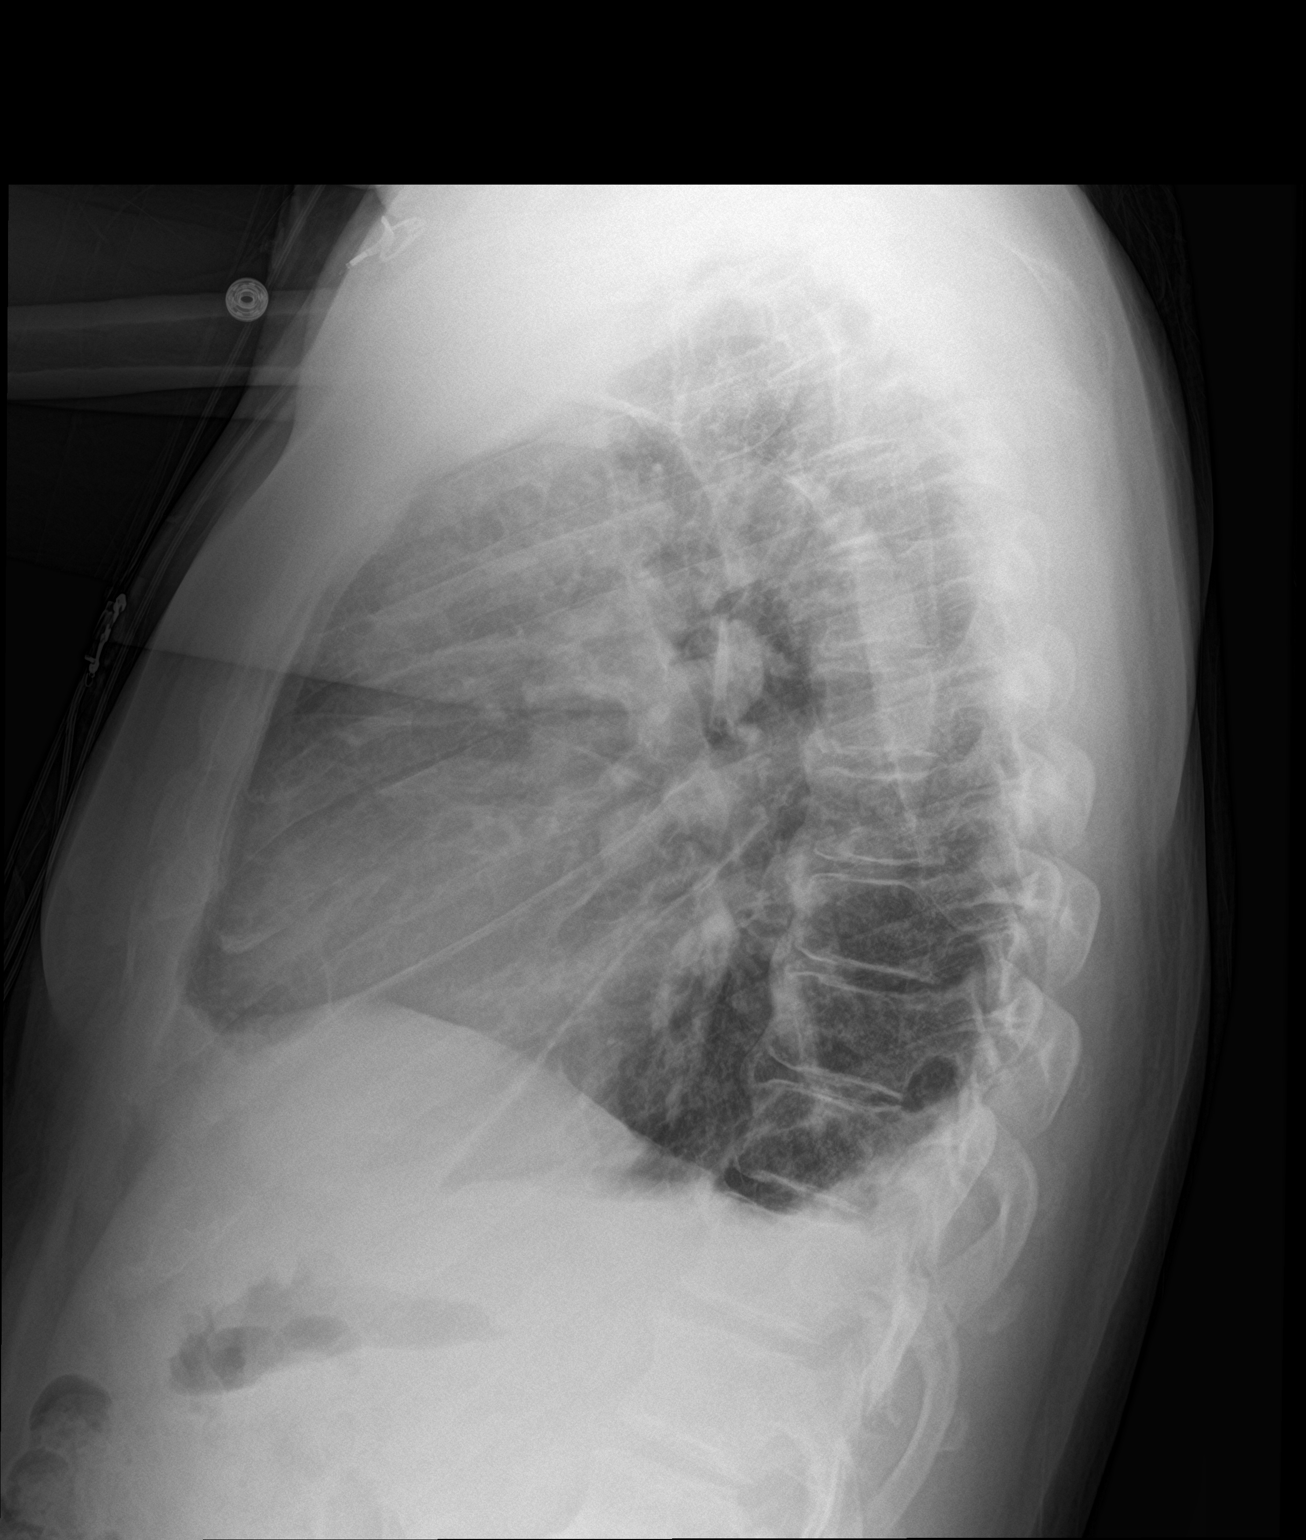

[2 of 2 positions shown; findings below may reference images not displayed]

FINDINGS: Stable cardiomegaly. Atherosclerotic calcifications again noted in
the transverse aorta. Similar degree of pulmonary vascular
congestion without overt pulmonary edema. There are small bilateral
pleural effusions as well as fluid tracking along the major and
minor fissures. Mild dependent atelectasis. No evidence of
pneumothorax or focal airspace consolidation. No acute osseous
abnormality.
IMPRESSION: 1. Cardiomegaly and pulmonary vascular congestion without overt
edema.
2. Small bilateral pleural effusions and bibasilar atelectasis.
3.  Aortic Atherosclerosis ([id number])

## 2019-11-13 NOTE — Telephone Encounter (Signed)
Spoke with pt and advised rx  for albuterol sent to pharmacy and Rx for nebulizer was sent. Nothing further is needed.

## 2019-11-17 ENCOUNTER — Telehealth: Payer: Self-pay | Admitting: Pulmonary Disease

## 2019-11-17 NOTE — Telephone Encounter (Signed)
Called and spoke with pt letting him know the message received from [facility] that he needs an appt prior to them providing him with a nebulizer and pt verbalized understanding. Scheduled pt for an OV with RA Thursday 7/8. Nothing further needed.

## 2019-11-17 NOTE — Telephone Encounter (Signed)
Patient's issue has been resolved. He has an appointment to get his nebulizer and his medication has been ordered. Nothing further needed at this time.

## 2019-11-19 ENCOUNTER — Ambulatory Visit (INDEPENDENT_AMBULATORY_CARE_PROVIDER_SITE_OTHER): Payer: Managed Care, Other (non HMO) | Admitting: Pulmonary Disease

## 2019-11-19 ENCOUNTER — Encounter: Payer: Self-pay | Admitting: Pulmonary Disease

## 2019-11-19 ENCOUNTER — Other Ambulatory Visit: Payer: Self-pay

## 2019-11-19 DIAGNOSIS — I2510 Atherosclerotic heart disease of native coronary artery without angina pectoris: Secondary | ICD-10-CM

## 2019-11-19 DIAGNOSIS — D869 Sarcoidosis, unspecified: Secondary | ICD-10-CM

## 2019-11-19 DIAGNOSIS — I429 Cardiomyopathy, unspecified: Secondary | ICD-10-CM

## 2019-11-19 MED ORDER — PREDNISONE 10 MG PO TABS: ORAL_TABLET | ORAL | 0 refills | Status: DC

## 2019-11-19 MED ORDER — ALBUTEROL SULFATE (2.5 MG/3ML) 0.083% IN NEBU: 2.5000 mg | INHALATION_SOLUTION | Freq: Four times a day (QID) | RESPIRATORY_TRACT | 5 refills | Status: DC | PRN

## 2019-11-19 NOTE — Addendum Note (Signed)
Addended by: Edythe Clarity on: 11/19/2019 10:41 AM   Modules accepted: Orders

## 2019-11-19 NOTE — Progress Notes (Addendum)
   Subjective:    Patient ID: Brandon Carroll, male    DOB: 1960-12-17, 59 y.o.   MRN: [id number]  HPI  59 yo never smoker for follow-up ofsarcoid w/ mediastinal adenopathy &abn CT Chest, never been biopsied, elev ACE level , not on Pred therapy due to poorly controlled DM - former patient of Dr. Lenna Gilford   PMH-insulin requiring diabetes,ESRDon HD M/W/F since 10/2018, nonischemic cardiomyopathy and hypertension LHC 04/2019 showed multivessel non -sig disease.  03/2019, given low-dose prednisone for persistent cough and skin rash on his back >>  did not help him much  Continues to complain of shortness of breath, he used a nebulizer at his brothers place 2 weeks ago and this provided significant benefit hence requests one Not sure if the heat is worsening his breathing  No problems with dialysis, no wheezing. Complains of a dry cough  Significant tests/ events reviewed  PFTs 06/2019 -no airway obstruction, FVC 85%, TLC 57% DLCO 17.5/63%  12/2016 CTchest without contrastNo substantial interval changecompared to 2017 the mediastinal and bilateral hilar lymphadenopathy is similar to prior. Scattered parenchymal and mainly perifissural nodularity also with similar appearance. Dominant 6 mm right upper lobe nodule is unchanged.  05/2018 echo >grade 2 diastolic dysfunction, PA pressure not well estimated 1/2020VQ scan negative  Spirometry 05/2015 ratio 78, FEV1 79%, FVC 81%  Review of Systems Patient denies significant dyspnea,cough, hemoptysis,  chest pain, palpitations, pedal edema, orthopnea, paroxysmal nocturnal dyspnea, lightheadedness, nausea, vomiting, abdominal or  leg pains      Objective:   Physical Exam Gen. 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

## 2019-11-19 NOTE — Assessment & Plan Note (Signed)
Not sure what is causing his shortness of breath, he does not seem to be significantly fluid overloaded, sarcoidosis does not seem to be active on chest x-ray, PFT shows preserved FVC but diffusion is low, no evidence of significant pulmonary hypertension on echo in January although this was suboptimal for PA pressure  We will empirically treat him with a short course of prednisone to see if this has any impact on his dry cough Also provide him with a prescription for nebulizer based on his subjective improvement  If no better, call back and we will schedule CT chest with contrast

## 2019-11-19 NOTE — Assessment & Plan Note (Signed)
He does have diastolic dysfunction grade 2 but no evidence of fluid overload or overt left heart failure

## 2019-11-19 NOTE — Patient Instructions (Signed)
Prescription for nebulizer x1 and albuterol nebs every 6 hours as needed # 30  Prescription for Prednisone 10 mg tabs  Take 2 tabs daily with food x 7ds, then 1 tab daily with food x 7ds then STOP -this may raise your sugar slightly.  If no better, call back and we will schedule CT chest with contrast

## 2019-11-20 ENCOUNTER — Telehealth: Payer: Self-pay | Admitting: Pulmonary Disease

## 2019-11-20 DIAGNOSIS — J449 Chronic obstructive pulmonary disease, unspecified: Secondary | ICD-10-CM

## 2019-11-20 NOTE — Telephone Encounter (Signed)
Dr. Elsworth Soho, please advise a different diagnosis other than sarcoidosis that we may use so pt can be able to receive nebulizer.

## 2019-11-20 NOTE — Telephone Encounter (Signed)
This will have to be given by the nurse or a provider we can only give what is on the order

## 2019-11-20 NOTE — Telephone Encounter (Signed)
[facility] requesting J codes that relate to a respiratory diagnosis for his nebulizer order. Please advise.

## 2019-11-23 ENCOUNTER — Telehealth: Payer: Self-pay

## 2019-11-23 MED ORDER — NOVOLOG MIX 70/30 FLEXPEN (70-30) 100 UNIT/ML ~~LOC~~ SUPN: 50.0000 [IU] | PEN_INJECTOR | Freq: Every day | SUBCUTANEOUS | 11 refills | Status: DC

## 2019-11-23 NOTE — Telephone Encounter (Signed)
Called pt and made him aware of this change. Pt verbalized understanding and did not have any questions at this time.

## 2019-11-23 NOTE — Telephone Encounter (Signed)
Called and spoke with Estill Bamberg from [place] letting her know that RA said we could use COPD for different diagnosis for the neb machine.  She stated that we would need to place a new order and the OV note will also need to be updated with that diagnosis in there as well.  New neb order has been placed. Routing back to Dr. Elsworth Soho so he an update note.

## 2019-11-23 NOTE — Telephone Encounter (Signed)
Insurance does not cover Humalog 75/25 mixed insulin. Would you like to change or complete PA?

## 2019-11-23 NOTE — Telephone Encounter (Signed)
COPD

## 2019-11-23 NOTE — Telephone Encounter (Signed)
done

## 2019-11-23 NOTE — Telephone Encounter (Signed)
I have sent a prescription to your pharmacy, to change to novolog 70/30

## 2019-11-23 NOTE — Telephone Encounter (Signed)
Called [facility] and spoke with Melissa and let her know that the note was amended.

## 2019-12-27 ENCOUNTER — Other Ambulatory Visit: Payer: Self-pay | Admitting: Cardiology

## 2019-12-28 ENCOUNTER — Other Ambulatory Visit: Payer: Self-pay | Admitting: Endocrinology

## 2019-12-28 DIAGNOSIS — E119 Type 2 diabetes mellitus without complications: Secondary | ICD-10-CM

## 2019-12-31 ENCOUNTER — Ambulatory Visit (INDEPENDENT_AMBULATORY_CARE_PROVIDER_SITE_OTHER): Payer: Managed Care, Other (non HMO)

## 2019-12-31 ENCOUNTER — Encounter: Payer: Self-pay | Admitting: Pulmonary Disease

## 2019-12-31 ENCOUNTER — Other Ambulatory Visit: Payer: Self-pay

## 2019-12-31 ENCOUNTER — Ambulatory Visit (INDEPENDENT_AMBULATORY_CARE_PROVIDER_SITE_OTHER): Payer: Managed Care, Other (non HMO) | Admitting: Pulmonary Disease

## 2019-12-31 VITALS — BP 122/76 | HR 68 | Temp 97.6°F | Ht 69.0 in | Wt 185.6 lb

## 2019-12-31 DIAGNOSIS — R0609 Other forms of dyspnea: Secondary | ICD-10-CM

## 2019-12-31 DIAGNOSIS — R0600 Dyspnea, unspecified: Secondary | ICD-10-CM

## 2019-12-31 DIAGNOSIS — D869 Sarcoidosis, unspecified: Secondary | ICD-10-CM

## 2019-12-31 NOTE — Patient Instructions (Addendum)
You were seen today by Lauraine Rinne, NP  for:   1. Sarcoidosis  - DG Chest 2 View; Future  Walk today in office  Based of your chest x-ray you may need to consider a CT of your chest  2. Dyspnea on exertion  Walk today in office  Keep follow-up with cardiology  We recommend today:  Orders Placed This Encounter  Procedures  . DG Chest 2 View    Standing Status:   Future    Standing Expiration Date:   05/01/2020    Order Specific Question:   Reason for Exam (SYMPTOM  OR DIAGNOSIS REQUIRED)    Answer:   sarcoid    Order Specific Question:   Preferred imaging location?    Answer:   Internal    Order Specific Question:   Radiology Contrast Protocol - do NOT remove file path    Answer:   [email or web address]   Orders Placed This Encounter  Procedures  . DG Chest 2 View   No orders of the defined types were placed in this encounter.   Follow Up:    Return in about 2 months (around 03/01/2020), or if symptoms worsen or fail to improve, for Follow up with Dr. Elsworth Soho.   Please do your part to reduce the spread of COVID-19:      Reduce your risk of any infection  and COVID19 by using the similar precautions used for avoiding the common cold or flu:  Marland Kitchen Wash your hands often with soap and warm water for at least 20 seconds.  If soap and water are not readily available, use an alcohol-based hand sanitizer with at least 60% alcohol.  . If coughing or sneezing, cover your mouth and nose by coughing or sneezing into the elbow areas of your shirt or coat, into a tissue or into your sleeve (not your hands). Langley Gauss A MASK when in public  . Avoid shaking hands with others and consider head nods or verbal greetings only. . Avoid touching your eyes, nose, or mouth with unwashed hands.  . Avoid close contact with people who are sick. . Avoid places or events with large numbers of people in one location, like concerts or sporting events. . If you have  some symptoms but not all symptoms, continue to monitor at home and seek medical attention if your symptoms worsen. . If you are having a medical emergency, call 911.   [place] / e-Visit: [email or web address]         [facility]: [facility]: [phone]                   [facility]: [phone]     It is flu season:   >>> Best ways to protect herself from the flu: Receive the yearly flu vaccine, practice good hand hygiene washing with soap and also using hand sanitizer when available, eat a nutritious meals, get adequate rest, hydrate appropriately   Please contact the office if your symptoms worsen or you have concerns that you are not improving.   Thank you for choosing [facility] for your healthcare, and for allowing [place] to partner with you on your healthcare journey. I am thankful to be able to provide care to you today.   Wyn Quaker FNP-C

## 2019-12-31 NOTE — Assessment & Plan Note (Signed)
Presumptive diagnosis of sarcoid Never received biopsy Based off of mediastinal lymphadenopathy, nodularity and elevated ACE level in the past Continued dyspnea despite prednisone taper at last visit  Plan: Walk today in office Chest x-ray today Based off the chest x-ray imaging will need to consider high-resolution CT chest, last CT in 2018 2 to 71-month follow-up with our office

## 2019-12-31 NOTE — Progress Notes (Signed)
@Patient  ID: Brandon Carroll, male    DOB: May 20, 1960, 59 y.o.   MRN: [id number]  Chief Complaint  Patient presents with  . Follow-up    pt states having xray today    Referring provider: Hoyt Koch, *  HPI:  59 year old male never smoker followed in our office for sarcoidosis  PMH: Hyperlipidemia, systolic CHF, sarcoidosis, hypertension, end-stage renal disease on dialysis -Monday Wednesday Friday since June/2020, AV fistula, diabetes Smoker/ Smoking History: Never smoker Maintenance:   Pt of: Dr. Elsworth Soho  12/31/2019  - Visit   59 year old male never smoker followed in our office for sarcoidosis.  Patient is established with Dr. Elsworth Soho.  Patient completing a 6-week follow-up with our office today from Dr. Elsworth Soho.  Last seen in July/2021 plan of care from that office visit was as follows: Prednisone taper was provided, if patient had not improved then we would order a CT of his chest.  Patient presenting back to our office today reporting continued dyspnea. He did not notice any improvement or changes with the prednisone. Last chest x-ray imaging was December 2020. He is wondering if he needs an x-ray today.  Patient is also followed by cardiology Dr. Agustin Cree. He has upcoming follow-up with them on 01/11/20.  Questionaires / Pulmonary Flowsheets:   ACT:  No flowsheet data found.  MMRC: No flowsheet data found.  [facility]:  No flowsheet data found.  Tests:   LHC 04/2019 showed multivessel non -sig disease.  03/2019, given low-dose prednisone for persistent cough and skin rash on his back >>  did not help him much  04/21/2019-chest x-ray-no acute disease, arthrosclerosis  07/07/2019-pulmonary function test-FVC 3.36 (85% predicted), postbronchodilator ratio 83, postbronchodilator FEV1 2.73 (88% predicted), no bronchodilator response, TLC 3.92 (57% predicted), DLCO 17.47 (63% predicted)  FENO:  No results found for: NITRICOXIDE  PFT: PFT Results Latest Ref Rng &  Units 07/07/2019  FVC-Pre L 3.36  FVC-Predicted Pre % 85  FVC-Post L 3.30  FVC-Predicted Post % 83  Pre FEV1/FVC % % 78  Post FEV1/FCV % % 83  FEV1-Pre L 2.61  FEV1-Predicted Pre % 84  FEV1-Post L 2.73  DLCO uncorrected ml/min/mmHg 17.47  DLCO UNC% % 63  DLVA Predicted % 87  TLC L 3.92  TLC % Predicted % 57  RV % Predicted % 26    WALK:  SIX MIN WALK 12/31/2019 06/08/2015  Supplimental Oxygen during Test? (L/min) - No  Tech Comments: pt walkes all three laps staedy pace no drop in stats Pt ambualted at a slow pace with a steady gait.     Imaging: No results found.  Lab Results:  CBC    Component Value Date/Time   WBC 8.6 04/23/2019 0912   WBC 13.2 (H) 05/27/2018 0429   RBC 4.19 04/23/2019 0912   RBC 4.24 05/27/2018 0429   HGB 11.8 (L) 04/23/2019 0912   HCT 35.7 (L) 04/23/2019 0912   PLT 246 04/23/2019 0912   MCV 85 04/23/2019 0912   MCH 28.2 04/23/2019 0912   MCH 25.7 (L) 05/27/2018 0429   MCHC 33.1 04/23/2019 0912   MCHC 33.9 05/27/2018 0429   RDW 14.7 04/23/2019 0912   LYMPHSABS 1.1 05/22/2018 2016   LYMPHSABS 1.1 03/21/2017 1124   MONOABS 1.1 (H) 05/22/2018 2016   EOSABS 0.2 05/22/2018 2016   EOSABS 0.2 03/21/2017 1124   BASOSABS 0.1 05/22/2018 2016   BASOSABS 0.1 03/21/2017 1124    BMET    Component Value Date/Time   NA 135 04/23/2019 0912  K 4.4 04/23/2019 0912   CL 91 (L) 04/23/2019 0912   CO2 26 04/23/2019 0912   GLUCOSE 330 (H) 04/23/2019 0912   GLUCOSE 191 (H) 06/03/2018 1432   BUN 35 (H) 04/23/2019 0912   CREATININE 6.52 (H) 04/23/2019 0912   CREATININE 1.45 (H) 07/26/2015 0919   CALCIUM 9.2 04/23/2019 0912   GFRNONAA 9 (L) 04/23/2019 0912   GFRAA 10 (L) 04/23/2019 0912    BNP    Component Value Date/Time   BNP 1,449.4 (H) 05/24/2018 0411    ProBNP    Component Value Date/Time   PROBNP 1,460.0 (H) 05/20/2018 1709    Specialty Problems      Pulmonary Problems   Dyspnea      Allergies  Allergen Reactions  . Lipitor  [Atorvastatin] Hives and Itching    Immunization History  Administered Date(s) Administered  . Hepatitis B, adult 11/07/2018, 12/05/2018, 01/09/2019, 05/18/2019  . Influenza Inj Mdck Quad Pf 02/16/2019  . Influenza, Seasonal, Injecte, Preservative Fre 07/22/2012  . Influenza,inj,Quad PF,6+ Mos 08/04/2014, 07/02/2016, 03/21/2017  . Moderna SARS-COVID-2 Vaccination 07/24/2019, 08/21/2019  . Pneumococcal Conjugate-13 11/05/2018  . Pneumococcal Polysaccharide-23 07/22/2012, 05/03/2015  . Tdap 05/14/2010    Past Medical History:  Diagnosis Date  . Acute systolic CHF (congestive heart failure), NYHA class 2 ([place]) 05/04/2015  . Acute-on-chronic kidney injury ([place]) 05/04/2015  . Diabetes mellitus   . Erectile dysfunction   . Hyperlipidemia   . Hypertension     Tobacco History: Social History   Tobacco Use  Smoking Status Never Smoker  Smokeless Tobacco Never Used   Counseling given: Yes   Continue to not smoke  Outpatient Encounter Medications as of 12/31/2019  Medication Sig  . albuterol (PROVENTIL) (2.5 MG/3ML) 0.083% nebulizer solution Take 3 mLs (2.5 mg total) by nebulization every 6 (six) hours as needed for wheezing or shortness of breath.  Marland Kitchen aspirin EC 81 MG tablet Take 81 mg by mouth daily.  . BD PEN NEEDLE NANO 2ND GEN 32G X 4 MM MISC USE DAILY WITH INSULIN  . carvedilol (COREG) 6.25 MG tablet Take 1 tablet (6.25 mg total) by mouth 2 (two) times daily with a meal.  . Continuous Blood Gluc Receiver (FREESTYLE LIBRE 14 DAY READER) DEVI USE AS DIRECTED  . Continuous Blood Gluc Sensor (FREESTYLE LIBRE 14 DAY SENSOR) MISC 1 each by Does not apply route every 14 (fourteen) days. For use with continuous glucose monitoring system. Change every 14 days  . glucose blood (ONETOUCH VERIO) test strip 1 each by Other route 2 (two) times daily as needed for other. And lancets 2/day  . insulin aspart protamine - aspart (NOVOLOG MIX 70/30 FLEXPEN) (70-30) 100 UNIT/ML FlexPen Inject 0.5  mLs (50 Units total) into the skin daily with breakfast. And pen needles 1/day  . isosorbide mononitrate (IMDUR) 60 MG 24 hr tablet TAKE 1 TABLET(60 MG) BY MOUTH DAILY  . lidocaine-prilocaine (EMLA) cream APPLY SMALL AMOUNT TO ACCESS SITE (AVF) 1 HOUR BEFORE DIALYSIS. COVER WITH OCCLUSIVE DRESSING (SARAN WRAP)  . nystatin-triamcinolone ointment (MYCOLOG) Apply 1 application topically 2 (two) times daily.  . predniSONE (DELTASONE) 10 MG tablet Take 2 tabs daily for 7 days, then 1 tab daily for 7 days, then stop  . rosuvastatin (CRESTOR) 20 MG tablet Take 2 tablets (40 mg total) by mouth daily.  . hydrALAZINE (APRESOLINE) 25 MG tablet Take 1 tablet (25 mg total) by mouth 3 (three) times daily.   No facility-administered encounter medications on file as of 12/31/2019.  Review of Systems  Review of Systems  Constitutional: Negative for activity change, chills, fatigue, fever and unexpected weight change.  HENT: Negative for postnasal drip, rhinorrhea, sinus pressure, sinus pain and sore throat.   Eyes: Negative.   Respiratory: Positive for shortness of breath. Negative for cough and wheezing.   Cardiovascular: Negative for chest pain and palpitations.  Gastrointestinal: Negative for constipation, diarrhea, nausea and vomiting.  Endocrine: Negative.   Genitourinary: Negative.   Musculoskeletal: Negative.   Skin: Negative.   Neurological: Negative for dizziness and headaches.  Psychiatric/Behavioral: Negative.  Negative for dysphoric mood. The patient is not nervous/anxious.   All other systems reviewed and are negative.    Physical Exam  BP 122/76 (BP Location: Left Arm, Cuff Size: Normal)   Pulse 68   Temp 97.6 F (36.4 C) (Oral)   Ht 5\' 9"  (1.753 m)   Wt 185 lb 9.6 oz (84.2 kg)   SpO2 97%   BMI 27.41 kg/m   Wt Readings from Last 5 Encounters:  12/31/19 185 lb 9.6 oz (84.2 kg)  11/19/19 181 lb 12.8 oz (82.5 kg)  11/03/19 184 lb 12.8 oz (83.8 kg)  08/20/19 182 lb 9.6 oz  (82.8 kg)  08/04/19 182 lb 6.4 oz (82.7 kg)    BMI Readings from Last 5 Encounters:  12/31/19 27.41 kg/m  11/19/19 26.85 kg/m  11/03/19 27.29 kg/m  08/20/19 26.97 kg/m  08/04/19 26.94 kg/m    Physical Exam Vitals and nursing note reviewed.  Constitutional:      General: He is not in acute distress.    Appearance: Normal appearance. He is obese.  HENT:     Head: Normocephalic and atraumatic.     Right Ear: Hearing and external ear normal.     Left Ear: Hearing and external ear normal.     Nose: Nose normal. No mucosal edema or rhinorrhea.     Right Turbinates: Not enlarged.     Left Turbinates: Not enlarged.     Mouth/Throat:     Mouth: Mucous membranes are dry.     Pharynx: Oropharynx is clear. No oropharyngeal exudate.  Eyes:     Pupils: Pupils are equal, round, and reactive to light.  Cardiovascular:     Rate and Rhythm: Normal rate and regular rhythm.     Pulses: Normal pulses.     Heart sounds: Normal heart sounds. No murmur heard.   Pulmonary:     Effort: Pulmonary effort is normal.     Breath sounds: Normal breath sounds. No decreased breath sounds, wheezing or rales.  Musculoskeletal:     Cervical back: Normal range of motion.     Right lower leg: No edema.     Left lower leg: No edema.  Lymphadenopathy:     Cervical: No cervical adenopathy.  Skin:    General: Skin is warm and dry.     Capillary Refill: Capillary refill takes less than 2 seconds.     Findings: No erythema or rash.  Neurological:     General: No focal deficit present.     Mental Status: He is alert and oriented to person, place, and time.     Motor: No weakness.     Coordination: Coordination normal.     Gait: Gait is intact. Gait normal.  Psychiatric:        Mood and Affect: Mood normal.        Behavior: Behavior normal. Behavior is cooperative.        Thought Content: Thought content  normal.        Judgment: Judgment normal.       Assessment & Plan:    Sarcoidosis Presumptive diagnosis of sarcoid Never received biopsy Based off of mediastinal lymphadenopathy, nodularity and elevated ACE level in the past Continued dyspnea despite prednisone taper at last visit  Plan: Walk today in office Chest x-ray today Based off the chest x-ray imaging will need to consider high-resolution CT chest, last CT in 2018 2 to 22-month follow-up with our office   Dyspnea Ongoing dyspnea Likely multifactorial Patient with known cardiomyopathy, end-stage renal disease, suspected sarcoidosis Suspected ectopic beats on physical exam, stable heart rate with physical exertion today, close follow-up with cardiology this month  Plan: Walk today in office Chest x-ray today Keep follow-up with cardiology    Return in about 2 months (around 03/01/2020), or if symptoms worsen or fail to improve, for Follow up with Dr. Elsworth Soho.   Lauraine Rinne, NP 12/31/2019   This appointment required 32 minutes of patient care (this includes precharting, chart review, review of results, face-to-face care, etc.).

## 2019-12-31 NOTE — Assessment & Plan Note (Addendum)
Ongoing dyspnea Likely multifactorial Patient with known cardiomyopathy, end-stage renal disease, suspected sarcoidosis Suspected ectopic beats on physical exam, stable heart rate with physical exertion today, close follow-up with cardiology this month  Plan: Walk today in office Chest x-ray today Keep follow-up with cardiology

## 2020-01-01 ENCOUNTER — Other Ambulatory Visit: Payer: Self-pay | Admitting: Pulmonary Disease

## 2020-01-01 DIAGNOSIS — R0609 Other forms of dyspnea: Secondary | ICD-10-CM

## 2020-01-01 NOTE — Progress Notes (Unsigned)
ct 

## 2020-01-05 ENCOUNTER — Ambulatory Visit: Payer: Medicare Other | Admitting: Adult Health

## 2020-01-07 ENCOUNTER — Ambulatory Visit (INDEPENDENT_AMBULATORY_CARE_PROVIDER_SITE_OTHER): Payer: Managed Care, Other (non HMO) | Admitting: Endocrinology

## 2020-01-07 ENCOUNTER — Other Ambulatory Visit: Payer: Self-pay

## 2020-01-07 ENCOUNTER — Encounter: Payer: Self-pay | Admitting: Endocrinology

## 2020-01-07 VITALS — BP 124/80 | HR 78 | Resp 18 | Ht 69.0 in | Wt 188.6 lb

## 2020-01-07 DIAGNOSIS — N186 End stage renal disease: Secondary | ICD-10-CM | POA: Diagnosis not present

## 2020-01-07 DIAGNOSIS — E1122 Type 2 diabetes mellitus with diabetic chronic kidney disease: Secondary | ICD-10-CM

## 2020-01-07 DIAGNOSIS — E0821 Diabetes mellitus due to underlying condition with diabetic nephropathy: Secondary | ICD-10-CM

## 2020-01-07 LAB — POCT GLYCOSYLATED HEMOGLOBIN (HGB A1C): Hemoglobin A1C: 8.2 % — AB (ref 4.0–5.6)

## 2020-01-07 MED ORDER — NOVOLOG MIX 70/30 FLEXPEN (70-30) 100 UNIT/ML ~~LOC~~ SUPN: PEN_INJECTOR | SUBCUTANEOUS | 11 refills | Status: DC

## 2020-01-07 NOTE — Patient Instructions (Addendum)
Please change the insulin to 10 units when you wake up, and 40 with lunch Please come back for a follow-up appointment in 2 months.

## 2020-01-07 NOTE — Progress Notes (Signed)
Subjective:    Patient ID: Brandon Carroll, male    DOB: [birth year], 59 y.o.   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   Dx'ed: 0160 Complications: ESRD (on HD).   Therapy: insulin since 2014.   DKA: never.  Severe hypoglycemia: once (early 2020).  Pancreatitis: never.  SDOH: he is on QD insulin, due to h/o noncompliance; he takes HD mornings of MWF.  Other: He works 1st shift, as a courier; fructosamine converts to slightly higher A1c than cbg's and A1c itself; he changed Lantus to NPH, then 75/25, due to pattern of cbg's.   Interval history: we are unable to access continuous glucose monitor data.  Pt says glucose varies from 50-280.  It is in general highest at midday (even if that is fasting--he is not allowed to eat at HD). Pt says he never misses the insulin.  Pt says he still has mild hypoglycemia approx once per week.  This usually happens in the afternoon.   Past Medical History:  Diagnosis Date  . Acute systolic CHF (congestive heart failure), NYHA class 2 ([place]) 05/04/2015  . Acute-on-chronic kidney injury ([place]) 05/04/2015  . Diabetes mellitus   . Erectile dysfunction   . Hyperlipidemia   . Hypertension     Past Surgical History:  Procedure Laterality Date  . AV FISTULA PLACEMENT Left 05/26/2018   Procedure: ARTERIOVENOUS (AV) FISTULA CREATION LEFT ARM;  Surgeon: Marty Heck, MD;  Location: [facility];  Service: Vascular;  Laterality: Left;  . CARDIAC CATHETERIZATION N/A 06/15/2015   Procedure: Left Heart Cath and Coronary Angiography;  Surgeon: Peter M Martinique, MD;  Location: [facility];  Service: Cardiovascular;  Laterality: N/A;  . CHOLECYSTECTOMY N/A 06/28/2017   Procedure: LAPAROSCOPIC CHOLECYSTECTOMY;  Surgeon: Ralene Ok, MD;  Location: [facility];  Service: General;  Laterality: N/A;  . HERNIA REPAIR  7th grade   umbilical  . LEFT HEART CATH AND CORONARY  ANGIOGRAPHY N/A 04/28/2019   Procedure: LEFT HEART CATH AND CORONARY ANGIOGRAPHY;  Surgeon: Leonie Man, MD;  Location: [facility];  Service: Cardiovascular;  Laterality: N/A;    Social History   Socioeconomic History  . Marital status: Divorced    Spouse name: n/a  . Number of children: 1  . Years of education: 12+  . Highest education level: Not on file  Occupational History  . Occupation: Building services engineer: [employer]  Tobacco Use  . Smoking status: Never Smoker  . Smokeless tobacco: Never Used  Vaping Use  . Vaping Use: Never used  Substance and Sexual Activity  . Alcohol use: No    Alcohol/week: 0.0 standard drinks  . Drug use: No  . Sexual activity: Not Currently    Comment: Erectile dysfunciotn  Other Topics Concern  . Not on file  Social History Narrative   Divorced. Education: [employer].    Unable to exercise due to significant SOB and DOE.    Lives alone.   Son lives in [place].   Social Determinants of Health   Financial Resource Strain:   . Difficulty of Paying Living Expenses: Not on file  Food Insecurity:   . Worried About Charity fundraiser in the Last Year: Not on file  . Ran Out of Food in the Last Year: Not on file  Transportation Needs:   . Lack of Transportation (Medical): Not on file  .  Lack of Transportation (Non-Medical): Not on file  Physical Activity:   . Days of Exercise per Week: Not on file  . Minutes of Exercise per Session: Not on file  Stress:   . Feeling of Stress : Not on file  Social Connections:   . Frequency of Communication with Friends and Family: Not on file  . Frequency of Social Gatherings with Friends and Family: Not on file  . Attends Religious Services: Not on file  . Active Member of Clubs or Organizations: Not on file  . Attends Archivist Meetings: Not on file  . Marital Status: Not on file  Intimate Partner Violence:   . Fear of Current or Ex-Partner: Not on file  . Emotionally  Abused: Not on file  . Physically Abused: Not on file  . Sexually Abused: Not on file    Current Outpatient Medications on File Prior to Visit  Medication Sig Dispense Refill  . albuterol (PROVENTIL) (2.5 MG/3ML) 0.083% nebulizer solution Take 3 mLs (2.5 mg total) by nebulization every 6 (six) hours as needed for wheezing or shortness of breath. 75 mL 5  . aspirin EC 81 MG tablet Take 81 mg by mouth daily.    . BD PEN NEEDLE NANO 2ND GEN 32G X 4 MM MISC USE DAILY WITH INSULIN 100 each 0  . carvedilol (COREG) 6.25 MG tablet Take 1 tablet (6.25 mg total) by mouth 2 (two) times daily with a meal. 180 tablet 0  . Continuous Blood Gluc Receiver (FREESTYLE LIBRE 14 DAY READER) DEVI USE AS DIRECTED 6 Device 3  . Continuous Blood Gluc Sensor (FREESTYLE LIBRE 14 DAY SENSOR) MISC 1 each by Does not apply route every 14 (fourteen) days. For use with continuous glucose monitoring system. Change every 14 days 2 each 11  . glucose blood (ONETOUCH VERIO) test strip 1 each by Other route 2 (two) times daily as needed for other. And lancets 2/day 100 each 12  . isosorbide mononitrate (IMDUR) 60 MG 24 hr tablet TAKE 1 TABLET(60 MG) BY MOUTH DAILY 90 tablet 1  . lidocaine-prilocaine (EMLA) cream APPLY SMALL AMOUNT TO ACCESS SITE (AVF) 1 HOUR BEFORE DIALYSIS. COVER WITH OCCLUSIVE DRESSING (SARAN WRAP)    . nystatin-triamcinolone ointment (MYCOLOG) Apply 1 application topically 2 (two) times daily. 100 g 3  . rosuvastatin (CRESTOR) 20 MG tablet Take 2 tablets (40 mg total) by mouth daily. 90 tablet 3  . hydrALAZINE (APRESOLINE) 25 MG tablet Take 1 tablet (25 mg total) by mouth 3 (three) times daily. 270 tablet 3   No current facility-administered medications on file prior to visit.    Allergies  Allergen Reactions  . Lipitor [Atorvastatin] Hives and Itching    Family History  Problem Relation Age of Onset  . Arthritis Mother   . COPD Mother   . Diabetes Mother        was thin, took insulin  .  Hypertension Mother   . Hypertension Father   . Arthritis Sister        rheumatoid  . COPD Sister   . Diabetes Brother   . Colon cancer Neg Hx     BP 124/80   Pulse 78   Resp 18   Ht 5\' 9"  (1.753 m)   Wt 188 lb 9.6 oz (85.5 kg)   SpO2 98%   BMI 27.85 kg/m    Review of Systems Denies LOC    Objective:   Physical Exam VITAL SIGNS:  See vs page GENERAL: no distress  Pulses: dorsalis pedis intact bilat.   MSK: no deformity of the feet CV: no leg edema Skin:  no ulcer on the feet.  normal color and temp on the feet. Neuro: sensation is intact to touch on the feet.   Lab Results  Component Value Date   HGBA1C 8.2 (A) 01/07/2020       Assessment & Plan:  Insulin-requiring type 2 DM, with ESRD: uncontrolled Hypoglycemia, due to insulin: this limits aggressiveness of glycemic control   Patient Instructions  Please change the insulin to 10 units when you wake up, and 40 with lunch Please come back for a follow-up appointment in 2 months.

## 2020-01-12 ENCOUNTER — Ambulatory Visit: Payer: Managed Care, Other (non HMO) | Admitting: Cardiology

## 2020-01-14 ENCOUNTER — Ambulatory Visit
Admission: RE | Admit: 2020-01-14 | Discharge: 2020-01-14 | Disposition: A | Discharge status: Home / Self Care | Payer: Managed Care, Other (non HMO) | Source: Ambulatory Visit | Attending: Pulmonary Disease | Admitting: Pulmonary Disease

## 2020-01-14 DIAGNOSIS — R0609 Other forms of dyspnea: Secondary | ICD-10-CM

## 2020-03-08 ENCOUNTER — Other Ambulatory Visit: Payer: Self-pay

## 2020-03-08 ENCOUNTER — Encounter: Payer: Self-pay | Admitting: Podiatry

## 2020-03-08 ENCOUNTER — Ambulatory Visit (INDEPENDENT_AMBULATORY_CARE_PROVIDER_SITE_OTHER): Payer: Managed Care, Other (non HMO) | Admitting: Podiatry

## 2020-03-08 DIAGNOSIS — M79676 Pain in unspecified toe(s): Secondary | ICD-10-CM | POA: Diagnosis not present

## 2020-03-08 DIAGNOSIS — E1142 Type 2 diabetes mellitus with diabetic polyneuropathy: Secondary | ICD-10-CM

## 2020-03-08 DIAGNOSIS — L84 Corns and callosities: Secondary | ICD-10-CM | POA: Diagnosis not present

## 2020-03-08 DIAGNOSIS — B351 Tinea unguium: Secondary | ICD-10-CM | POA: Diagnosis not present

## 2020-03-10 ENCOUNTER — Other Ambulatory Visit: Payer: Self-pay

## 2020-03-10 ENCOUNTER — Encounter: Payer: Self-pay | Admitting: Endocrinology

## 2020-03-10 ENCOUNTER — Ambulatory Visit (INDEPENDENT_AMBULATORY_CARE_PROVIDER_SITE_OTHER): Payer: Managed Care, Other (non HMO) | Admitting: Endocrinology

## 2020-03-10 VITALS — BP 120/80 | HR 91 | Ht 69.0 in | Wt 183.0 lb

## 2020-03-10 DIAGNOSIS — I2510 Atherosclerotic heart disease of native coronary artery without angina pectoris: Secondary | ICD-10-CM

## 2020-03-10 DIAGNOSIS — Z794 Long term (current) use of insulin: Secondary | ICD-10-CM

## 2020-03-10 DIAGNOSIS — N184 Chronic kidney disease, stage 4 (severe): Secondary | ICD-10-CM

## 2020-03-10 DIAGNOSIS — E1122 Type 2 diabetes mellitus with diabetic chronic kidney disease: Secondary | ICD-10-CM

## 2020-03-10 LAB — POCT GLYCOSYLATED HEMOGLOBIN (HGB A1C): Hemoglobin A1C: 7.6 % — AB (ref 4.0–5.6)

## 2020-03-10 MED ORDER — NOVOLOG MIX 70/30 FLEXPEN (70-30) 100 UNIT/ML ~~LOC~~ SUPN
PEN_INJECTOR | SUBCUTANEOUS | 11 refills | Status: DC
Start: 2020-03-10 — End: 2020-06-16

## 2020-03-10 NOTE — Patient Instructions (Addendum)
Please change the insulin to 20 units when you wake up, and 30 with lunch.   Please come back for a follow-up appointment in 3 months.

## 2020-03-10 NOTE — Progress Notes (Signed)
Subjective:    Patient ID: Brandon Carroll, male    DOB: June 11, 1960, 59 y.o.   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   Dx'ed: 4259 Complications: ESRD (on HD).   Therapy: insulin since 2014.   DKA: never.  Severe hypoglycemia: once (early 2020).  Pancreatitis: never.  SDOH: he is on BID insulin, due to h/o noncompliance; he takes HD mornings of MWF.  Other: He works 1st shift, as a courier; fructosamine converts to slightly higher A1c than cbg's and A1c itself; he changed Lantus to NPH, then 75/25, due to pattern of cbg's.   Interval history: we are unable to access continuous glucose monitor data.  Pt did not bring reader today.  Pt says glucose varies from 50-300.  It is in general lowest at HS, and highest at midday (even if that is fasting--he is not allowed to eat at HD). Pt says he never misses the insulin.  Pt says he still has mild hypoglycemia approx once per week.   Past Medical History:  Diagnosis Date  . Acute systolic CHF (congestive heart failure), NYHA class 2 ([place]) 05/04/2015  . Acute-on-chronic kidney injury ([place]) 05/04/2015  . Diabetes mellitus   . Erectile dysfunction   . Hyperlipidemia   . Hypertension     Past Surgical History:  Procedure Laterality Date  . AV FISTULA PLACEMENT Left 05/26/2018   Procedure: ARTERIOVENOUS (AV) FISTULA CREATION LEFT ARM;  Surgeon: Marty Heck, MD;  Location: [facility];  Service: Vascular;  Laterality: Left;  . CARDIAC CATHETERIZATION N/A 06/15/2015   Procedure: Left Heart Cath and Coronary Angiography;  Surgeon: Peter M Martinique, MD;  Location: [facility];  Service: Cardiovascular;  Laterality: N/A;  . CHOLECYSTECTOMY N/A 06/28/2017   Procedure: LAPAROSCOPIC CHOLECYSTECTOMY;  Surgeon: Ralene Ok, MD;  Location: [facility];  Service: General;  Laterality: N/A;  . HERNIA REPAIR  7th grade   umbilical  . LEFT HEART CATH AND  CORONARY ANGIOGRAPHY N/A 04/28/2019   Procedure: LEFT HEART CATH AND CORONARY ANGIOGRAPHY;  Surgeon: Leonie Man, MD;  Location: [facility];  Service: Cardiovascular;  Laterality: N/A;    Social History   Socioeconomic History  . Marital status: Divorced    Spouse name: n/a  . Number of children: 1  . Years of education: 12+  . Highest education level: Not on file  Occupational History  . Occupation: Building services engineer: [employer]  Tobacco Use  . Smoking status: Never Smoker  . Smokeless tobacco: Never Used  Vaping Use  . Vaping Use: Never used  Substance and Sexual Activity  . Alcohol use: No    Alcohol/week: 0.0 standard drinks  . Drug use: No  . Sexual activity: Not Currently    Comment: Erectile dysfunciotn  Other Topics Concern  . Not on file  Social History Narrative   Divorced. Education: [employer].    Unable to exercise due to significant SOB and DOE.    Lives alone.   Son lives in [place].   Social Determinants of Health   Financial Resource Strain:   . Difficulty of Paying Living Expenses: Not on file  Food Insecurity:   . Worried About Charity fundraiser in the Last Year: Not on file  . Ran Out of Food in the Last Year: Not on file  Transportation Needs:   . Lack of Transportation (Medical):  Not on file  . Lack of Transportation (Non-Medical): Not on file  Physical Activity:   . Days of Exercise per Week: Not on file  . Minutes of Exercise per Session: Not on file  Stress:   . Feeling of Stress : Not on file  Social Connections:   . Frequency of Communication with Friends and Family: Not on file  . Frequency of Social Gatherings with Friends and Family: Not on file  . Attends Religious Services: Not on file  . Active Member of Clubs or Organizations: Not on file  . Attends Archivist Meetings: Not on file  . Marital Status: Not on file  Intimate Partner Violence:   . Fear of Current or Ex-Partner: Not on file  .  Emotionally Abused: Not on file  . Physically Abused: Not on file  . Sexually Abused: Not on file    Current Outpatient Medications on File Prior to Visit  Medication Sig Dispense Refill  . albuterol (PROVENTIL) (2.5 MG/3ML) 0.083% nebulizer solution Take 3 mLs (2.5 mg total) by nebulization every 6 (six) hours as needed for wheezing or shortness of breath. 75 mL 5  . aspirin EC 81 MG tablet Take 81 mg by mouth daily.    Marland Kitchen b complex-vitamin c-folic acid (NEPHRO-VITE) 0.8 MG TABS tablet Take 1 tablet by mouth daily.    . BD PEN NEEDLE NANO 2ND GEN 32G X 4 MM MISC USE DAILY WITH INSULIN 100 each 0  . carvedilol (COREG) 6.25 MG tablet Take 1 tablet (6.25 mg total) by mouth 2 (two) times daily with a meal. 180 tablet 0  . Continuous Blood Gluc Receiver (FREESTYLE LIBRE 14 DAY READER) DEVI USE AS DIRECTED 6 Device 3  . Continuous Blood Gluc Sensor (FREESTYLE LIBRE 14 DAY SENSOR) MISC 1 each by Does not apply route every 14 (fourteen) days. For use with continuous glucose monitoring system. Change every 14 days 2 each 11  . glucose blood (ONETOUCH VERIO) test strip 1 each by Other route 2 (two) times daily as needed for other. And lancets 2/day 100 each 12  . isosorbide mononitrate (IMDUR) 60 MG 24 hr tablet TAKE 1 TABLET(60 MG) BY MOUTH DAILY 90 tablet 1  . lidocaine-prilocaine (EMLA) cream APPLY SMALL AMOUNT TO ACCESS SITE (AVF) 1 HOUR BEFORE DIALYSIS. COVER WITH OCCLUSIVE DRESSING (SARAN WRAP)    . Methoxy PEG-Epoetin Beta (MIRCERA IJ) Mircera    . Nutritional Supplements (VITAMIN D BOOSTER PO) Take by mouth.    . nystatin-triamcinolone ointment (MYCOLOG) Apply 1 application topically 2 (two) times daily. 100 g 3  . rosuvastatin (CRESTOR) 20 MG tablet Take 2 tablets (40 mg total) by mouth daily. 90 tablet 3  . sevelamer carbonate (RENVELA) 800 MG tablet Take 2,400 mg by mouth 3 (three) times daily.    . hydrALAZINE (APRESOLINE) 25 MG tablet Take 1 tablet (25 mg total) by mouth 3 (three) times  daily. 270 tablet 3   No current facility-administered medications on file prior to visit.    Allergies  Allergen Reactions  . Lipitor [Atorvastatin] Hives and Itching    Family History  Problem Relation Age of Onset  . Arthritis Mother   . COPD Mother   . Diabetes Mother        was thin, took insulin  . Hypertension Mother   . Hypertension Father   . Arthritis Sister        rheumatoid  . COPD Sister   . Diabetes Brother   . Colon cancer  Neg Hx     BP 120/80   Pulse 91   Ht 5\' 9"  (1.753 m)   Wt 183 lb (83 kg)   SpO2 99%   BMI 27.02 kg/m    Review of Systems     Objective:   Physical Exam VITAL SIGNS:  See vs page GENERAL: no distress Pulses: dorsalis pedis intact bilat.   MSK: no deformity of the feet CV: no leg edema Skin:  no ulcer on the feet.  normal color and temp on the feet. Neuro: sensation is intact to touch on the feet  Lab Results  Component Value Date   HGBA1C 7.6 (A) 03/10/2020       Assessment & Plan:  Insulin-requiring type 2 DM, with ESRD: The pattern of his cbg's indicates he needs some adjustment in his therapy Hypoglycemia, due to insulin: this limits aggressiveness of glycemic control.    Patient Instructions  Please change the insulin to 20 units when you wake up, and 30 with lunch.   Please come back for a follow-up appointment in 3 months.

## 2020-03-12 NOTE — Progress Notes (Signed)
Subjective:  Patient ID: Brandon Carroll, male    DOB: Oct 11, 1960,  MRN: [id number]  59 y.o. male presents with at risk foot care with h/o NIDDM with ESRD on hemodialysis and painful callus(es) b/l feet and painful thick toenails that are difficult to trim. Painful toenails interfere with ambulation. Aggravating factors include wearing enclosed shoe gear. Pain is relieved with periodic professional debridement. Painful calluses are aggravated when weightbearing with and without shoegear. Pain is relieved with periodic professional debridement.  PCP: Hoyt Koch, MD and last visit was: 06/12/2019. He is also followed by Dr. Renato Shin for his diabetes. Last visit was 01/07/2020.  He voices no new pedal concerns on today's visit.  Review of Systems: Negative except as noted in the HPI.  Past Medical History:  Diagnosis Date  . Acute systolic CHF (congestive heart failure), NYHA class 2 ([place]) 05/04/2015  . Acute-on-chronic kidney injury ([place]) 05/04/2015  . Diabetes mellitus   . Erectile dysfunction   . Hyperlipidemia   . Hypertension    Past Surgical History:  Procedure Laterality Date  . AV FISTULA PLACEMENT Left 05/26/2018   Procedure: ARTERIOVENOUS (AV) FISTULA CREATION LEFT ARM;  Surgeon: Marty Heck, MD;  Location: [facility];  Service: Vascular;  Laterality: Left;  . CARDIAC CATHETERIZATION N/A 06/15/2015   Procedure: Left Heart Cath and Coronary Angiography;  Surgeon: Peter M Martinique, MD;  Location: [facility];  Service: Cardiovascular;  Laterality: N/A;  . CHOLECYSTECTOMY N/A 06/28/2017   Procedure: LAPAROSCOPIC CHOLECYSTECTOMY;  Surgeon: Ralene Ok, MD;  [facility];  Service: General;  Laterality: N/A;  . HERNIA REPAIR  7th grade   umbilical  . LEFT HEART CATH AND CORONARY ANGIOGRAPHY N/A 04/28/2019   Procedure: LEFT HEART CATH AND CORONARY ANGIOGRAPHY;  Surgeon: Leonie Man, MD;  Location: [facility];  Service: Cardiovascular;   Laterality: N/A;   Patient Active Problem List   Diagnosis Date Noted  . Diabetes ([place]) 06/20/2019  . Encounter for general adult medical examination with abnormal findings 06/12/2019  . Abnormal echocardiogram 04/28/2019  . ESRD (end stage renal disease) on dialysis ([place])   . A-V fistula ([place]) 03/12/2019  . Fluid overload, unspecified 01/26/2019  . Coagulation defect, unspecified ([place]) 12/10/2018  . Anemia in chronic kidney disease 11/18/2018  . Mild protein-calorie malnutrition ([place]) 11/14/2018  . Iron deficiency anemia, unspecified 11/03/2018  . Diarrhea, unspecified 10/22/2018  . Pain, unspecified 10/22/2018  . Pruritus, unspecified 10/22/2018  . Body mass index (BMI) 27.0-27.9, adult 10/20/2018  . Hyperlipidemia 10/20/2018  . Secondary hyperparathyroidism ([place]) 10/20/2018  . Coronary artery disease 02/17/2018  . Essential hypertension 07/03/2016  . Sarcoidosis 07/02/2016  . Low testosterone 10/04/2015  . Erectile dysfunction 06/21/2015  . Chronic systolic CHF (congestive heart failure) ([place]) 06/15/2015  . Abnormal CT of the chest 06/08/2015  . Secondary cardiomyopathy ([place]) 06/08/2015  . Anemia 06/06/2015  . Dyspnea   . Hyperlipidemia associated with type 2 diabetes mellitus ([place]) 09/18/2011    Current Outpatient Medications:  .  albuterol (PROVENTIL) (2.5 MG/3ML) 0.083% nebulizer solution, Take 3 mLs (2.5 mg total) by nebulization every 6 (six) hours as needed for wheezing or shortness of breath., Disp: 75 mL, Rfl: 5 .  aspirin EC 81 MG tablet, Take 81 mg by mouth daily., Disp: , Rfl:  .  b complex-vitamin c-folic acid (NEPHRO-VITE) 0.8 MG TABS tablet, Take 1 tablet by mouth daily., Disp: , Rfl:  .  BD PEN NEEDLE NANO 2ND GEN 32G X 4 MM MISC,  USE DAILY WITH INSULIN, Disp: 100 each, Rfl: 0 .  carvedilol (COREG) 6.25 MG tablet, Take 1 tablet (6.25 mg total) by mouth 2 (two) times daily with a meal., Disp: 180 tablet, Rfl: 0 .  Continuous Blood Gluc Receiver (FREESTYLE LIBRE  14 DAY READER) DEVI, USE AS DIRECTED, Disp: 6 Device, Rfl: 3 .  Continuous Blood Gluc Sensor (FREESTYLE LIBRE 14 DAY SENSOR) MISC, 1 each by Does not apply route every 14 (fourteen) days. For use with continuous glucose monitoring system. Change every 14 days, Disp: 2 each, Rfl: 11 .  glucose blood (ONETOUCH VERIO) test strip, 1 each by Other route 2 (two) times daily as needed for other. And lancets 2/day, Disp: 100 each, Rfl: 12 .  isosorbide mononitrate (IMDUR) 60 MG 24 hr tablet, TAKE 1 TABLET(60 MG) BY MOUTH DAILY, Disp: 90 tablet, Rfl: 1 .  lidocaine-prilocaine (EMLA) cream, APPLY SMALL AMOUNT TO ACCESS SITE (AVF) 1 HOUR BEFORE DIALYSIS. COVER WITH OCCLUSIVE DRESSING (SARAN WRAP), Disp: , Rfl:  .  Methoxy PEG-Epoetin Beta (MIRCERA IJ), Mircera, Disp: , Rfl:  .  Nutritional Supplements (VITAMIN D BOOSTER PO), Take by mouth., Disp: , Rfl:  .  nystatin-triamcinolone ointment (MYCOLOG), Apply 1 application topically 2 (two) times daily., Disp: 100 g, Rfl: 3 .  rosuvastatin (CRESTOR) 20 MG tablet, Take 2 tablets (40 mg total) by mouth daily., Disp: 90 tablet, Rfl: 3 .  sevelamer carbonate (RENVELA) 800 MG tablet, Take 2,400 mg by mouth 3 (three) times daily., Disp: , Rfl:  .  hydrALAZINE (APRESOLINE) 25 MG tablet, Take 1 tablet (25 mg total) by mouth 3 (three) times daily., Disp: 270 tablet, Rfl: 3 .  insulin aspart protamine - aspart (NOVOLOG MIX 70/30 FLEXPEN) (70-30) 100 UNIT/ML FlexPen, 20 units in the morning, and 30 with lunch, and pen needles 1/day, Disp: 15 mL, Rfl: 11 Allergies  Allergen Reactions  . Lipitor [Atorvastatin] Hives and Itching   Social History   Tobacco Use  Smoking Status Never Smoker  Smokeless Tobacco Never Used    Objective:  There were no vitals filed for this visit. Constitutional Patient is a pleasant 59 y.o. African American male in NAD. AAO x 3.  Vascular Capillary refill time to digits immediate b/l. Palpable pedal pulses b/l LE. Pedal hair present. Lower  extremity skin temperature gradient within normal limits. No pain with calf compression b/l. No edema noted b/l lower extremities. No cyanosis or clubbing noted.  Neurologic Normal speech. Protective sensation intact 5/5 intact bilaterally with 10g monofilament b/l. Vibratory sensation intact b/l. Proprioception intact bilaterally.  Dermatologic Pedal skin with normal turgor, texture and tone bilaterally. No open wounds bilaterally. No interdigital macerations bilaterally. Toenails 1-5 b/l elongated, discolored, dystrophic, thickened, crumbly with subungual debris and tenderness to dorsal palpation. Hyperkeratotic lesion(s) sub 5th met base right foot and plantar aspect b/l heel pads.  No erythema, no edema, no drainage, no flocculence.  Orthopedic: Normal muscle strength 5/5 to all lower extremity muscle groups bilaterally. No pain crepitus or joint limitation noted with ROM b/l. Adductovarus deformity b/l 5th digits.   Last A1c was 8.2 %, August, 2021.  Assessment:   1. Pain due to onychomycosis of toenail   2. Callus   3. Diabetic polyneuropathy associated with type 2 diabetes mellitus ([place])    Plan:  Patient was evaluated and treated and all questions answered.  Onychomycosis with pain -Nails palliatively debridement as below. -Educated on self-care  Procedure: Nail Debridement Rationale: Pain Type of Debridement: manual, sharp debridement. Instrumentation: Nail  nipper, rotary burr. Number of Nails: 10  -Examined patient. -No new findings. No new orders. -Continue diabetic foot care principles. -Toenails 1-5 b/l were debrided in length and girth with sterile nail nippers and dremel without iatrogenic bleeding.  -Calluses pared sub 5th met base right foot and sub heel pads b/l feet utilizing sterile scalpel blade without incident. -Patient to report any pedal injuries to medical professional immediately. -Patient to continue soft, supportive shoe gear daily. -Patient/POA to call  should there be question/concern in the interim.  Return in about 3 months (around 06/08/2020) for diabetic toenails, corn(s)/callus(es).  Marzetta Board, DPM

## 2020-03-24 ENCOUNTER — Ambulatory Visit: Payer: Managed Care, Other (non HMO) | Admitting: Cardiology

## 2020-04-05 ENCOUNTER — Ambulatory Visit: Payer: Medicare Other | Admitting: Cardiology

## 2020-05-09 ENCOUNTER — Other Ambulatory Visit: Payer: Self-pay

## 2020-05-09 DIAGNOSIS — I10 Essential (primary) hypertension: Secondary | ICD-10-CM | POA: Insufficient documentation

## 2020-05-10 ENCOUNTER — Ambulatory Visit: Payer: Medicare Other | Admitting: Cardiology

## 2020-05-11 ENCOUNTER — Encounter: Payer: Self-pay | Admitting: Cardiology

## 2020-05-22 ENCOUNTER — Other Ambulatory Visit: Payer: Self-pay | Admitting: Cardiology

## 2020-05-23 ENCOUNTER — Other Ambulatory Visit: Payer: Self-pay

## 2020-05-23 ENCOUNTER — Telehealth (INDEPENDENT_AMBULATORY_CARE_PROVIDER_SITE_OTHER): Payer: Managed Care, Other (non HMO) | Admitting: Internal Medicine

## 2020-05-23 ENCOUNTER — Encounter: Payer: Self-pay | Admitting: Internal Medicine

## 2020-05-23 DIAGNOSIS — U071 COVID-19: Secondary | ICD-10-CM | POA: Diagnosis not present

## 2020-05-23 MED ORDER — DOXYCYCLINE HYCLATE 100 MG PO TABS: 100.0000 mg | ORAL_TABLET | Freq: Two times a day (BID) | ORAL | 0 refills | Status: DC

## 2020-05-23 MED ORDER — HYDROCOD POLST-CPM POLST ER 10-8 MG/5ML PO SUER
5.0000 mL | Freq: Two times a day (BID) | ORAL | 0 refills | Status: DC | PRN
Start: 2020-05-23 — End: 2020-06-16

## 2020-05-23 NOTE — Telephone Encounter (Signed)
Needs appointment for future refills.

## 2020-05-23 NOTE — Assessment & Plan Note (Signed)
Acute Diagnosed 1/3-symptoms started the end of December Symptoms were getting better, but have started to get worse.  Worsening of his cough and now becoming more productive, some wheezing and low-grade fevers Multiple medical problems high risk for complications Will start antibiotic-doxycycline 100 mg twice daily x10 days Tussionex cough syrup He does go to hemodialysis 3 times a week and they monitor his oxygen level and listen to his lungs Advised that if he is not feeling better he should let us know at which point I would refer him to the respiratory clinic for an in person visit Continue symptomatic treatment with over-the-counter cold medications

## 2020-05-23 NOTE — Progress Notes (Signed)
Virtual Visit via Video Note  I connected with Brandon Carroll on 05/23/20 at  2:00 PM EST by a video enabled telemedicine application and verified that I am speaking with the correct person using two identifiers.   I discussed the limitations of evaluation and management by telemedicine and the availability of in person appointments. The patient expressed understanding and agreed to proceed.  Present for the visit:  Myself, Dr Billey Gosling, [name].  The patient is currently at home and I am in the office.    No referring provider.    History of Present Illness: This is an acute visit for Covid.  He tested positive for Covid 1/3.  His fiance also has Covid.   He has been having chills, cough and headache.  He has had low grade fever.  Started feeling better until yesterday and since then feeling worse.  His cough has been getting worse and he has been experiencing more sputum production.  He states some wheezing.  He does not feel short of breath, but does have some chronic shortness of breath with all of his chronic medical problems.  Last night he had a low-grade fever and he has been experiencing headaches.  He goes to dialysis 3 times a week and has multiple chronic medical problems.  Review of Systems  Constitutional: Positive for fever (low grade). Negative for chills.       Decreased appetite  HENT: Positive for congestion and sinus pain. Negative for sore throat.   Respiratory: Positive for cough, sputum production and wheezing. Negative for shortness of breath.   Neurological: Positive for headaches.     Social History   Socioeconomic History  . Marital status: Divorced    Spouse name: n/a  . Number of children: 1  . Years of education: 12+  . Highest education level: Not on file  Occupational History  . Occupation: Building services engineer: [employer]  Tobacco Use  . Smoking status: Never Smoker  . Smokeless tobacco: Never Used  Vaping Use  .  Vaping Use: Never used  Substance and Sexual Activity  . Alcohol use: No    Alcohol/week: 0.0 standard drinks  . Drug use: No  . Sexual activity: Not Currently    Comment: Erectile dysfunciotn  Other Topics Concern  . Not on file  Social History Narrative   Divorced. Education: [employer].    Unable to exercise due to significant SOB and DOE.    Lives alone.   Son lives in [place].   Social Determinants of Health   Financial Resource Strain: Not on file  Food Insecurity: Not on file  Transportation Needs: Not on file  Physical Activity: Not on file  Stress: Not on file  Social Connections: Not on file     Observations/Objective: Appears well in NAD Breathing normally Skin appears warm and dry  Assessment and Plan:  See Problem List for Assessment and Plan of chronic medical problems.   Follow Up Instructions:    I discussed the assessment and treatment plan with the patient. The patient was provided an opportunity to ask questions and all were answered. The patient agreed with the plan and demonstrated an understanding of the instructions.   The patient was advised to call back or seek an in-person evaluation if the symptoms worsen or if the condition fails to improve as anticipated.    Binnie Rail, MD

## 2020-06-03 ENCOUNTER — Other Ambulatory Visit: Payer: Self-pay | Admitting: Endocrinology

## 2020-06-03 DIAGNOSIS — Z794 Long term (current) use of insulin: Secondary | ICD-10-CM

## 2020-06-08 ENCOUNTER — Telehealth: Payer: Self-pay | Admitting: *Deleted

## 2020-06-08 NOTE — Telephone Encounter (Signed)
Notified pt freestyle libre 14 day sensor was declined by insurance. Pt will call insurance for other alternatives.

## 2020-06-14 ENCOUNTER — Encounter: Payer: Self-pay | Admitting: Podiatry

## 2020-06-14 ENCOUNTER — Other Ambulatory Visit: Payer: Self-pay

## 2020-06-14 ENCOUNTER — Ambulatory Visit (INDEPENDENT_AMBULATORY_CARE_PROVIDER_SITE_OTHER): Payer: Managed Care, Other (non HMO) | Admitting: Podiatry

## 2020-06-14 DIAGNOSIS — B351 Tinea unguium: Secondary | ICD-10-CM

## 2020-06-14 DIAGNOSIS — Z992 Dependence on renal dialysis: Secondary | ICD-10-CM | POA: Diagnosis not present

## 2020-06-14 DIAGNOSIS — N186 End stage renal disease: Secondary | ICD-10-CM | POA: Diagnosis not present

## 2020-06-14 DIAGNOSIS — M79676 Pain in unspecified toe(s): Secondary | ICD-10-CM

## 2020-06-14 DIAGNOSIS — E1122 Type 2 diabetes mellitus with diabetic chronic kidney disease: Secondary | ICD-10-CM

## 2020-06-14 DIAGNOSIS — Z794 Long term (current) use of insulin: Secondary | ICD-10-CM

## 2020-06-14 DIAGNOSIS — L84 Corns and callosities: Secondary | ICD-10-CM | POA: Diagnosis not present

## 2020-06-14 NOTE — Progress Notes (Signed)
Subjective:  Patient ID: Brandon Carroll, male    DOB: [birth year],  MRN: [id number]  60 y.o. male presents with at risk foot care with h/o NIDDM with ESRD on hemodialysis and painful callus(es) b/l feet and painful thick toenails that are difficult to trim. Painful toenails interfere with ambulation. Aggravating factors include wearing enclosed shoe gear. Pain is relieved with periodic professional debridement. Painful calluses are aggravated when weightbearing with and without shoegear. Pain is relieved with periodic professional debridement.   He states his blood glucose was "100-something" this morning. He would like to use the [facility] and states he wants it even if he has to pay for it himself.  He is on hemodialysis on MWF.  PCP: Hoyt Koch, MD and last visit was: 06/12/2019. He is also followed by Dr. Renato Shin for his diabetes. Last visit was 03/10/2020.  He voices no new pedal concerns on today's visit.  Review of Systems: Negative except as noted in the HPI.  Past Medical History:  Diagnosis Date  . A-V fistula ([place]) 03/12/2019  . Abnormal CT of the chest 06/08/2015  . Abnormal echocardiogram 04/28/2019  . Acute systolic CHF (congestive heart failure), NYHA class 2 ([place]) 05/04/2015  . Acute-on-chronic kidney injury ([place]) 05/04/2015  . Anemia 06/06/2015  . Anemia in chronic kidney disease 11/18/2018  . Body mass index (BMI) 27.0-27.9, adult 10/20/2018  . Chronic systolic CHF (congestive heart failure) ([place]) 06/15/2015  . Coronary artery disease 02/17/2018   Cardiac catheterization 2017 showing 10% LAD and 10% RCA Echocardiogram and stress test done in December 2019 showed evidence of old small inferior lateral myocardial infarction  . Diabetes ([place]) 06/20/2019  . Diabetes mellitus   . Diarrhea, unspecified 10/22/2018  . Dyspnea   . Encounter for general adult medical examination with abnormal findings 06/12/2019  . Erectile dysfunction   . ESRD (end stage renal  disease) on dialysis ([place])   . Essential hypertension 07/03/2016  . Fluid overload, unspecified 01/26/2019  . Hyperlipidemia   . Hyperlipidemia associated with type 2 diabetes mellitus ([place]) 09/18/2011   Started statin therapy 09/2011.  Anticipate improvement as glucose control improves.   . Hypertension   . Iron deficiency anemia, unspecified 11/03/2018  . Low testosterone 10/04/2015   12/01/2015 visit with Festus Aloe, MD at [facility].   . Mild protein-calorie malnutrition ([place]) 11/14/2018  . Pain, unspecified 10/22/2018  . Pruritus, unspecified 10/22/2018  . Sarcoidosis 07/02/2016   Presumptive diagnosis, never biopsied, based on mediastinal lymphadenopathy, nodularity and elevated ACE level  . Secondary cardiomyopathy ([place]) 06/08/2015  . Secondary hyperparathyroidism ([place]) 10/20/2018   Past Surgical History:  Procedure Laterality Date  . AV FISTULA PLACEMENT Left 05/26/2018   Procedure: ARTERIOVENOUS (AV) FISTULA CREATION LEFT ARM;  Surgeon: Marty Heck, MD;  Location: [place];  Service: Vascular;  Laterality: Left;  . CARDIAC CATHETERIZATION N/A 06/15/2015   Procedure: Left Heart Cath and Coronary Angiography;  Surgeon: Peter M Martinique, MD;  Location: [place] [facility];  Service: Cardiovascular;  Laterality: N/A;  . CHOLECYSTECTOMY N/A 06/28/2017   Procedure: LAPAROSCOPIC CHOLECYSTECTOMY;  Surgeon: Ralene Ok, MD;  [facility];  Service: General;  Laterality: N/A;  . HERNIA REPAIR  7th grade   umbilical  . LEFT HEART CATH AND CORONARY ANGIOGRAPHY N/A 04/28/2019   Procedure: LEFT HEART CATH AND CORONARY ANGIOGRAPHY;  Surgeon: Leonie Man, MD;  Location: [place] [facility];  Service: Cardiovascular;  Laterality: N/A;   Patient Active Problem List   Diagnosis Date  Noted  . COVID-19 05/23/2020  . Hypertension   . Diabetes ([place]) 06/20/2019  . Encounter for general adult medical examination with abnormal findings 06/12/2019  . Abnormal echocardiogram 04/28/2019  .  ESRD (end stage renal disease) on dialysis ([place])   . A-V fistula ([place]) 03/12/2019  . Fluid overload, unspecified 01/26/2019  . Coagulation defect, unspecified ([place]) 12/10/2018  . Anemia in chronic kidney disease 11/18/2018  . Mild protein-calorie malnutrition ([place]) 11/14/2018  . Iron deficiency anemia, unspecified 11/03/2018  . Diarrhea, unspecified 10/22/2018  . Pain, unspecified 10/22/2018  . Pruritus, unspecified 10/22/2018  . Body mass index (BMI) 27.0-27.9, adult 10/20/2018  . Hyperlipidemia 10/20/2018  . Secondary hyperparathyroidism ([place]) 10/20/2018  . Coronary artery disease 02/17/2018  . Essential hypertension 07/03/2016  . Sarcoidosis 07/02/2016  . Erectile dysfunction 06/21/2015  . Chronic systolic CHF (congestive heart failure) ([place]) 06/15/2015  . Abnormal CT of the chest 06/08/2015  . Secondary cardiomyopathy ([place]) 06/08/2015  . Anemia 06/06/2015  . Dyspnea   . Acute-on-chronic kidney injury ([place]) 05/04/2015  . Acute systolic CHF (congestive heart failure), NYHA class 2 ([place]) 05/04/2015  . Hyperlipidemia associated with type 2 diabetes mellitus ([place]) 09/18/2011    Current Outpatient Medications:  .  doxercalciferol (HECTOROL) 0.5 MCG capsule, Doxercalciferol (Hectorol), Disp: , Rfl:  .  Methoxy PEG-Epoetin Beta (MIRCERA IJ), Mircera, Disp: , Rfl:  .  albuterol (PROVENTIL) (2.5 MG/3ML) 0.083% nebulizer solution, Take 3 mLs (2.5 mg total) by nebulization every 6 (six) hours as needed for wheezing or shortness of breath., Disp: 75 mL, Rfl: 5 .  aspirin EC 81 MG tablet, Take 81 mg by mouth daily., Disp: , Rfl:  .  b complex-vitamin c-folic acid (NEPHRO-VITE) 0.8 MG TABS tablet, Take 1 tablet by mouth daily., Disp: , Rfl:  .  BD PEN NEEDLE NANO 2ND GEN 32G X 4 MM MISC, USE DAILY WITH INSULIN, Disp: 100 each, Rfl: 0 .  carvedilol (COREG) 6.25 MG tablet, Take 1 tablet (6.25 mg total) by mouth 2 (two) times daily with a meal., Disp: 180 tablet, Rfl: 0 .   chlorpheniramine-HYDROcodone (TUSSIONEX PENNKINETIC ER) 10-8 MG/5ML SUER, Take 5 mLs by mouth every 12 (twelve) hours as needed for cough., Disp: 100 mL, Rfl: 0 .  Continuous Blood Gluc Receiver (FREESTYLE LIBRE 14 DAY READER) DEVI, USE AS DIRECTED, Disp: 6 Device, Rfl: 3 .  Continuous Blood Gluc Sensor (FREESTYLE LIBRE 14 DAY SENSOR) MISC, FOR USE WITH CONTINUOUS MONITORING SYTEM. CHANGE EVERY 14 DAYS, Disp: 2 each, Rfl: 11 .  doxycycline (VIBRA-TABS) 100 MG tablet, Take 1 tablet (100 mg total) by mouth 2 (two) times daily., Disp: 20 tablet, Rfl: 0 .  glucose blood (ONETOUCH VERIO) test strip, 1 each by Other route 2 (two) times daily as needed for other. And lancets 2/day, Disp: 100 each, Rfl: 12 .  hydrALAZINE (APRESOLINE) 25 MG tablet, Take 1 tablet (25 mg total) by mouth 3 (three) times daily. Needs appointment for future refill / 1st Attempt, Disp: 270 tablet, Rfl: 0 .  insulin aspart protamine - aspart (NOVOLOG MIX 70/30 FLEXPEN) (70-30) 100 UNIT/ML FlexPen, 20 units in the morning, and 30 with lunch, and pen needles 1/day, Disp: 15 mL, Rfl: 11 .  isosorbide mononitrate (IMDUR) 60 MG 24 hr tablet, TAKE 1 TABLET(60 MG) BY MOUTH DAILY, Disp: 90 tablet, Rfl: 1 .  lidocaine-prilocaine (EMLA) cream, APPLY SMALL AMOUNT TO ACCESS SITE (AVF) 1 HOUR BEFORE DIALYSIS. COVER WITH OCCLUSIVE DRESSING (SARAN WRAP), Disp: , Rfl:  .  Nutritional Supplements (VITAMIN  D BOOSTER PO), Take by mouth., Disp: , Rfl:  .  nystatin-triamcinolone ointment (MYCOLOG), Apply 1 application topically 2 (two) times daily., Disp: 100 g, Rfl: 3 .  rosuvastatin (CRESTOR) 20 MG tablet, Take 2 tablets (40 mg total) by mouth daily., Disp: 90 tablet, Rfl: 3 .  sevelamer carbonate (RENVELA) 800 MG tablet, Take 2,400 mg by mouth 3 (three) times daily., Disp: , Rfl:  .  VITAMIN D PO, Take 1 tablet by mouth daily., Disp: , Rfl:  Allergies  Allergen Reactions  . Lipitor [Atorvastatin] Hives and Itching   Social History   Tobacco Use   Smoking Status Never Smoker  Smokeless Tobacco Never Used    Objective:  There were no vitals filed for this visit. Constitutional Patient is a pleasant 60 y.o. African American male in NAD. AAO x 3.  Vascular Capillary refill time to digits immediate b/l. Palpable pedal pulses b/l LE. Pedal hair present. Lower extremity skin temperature gradient within normal limits. No pain with calf compression b/l. No edema noted b/l lower extremities. No cyanosis or clubbing noted.  Neurologic Normal speech. Protective sensation intact 5/5 intact bilaterally with 10g monofilament b/l. Vibratory sensation intact b/l. Proprioception intact bilaterally.  Dermatologic Pedal skin with normal turgor, texture and tone bilaterally. No open wounds bilaterally. No interdigital macerations bilaterally. Toenails 1-5 b/l elongated, discolored, dystrophic, thickened, crumbly with subungual debris and tenderness to dorsal palpation. Hyperkeratotic lesion(s) sub 5th met base right foot and plantar aspect b/l heel pads.  No erythema, no edema, no drainage, no flocculence.  Orthopedic: Normal muscle strength 5/5 to all lower extremity muscle groups bilaterally. No pain crepitus or joint limitation noted with ROM b/l. Adductovarus deformity b/l 5th digits.   Last A1c was 8.2 %, August, 2021.  Assessment:   1. Pain due to onychomycosis of toenail   2. Callus   3. Type 2 diabetes mellitus with chronic kidney disease on chronic dialysis, with long-term current use of insulin ([place])    Plan:  Patient was evaluated and treated and all questions answered.  Onychomycosis with pain -Nails palliatively debridement as below. -Educated on self-care  Procedure: Nail Debridement Rationale: Pain Type of Debridement: manual, sharp debridement. Instrumentation: Nail nipper, rotary burr. Number of Nails: 10  -Examined patient. -No new findings. No new orders. -Continue diabetic foot care principles. -Toenails 1-5 b/l were  debrided in length and girth with sterile nail nippers and dremel without iatrogenic bleeding.  -Calluses pared sub 5th met base right foot and sub heel pads b/l feet utilizing sterile scalpel blade without incident. -Patient to report any pedal injuries to medical professional immediately. -Patient to continue soft, supportive shoe gear daily. -Patient/POA to call should there be question/concern in the interim.  Return in about 3 months (around 09/11/2020).  Marzetta Board, DPM

## 2020-06-16 ENCOUNTER — Other Ambulatory Visit: Payer: Self-pay

## 2020-06-16 ENCOUNTER — Ambulatory Visit (INDEPENDENT_AMBULATORY_CARE_PROVIDER_SITE_OTHER): Payer: Managed Care, Other (non HMO) | Admitting: Endocrinology

## 2020-06-16 VITALS — BP 120/90 | HR 85 | Ht 70.0 in | Wt 187.4 lb

## 2020-06-16 DIAGNOSIS — E1122 Type 2 diabetes mellitus with diabetic chronic kidney disease: Secondary | ICD-10-CM

## 2020-06-16 DIAGNOSIS — Z794 Long term (current) use of insulin: Secondary | ICD-10-CM

## 2020-06-16 DIAGNOSIS — E0822 Diabetes mellitus due to underlying condition with diabetic chronic kidney disease: Secondary | ICD-10-CM | POA: Diagnosis not present

## 2020-06-16 DIAGNOSIS — N185 Chronic kidney disease, stage 5: Secondary | ICD-10-CM | POA: Diagnosis not present

## 2020-06-16 DIAGNOSIS — N184 Chronic kidney disease, stage 4 (severe): Secondary | ICD-10-CM | POA: Diagnosis not present

## 2020-06-16 LAB — POCT GLYCOSYLATED HEMOGLOBIN (HGB A1C): Hemoglobin A1C: 7.7 % — AB (ref 4.0–5.6)

## 2020-06-16 MED ORDER — NOVOLOG MIX 70/30 FLEXPEN (70-30) 100 UNIT/ML ~~LOC~~ SUPN
PEN_INJECTOR | SUBCUTANEOUS | 11 refills | Status: DC
Start: 2020-06-16 — End: 2021-02-24

## 2020-06-16 MED ORDER — FREESTYLE LIBRE 14 DAY SENSOR MISC: 1.0000 | 11 refills | Status: DC

## 2020-06-16 NOTE — Patient Instructions (Addendum)
Please continue the same insulin. I have sent a prescription to your pharmacy, for the continuous glucose monitor sensors.   Please come back for a follow-up appointment in 3 months.

## 2020-06-16 NOTE — Progress Notes (Signed)
Subjective:    Patient ID: Brandon Carroll, male    DOB: May 17, 1960, 60 y.o.   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   Dx'ed: 99991111 Complications: ESRD (on HD).   Therapy: insulin since 2014.   DKA: never.  Severe hypoglycemia: once (early 2020).  Pancreatitis: never.  SDOH: he is on BID insulin, due to h/o noncompliance; he takes HD mornings of MWF.  Other: fructosamine converts to slightly higher A1c than cbg's and A1c itself; he changed Lantus to NPH, then 75/25, due to pattern of cbg's.   Interval history: we are unable to access continuous glucose monitor data.   Pt says glucose varies from 50-270.  It is in general lowest fasting and highest at HS.  Pt says he never misses the insulin.  Pt says he still has mild hypoglycemia approx once per week.   Past Medical History:  Diagnosis Date  . A-V fistula ([place]) 03/12/2019  . Abnormal CT of the chest 06/08/2015  . Abnormal echocardiogram 04/28/2019  . Acute systolic CHF (congestive heart failure), NYHA class 2 ([place]) 05/04/2015  . Acute-on-chronic kidney injury ([place]) 05/04/2015  . Anemia 06/06/2015  . Anemia in chronic kidney disease 11/18/2018  . Body mass index (BMI) 27.0-27.9, adult 10/20/2018  . Chronic systolic CHF (congestive heart failure) ([place]) 06/15/2015  . Coronary artery disease 02/17/2018   Cardiac catheterization 2017 showing 10% LAD and 10% RCA Echocardiogram and stress test done in December 2019 showed evidence of old small inferior lateral myocardial infarction  . Diabetes ([place]) 06/20/2019  . Diabetes mellitus   . Diarrhea, unspecified 10/22/2018  . Dyspnea   . Encounter for general adult medical examination with abnormal findings 06/12/2019  . Erectile dysfunction   . ESRD (end stage renal disease) on dialysis ([place])   . Essential hypertension 07/03/2016  . Fluid overload, unspecified 01/26/2019  . Hyperlipidemia   .  Hyperlipidemia associated with type 2 diabetes mellitus ([place]) 09/18/2011   Started statin therapy 09/2011.  Anticipate improvement as glucose control improves.   . Hypertension   . Iron deficiency anemia, unspecified 11/03/2018  . Low testosterone 10/04/2015   12/01/2015 visit with Festus Aloe, MD at [facility].   . Mild protein-calorie malnutrition ([place]) 11/14/2018  . Pain, unspecified 10/22/2018  . Pruritus, unspecified 10/22/2018  . Sarcoidosis 07/02/2016   Presumptive diagnosis, never biopsied, based on mediastinal lymphadenopathy, nodularity and elevated ACE level  . Secondary cardiomyopathy ([place]) 06/08/2015  . Secondary hyperparathyroidism ([place]) 10/20/2018    Past Surgical History:  Procedure Laterality Date  . AV FISTULA PLACEMENT Left 05/26/2018   Procedure: ARTERIOVENOUS (AV) FISTULA CREATION LEFT ARM;  Surgeon: Marty Heck, MD;  Location: [place];  Service: Vascular;  Laterality: Left;  . CARDIAC CATHETERIZATION N/A 06/15/2015   Procedure: Left Heart Cath and Coronary Angiography;  Surgeon: Peter M Martinique, MD;  Location: [place] [facility];  Service: Cardiovascular;  Laterality: N/A;  . CHOLECYSTECTOMY N/A 06/28/2017   Procedure: LAPAROSCOPIC CHOLECYSTECTOMY;  Surgeon: Ralene Ok, MD;  [facility];  Service: General;  Laterality: N/A;  . HERNIA REPAIR  7th grade   umbilical  . LEFT HEART CATH AND CORONARY ANGIOGRAPHY N/A 04/28/2019   Procedure: LEFT HEART CATH AND CORONARY ANGIOGRAPHY;  Surgeon: Leonie Man, MD;  Location: [place] [facility];  Service: Cardiovascular;  Laterality: N/A;    Social History   Socioeconomic History  . Marital status:  Divorced    Spouse name: n/a  . Number of children: 1  . Years of education: 12+  . Highest education level: Not on file  Occupational History  . Occupation: Building services engineer: [employer]  Tobacco Use  . Smoking status: Never Smoker  . Smokeless tobacco: Never Used  Vaping Use  . Vaping Use:  Never used  Substance and Sexual Activity  . Alcohol use: No    Alcohol/week: 0.0 standard drinks  . Drug use: No  . Sexual activity: Not Currently    Comment: Erectile dysfunciotn  Other Topics Concern  . Not on file  Social History Narrative   Divorced. Education: [employer].    Unable to exercise due to significant SOB and DOE.    Lives alone.   Son lives in [place].   Social Determinants of Health   Financial Resource Strain: Not on file  Food Insecurity: Not on file  Transportation Needs: Not on file  Physical Activity: Not on file  Stress: Not on file  Social Connections: Not on file  Intimate Partner Violence: Not on file    Current Outpatient Medications on File Prior to Visit  Medication Sig Dispense Refill  . albuterol (PROVENTIL) (2.5 MG/3ML) 0.083% nebulizer solution Take 3 mLs (2.5 mg total) by nebulization every 6 (six) hours as needed for wheezing or shortness of breath. 75 mL 5  . aspirin EC 81 MG tablet Take 81 mg by mouth daily.    Marland Kitchen b complex-vitamin c-folic acid (NEPHRO-VITE) 0.8 MG TABS tablet Take 1 tablet by mouth daily.    . BD PEN NEEDLE NANO 2ND GEN 32G X 4 MM MISC USE DAILY WITH INSULIN 100 each 0  . carvedilol (COREG) 6.25 MG tablet Take 1 tablet (6.25 mg total) by mouth 2 (two) times daily with a meal. 180 tablet 0  . Continuous Blood Gluc Receiver (FREESTYLE LIBRE 14 DAY READER) DEVI USE AS DIRECTED 6 Device 3  . doxercalciferol (HECTOROL) 0.5 MCG capsule Doxercalciferol (Hectorol)    . glucose blood (ONETOUCH VERIO) test strip 1 each by Other route 2 (two) times daily as needed for other. And lancets 2/day 100 each 12  . hydrALAZINE (APRESOLINE) 25 MG tablet Take 1 tablet (25 mg total) by mouth 3 (three) times daily. Needs appointment for future refill / 1st Attempt 270 tablet 0  . isosorbide mononitrate (IMDUR) 60 MG 24 hr tablet TAKE 1 TABLET(60 MG) BY MOUTH DAILY 90 tablet 1  . lidocaine-prilocaine (EMLA) cream APPLY SMALL AMOUNT TO ACCESS SITE  (AVF) 1 HOUR BEFORE DIALYSIS. COVER WITH OCCLUSIVE DRESSING (SARAN WRAP)    . Methoxy PEG-Epoetin Beta (MIRCERA IJ) Mircera    . Nutritional Supplements (VITAMIN D BOOSTER PO) Take by mouth.    . nystatin-triamcinolone ointment (MYCOLOG) Apply 1 application topically 2 (two) times daily. 100 g 3  . rosuvastatin (CRESTOR) 20 MG tablet Take 2 tablets (40 mg total) by mouth daily. 90 tablet 3  . sevelamer carbonate (RENVELA) 800 MG tablet Take 2,400 mg by mouth 3 (three) times daily.    Marland Kitchen VITAMIN D PO Take 1 tablet by mouth daily.     No current facility-administered medications on file prior to visit.    Allergies  Allergen Reactions  . Lipitor [Atorvastatin] Hives and Itching    Family History  Problem Relation Age of Onset  . Arthritis Mother   . COPD Mother   . Diabetes Mother        was thin, took insulin  .  Hypertension Mother   . Hypertension Father   . Arthritis Sister        rheumatoid  . COPD Sister   . Diabetes Brother   . Colon cancer Neg Hx     BP 120/90 (BP Location: Right Arm, Patient Position: Sitting, Cuff Size: Normal)   Pulse 85   Ht '5\' 10"'$  (1.778 m)   Wt 187 lb 6.4 oz (85 kg)   SpO2 97%   BMI 26.89 kg/m    Review of Systems Denies LOC    Objective:   Physical Exam VITAL SIGNS:  See vs page GENERAL: no distress Pulses: dorsalis pedis intact bilat.   MSK: no deformity of the feet CV: no leg edema Skin:  no ulcer on the feet.  normal color and temp on the feet. Neuro: sensation is intact to touch on the feet.   Lab Results  Component Value Date   HGBA1C 7.7 (A) 06/16/2020       Assessment & Plan:  Insulin-requiring type 2 DM, with ESRD Hypoglycemia, due to insulin: this limits aggressiveness of glycemic control  Patient Instructions  Please continue the same insulin. I have sent a prescription to your pharmacy, for the continuous glucose monitor sensors.   Please come back for a follow-up appointment in 3 months.

## 2020-06-19 ENCOUNTER — Other Ambulatory Visit: Payer: Self-pay | Admitting: Endocrinology

## 2020-06-19 DIAGNOSIS — E119 Type 2 diabetes mellitus without complications: Secondary | ICD-10-CM

## 2020-06-24 ENCOUNTER — Other Ambulatory Visit: Payer: Self-pay | Admitting: Cardiology

## 2020-06-24 NOTE — Telephone Encounter (Signed)
Refill for Isosorbide Mononitrate 60 mg ER # 30 tablets only, with message for patient to schedule office visit for future refill / 1st attempt

## 2020-06-27 NOTE — Telephone Encounter (Signed)
Rx refill sent to pharmacy. 

## 2020-07-12 HISTORY — PX: OTHER SURGICAL HISTORY: SHX169

## 2020-07-21 DIAGNOSIS — Z940 Kidney transplant status: Secondary | ICD-10-CM | POA: Insufficient documentation

## 2020-07-21 DIAGNOSIS — D849 Immunodeficiency, unspecified: Secondary | ICD-10-CM | POA: Insufficient documentation

## 2020-07-28 DIAGNOSIS — Z79899 Other long term (current) drug therapy: Secondary | ICD-10-CM | POA: Insufficient documentation

## 2020-07-28 DIAGNOSIS — Z940 Kidney transplant status: Secondary | ICD-10-CM | POA: Insufficient documentation

## 2020-08-18 ENCOUNTER — Encounter: Payer: Self-pay | Admitting: Cardiology

## 2020-08-18 ENCOUNTER — Other Ambulatory Visit: Payer: Self-pay

## 2020-08-18 ENCOUNTER — Ambulatory Visit (INDEPENDENT_AMBULATORY_CARE_PROVIDER_SITE_OTHER): Payer: Managed Care, Other (non HMO) | Admitting: Cardiology

## 2020-08-18 VITALS — BP 138/70 | HR 83 | Ht 69.0 in | Wt 185.0 lb

## 2020-08-18 DIAGNOSIS — I429 Cardiomyopathy, unspecified: Secondary | ICD-10-CM | POA: Diagnosis not present

## 2020-08-18 DIAGNOSIS — I10 Essential (primary) hypertension: Secondary | ICD-10-CM | POA: Diagnosis not present

## 2020-08-18 DIAGNOSIS — Z794 Long term (current) use of insulin: Secondary | ICD-10-CM

## 2020-08-18 DIAGNOSIS — I2510 Atherosclerotic heart disease of native coronary artery without angina pectoris: Secondary | ICD-10-CM

## 2020-08-18 DIAGNOSIS — E1122 Type 2 diabetes mellitus with diabetic chronic kidney disease: Secondary | ICD-10-CM | POA: Diagnosis not present

## 2020-08-18 DIAGNOSIS — N184 Chronic kidney disease, stage 4 (severe): Secondary | ICD-10-CM

## 2020-08-18 NOTE — Patient Instructions (Signed)

## 2020-08-18 NOTE — Progress Notes (Signed)
Cardiology Office Note:    Date:  08/18/2020   ID:  Brandon Carroll, DOB 25-Sep-1960, MRN [id number]  PCP:  Hoyt Koch, MD  Cardiologist:  Jenne Campus, MD    Referring MD: Hoyt Koch, *   Chief Complaint  Patient presents with  . Follow-up  Am doing fine  History of Present Illness:    Brandon Carroll is a 60 y.o. male with past medical history significant for chronic kidney failure, status post kidney transplant just few weeks ago, congestive heart failure recent cardiac catheterization showing mild to moderate disease of the ostial circumflex as well as proximal RCA, no critical lesion has been identified for any potential intervention.  His left ventricle ejection fraction was preserved.  He comes today 2 months of follow-up overall he is doing great he did have a kidney problem and required kidney transplant since that time he is doing great he is very happy he feels dramatically better there is no chest pain tightness squeezing pressure mid chest, he started exercising by walking.  Overall feels good.  Past Medical History:  Diagnosis Date  . A-V fistula ([place]) 03/12/2019  . Abnormal CT of the chest 06/08/2015  . Abnormal echocardiogram 04/28/2019  . Acute systolic CHF (congestive heart failure), NYHA class 2 ([place]) 05/04/2015  . Acute-on-chronic kidney injury ([place]) 05/04/2015  . Anemia 06/06/2015  . Anemia in chronic kidney disease 11/18/2018  . Body mass index (BMI) 27.0-27.9, adult 10/20/2018  . Chronic systolic CHF (congestive heart failure) ([place]) 06/15/2015  . Coronary artery disease 02/17/2018   Cardiac catheterization 2017 showing 10% LAD and 10% RCA Echocardiogram and stress test done in December 2019 showed evidence of old small inferior lateral myocardial infarction  . Diabetes ([place]) 06/20/2019  . Diabetes mellitus   . Diarrhea, unspecified 10/22/2018  . Dyspnea   . Encounter for general adult medical examination with abnormal findings 06/12/2019  .  Erectile dysfunction   . ESRD (end stage renal disease) on dialysis ([place])   . Essential hypertension 07/03/2016  . Fluid overload, unspecified 01/26/2019  . Hyperlipidemia   . Hyperlipidemia associated with type 2 diabetes mellitus ([place]) 09/18/2011   Started statin therapy 09/2011.  Anticipate improvement as glucose control improves.   . Hypertension   . Iron deficiency anemia, unspecified 11/03/2018  . Low testosterone 10/04/2015   12/01/2015 visit with Festus Aloe, MD at [facility].   . Mild protein-calorie malnutrition ([place]) 11/14/2018  . Pain, unspecified 10/22/2018  . Pruritus, unspecified 10/22/2018  . Sarcoidosis 07/02/2016   Presumptive diagnosis, never biopsied, based on mediastinal lymphadenopathy, nodularity and elevated ACE level  . Secondary cardiomyopathy ([place]) 06/08/2015  . Secondary hyperparathyroidism ([place]) 10/20/2018    Past Surgical History:  Procedure Laterality Date  . AV FISTULA PLACEMENT Left 05/26/2018   Procedure: ARTERIOVENOUS (AV) FISTULA CREATION LEFT ARM;  Surgeon: Marty Heck, MD;  Location: [facility];  Service: Vascular;  Laterality: Left;  . CARDIAC CATHETERIZATION N/A 06/15/2015   Procedure: Left Heart Cath and Coronary Angiography;  Surgeon: Peter M Martinique, MD;  Location: [facility];  Service: Cardiovascular;  Laterality: N/A;  . CHOLECYSTECTOMY N/A 06/28/2017   Procedure: LAPAROSCOPIC CHOLECYSTECTOMY;  Surgeon: Ralene Ok, MD;  Location: [facility];  Service: General;  Laterality: N/A;  . HERNIA REPAIR  7th grade   umbilical  . LEFT HEART CATH AND CORONARY ANGIOGRAPHY N/A 04/28/2019   Procedure: LEFT HEART CATH AND CORONARY ANGIOGRAPHY;  Surgeon: Leonie Man, MD;  Location: [facility];  Service: Cardiovascular;  Laterality: N/A;  . Left Kidney Transplant  07/12/2020    Current Medications: Current Meds  Medication Sig  . albuterol (PROVENTIL) (2.5 MG/3ML) 0.083% nebulizer solution Take 3 mLs (2.5 mg total) by nebulization  every 6 (six) hours as needed for wheezing or shortness of breath.  Marland Kitchen aspirin EC 81 MG tablet Take 81 mg by mouth daily.  . BD PEN NEEDLE NANO 2ND GEN 32G X 4 MM MISC USE DAILY WITH INSULIN (Patient taking differently: 1 application by Other route as needed (Use with insulin).)  . carvedilol (COREG) 6.25 MG tablet Take 1 tablet (6.25 mg total) by mouth 2 (two) times daily with a meal.  . Continuous Blood Gluc Receiver (FREESTYLE LIBRE 14 DAY READER) DEVI USE AS DIRECTED (Patient taking differently: 1 application by Other route as needed (Use with insulin).)  . Continuous Blood Gluc Sensor (FREESTYLE LIBRE 14 DAY SENSOR) MISC 1 Device by Other route every 14 (fourteen) days.  Marland Kitchen glucose blood (ONETOUCH VERIO) test strip 1 each by Other route 2 (two) times daily as needed for other. And lancets 2/day  . hydrALAZINE (APRESOLINE) 25 MG tablet Take 1 tablet (25 mg total) by mouth 3 (three) times daily. Needs appointment for future refill / 1st Attempt  . insulin aspart protamine - aspart (NOVOLOG MIX 70/30 FLEXPEN) (70-30) 100 UNIT/ML FlexPen 20 units in the morning, and 30 with lunch, and pen needles 2/day (Patient taking differently: Inject 20 Units into the skin daily with breakfast. 20 units in the morning, and 30 with lunch, and pen needles 2/day)  . Insulin Glargine (BASAGLAR KWIKPEN) 100 UNIT/ML Inject 38 Units into the skin daily.  . isosorbide mononitrate (IMDUR) 60 MG 24 hr tablet TAKE 1 TABLET(60 MG) BY MOUTH DAILY (Patient taking differently: Take 60 mg by mouth daily.)  . magnesium oxide (MAG-OX) 400 MG tablet Take 2 tablets by mouth daily.  Marland Kitchen NIFEdipine (PROCARDIA-XL/NIFEDICAL-XL) 30 MG 24 hr tablet Take 1 tablet by mouth daily.  . pantoprazole (PROTONIX) 40 MG tablet Take 1 tablet by mouth daily.  . polyethylene glycol powder (GLYCOLAX/MIRALAX) 17 GM/SCOOP powder Take 17 g by mouth as needed for constipation.  . predniSONE (DELTASONE) 5 MG tablet Take 5 mg by mouth daily.  . sevelamer  carbonate (RENVELA) 800 MG tablet Take 2,400 mg by mouth 3 (three) times daily.  Marland Kitchen sulfamethoxazole-trimethoprim (BACTRIM) 400-80 MG tablet Take 1 tablet by mouth 3 (three) times a week. Monday, Wednesday's and Friday  . tacrolimus (PROGRAF) 1 MG capsule Take 7 mg by mouth 2 (two) times daily. Every 12hours  . valGANciclovir (VALCYTE) 450 MG tablet Take 1 tablet by mouth daily at 6 (six) AM.  . VITAMIN D PO Take 1 tablet by mouth daily. Unknown strength  . [DISCONTINUED] rosuvastatin (CRESTOR) 20 MG tablet Take 2 tablets (40 mg total) by mouth daily.     Allergies:   Lipitor [atorvastatin]   Social History   Socioeconomic History  . Marital status: Divorced    Spouse name: n/a  . Number of children: 1  . Years of education: 12+  . Highest education level: Not on file  Occupational History  . Occupation: Building services engineer: [employer]  Tobacco Use  . Smoking status: Never Smoker  . Smokeless tobacco: Never Used  Vaping Use  . Vaping Use: Never used  Substance and Sexual Activity  . Alcohol use: No    Alcohol/week: 0.0 standard drinks  . Drug use: No  . Sexual activity:  Not Currently    Comment: Erectile dysfunciotn  Other Topics Concern  . Not on file  Social History Narrative   Divorced. Education: [employer].    Unable to exercise due to significant SOB and DOE.    Lives alone.   Son lives in [place].   Social Determinants of Health   Financial Resource Strain: Not on file  Food Insecurity: Not on file  Transportation Needs: Not on file  Physical Activity: Not on file  Stress: Not on file  Social Connections: Not on file     Family History: The patient's family history includes Arthritis in his mother and sister; COPD in his mother and sister; Diabetes in his brother and mother; Hypertension in his father and mother. There is no history of Colon cancer. ROS:   Please see the history of present illness.    All 14 point review of systems negative except as  described per history of present illness  EKGs/Labs/Other Studies Reviewed:      Recent Labs: No results found for requested labs within last 8760 hours.  Recent Lipid Panel    Component Value Date/Time   CHOL 197 09/24/2017 0823   CHOL 305 (H) 03/21/2017 1124   TRIG 245.0 (H) 09/24/2017 0823   HDL 45.60 09/24/2017 0823   HDL 60 03/21/2017 1124   CHOLHDL 4 09/24/2017 0823   VLDL 49.0 (H) 09/24/2017 0823   LDLCALC 212 (H) 03/21/2017 1124   LDLDIRECT 106.0 09/24/2017 0823    Physical Exam:    VS:  BP 138/70 (BP Location: Right Arm, Patient Position: Sitting)   Pulse 83   Ht '5\' 9"'$  (1.753 m)   Wt 185 lb (83.9 kg)   SpO2 97%   BMI 27.32 kg/m     Wt Readings from Last 3 Encounters:  08/18/20 185 lb (83.9 kg)  06/16/20 187 lb 6.4 oz (85 kg)  03/10/20 183 lb (83 kg)     GEN:  Well nourished, well developed in no acute distress HEENT: Normal NECK: No JVD; No carotid bruits LYMPHATICS: No lymphadenopathy CARDIAC: RRR, no murmurs, no rubs, no gallops RESPIRATORY:  Clear to auscultation without rales, wheezing or rhonchi  ABDOMEN: Soft, non-tender, non-distended MUSCULOSKELETAL:  No edema; No deformity  SKIN: Warm and dry LOWER EXTREMITIES: no swelling NEUROLOGIC:  Alert and oriented x 3 PSYCHIATRIC:  Normal affect   ASSESSMENT:    1. Coronary artery disease involving native coronary artery of native heart without angina pectoris   2. Essential hypertension   3. Secondary cardiomyopathy ([place])   4. Type 2 diabetes mellitus with stage 4 chronic kidney disease, with long-term current use of insulin (HCC)    PLAN:    In order of problems listed above:  1. Coronary disease cardiac catheterization reviewed.  He is on antiplatelet therapy which I will continue.  For some reason he is off Crestor I suspect that is because of recent kidney transplant.  He need to be on statin.  Will recheck his cholesterol within the next few months and then restart him on statin if it is  fine from kidney transplant point of view. 2. Essential hypertension blood pressure slightly elevated today.  But he is being having medication adjusted by his transplant team. 3. History of congestive heart failure.  He seems to be compensated right now continue present management. 4. Overall he is doing terrific feeling great after kidney transplant.  I did review record from hospitalization for surgery for kidney transplant   Medication Adjustments/Labs and Tests  Ordered: Current medicines are reviewed at length with the patient today.  Concerns regarding medicines are outlined above.  No orders of the defined types were placed in this encounter.  Medication changes: No orders of the defined types were placed in this encounter.   Signed, Park Liter, MD, [facility] 08/18/2020 1:32 PM    [place]

## 2020-08-23 ENCOUNTER — Other Ambulatory Visit: Payer: Self-pay | Admitting: Cardiology

## 2020-08-23 NOTE — Telephone Encounter (Signed)
Imdur approved and sent

## 2020-08-28 ENCOUNTER — Other Ambulatory Visit: Payer: Self-pay | Admitting: Cardiology

## 2020-08-29 NOTE — Telephone Encounter (Signed)
Hydralazine 25 mg # 270 tablets to pharmacy

## 2020-09-20 ENCOUNTER — Ambulatory Visit: Payer: Medicare Other | Admitting: Endocrinology

## 2020-09-27 ENCOUNTER — Ambulatory Visit: Payer: Managed Care, Other (non HMO) | Admitting: Podiatry

## 2020-09-29 DIAGNOSIS — D849 Immunodeficiency, unspecified: Secondary | ICD-10-CM | POA: Diagnosis not present

## 2020-09-29 DIAGNOSIS — Z940 Kidney transplant status: Secondary | ICD-10-CM | POA: Diagnosis not present

## 2020-10-13 DIAGNOSIS — Z940 Kidney transplant status: Secondary | ICD-10-CM | POA: Diagnosis not present

## 2020-10-13 DIAGNOSIS — D649 Anemia, unspecified: Secondary | ICD-10-CM | POA: Diagnosis not present

## 2020-10-13 DIAGNOSIS — D849 Immunodeficiency, unspecified: Secondary | ICD-10-CM | POA: Diagnosis not present

## 2020-10-20 DIAGNOSIS — I10 Essential (primary) hypertension: Secondary | ICD-10-CM | POA: Diagnosis not present

## 2020-10-20 DIAGNOSIS — D649 Anemia, unspecified: Secondary | ICD-10-CM | POA: Diagnosis not present

## 2020-10-20 DIAGNOSIS — Z4822 Encounter for aftercare following kidney transplant: Secondary | ICD-10-CM | POA: Diagnosis not present

## 2020-10-20 DIAGNOSIS — Z794 Long term (current) use of insulin: Secondary | ICD-10-CM | POA: Diagnosis not present

## 2020-10-20 DIAGNOSIS — E119 Type 2 diabetes mellitus without complications: Secondary | ICD-10-CM | POA: Diagnosis not present

## 2020-10-20 DIAGNOSIS — E1121 Type 2 diabetes mellitus with diabetic nephropathy: Secondary | ICD-10-CM | POA: Diagnosis not present

## 2020-10-20 DIAGNOSIS — Z940 Kidney transplant status: Secondary | ICD-10-CM | POA: Diagnosis not present

## 2020-10-20 DIAGNOSIS — D849 Immunodeficiency, unspecified: Secondary | ICD-10-CM | POA: Diagnosis not present

## 2020-10-20 DIAGNOSIS — E785 Hyperlipidemia, unspecified: Secondary | ICD-10-CM | POA: Diagnosis not present

## 2020-10-20 DIAGNOSIS — Z79899 Other long term (current) drug therapy: Secondary | ICD-10-CM | POA: Diagnosis not present

## 2020-10-25 DIAGNOSIS — E1121 Type 2 diabetes mellitus with diabetic nephropathy: Secondary | ICD-10-CM | POA: Diagnosis not present

## 2020-10-25 DIAGNOSIS — Z940 Kidney transplant status: Secondary | ICD-10-CM | POA: Diagnosis not present

## 2020-10-25 DIAGNOSIS — D849 Immunodeficiency, unspecified: Secondary | ICD-10-CM | POA: Diagnosis not present

## 2020-10-25 DIAGNOSIS — Z794 Long term (current) use of insulin: Secondary | ICD-10-CM | POA: Diagnosis not present

## 2020-11-03 DIAGNOSIS — E1122 Type 2 diabetes mellitus with diabetic chronic kidney disease: Secondary | ICD-10-CM | POA: Diagnosis not present

## 2020-11-03 DIAGNOSIS — I151 Hypertension secondary to other renal disorders: Secondary | ICD-10-CM | POA: Diagnosis not present

## 2020-11-03 DIAGNOSIS — N186 End stage renal disease: Secondary | ICD-10-CM | POA: Diagnosis not present

## 2020-11-03 DIAGNOSIS — Z888 Allergy status to other drugs, medicaments and biological substances status: Secondary | ICD-10-CM | POA: Diagnosis not present

## 2020-11-03 DIAGNOSIS — Z4822 Encounter for aftercare following kidney transplant: Secondary | ICD-10-CM | POA: Diagnosis not present

## 2020-11-03 DIAGNOSIS — N2889 Other specified disorders of kidney and ureter: Secondary | ICD-10-CM | POA: Diagnosis not present

## 2020-11-03 DIAGNOSIS — E785 Hyperlipidemia, unspecified: Secondary | ICD-10-CM | POA: Diagnosis not present

## 2020-11-03 DIAGNOSIS — I120 Hypertensive chronic kidney disease with stage 5 chronic kidney disease or end stage renal disease: Secondary | ICD-10-CM | POA: Diagnosis not present

## 2020-11-03 DIAGNOSIS — Z79899 Other long term (current) drug therapy: Secondary | ICD-10-CM | POA: Diagnosis not present

## 2020-11-03 DIAGNOSIS — Z794 Long term (current) use of insulin: Secondary | ICD-10-CM | POA: Diagnosis not present

## 2020-11-10 DIAGNOSIS — I151 Hypertension secondary to other renal disorders: Secondary | ICD-10-CM | POA: Diagnosis not present

## 2020-11-10 DIAGNOSIS — D849 Immunodeficiency, unspecified: Secondary | ICD-10-CM | POA: Diagnosis not present

## 2020-11-10 DIAGNOSIS — Z940 Kidney transplant status: Secondary | ICD-10-CM | POA: Diagnosis not present

## 2020-11-10 DIAGNOSIS — N2889 Other specified disorders of kidney and ureter: Secondary | ICD-10-CM | POA: Diagnosis not present

## 2020-11-17 DIAGNOSIS — I10 Essential (primary) hypertension: Secondary | ICD-10-CM | POA: Diagnosis not present

## 2020-11-17 DIAGNOSIS — Z792 Long term (current) use of antibiotics: Secondary | ICD-10-CM | POA: Diagnosis not present

## 2020-11-17 DIAGNOSIS — Z794 Long term (current) use of insulin: Secondary | ICD-10-CM | POA: Diagnosis not present

## 2020-11-17 DIAGNOSIS — E1121 Type 2 diabetes mellitus with diabetic nephropathy: Secondary | ICD-10-CM | POA: Diagnosis not present

## 2020-11-17 DIAGNOSIS — E785 Hyperlipidemia, unspecified: Secondary | ICD-10-CM | POA: Diagnosis not present

## 2020-11-17 DIAGNOSIS — Z4822 Encounter for aftercare following kidney transplant: Secondary | ICD-10-CM | POA: Diagnosis not present

## 2020-11-17 DIAGNOSIS — E119 Type 2 diabetes mellitus without complications: Secondary | ICD-10-CM | POA: Diagnosis not present

## 2020-11-17 DIAGNOSIS — D849 Immunodeficiency, unspecified: Secondary | ICD-10-CM | POA: Diagnosis not present

## 2020-11-17 DIAGNOSIS — D649 Anemia, unspecified: Secondary | ICD-10-CM | POA: Diagnosis not present

## 2020-11-17 DIAGNOSIS — D72819 Decreased white blood cell count, unspecified: Secondary | ICD-10-CM | POA: Diagnosis not present

## 2020-11-17 DIAGNOSIS — Z940 Kidney transplant status: Secondary | ICD-10-CM | POA: Diagnosis not present

## 2020-11-17 DIAGNOSIS — Z79899 Other long term (current) drug therapy: Secondary | ICD-10-CM | POA: Diagnosis not present

## 2020-11-17 DIAGNOSIS — Z7952 Long term (current) use of systemic steroids: Secondary | ICD-10-CM | POA: Diagnosis not present

## 2020-11-23 ENCOUNTER — Encounter: Payer: Self-pay | Admitting: Podiatry

## 2020-11-23 ENCOUNTER — Other Ambulatory Visit: Payer: Self-pay

## 2020-11-23 ENCOUNTER — Ambulatory Visit (INDEPENDENT_AMBULATORY_CARE_PROVIDER_SITE_OTHER): Payer: Managed Care, Other (non HMO) | Admitting: Podiatry

## 2020-11-23 DIAGNOSIS — L84 Corns and callosities: Secondary | ICD-10-CM

## 2020-11-23 DIAGNOSIS — E1169 Type 2 diabetes mellitus with other specified complication: Secondary | ICD-10-CM | POA: Diagnosis not present

## 2020-11-23 DIAGNOSIS — Z940 Kidney transplant status: Secondary | ICD-10-CM | POA: Diagnosis not present

## 2020-11-23 DIAGNOSIS — B351 Tinea unguium: Secondary | ICD-10-CM

## 2020-11-23 DIAGNOSIS — E785 Hyperlipidemia, unspecified: Secondary | ICD-10-CM | POA: Diagnosis not present

## 2020-11-23 DIAGNOSIS — M79676 Pain in unspecified toe(s): Secondary | ICD-10-CM

## 2020-11-26 NOTE — Progress Notes (Signed)
  Subjective:  Patient ID: Brandon Carroll, male    DOB: [birth year],  MRN: [id number]  60 y.o. male presents with at risk foot care. Pt has h/o NIDDM with chronic kidney disease.    Patient's blood sugar was 118 mg/dl today.  PCP: Hoyt Koch, MD and last visit was: 06/12/2019. He is also followed by Dr. Renato Shin for his diabetes and last visit was 06/16/2020.  Patient states he has received a deceased donor kidney in 07-Aug-2022. He states he feels much better and is happy to be off of dialysis. He voices no new pedal problems on today's visit. He states his blood sugars have been running high due to him taking prednisone.  Review of Systems: Negative except as noted in the HPI.   Allergies  Allergen Reactions   Lipitor [Atorvastatin] Hives and Itching    Objective:  There were no vitals filed for this visit. Constitutional Patient is a pleasant 60 y.o. African American male WD, WN in NAD. AAO x 3.  Vascular Capillary refill time to digits immediate b/l. Palpable pedal pulses b/l LE. Pedal hair present. Lower extremity skin temperature gradient within normal limits. No pain with calf compression b/l. No edema noted b/l lower extremities. No cyanosis or clubbing noted.  Neurologic Normal speech. Protective sensation intact 5/5 intact bilaterally with 10g monofilament b/l. Vibratory sensation intact b/l.  Dermatologic Pedal skin with normal turgor, texture and tone b/l lower extremities. No open wounds b/l lower extremities. No interdigital macerations b/l lower extremities. Toenails 1-5 b/l elongated, discolored, dystrophic, thickened, crumbly with subungual debris and tenderness to dorsal palpation. Hyperkeratotic lesion(s) sub 5th met base b/l feet and plantar aspect b/l heel pads.  No erythema, no edema, no drainage, no fluctuance.  Orthopedic: Normal muscle strength 5/5 to all lower extremity muscle groups bilaterally. No pain crepitus or joint limitation noted with ROM b/l.  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   Hemoglobin A1C Latest Ref Rng & Units 06/16/2020 03/10/2020 01/07/2020  HGBA1C 4.0 - 5.6 % 7.7(A) 7.6(A) 8.2(A)  Some recent data might be hidden   Last A1c 8.4% on 10/25/2020:   Ref Range & Units 1 mo ago  HEMOGLOBIN A1C <5.8 % 8.4 High    Comment: The reference ranges follow the ADA recommendations:      - Normal A1c: Less than 5.8%      - Prediabetes range A1c: 5.8-6.4%      - Diabetes range A1c: Above 6.4%   Assessment:   1. Pain due to onychomycosis of toenail   2. Callus   3. Deceased-donor kidney transplant recipient   4. Type 2 diabetes mellitus with hyperlipidemia (HCC)    Plan:   -No new findings. No new orders. -Continue diabetic foot care principles. -Patient to continue soft, supportive shoe gear daily. -Toenails 1-5 b/l were debrided in length and girth with sterile nail nippers and dremel without iatrogenic bleeding.  -Callus(es) sub 5th met base b/l feet and plantar aspect b/l heel pads pared utilizing sterile scalpel blade without complication or incident. Total number debrided =4. -Patient to report any pedal injuries to medical professional immediately. -Patient/POA to call should there be question/concern in the interim.  Return in about 3 months (around 02/23/2021).  Marzetta Board, DPM

## 2020-11-30 ENCOUNTER — Other Ambulatory Visit: Payer: Self-pay | Admitting: Cardiology

## 2020-11-30 NOTE — Telephone Encounter (Signed)
Rx sent 

## 2020-12-01 ENCOUNTER — Ambulatory Visit: Payer: Managed Care, Other (non HMO) | Admitting: Cardiology

## 2020-12-02 ENCOUNTER — Encounter: Payer: Self-pay | Admitting: Cardiology

## 2020-12-08 DIAGNOSIS — Z01818 Encounter for other preprocedural examination: Secondary | ICD-10-CM | POA: Diagnosis not present

## 2020-12-08 DIAGNOSIS — Z940 Kidney transplant status: Secondary | ICD-10-CM | POA: Diagnosis not present

## 2020-12-08 DIAGNOSIS — D849 Immunodeficiency, unspecified: Secondary | ICD-10-CM | POA: Diagnosis not present

## 2021-01-17 DIAGNOSIS — Z4822 Encounter for aftercare following kidney transplant: Secondary | ICD-10-CM | POA: Diagnosis not present

## 2021-01-17 DIAGNOSIS — Z794 Long term (current) use of insulin: Secondary | ICD-10-CM | POA: Diagnosis not present

## 2021-01-17 DIAGNOSIS — Z7952 Long term (current) use of systemic steroids: Secondary | ICD-10-CM | POA: Diagnosis not present

## 2021-01-17 DIAGNOSIS — D649 Anemia, unspecified: Secondary | ICD-10-CM | POA: Diagnosis not present

## 2021-01-17 DIAGNOSIS — Z79899 Other long term (current) drug therapy: Secondary | ICD-10-CM | POA: Diagnosis not present

## 2021-01-17 DIAGNOSIS — E119 Type 2 diabetes mellitus without complications: Secondary | ICD-10-CM | POA: Diagnosis not present

## 2021-01-17 DIAGNOSIS — I10 Essential (primary) hypertension: Secondary | ICD-10-CM | POA: Diagnosis not present

## 2021-01-17 DIAGNOSIS — E785 Hyperlipidemia, unspecified: Secondary | ICD-10-CM | POA: Diagnosis not present

## 2021-01-17 DIAGNOSIS — Z888 Allergy status to other drugs, medicaments and biological substances status: Secondary | ICD-10-CM | POA: Diagnosis not present

## 2021-01-17 DIAGNOSIS — Z792 Long term (current) use of antibiotics: Secondary | ICD-10-CM | POA: Diagnosis not present

## 2021-01-17 DIAGNOSIS — D72819 Decreased white blood cell count, unspecified: Secondary | ICD-10-CM | POA: Diagnosis not present

## 2021-01-27 ENCOUNTER — Other Ambulatory Visit: Payer: Self-pay

## 2021-01-27 ENCOUNTER — Ambulatory Visit (INDEPENDENT_AMBULATORY_CARE_PROVIDER_SITE_OTHER): Payer: Managed Care, Other (non HMO) | Admitting: Internal Medicine

## 2021-01-27 ENCOUNTER — Encounter: Payer: Self-pay | Admitting: Internal Medicine

## 2021-01-27 VITALS — BP 132/80 | HR 75 | Temp 98.5°F | Resp 18 | Ht 69.0 in | Wt 198.6 lb

## 2021-01-27 DIAGNOSIS — E785 Hyperlipidemia, unspecified: Secondary | ICD-10-CM

## 2021-01-27 DIAGNOSIS — Z794 Long term (current) use of insulin: Secondary | ICD-10-CM | POA: Diagnosis not present

## 2021-01-27 DIAGNOSIS — Z23 Encounter for immunization: Secondary | ICD-10-CM

## 2021-01-27 DIAGNOSIS — E1169 Type 2 diabetes mellitus with other specified complication: Secondary | ICD-10-CM

## 2021-01-27 DIAGNOSIS — D849 Immunodeficiency, unspecified: Secondary | ICD-10-CM

## 2021-01-27 DIAGNOSIS — E1121 Type 2 diabetes mellitus with diabetic nephropathy: Secondary | ICD-10-CM | POA: Diagnosis not present

## 2021-01-27 DIAGNOSIS — I10 Essential (primary) hypertension: Secondary | ICD-10-CM

## 2021-01-27 DIAGNOSIS — M542 Cervicalgia: Secondary | ICD-10-CM

## 2021-01-27 DIAGNOSIS — Z0001 Encounter for general adult medical examination with abnormal findings: Secondary | ICD-10-CM

## 2021-01-27 DIAGNOSIS — I5022 Chronic systolic (congestive) heart failure: Secondary | ICD-10-CM

## 2021-01-27 NOTE — Patient Instructions (Addendum)
The neck is likely muscular and you can keep using tylenol and biofreeze patches and the heat.  If this is not getting better in 2 weeks let us know.

## 2021-01-27 NOTE — Progress Notes (Signed)
   Subjective:   Patient ID: Brandon Carroll, male    DOB: 07-19-1960, 60 y.o.   MRN: [id number]  HPI The patient is a 60 YO man coming in for neck pain. Started after long plane ride.  Desires physical as well.   PMH, [facility], social history reviewed and updated  Review of Systems  Constitutional: Negative.   HENT: Negative.    Eyes: Negative.   Respiratory:  Negative for cough, chest tightness and shortness of breath.   Cardiovascular:  Negative for chest pain, palpitations and leg swelling.  Gastrointestinal:  Negative for abdominal distention, abdominal pain, constipation, diarrhea, nausea and vomiting.  Musculoskeletal:  Positive for neck pain. Negative for neck stiffness.  Skin: Negative.   Neurological: Negative.  Negative for weakness and numbness.  Psychiatric/Behavioral: Negative.     Objective:  Physical Exam Constitutional:      Appearance: He is well-developed.  HENT:     Head: Normocephalic and atraumatic.  Cardiovascular:     Rate and Rhythm: Normal rate and regular rhythm.  Pulmonary:     Effort: Pulmonary effort is normal. No respiratory distress.     Breath sounds: Normal breath sounds. No wheezing or rales.  Abdominal:     General: Bowel sounds are normal. There is no distension.     Palpations: Abdomen is soft.     Tenderness: There is no abdominal tenderness. There is no rebound.  Musculoskeletal:        General: Tenderness present.     Cervical back: Normal range of motion.     Comments: Right lateral neck with tenderness  Skin:    General: Skin is warm and dry.  Neurological:     Mental Status: He is alert and oriented to person, place, and time.     Coordination: Coordination normal.    Vitals:   01/27/21 0851  BP: 132/80  Pulse: 75  Resp: 18  Temp: 98.5 F (36.9 C)  TempSrc: Oral  SpO2: 98%  Weight: 198 lb 9.6 oz (90.1 kg)  Height: '5\' 9"'$  (1.753 m)    This visit occurred during the SARS-CoV-2 public health emergency.  Safety  protocols were in place, including screening questions prior to the visit, additional usage of staff PPE, and extensive cleaning of exam room while observing appropriate contact time as indicated for disinfecting solutions.   Assessment & Plan:  Flu shot given at visit

## 2021-01-27 NOTE — Assessment & Plan Note (Signed)
Recent lipid panel done through transplant center at goal without statin.

## 2021-01-27 NOTE — Assessment & Plan Note (Signed)
No flare today.  

## 2021-01-27 NOTE — Assessment & Plan Note (Signed)
Due to renal transplant.

## 2021-01-27 NOTE — Assessment & Plan Note (Signed)
BP at goal and medications are managed by transplant team currently.

## 2021-01-27 NOTE — Assessment & Plan Note (Signed)
Recent HgA1c 8 and he is seeing endo soon. Given recent renal transplant he has been managed by them for diabetes recently.

## 2021-01-27 NOTE — Assessment & Plan Note (Signed)
Advised tylenol, heating compress and biofreeze to help. Likely muscular. No red flag signs. No imaging needed.

## 2021-01-27 NOTE — Assessment & Plan Note (Signed)
Flu shot given. Covid-19 booster recommended. Pneumonia up to date. Shingrix counseled. Tetanus up to date. Colonoscopy up to date. Counseled about sun safety and mole surveillance. Counseled about the dangers of distracted driving. Given 10 year screening recommendations.

## 2021-02-10 DIAGNOSIS — D72819 Decreased white blood cell count, unspecified: Secondary | ICD-10-CM | POA: Diagnosis not present

## 2021-02-10 DIAGNOSIS — Z79899 Other long term (current) drug therapy: Secondary | ICD-10-CM | POA: Diagnosis not present

## 2021-02-10 DIAGNOSIS — R197 Diarrhea, unspecified: Secondary | ICD-10-CM | POA: Diagnosis not present

## 2021-02-10 DIAGNOSIS — Z940 Kidney transplant status: Secondary | ICD-10-CM | POA: Diagnosis not present

## 2021-02-10 DIAGNOSIS — R112 Nausea with vomiting, unspecified: Secondary | ICD-10-CM | POA: Diagnosis not present

## 2021-02-10 DIAGNOSIS — R509 Fever, unspecified: Secondary | ICD-10-CM | POA: Diagnosis not present

## 2021-02-10 DIAGNOSIS — E1121 Type 2 diabetes mellitus with diabetic nephropathy: Secondary | ICD-10-CM | POA: Diagnosis not present

## 2021-02-10 DIAGNOSIS — T8610 Unspecified complication of kidney transplant: Secondary | ICD-10-CM | POA: Diagnosis not present

## 2021-02-10 DIAGNOSIS — Z794 Long term (current) use of insulin: Secondary | ICD-10-CM | POA: Diagnosis not present

## 2021-02-10 DIAGNOSIS — I10 Essential (primary) hypertension: Secondary | ICD-10-CM | POA: Diagnosis not present

## 2021-02-10 DIAGNOSIS — Z7952 Long term (current) use of systemic steroids: Secondary | ICD-10-CM | POA: Diagnosis not present

## 2021-02-10 DIAGNOSIS — N179 Acute kidney failure, unspecified: Secondary | ICD-10-CM | POA: Diagnosis not present

## 2021-02-10 DIAGNOSIS — E119 Type 2 diabetes mellitus without complications: Secondary | ICD-10-CM | POA: Diagnosis not present

## 2021-02-10 DIAGNOSIS — R059 Cough, unspecified: Secondary | ICD-10-CM | POA: Diagnosis not present

## 2021-02-10 DIAGNOSIS — Z20822 Contact with and (suspected) exposure to covid-19: Secondary | ICD-10-CM | POA: Diagnosis not present

## 2021-02-10 DIAGNOSIS — D849 Immunodeficiency, unspecified: Secondary | ICD-10-CM | POA: Diagnosis not present

## 2021-02-10 DIAGNOSIS — E785 Hyperlipidemia, unspecified: Secondary | ICD-10-CM | POA: Diagnosis not present

## 2021-02-11 DIAGNOSIS — Z794 Long term (current) use of insulin: Secondary | ICD-10-CM | POA: Diagnosis not present

## 2021-02-11 DIAGNOSIS — Z20822 Contact with and (suspected) exposure to covid-19: Secondary | ICD-10-CM | POA: Diagnosis not present

## 2021-02-11 DIAGNOSIS — E1121 Type 2 diabetes mellitus with diabetic nephropathy: Secondary | ICD-10-CM | POA: Diagnosis not present

## 2021-02-11 DIAGNOSIS — D849 Immunodeficiency, unspecified: Secondary | ICD-10-CM | POA: Diagnosis not present

## 2021-02-11 DIAGNOSIS — Z4822 Encounter for aftercare following kidney transplant: Secondary | ICD-10-CM | POA: Diagnosis not present

## 2021-02-11 DIAGNOSIS — E785 Hyperlipidemia, unspecified: Secondary | ICD-10-CM | POA: Diagnosis not present

## 2021-02-11 DIAGNOSIS — Z5181 Encounter for therapeutic drug level monitoring: Secondary | ICD-10-CM | POA: Diagnosis not present

## 2021-02-11 DIAGNOSIS — T8610 Unspecified complication of kidney transplant: Secondary | ICD-10-CM | POA: Diagnosis not present

## 2021-02-11 DIAGNOSIS — R112 Nausea with vomiting, unspecified: Secondary | ICD-10-CM | POA: Diagnosis not present

## 2021-02-11 DIAGNOSIS — D72819 Decreased white blood cell count, unspecified: Secondary | ICD-10-CM | POA: Diagnosis not present

## 2021-02-11 DIAGNOSIS — N179 Acute kidney failure, unspecified: Secondary | ICD-10-CM | POA: Diagnosis not present

## 2021-02-11 DIAGNOSIS — E119 Type 2 diabetes mellitus without complications: Secondary | ICD-10-CM | POA: Diagnosis not present

## 2021-02-11 DIAGNOSIS — R197 Diarrhea, unspecified: Secondary | ICD-10-CM | POA: Diagnosis not present

## 2021-02-11 DIAGNOSIS — Z7952 Long term (current) use of systemic steroids: Secondary | ICD-10-CM | POA: Diagnosis not present

## 2021-02-11 DIAGNOSIS — Z79899 Other long term (current) drug therapy: Secondary | ICD-10-CM | POA: Diagnosis not present

## 2021-02-11 DIAGNOSIS — I10 Essential (primary) hypertension: Secondary | ICD-10-CM | POA: Diagnosis not present

## 2021-02-12 DIAGNOSIS — D72819 Decreased white blood cell count, unspecified: Secondary | ICD-10-CM | POA: Diagnosis not present

## 2021-02-12 DIAGNOSIS — E785 Hyperlipidemia, unspecified: Secondary | ICD-10-CM | POA: Diagnosis not present

## 2021-02-12 DIAGNOSIS — Z794 Long term (current) use of insulin: Secondary | ICD-10-CM | POA: Diagnosis not present

## 2021-02-12 DIAGNOSIS — Z7952 Long term (current) use of systemic steroids: Secondary | ICD-10-CM | POA: Diagnosis not present

## 2021-02-12 DIAGNOSIS — R112 Nausea with vomiting, unspecified: Secondary | ICD-10-CM | POA: Diagnosis not present

## 2021-02-12 DIAGNOSIS — Z20822 Contact with and (suspected) exposure to covid-19: Secondary | ICD-10-CM | POA: Diagnosis not present

## 2021-02-12 DIAGNOSIS — N179 Acute kidney failure, unspecified: Secondary | ICD-10-CM | POA: Diagnosis not present

## 2021-02-12 DIAGNOSIS — R197 Diarrhea, unspecified: Secondary | ICD-10-CM | POA: Diagnosis not present

## 2021-02-12 DIAGNOSIS — I10 Essential (primary) hypertension: Secondary | ICD-10-CM | POA: Diagnosis not present

## 2021-02-12 DIAGNOSIS — E119 Type 2 diabetes mellitus without complications: Secondary | ICD-10-CM | POA: Diagnosis not present

## 2021-02-12 DIAGNOSIS — Z79899 Other long term (current) drug therapy: Secondary | ICD-10-CM | POA: Diagnosis not present

## 2021-02-12 DIAGNOSIS — T8610 Unspecified complication of kidney transplant: Secondary | ICD-10-CM | POA: Diagnosis not present

## 2021-02-14 DIAGNOSIS — R1110 Vomiting, unspecified: Secondary | ICD-10-CM | POA: Diagnosis not present

## 2021-02-14 DIAGNOSIS — Z79899 Other long term (current) drug therapy: Secondary | ICD-10-CM | POA: Diagnosis not present

## 2021-02-14 DIAGNOSIS — I10 Essential (primary) hypertension: Secondary | ICD-10-CM | POA: Diagnosis not present

## 2021-02-14 DIAGNOSIS — Z7952 Long term (current) use of systemic steroids: Secondary | ICD-10-CM | POA: Diagnosis not present

## 2021-02-14 DIAGNOSIS — Z792 Long term (current) use of antibiotics: Secondary | ICD-10-CM | POA: Diagnosis not present

## 2021-02-14 DIAGNOSIS — E869 Volume depletion, unspecified: Secondary | ICD-10-CM | POA: Insufficient documentation

## 2021-02-14 DIAGNOSIS — B259 Cytomegaloviral disease, unspecified: Secondary | ICD-10-CM | POA: Diagnosis not present

## 2021-02-14 DIAGNOSIS — D72819 Decreased white blood cell count, unspecified: Secondary | ICD-10-CM | POA: Diagnosis not present

## 2021-02-14 DIAGNOSIS — D649 Anemia, unspecified: Secondary | ICD-10-CM | POA: Diagnosis not present

## 2021-02-14 DIAGNOSIS — Z940 Kidney transplant status: Secondary | ICD-10-CM | POA: Diagnosis not present

## 2021-02-14 DIAGNOSIS — Z6822 Body mass index (BMI) 22.0-22.9, adult: Secondary | ICD-10-CM | POA: Diagnosis not present

## 2021-02-14 DIAGNOSIS — Z794 Long term (current) use of insulin: Secondary | ICD-10-CM | POA: Diagnosis not present

## 2021-02-14 DIAGNOSIS — E1165 Type 2 diabetes mellitus with hyperglycemia: Secondary | ICD-10-CM | POA: Diagnosis not present

## 2021-02-14 DIAGNOSIS — R634 Abnormal weight loss: Secondary | ICD-10-CM | POA: Diagnosis not present

## 2021-02-14 DIAGNOSIS — R197 Diarrhea, unspecified: Secondary | ICD-10-CM | POA: Diagnosis not present

## 2021-02-14 DIAGNOSIS — E785 Hyperlipidemia, unspecified: Secondary | ICD-10-CM | POA: Diagnosis not present

## 2021-02-14 DIAGNOSIS — Z4822 Encounter for aftercare following kidney transplant: Secondary | ICD-10-CM | POA: Diagnosis not present

## 2021-02-14 DIAGNOSIS — Z9109 Other allergy status, other than to drugs and biological substances: Secondary | ICD-10-CM | POA: Diagnosis not present

## 2021-02-14 DIAGNOSIS — D849 Immunodeficiency, unspecified: Secondary | ICD-10-CM | POA: Diagnosis not present

## 2021-02-20 ENCOUNTER — Ambulatory Visit: Payer: Managed Care, Other (non HMO) | Admitting: Endocrinology

## 2021-02-21 DIAGNOSIS — D649 Anemia, unspecified: Secondary | ICD-10-CM | POA: Diagnosis not present

## 2021-02-21 DIAGNOSIS — I10 Essential (primary) hypertension: Secondary | ICD-10-CM | POA: Diagnosis not present

## 2021-02-21 DIAGNOSIS — Z888 Allergy status to other drugs, medicaments and biological substances status: Secondary | ICD-10-CM | POA: Diagnosis not present

## 2021-02-21 DIAGNOSIS — Z79899 Other long term (current) drug therapy: Secondary | ICD-10-CM | POA: Diagnosis not present

## 2021-02-21 DIAGNOSIS — Z5181 Encounter for therapeutic drug level monitoring: Secondary | ICD-10-CM | POA: Diagnosis not present

## 2021-02-21 DIAGNOSIS — R197 Diarrhea, unspecified: Secondary | ICD-10-CM | POA: Diagnosis not present

## 2021-02-21 DIAGNOSIS — E119 Type 2 diabetes mellitus without complications: Secondary | ICD-10-CM | POA: Diagnosis not present

## 2021-02-21 DIAGNOSIS — Z79621 Long term (current) use of calcineurin inhibitor: Secondary | ICD-10-CM | POA: Diagnosis not present

## 2021-02-21 DIAGNOSIS — D849 Immunodeficiency, unspecified: Secondary | ICD-10-CM | POA: Diagnosis not present

## 2021-02-21 DIAGNOSIS — M542 Cervicalgia: Secondary | ICD-10-CM | POA: Diagnosis not present

## 2021-02-21 DIAGNOSIS — D72819 Decreased white blood cell count, unspecified: Secondary | ICD-10-CM | POA: Diagnosis not present

## 2021-02-21 DIAGNOSIS — Z7952 Long term (current) use of systemic steroids: Secondary | ICD-10-CM | POA: Diagnosis not present

## 2021-02-21 DIAGNOSIS — Z794 Long term (current) use of insulin: Secondary | ICD-10-CM | POA: Diagnosis not present

## 2021-02-21 DIAGNOSIS — B259 Cytomegaloviral disease, unspecified: Secondary | ICD-10-CM | POA: Diagnosis not present

## 2021-02-21 DIAGNOSIS — B349 Viral infection, unspecified: Secondary | ICD-10-CM | POA: Diagnosis not present

## 2021-02-21 DIAGNOSIS — R1110 Vomiting, unspecified: Secondary | ICD-10-CM | POA: Diagnosis not present

## 2021-02-21 DIAGNOSIS — Z4822 Encounter for aftercare following kidney transplant: Secondary | ICD-10-CM | POA: Diagnosis not present

## 2021-02-21 DIAGNOSIS — Z792 Long term (current) use of antibiotics: Secondary | ICD-10-CM | POA: Diagnosis not present

## 2021-02-21 DIAGNOSIS — Z7984 Long term (current) use of oral hypoglycemic drugs: Secondary | ICD-10-CM | POA: Diagnosis not present

## 2021-02-21 DIAGNOSIS — Z940 Kidney transplant status: Secondary | ICD-10-CM | POA: Diagnosis not present

## 2021-02-24 ENCOUNTER — Other Ambulatory Visit: Payer: Self-pay

## 2021-02-24 ENCOUNTER — Ambulatory Visit (INDEPENDENT_AMBULATORY_CARE_PROVIDER_SITE_OTHER): Payer: Medicare Other | Admitting: Endocrinology

## 2021-02-24 VITALS — BP 140/78 | HR 79 | Ht 69.0 in | Wt 187.6 lb

## 2021-02-24 DIAGNOSIS — E1121 Type 2 diabetes mellitus with diabetic nephropathy: Secondary | ICD-10-CM

## 2021-02-24 DIAGNOSIS — Z794 Long term (current) use of insulin: Secondary | ICD-10-CM

## 2021-02-24 LAB — POCT GLYCOSYLATED HEMOGLOBIN (HGB A1C): Hemoglobin A1C: 9.2 % — AB (ref 4.0–5.6)

## 2021-02-24 MED ORDER — NOVOLIN N FLEXPEN 100 UNIT/ML ~~LOC~~ SUPN: 70.0000 [IU] | PEN_INJECTOR | SUBCUTANEOUS | 3 refills | Status: DC

## 2021-02-24 MED ORDER — FREESTYLE LIBRE 2 READER DEVI: 1.0000 | Freq: Once | 1 refills | Status: AC

## 2021-02-24 NOTE — Progress Notes (Signed)
Subjective:    Patient ID: Brandon Carroll, male    DOB: [birth year], 60 y.o.   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   Dx'ed: 99991111 Complications: ESRD (transplant 2022).   Therapy: insulin since 2014.   DKA: never.  Severe hypoglycemia: once (early 2020).  Pancreatitis: never.  SDOH: he is on BID insulin, due to h/o noncompliance; he takes HD mornings of MWF.  Other: fructosamine converts to slightly higher A1c than cbg's and A1c itself; he changed Lantus to NPH, then 75/25, due to pattern of cbg's.   Interval history: we are again unable to access continuous glucose monitor data.  Pt says glucose varies from 75-250.  It is in general higher as the day goes on.  Pt says he never misses the insulin. He recently got renal transplant.  He stopped Ozempic (abd cramps).  He takes Lantus 42 units QHS, and Humalog 10-12 units 3 times a day (just before each meal).   Past Medical History:  Diagnosis Date   A-V fistula ([place]) 03/12/2019   Abnormal CT of the chest 06/08/2015   Abnormal echocardiogram 123456   Acute systolic CHF (congestive heart failure), NYHA class 2 ([place]) 05/04/2015   Acute-on-chronic kidney injury ([place]) 05/04/2015   Anemia 06/06/2015   Anemia in chronic kidney disease 11/18/2018   Body mass index (BMI) 27.0-27.9, adult 123456   Chronic systolic CHF (congestive heart failure) ([place]) 06/15/2015   Coronary artery disease 02/17/2018   Cardiac catheterization 2017 showing 10% LAD and 10% RCA Echocardiogram and stress test done in December 2019 showed evidence of old small inferior lateral myocardial infarction   Diabetes ([place]) 06/20/2019   Diabetes mellitus    Diarrhea, unspecified 10/22/2018   Dyspnea    Encounter for general adult medical examination with abnormal findings 06/12/2019   Erectile dysfunction    ESRD (end stage renal disease) on dialysis [facility])    Essential  hypertension 07/03/2016   Fluid overload, unspecified 01/26/2019   Hyperlipidemia    Hyperlipidemia associated with type 2 diabetes mellitus ([place]) 09/18/2011   Started statin therapy 09/2011.  Anticipate improvement as glucose control improves.    Hypertension    Iron deficiency anemia, unspecified 11/03/2018   Low testosterone 10/04/2015   12/01/2015 visit with Festus Aloe, MD at [facility].    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   Secondary hyperparathyroidism ([place]) 10/20/2018    Past Surgical History:  Procedure Laterality Date   AV FISTULA PLACEMENT Left 05/26/2018   Procedure: ARTERIOVENOUS (AV) FISTULA CREATION LEFT ARM;  Surgeon: Marty Heck, MD;  Location: [place];  Service: Vascular;  Laterality: Left;   CARDIAC CATHETERIZATION N/A 06/15/2015   Procedure: Left Heart Cath and Coronary Angiography;  Surgeon: Peter M Martinique, MD;  Location: [place] CV LAB;  Service: Cardiovascular;  Laterality: N/A;   CHOLECYSTECTOMY N/A 06/28/2017   Procedure: LAPAROSCOPIC CHOLECYSTECTOMY;  Surgeon: Ralene Ok, MD;  [facility];  Service: General;  Laterality: N/A;   HERNIA REPAIR  7th grade   umbilical   LEFT HEART CATH AND CORONARY ANGIOGRAPHY N/A 04/28/2019   Procedure: LEFT HEART CATH AND CORONARY ANGIOGRAPHY;  Surgeon: Leonie Man, MD;  Location: [place] [facility];  Service: Cardiovascular;  Laterality: N/A;   Left Kidney Transplant  07/12/2020    Social History   Socioeconomic History   Marital status: Divorced    Spouse name: n/a   Number of children: 1   Years of education: 12+   Highest education level: Not on file  Occupational History   Occupation: Building services engineer: [employer]   Tobacco Use   Smoking status: Never   Smokeless tobacco: Never  Vaping Use   Vaping Use: Never used  Substance and Sexual Activity   Alcohol use: No    Alcohol/week: 0.0 standard drinks   Drug use: No   Sexual activity: Not Currently    Comment: Erectile dysfunciotn  Other Topics Concern   Not on file  Social History Narrative   Divorced. Education: [employer].    Unable to exercise due to significant SOB and DOE.    Lives alone.   Son lives in [place].   Social Determinants of Health   Financial Resource Strain: Not on file  Food Insecurity: Not on file  Transportation Needs: Not on file  Physical Activity: Not on file  Stress: Not on file  Social Connections: Not on file  Intimate Partner Violence: Not on file    Current Outpatient Medications on File Prior to Visit  Medication Sig Dispense Refill   albuterol (PROVENTIL) (2.5 MG/3ML) 0.083% nebulizer solution Take 3 mLs (2.5 mg total) by nebulization every 6 (six) hours as needed for wheezing or shortness of breath. 75 mL 5   aspirin EC 81 MG tablet Take 81 mg by mouth daily.     BD PEN NEEDLE NANO 2ND GEN 32G X 4 MM MISC USE DAILY WITH INSULIN (Patient taking differently: 1 application by Other route as needed (Use with insulin).) 100 each 0   Continuous Blood Gluc Sensor (FREESTYLE LIBRE 14 DAY SENSOR) MISC 1 Device by Other route every 14 (fourteen) days. 2 each 11   glucose blood (ONETOUCH VERIO) test strip 1 each by Other route 2 (two) times daily as needed for other. And lancets 2/day 100 each 12   hydrALAZINE (APRESOLINE) 25 MG tablet TAKE 1 TABLET(25 MG) BY MOUTH THREE TIMES DAILY 270 tablet 0   isosorbide mononitrate (IMDUR) 60 MG 24 hr tablet TAKE 1 TABLET(60 MG) BY MOUTH DAILY 90 tablet 2   magnesium oxide (MAG-OX) 400 MG tablet Take 2 tablets by mouth daily.     mycophenolate (MYFORTIC) 180 MG EC tablet Take by mouth.     NIFEdipine (PROCARDIA-XL/NIFEDICAL-XL) 30 MG 24 hr tablet Take 1 tablet by mouth daily.      pantoprazole (PROTONIX) 40 MG tablet Take 1 tablet by mouth daily.     polyethylene glycol powder (GLYCOLAX/MIRALAX) 17 GM/SCOOP powder Take 17 g by mouth as needed for constipation.     predniSONE (DELTASONE) 5 MG tablet Take 1 tablet by mouth daily.     sevelamer carbonate (RENVELA) 800 MG tablet Take 2,400 mg by mouth 3 (three) times daily.     sulfamethoxazole-trimethoprim (BACTRIM) 400-80 MG tablet Take 1 tablet by mouth 3 (three) times a week. Monday, Wednesday's and Friday     tacrolimus (PROGRAF) 1 MG capsule Take by mouth.     valGANciclovir (VALCYTE) 450 MG tablet Take by mouth.     VITAMIN D PO Take 1 tablet by mouth daily. Unknown strength     carvedilol (COREG) 25 MG tablet Take by mouth.     No current facility-administered medications on file prior to  visit.    Allergies  Allergen Reactions   Lipitor [Atorvastatin] Hives and Itching    Family History  Problem Relation Age of Onset   Arthritis Mother    COPD Mother    Diabetes Mother        was thin, took insulin   Hypertension Mother    Hypertension Father    Arthritis Sister        rheumatoid   COPD Sister    Diabetes Brother    Colon cancer Neg Hx     BP 140/78 (BP Location: Right Arm, Patient Position: Sitting, Cuff Size: Normal)   Pulse 79   Ht '5\' 9"'$  (1.753 m)   Wt 187 lb 9.6 oz (85.1 kg)   SpO2 98%   BMI 27.70 kg/m   Review of Systems     Objective:   Physical Exam Pulses: dorsalis pedis intact bilat.   MSK: no deformity of the feet CV: no leg edema Skin:  no ulcer on the feet.  normal color and temp on the feet.   Neuro: sensation is intact to touch on the feet.     Lab Results  Component Value Date   HGBA1C 9.2 (A) 02/24/2021      Assessment & Plan:  Insulin-requiring type 2 DM: uncontrolled.  We discussed.  He wants to d/c multiple daily injections.   Abd cramps, due to Ozempic.  He will stay off this.  Patient Instructions  Please change both of your insulins to NPH, 70 units each  morning. On this type of insulin schedule, you should eat meals on a regular schedule.  If a meal is missed or significantly delayed, your blood sugar could go low.  Please stop taking the metformin.  I have sent a prescription to your pharmacy, for the continuous glucose monitor reader.   Please come back for a follow-up appointment in 2 months.

## 2021-02-24 NOTE — Patient Instructions (Addendum)
Please change both of your insulins to NPH, 70 units each morning. On this type of insulin schedule, you should eat meals on a regular schedule.  If a meal is missed or significantly delayed, your blood sugar could go low.  Please stop taking the metformin.  I have sent a prescription to your pharmacy, for the continuous glucose monitor reader.   Please come back for a follow-up appointment in 2 months.

## 2021-03-01 ENCOUNTER — Other Ambulatory Visit: Payer: Self-pay

## 2021-03-01 ENCOUNTER — Ambulatory Visit (INDEPENDENT_AMBULATORY_CARE_PROVIDER_SITE_OTHER): Payer: Medicare Other | Admitting: Cardiology

## 2021-03-01 ENCOUNTER — Ambulatory Visit (INDEPENDENT_AMBULATORY_CARE_PROVIDER_SITE_OTHER): Payer: Medicare Other | Admitting: Podiatry

## 2021-03-01 ENCOUNTER — Encounter: Payer: Self-pay | Admitting: Podiatry

## 2021-03-01 ENCOUNTER — Encounter: Payer: Self-pay | Admitting: Cardiology

## 2021-03-01 VITALS — BP 124/80 | HR 81 | Ht 69.0 in | Wt 177.0 lb

## 2021-03-01 DIAGNOSIS — E1121 Type 2 diabetes mellitus with diabetic nephropathy: Secondary | ICD-10-CM | POA: Diagnosis not present

## 2021-03-01 DIAGNOSIS — B351 Tinea unguium: Secondary | ICD-10-CM | POA: Diagnosis not present

## 2021-03-01 DIAGNOSIS — L84 Corns and callosities: Secondary | ICD-10-CM | POA: Diagnosis not present

## 2021-03-01 DIAGNOSIS — E782 Mixed hyperlipidemia: Secondary | ICD-10-CM | POA: Diagnosis not present

## 2021-03-01 DIAGNOSIS — I5022 Chronic systolic (congestive) heart failure: Secondary | ICD-10-CM

## 2021-03-01 DIAGNOSIS — M79676 Pain in unspecified toe(s): Secondary | ICD-10-CM

## 2021-03-01 DIAGNOSIS — Z794 Long term (current) use of insulin: Secondary | ICD-10-CM

## 2021-03-01 DIAGNOSIS — I2510 Atherosclerotic heart disease of native coronary artery without angina pectoris: Secondary | ICD-10-CM

## 2021-03-01 NOTE — Progress Notes (Signed)
Cardiology Office Note:    Date:  03/01/2021   ID:  Brandon Carroll, DOB 1961/05/13, MRN [id number]  PCP:  Hoyt Koch, MD  Cardiologist:  Jenne Campus, MD    Referring MD: Hoyt Koch, *   Chief Complaint  Patient presents with   Follow-up  Recently had viral infection  History of Present Illness:    Brandon Carroll is a 60 y.o. male with past medical history significant for chronic kidney failure.  The beginning of this year he did have kidney transplant, also coronary artery disease he did have cardiac catheterization done recently showing moderate disease of ostial circumflex as well as proximal RCA.  No critical reason that required intervention.  His left ventricle ejection fraction at that time was preserved. He comes today 2 months of follow-up overall he is doing better but overall he feels poorly recently he had viral infection required some modification of his medication.  Gradually build up to stamina.  Denies have any chest pain tightness squeezing pressure burning chest.  Past Medical History:  Diagnosis Date   A-V fistula ([place]) 03/12/2019   Abnormal CT of the chest 06/08/2015   Abnormal echocardiogram 123456   Acute systolic CHF (congestive heart failure), NYHA class 2 ([place]) 05/04/2015   Acute-on-chronic kidney injury ([place]) 05/04/2015   Anemia 06/06/2015   Anemia in chronic kidney disease 11/18/2018   Body mass index (BMI) 27.0-27.9, adult 123456   Chronic systolic CHF (congestive heart failure) ([place]) 06/15/2015   Coronary artery disease 02/17/2018   Cardiac catheterization 2017 showing 10% LAD and 10% RCA Echocardiogram and stress test done in December 2019 showed evidence of old small inferior lateral myocardial infarction   Diabetes ([place]) 06/20/2019   Diabetes mellitus    Diarrhea, unspecified 10/22/2018   Dyspnea    Encounter for general adult medical examination with abnormal findings 06/12/2019   Erectile dysfunction    ESRD (end  stage renal disease) on dialysis [facility])    Essential hypertension 07/03/2016   Fluid overload, unspecified 01/26/2019   Hyperlipidemia    Hyperlipidemia associated with type 2 diabetes mellitus ([place]) 09/18/2011   Started statin therapy 09/2011.  Anticipate improvement as glucose control improves.    Hypertension    Iron deficiency anemia, unspecified 11/03/2018   Low testosterone 10/04/2015   12/01/2015 visit with Festus Aloe, MD at [facility].    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/08/2015   Secondary hyperparathyroidism ([place]) 10/20/2018    Past Surgical History:  Procedure Laterality Date   AV FISTULA PLACEMENT Left 05/26/2018   Procedure: ARTERIOVENOUS (AV) FISTULA CREATION LEFT ARM;  Surgeon: Marty Heck, MD;  Location: [place];  Service: Vascular;  Laterality: Left;   CARDIAC CATHETERIZATION N/A 06/15/2015   Procedure: Left Heart Cath and Coronary Angiography;  Surgeon: Peter M Martinique, MD;  Location: [place] CV LAB;  Service: Cardiovascular;  Laterality: N/A;   CHOLECYSTECTOMY N/A 06/28/2017   Procedure: LAPAROSCOPIC CHOLECYSTECTOMY;  Surgeon: Ralene Ok, MD;  [facility];  Service: General;  Laterality: N/A;   HERNIA REPAIR  7th grade   umbilical   LEFT HEART CATH AND CORONARY ANGIOGRAPHY N/A 04/28/2019   Procedure: LEFT HEART CATH AND CORONARY ANGIOGRAPHY;  Surgeon: Leonie Man, MD;  Location: [place] CV LAB;  Service: Cardiovascular;  Laterality: N/A;  Left Kidney Transplant  07/12/2020    Current Medications: Current Meds  Medication Sig   albuterol (PROVENTIL) (2.5 MG/3ML) 0.083% nebulizer solution Take 3 mLs (2.5 mg total) by nebulization every 6 (six) hours as needed for wheezing or shortness of  breath.   aspirin EC 81 MG tablet Take 81 mg by mouth daily.   BD PEN NEEDLE NANO 2ND GEN 32G X 4 MM MISC USE DAILY WITH INSULIN (Patient taking differently: 1 application by Other route as needed (Use with insulin).)   carvedilol (COREG) 25 MG tablet Take 25 mg by mouth 2 (two) times daily with a meal.   Continuous Blood Gluc Sensor (FREESTYLE LIBRE 14 DAY SENSOR) MISC 1 Device by Other route every 14 (fourteen) days. (Patient taking differently: 1 Device by Other route every 14 (fourteen) days. Glucose monitoring)   glucose blood (ONETOUCH VERIO) test strip 1 each by Other route 2 (two) times daily as needed for other. And lancets 2/day (Patient taking differently: 1 each by Other route 2 (two) times daily as needed for other (GLucose check). And lancets 2/day)   hydrALAZINE (APRESOLINE) 25 MG tablet TAKE 1 TABLET(25 MG) BY MOUTH THREE TIMES DAILY (Patient taking differently: Take 25 mg by mouth 3 (three) times daily.)   Insulin NPH, Human,, Isophane, (NOVOLIN N FLEXPEN) 100 UNIT/ML Kiwkpen Inject 70 Units into the skin every morning.   isosorbide mononitrate (IMDUR) 60 MG 24 hr tablet TAKE 1 TABLET(60 MG) BY MOUTH DAILY (Patient taking differently: Take 60 mg by mouth daily.)   magnesium oxide (MAG-OX) 400 MG tablet Take 2 tablets by mouth daily.   mycophenolate (MYFORTIC) 180 MG EC tablet Take 180 mg by mouth 2 (two) times daily.   NIFEdipine (PROCARDIA-XL/NIFEDICAL-XL) 30 MG 24 hr tablet Take 2 tablets by mouth daily.   pantoprazole (PROTONIX) 40 MG tablet Take 1 tablet by mouth daily.   pantoprazole (PROTONIX) 40 MG tablet Take 40 mg by mouth daily.   polyethylene glycol powder (GLYCOLAX/MIRALAX) 17 GM/SCOOP powder Take 17 g by mouth as needed for constipation.   predniSONE (DELTASONE) 5 MG tablet Take 1 tablet by mouth daily.   sulfamethoxazole-trimethoprim (BACTRIM) 400-80 MG tablet Take 1 tablet by mouth 3 (three) times a week. Monday, Wednesday's and Friday   tacrolimus (PROGRAF) 1 MG  capsule Take 1 mg by mouth 2 (two) times daily.   valGANciclovir (VALCYTE) 450 MG tablet Take 900 mg by mouth 2 (two) times daily.   VITAMIN D PO Take 1 tablet by mouth daily. Unknown strength     Allergies:   Lipitor [atorvastatin]   Social History   Socioeconomic History   Marital status: Divorced    Spouse name: n/a   Number of children: 1   Years of education: 12+   Highest education level: Not on file  Occupational History   Occupation: Building services engineer: [employer]  Tobacco Use   Smoking status: Never   Smokeless tobacco: Never  Vaping Use   Vaping Use: Never used  Substance and Sexual Activity   Alcohol use: No    Alcohol/week: 0.0 standard drinks   Drug use: No   Sexual activity: Not Currently    Comment: Erectile dysfunciotn  Other Topics Concern   Not on file  Social History Narrative   Divorced. Education: [employer].    Unable to exercise due to significant SOB and DOE.    Lives alone.   Son lives in [place].   Social Determinants of Radio broadcast assistant  Strain: Not on file  Food Insecurity: Not on file  Transportation Needs: Not on file  Physical Activity: Not on file  Stress: Not on file  Social Connections: Not on file     Family History: The patient's family history includes Arthritis in his mother and sister; COPD in his mother and sister; Diabetes in his brother and mother; Hypertension in his father and mother. There is no history of Colon cancer. ROS:   Please see the history of present illness.    All 14 point review of systems negative except as described per history of present illness  EKGs/Labs/Other Studies Reviewed:      Recent Labs: No results found for requested labs within last 8760 hours.  Recent Lipid Panel    Component Value Date/Time   CHOL 197 09/24/2017 0823   CHOL 305 (H) 03/21/2017 1124   TRIG 245.0 (H) 09/24/2017 0823   HDL 45.60 09/24/2017 0823   HDL 60 03/21/2017 1124   CHOLHDL 4 09/24/2017 0823    VLDL 49.0 (H) 09/24/2017 0823   LDLCALC 212 (H) 03/21/2017 1124   LDLDIRECT 106.0 09/24/2017 0823    Physical Exam:    VS:  BP 124/80 (BP Location: Right Arm, Patient Position: Sitting)   Pulse 81   Ht '5\' 9"'$  (1.753 m)   Wt 177 lb (80.3 kg)   SpO2 98%   BMI 26.14 kg/m     Wt Readings from Last 3 Encounters:  03/01/21 177 lb (80.3 kg)  02/24/21 187 lb 9.6 oz (85.1 kg)  01/27/21 198 lb 9.6 oz (90.1 kg)     GEN:  Well nourished, well developed in no acute distress HEENT: Normal NECK: No JVD; No carotid bruits LYMPHATICS: No lymphadenopathy CARDIAC: RRR, no murmurs, no rubs, no gallops RESPIRATORY:  Clear to auscultation without rales, wheezing or rhonchi  ABDOMEN: Soft, non-tender, non-distended MUSCULOSKELETAL:  No edema; No deformity  SKIN: Warm and dry LOWER EXTREMITIES: no swelling NEUROLOGIC:  Alert and oriented x 3 PSYCHIATRIC:  Normal affect   ASSESSMENT:    1. Coronary artery disease involving native coronary artery of native heart without angina pectoris   2. Mixed hyperlipidemia   3. Chronic systolic CHF (congestive heart failure) (HCC)    PLAN:    In order of problems listed above:  Coronary disease moderate disease based on cardiac catheterization done recently.  He is on antiplatelet therapy which I will continue he is also on Imdur which I will continue.  To make this appointment is still not on statin and not sure exactly why.  I did review his last fasting lipid profile however her triglycerides were elevated therefore I will schedule him to have direct LDL however I want him to recover from his viral infection before I do that so it will be done in about a month.  I wish him to put him on cholesterol medication probably pravastatin will be the best choice. Systolic congestive heart failure last cardiac catheterization showed preserved left ventricle ejection fraction.  We will continue monitoring.  He is on appropriate medication. S/p renal transplant  that be follow-up by nephrology team   Medication Adjustments/Labs and Tests Ordered: Current medicines are reviewed at length with the patient today.  Concerns regarding medicines are outlined above.  No orders of the defined types were placed in this encounter.  Medication changes: No orders of the defined types were placed in this encounter.   Signed, Park Liter, MD, [facility] 03/01/2021 2:50 PM    [facility]

## 2021-03-01 NOTE — Addendum Note (Signed)
Addended by: Senaida Ores on: 03/01/2021 02:57 PM   Modules accepted: Orders

## 2021-03-01 NOTE — Patient Instructions (Signed)
Medication Instructions:  Your physician recommends that you continue on your current medications as directed. Please refer to the Current Medication list given to you today.  *If you need a refill on your cardiac medications before your next appointment, please call your pharmacy*   Lab Work: Your physician recommends that you return for lab work in 6 weeks: direct ldl  If you have labs (blood work) drawn today and your tests are completely normal, you will receive your results only by: [place] (if you have MyChart) OR A paper copy in the mail If you have any lab test that is abnormal or we need to change your treatment, we will call you to review the results.   Testing/Procedures: None   Follow-Up: At [facility], you and your health needs are our priority.  As part of our continuing mission to provide you with exceptional heart care, we have created designated Provider Care Teams.  These Care Teams include your primary Cardiologist (physician) and Advanced Practice Providers (APPs -  Physician Assistants and Nurse Practitioners) who all work together to provide you with the care you need, when you need it.  We recommend signing up for the patient portal called "MyChart".  Sign up information is provided on this After Visit Summary.  MyChart is used to connect with patients for Virtual Visits (Telemedicine).  Patients are able to view lab/test results, encounter notes, upcoming appointments, etc.  Non-urgent messages can be sent to your provider as well.   To learn more about what you can do with MyChart, go to [email or web address].    Your next appointment:   5 month(s)  The format for your next appointment:   In Person  Provider:   Jenne Campus, MD   Other Instructions

## 2021-03-05 NOTE — Progress Notes (Signed)
  Subjective:  Patient ID: Brandon Carroll, male    DOB: 17-Apr-1961,  MRN: [id number]  60 y.o. male presents with at risk foot care. Patient has history of NIDDM with ESRD. He is s/p kidney transplant (deceased donor, 2020/08/04). Patient states he continues to feel well. He voices no new pedal concerns on today's visit .    Patient's blood sugar was 243 mg/dl today.   PCP: Hoyt Koch, MD and last visit was: 01/27/2021.  Review of Systems: Negative except as noted in the HPI.   Allergies  Allergen Reactions   Lipitor [Atorvastatin] Hives and Itching    Objective:  There were no vitals filed for this visit. Constitutional Patient is a pleasant 60 y.o. African American male WD, WN in NAD. AAO x 3.  Vascular Capillary fill time to digits immediate b/l.  DP/PT pulse(s) are palpable b/l lower extremities. Pedal hair is present. Lower extremity skin temperature gradient within normal limits. No pain with calf compression b/l. No edema noted b/l lower extremities. No cyanosis or clubbing noted.   Neurologic Protective sensation intact 5/5 intact bilaterally with 10g monofilament b/l. Vibratory sensation intact b/l. No clonus b/l.   Dermatologic Pedal skin is warm and supple b/l.  No open wounds b/l lower extremities. No interdigital macerations b/l lower extremities. Toenails 1-5 b/l elongated, discolored, dystrophic, thickened, crumbly with subungual debris and tenderness to dorsal palpation. Hyperkeratotic lesion(s) sub 5th met base b/l feet and plantar aspect b/l heel pads.  No erythema, no edema, no drainage, no fluctuance.  Orthopedic: Normal muscle strength 5/5 to all lower extremity muscle groups bilaterally. Hallux valgus with bunion deformity noted b/l lower extremities. Adductovarus deformity b/l feet.   Hemoglobin A1C Latest Ref Rng & Units 02/24/2021 06/16/2020 03/10/2020  HGBA1C 4.0 - 5.6 % 9.2(A) 7.7(A) 7.6(A)  Some recent data might be hidden    Assessment:   1. Pain  due to onychomycosis of toenail   2. Callus   3. Type 2 diabetes mellitus with diabetic nephropathy, with long-term current use of insulin ([place])    Plan:  Patient was evaluated and treated and all questions answered. Consent given for treatment as described below: -Examined patient. -Continue diabetic foot care principles: inspect feet daily, monitor glucose as recommended by PCP and/or Endocrinologist, and follow prescribed diet per PCP, Endocrinologist and/or dietician. -Patient to continue soft, supportive shoe gear daily. I am concerned he has lateral midfoot calluses indicating stress at the midfoot. Start procedure for diabetic shoes. Patient qualifies based on diagnoses. -Mycotic toenails were debrided in length and girth with sterile nail nippers and dremel without iatrogenic bleeding. -Callus(es) sub 5th met base b/l lower extremities and plantar aspect b/l heel pads pared utilizing sterile scalpel blade without complication or incident. Total number debrided =4. -Patient to report any pedal injuries to medical professional immediately. -Patient/POA to call should there be question/concern in the interim.  Return in about 3 months (around 06/01/2021).  Marzetta Board, DPM

## 2021-03-07 DIAGNOSIS — Z792 Long term (current) use of antibiotics: Secondary | ICD-10-CM | POA: Diagnosis not present

## 2021-03-07 DIAGNOSIS — E1121 Type 2 diabetes mellitus with diabetic nephropathy: Secondary | ICD-10-CM | POA: Diagnosis not present

## 2021-03-07 DIAGNOSIS — D849 Immunodeficiency, unspecified: Secondary | ICD-10-CM | POA: Diagnosis not present

## 2021-03-07 DIAGNOSIS — B259 Cytomegaloviral disease, unspecified: Secondary | ICD-10-CM | POA: Diagnosis not present

## 2021-03-07 DIAGNOSIS — E119 Type 2 diabetes mellitus without complications: Secondary | ICD-10-CM | POA: Diagnosis not present

## 2021-03-07 DIAGNOSIS — Z7952 Long term (current) use of systemic steroids: Secondary | ICD-10-CM | POA: Diagnosis not present

## 2021-03-07 DIAGNOSIS — Z79899 Other long term (current) drug therapy: Secondary | ICD-10-CM | POA: Diagnosis not present

## 2021-03-07 DIAGNOSIS — I10 Essential (primary) hypertension: Secondary | ICD-10-CM | POA: Diagnosis not present

## 2021-03-07 DIAGNOSIS — Z794 Long term (current) use of insulin: Secondary | ICD-10-CM | POA: Diagnosis not present

## 2021-03-07 DIAGNOSIS — Z940 Kidney transplant status: Secondary | ICD-10-CM | POA: Diagnosis not present

## 2021-03-07 DIAGNOSIS — E785 Hyperlipidemia, unspecified: Secondary | ICD-10-CM | POA: Diagnosis not present

## 2021-03-07 DIAGNOSIS — Z4822 Encounter for aftercare following kidney transplant: Secondary | ICD-10-CM | POA: Diagnosis not present

## 2021-03-20 ENCOUNTER — Telehealth: Payer: Self-pay | Admitting: *Deleted

## 2021-03-20 NOTE — Chronic Care Management (AMB) (Signed)
  Chronic Care Management   Note  03/20/2021 Name: Brandon Carroll MRN: [id number] DOB: 1960/06/02  [name] is a 60 y.o. year old male who is a primary care patient of Hoyt Koch, MD. I reached out to [employer] by phone today in response to a referral sent by [name]'s PCP.  [name] was given information about Chronic Care Management services today including:  CCM service includes personalized support from designated clinical staff supervised by his physician, including individualized plan of care and coordination with other care providers 24/7 contact phone numbers for assistance for urgent and routine care needs. Service will only be billed when office clinical staff spend 20 minutes or more in a month to coordinate care. Only one practitioner may furnish and bill the service in a calendar month. The patient may stop CCM services at any time (effective at the end of the month) by phone call to the office staff. The patient is responsible for co-pay (up to 20% after annual deductible is met) if co-pay is required by the individual health plan.   Patient agreed to services and verbal consent obtained.   Follow up plan: Telephone appointment with care management team member scheduled for:03/22/21  [facility]  Direct Dial: [phone]

## 2021-03-21 DIAGNOSIS — E785 Hyperlipidemia, unspecified: Secondary | ICD-10-CM | POA: Diagnosis not present

## 2021-03-21 DIAGNOSIS — Z792 Long term (current) use of antibiotics: Secondary | ICD-10-CM | POA: Diagnosis not present

## 2021-03-21 DIAGNOSIS — Z794 Long term (current) use of insulin: Secondary | ICD-10-CM | POA: Diagnosis not present

## 2021-03-21 DIAGNOSIS — Z79631 Long term (current) use of antimetabolite agent: Secondary | ICD-10-CM | POA: Diagnosis not present

## 2021-03-21 DIAGNOSIS — Z940 Kidney transplant status: Secondary | ICD-10-CM | POA: Diagnosis not present

## 2021-03-21 DIAGNOSIS — Z7952 Long term (current) use of systemic steroids: Secondary | ICD-10-CM | POA: Diagnosis not present

## 2021-03-21 DIAGNOSIS — Z79899 Other long term (current) drug therapy: Secondary | ICD-10-CM | POA: Diagnosis not present

## 2021-03-21 DIAGNOSIS — D849 Immunodeficiency, unspecified: Secondary | ICD-10-CM | POA: Diagnosis not present

## 2021-03-21 DIAGNOSIS — Z4822 Encounter for aftercare following kidney transplant: Secondary | ICD-10-CM | POA: Diagnosis not present

## 2021-03-21 DIAGNOSIS — E1121 Type 2 diabetes mellitus with diabetic nephropathy: Secondary | ICD-10-CM | POA: Diagnosis not present

## 2021-03-21 DIAGNOSIS — B349 Viral infection, unspecified: Secondary | ICD-10-CM | POA: Diagnosis not present

## 2021-03-21 DIAGNOSIS — D649 Anemia, unspecified: Secondary | ICD-10-CM | POA: Diagnosis not present

## 2021-03-21 DIAGNOSIS — E119 Type 2 diabetes mellitus without complications: Secondary | ICD-10-CM | POA: Diagnosis not present

## 2021-03-21 DIAGNOSIS — I10 Essential (primary) hypertension: Secondary | ICD-10-CM | POA: Diagnosis not present

## 2021-03-22 ENCOUNTER — Telehealth: Payer: Self-pay | Admitting: *Deleted

## 2021-03-22 ENCOUNTER — Telehealth: Payer: Medicare Other

## 2021-03-22 ENCOUNTER — Encounter: Payer: Self-pay | Admitting: *Deleted

## 2021-03-22 NOTE — Telephone Encounter (Signed)
  Chronic Care Management   Follow Up Note   03/22/2021 Name: Brandon Carroll MRN: [id number] DOB: 09-03-1960  Referred by: Hoyt Koch, MD Reason for referral : No chief complaint on file.  An unsuccessful telephone outreach was attempted today. The patient was referred to the case management team for assistance with care management and care coordination.   Follow Up Plan:  A HIPPA compliant phone message was left for the patient providing contact information and requesting a return call Will place request with scheduling care guide to contact patient to re-schedule today's missed CCM RN initial telephone appointment if I do not hear back from patient by end of day  Oneta Rack, RN, BSN, [place] [phone]: direct office [phone]: mobile

## 2021-03-24 ENCOUNTER — Telehealth: Payer: Self-pay | Admitting: *Deleted

## 2021-03-24 NOTE — Chronic Care Management (AMB) (Signed)
  Care Management   Note  03/24/2021 Name: Brandon Carroll MRN: [id number] DOB: [birth year]  Brandon Carroll is a 60 y.o. year old male who is a primary care patient of Hoyt Koch, MD and is actively engaged with the care management team. I reached out to [employer] by phone today to assist with re-scheduling an initial visit with the RN Case Manager  Follow up plan: Unsuccessful telephone outreach attempt made. The care management team will reach out to the patient again over the next 7 days. If patient returns call to provider office, please advise to call [place] at [place]  Direct Dial: [phone]

## 2021-03-31 NOTE — Chronic Care Management (AMB) (Signed)
  Care Management   Note  03/31/2021 Name: Brandon Carroll MRN: [id number] DOB: [birth year]  Brandon Carroll is a 60 y.o. year old male who is a primary care patient of Hoyt Koch, MD and is actively engaged with the care management team. I reached out to [employer] by phone today to assist with re-scheduling an initial visit with the RN Case Manager  Follow up plan: A second unsuccessful telephone outreach attempt made. A HIPAA compliant phone message was left for the patient providing contact information and requesting a return call.  The care management team will reach out to the patient again over the next 7 days.  If patient returns call to provider office, please advise to call [place] at [phone].  [facility]  Direct Dial: [phone]

## 2021-04-04 DIAGNOSIS — D849 Immunodeficiency, unspecified: Secondary | ICD-10-CM | POA: Diagnosis not present

## 2021-04-04 DIAGNOSIS — Z940 Kidney transplant status: Secondary | ICD-10-CM | POA: Diagnosis not present

## 2021-04-05 NOTE — Chronic Care Management (AMB) (Signed)
  Care Management   Note  04/05/2021 Name: [name] MRN: [id number] DOB: Mar 07, 1961  Brandon Carroll is a 60 y.o. year old male who is a primary care patient of Hoyt Koch, MD and is actively engaged with the care management team. I reached out to [employer] by phone today to assist with re-scheduling an initial visit with the RN Case Manager  Follow up plan: A third unsuccessful telephone outreach attempt made. A HIPAA compliant phone message was left for the patient providing contact information and requesting a return call. Unable to make contact on outreach attempts x 3. PCP Hoyt Koch, MD notified via routed documentation in medical record. We have been unable to make contact with the patient for follow up. The care management team is available to follow up with the patient after provider conversation with the patient regarding recommendation for care management engagement and subsequent re-referral to the care management team.   [facility]  Direct Dial: [phone]

## 2021-04-24 ENCOUNTER — Ambulatory Visit (INDEPENDENT_AMBULATORY_CARE_PROVIDER_SITE_OTHER): Payer: Medicare Other | Admitting: Endocrinology

## 2021-04-24 ENCOUNTER — Other Ambulatory Visit: Payer: Self-pay

## 2021-04-24 VITALS — BP 176/76 | HR 82 | Ht 69.0 in | Wt 199.0 lb

## 2021-04-24 DIAGNOSIS — Z794 Long term (current) use of insulin: Secondary | ICD-10-CM

## 2021-04-24 DIAGNOSIS — E1121 Type 2 diabetes mellitus with diabetic nephropathy: Secondary | ICD-10-CM

## 2021-04-24 LAB — POCT GLYCOSYLATED HEMOGLOBIN (HGB A1C): Hemoglobin A1C: 8 % — AB (ref 4.0–5.6)

## 2021-04-24 MED ORDER — INSULIN ISOPHANE & REGULAR (HUMAN 70-30)100 UNIT/ML KWIKPEN: 70.0000 [IU] | PEN_INJECTOR | Freq: Every day | SUBCUTANEOUS | 3 refills | Status: DC

## 2021-04-24 NOTE — Patient Instructions (Addendum)
I have sent a prescription to your pharmacy, to change NPH to 70/30, 70 units each morning. On this type of insulin schedule, you should eat meals on a regular schedule.  If a meal is missed or significantly delayed, your blood sugar could go low.  Please come back for a follow-up appointment in 2 months.

## 2021-04-24 NOTE — Progress Notes (Signed)
Subjective:    Patient ID: Brandon Carroll, male    DOB: 21-Apr-1961, 60 y.o.   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   Dx'ed: 9476 Complications: ESRD (transplant 2022).   Therapy: insulin since 2014.   DKA: never.  Severe hypoglycemia: once (early 2020).  Pancreatitis: never.  SDOH: he is on QD insulin, due to h/o noncompliance and pattern of cbg's Other: fructosamine converts to slightly higher A1c than cbg's and A1c itself; he changed Lantus to NPH, then 75/25, due to pattern of cbg's; He stopped Ozempic (abd cramps). Interval history: I reviewed continuous glucose monitor data.  Pt says glucose varies from 68-250.  It is in general higher 8AM-5PM, but data are limited  Pt says he never misses the insulin.  Past Medical History:  Diagnosis Date   A-V fistula ([place]) 03/12/2019   Abnormal CT of the chest 06/08/2015   Abnormal echocardiogram 54/65/0354   Acute systolic CHF (congestive heart failure), NYHA class 2 ([place]) 05/04/2015   Acute-on-chronic kidney injury ([place]) 05/04/2015   Anemia 06/06/2015   Anemia in chronic kidney disease 11/18/2018   Body mass index (BMI) 27.0-27.9, adult 10/17/6810   Chronic systolic CHF (congestive heart failure) ([place]) 06/15/2015   Coronary artery disease 02/17/2018   Cardiac catheterization 2017 showing 10% LAD and 10% RCA Echocardiogram and stress test done in December 2019 showed evidence of 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  Anticipate improvement as glucose control improves.    Hypertension    Iron deficiency anemia, unspecified 11/03/2018   Low testosterone 10/04/2015   12/01/2015 visit with Festus Aloe, MD at [facility].    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   Secondary hyperparathyroidism ([place]) 10/20/2018    Past Surgical History:  Procedure Laterality Date   AV FISTULA PLACEMENT Left 05/26/2018   Procedure: ARTERIOVENOUS (AV) FISTULA CREATION LEFT ARM;  Surgeon: Marty Heck, MD;  Location: [facility];  Service: Vascular;  Laterality: Left;   CARDIAC CATHETERIZATION N/A 06/15/2015   Procedure: Left Heart Cath and Coronary Angiography;  Surgeon: Peter M Martinique, MD;  Location: [place] [facility];  Service: Cardiovascular;  Laterality: N/A;   CHOLECYSTECTOMY N/A 06/28/2017   Procedure: LAPAROSCOPIC CHOLECYSTECTOMY;  Surgeon: Ralene Ok, MD;  Location: [facility];  Service: General;  Laterality: N/A;   HERNIA REPAIR  7th grade   umbilical   LEFT HEART CATH AND CORONARY ANGIOGRAPHY N/A 04/28/2019   Procedure: LEFT HEART CATH AND CORONARY ANGIOGRAPHY;  Surgeon: Leonie Man, MD;  Location: [place] [facility];  Service: Cardiovascular;  Laterality: N/A;   Left Kidney Transplant  07/12/2020    Social History   Socioeconomic History   Marital status: Divorced    Spouse name: n/a   Number  of children: 1   Years of education: 12+   Highest education level: Not on file  Occupational History   Occupation: Building services engineer: [employer]  Tobacco Use   Smoking status: Never   Smokeless tobacco: Never  Vaping Use   Vaping Use: Never used  Substance and Sexual Activity   Alcohol use: No     Alcohol/week: 0.0 standard drinks   Drug use: No   Sexual activity: Not Currently    Comment: Erectile dysfunciotn  Other Topics Concern   Not on file  Social History Narrative   Divorced. Education: [employer].    Unable to exercise due to significant SOB and DOE.    Lives alone.   Son lives in [place].   Social Determinants of Health   Financial Resource Strain: Not on file  Food Insecurity: Not on file  Transportation Needs: Not on file  Physical Activity: Not on file  Stress: Not on file  Social Connections: Not on file  Intimate Partner Violence: Not on file    Current Outpatient Medications on File Prior to Visit  Medication Sig Dispense Refill   albuterol (PROVENTIL) (2.5 MG/3ML) 0.083% nebulizer solution Take 3 mLs (2.5 mg total) by nebulization every 6 (six) hours as needed for wheezing or shortness of breath. 75 mL 5   aspirin EC 81 MG tablet Take 81 mg by mouth daily.     BD PEN NEEDLE NANO 2ND GEN 32G X 4 MM MISC USE DAILY WITH INSULIN (Patient taking differently: 1 application by Other route as needed (Use with insulin).) 100 each 0   carvedilol (COREG) 25 MG tablet Take 25 mg by mouth 2 (two) times daily with a meal.     Continuous Blood Gluc Sensor (FREESTYLE LIBRE 14 DAY SENSOR) MISC 1 Device by Other route every 14 (fourteen) days. (Patient taking differently: 1 Device by Other route every 14 (fourteen) days. Glucose monitoring) 2 each 11   glucose blood (ONETOUCH VERIO) test strip 1 each by Other route 2 (two) times daily as needed for other. And lancets 2/day (Patient taking differently: 1 each by Other route 2 (two) times daily as needed for other (GLucose check). And lancets 2/day) 100 each 12   hydrALAZINE (APRESOLINE) 25 MG tablet TAKE 1 TABLET(25 MG) BY MOUTH THREE TIMES DAILY (Patient taking differently: Take 25 mg by mouth 3 (three) times daily.) 270 tablet 0   isosorbide mononitrate (IMDUR) 60 MG 24 hr tablet TAKE 1 TABLET(60 MG) BY MOUTH DAILY (Patient taking  differently: Take 60 mg by mouth daily.) 90 tablet 2   magnesium oxide (MAG-OX) 400 MG tablet Take 2 tablets by mouth daily.     mycophenolate (MYFORTIC) 180 MG EC tablet Take 180 mg by mouth 2 (two) times daily.     NIFEdipine (PROCARDIA-XL/NIFEDICAL-XL) 30 MG 24 hr tablet Take 2 tablets by mouth daily.     pantoprazole (PROTONIX) 40 MG tablet Take 1 tablet by mouth daily.     polyethylene glycol powder (GLYCOLAX/MIRALAX) 17 GM/SCOOP powder Take 17 g by mouth as needed for constipation.     predniSONE (DELTASONE) 5 MG tablet Take 1 tablet by mouth daily.     sulfamethoxazole-trimethoprim (BACTRIM) 400-80 MG tablet Take 1 tablet by mouth 3 (three) times a week. Monday, Wednesday's and Friday     tacrolimus (PROGRAF) 1 MG capsule Take 1 mg by mouth 2 (two) times daily.     valGANciclovir (VALCYTE) 450 MG tablet Take 900 mg by mouth 2 (two)  times daily.     pantoprazole (PROTONIX) 40 MG tablet Take 40 mg by mouth daily.     No current facility-administered medications on file prior to visit.    Allergies  Allergen Reactions   Lipitor [Atorvastatin] Hives and Itching    Family History  Problem Relation Age of Onset   Arthritis Mother    COPD Mother    Diabetes Mother        was thin, took insulin   Hypertension Mother    Hypertension Father    Arthritis Sister        rheumatoid   COPD Sister    Diabetes Brother    Colon cancer Neg Hx     BP (!) 176/76   Pulse 82   Ht 5\' 9"  (1.753 m)   Wt 199 lb (90.3 kg)   SpO2 96%   BMI 29.39 kg/m    Review of Systems     Objective:   Physical Exam    Lab Results  Component Value Date   HGBA1C 8.0 (A) 04/24/2021      Assessment & Plan:  Insulin-requiring type 2 DM: uncontrolled Hypoglycemia, due to insulin: this limits aggressiveness of glycemic control.  The pattern of his cbg's indicates he needs some adjustment in his therapy.    Patient Instructions  I have sent a prescription to your pharmacy, to change NPH to 70/30,  70 units each morning. On this type of insulin schedule, you should eat meals on a regular schedule.  If a meal is missed or significantly delayed, your blood sugar could go low.  Please come back for a follow-up appointment in 2 months.

## 2021-04-27 ENCOUNTER — Telehealth: Payer: Self-pay

## 2021-04-27 LAB — LDL CHOLESTEROL, DIRECT: LDL Direct: 97 mg/dL (ref 0–99)

## 2021-04-27 NOTE — Telephone Encounter (Signed)
-----   Message from Park Liter, MD sent at 04/27/2021 12:01 PM EST ----- Cholesterol mildly elevated diet and exercise for now

## 2021-04-27 NOTE — Telephone Encounter (Signed)
Patient notified of results and recommendations.

## 2021-05-16 DIAGNOSIS — D649 Anemia, unspecified: Secondary | ICD-10-CM | POA: Diagnosis not present

## 2021-05-16 DIAGNOSIS — Z792 Long term (current) use of antibiotics: Secondary | ICD-10-CM | POA: Diagnosis not present

## 2021-05-16 DIAGNOSIS — B259 Cytomegaloviral disease, unspecified: Secondary | ICD-10-CM | POA: Diagnosis not present

## 2021-05-16 DIAGNOSIS — Z79899 Other long term (current) drug therapy: Secondary | ICD-10-CM | POA: Diagnosis not present

## 2021-05-16 DIAGNOSIS — I10 Essential (primary) hypertension: Secondary | ICD-10-CM | POA: Diagnosis not present

## 2021-05-16 DIAGNOSIS — E785 Hyperlipidemia, unspecified: Secondary | ICD-10-CM | POA: Diagnosis not present

## 2021-05-16 DIAGNOSIS — Z7952 Long term (current) use of systemic steroids: Secondary | ICD-10-CM | POA: Diagnosis not present

## 2021-05-16 DIAGNOSIS — E119 Type 2 diabetes mellitus without complications: Secondary | ICD-10-CM | POA: Diagnosis not present

## 2021-05-16 DIAGNOSIS — E1121 Type 2 diabetes mellitus with diabetic nephropathy: Secondary | ICD-10-CM | POA: Diagnosis not present

## 2021-05-16 DIAGNOSIS — Z4822 Encounter for aftercare following kidney transplant: Secondary | ICD-10-CM | POA: Diagnosis not present

## 2021-05-16 DIAGNOSIS — Z794 Long term (current) use of insulin: Secondary | ICD-10-CM | POA: Diagnosis not present

## 2021-05-16 DIAGNOSIS — D849 Immunodeficiency, unspecified: Secondary | ICD-10-CM | POA: Diagnosis not present

## 2021-05-16 DIAGNOSIS — Z940 Kidney transplant status: Secondary | ICD-10-CM | POA: Diagnosis not present

## 2021-05-24 DIAGNOSIS — Z940 Kidney transplant status: Secondary | ICD-10-CM | POA: Diagnosis not present

## 2021-05-24 DIAGNOSIS — Z4822 Encounter for aftercare following kidney transplant: Secondary | ICD-10-CM | POA: Diagnosis not present

## 2021-05-24 DIAGNOSIS — I5022 Chronic systolic (congestive) heart failure: Secondary | ICD-10-CM | POA: Diagnosis not present

## 2021-05-24 DIAGNOSIS — N132 Hydronephrosis with renal and ureteral calculous obstruction: Secondary | ICD-10-CM | POA: Diagnosis not present

## 2021-05-24 DIAGNOSIS — R93421 Abnormal radiologic findings on diagnostic imaging of right kidney: Secondary | ICD-10-CM | POA: Diagnosis not present

## 2021-05-24 DIAGNOSIS — R93422 Abnormal radiologic findings on diagnostic imaging of left kidney: Secondary | ICD-10-CM | POA: Diagnosis not present

## 2021-05-24 DIAGNOSIS — R7989 Other specified abnormal findings of blood chemistry: Secondary | ICD-10-CM | POA: Diagnosis not present

## 2021-05-30 DIAGNOSIS — Z7952 Long term (current) use of systemic steroids: Secondary | ICD-10-CM | POA: Diagnosis not present

## 2021-05-30 DIAGNOSIS — I10 Essential (primary) hypertension: Secondary | ICD-10-CM | POA: Diagnosis not present

## 2021-05-30 DIAGNOSIS — E119 Type 2 diabetes mellitus without complications: Secondary | ICD-10-CM | POA: Diagnosis not present

## 2021-05-30 DIAGNOSIS — D649 Anemia, unspecified: Secondary | ICD-10-CM | POA: Diagnosis not present

## 2021-05-30 DIAGNOSIS — Z4822 Encounter for aftercare following kidney transplant: Secondary | ICD-10-CM | POA: Diagnosis not present

## 2021-05-30 DIAGNOSIS — Z940 Kidney transplant status: Secondary | ICD-10-CM | POA: Diagnosis not present

## 2021-05-30 DIAGNOSIS — Z794 Long term (current) use of insulin: Secondary | ICD-10-CM | POA: Diagnosis not present

## 2021-05-30 DIAGNOSIS — R7989 Other specified abnormal findings of blood chemistry: Secondary | ICD-10-CM | POA: Diagnosis not present

## 2021-05-30 DIAGNOSIS — E785 Hyperlipidemia, unspecified: Secondary | ICD-10-CM | POA: Diagnosis not present

## 2021-05-30 DIAGNOSIS — E1121 Type 2 diabetes mellitus with diabetic nephropathy: Secondary | ICD-10-CM | POA: Diagnosis not present

## 2021-05-30 DIAGNOSIS — Z7982 Long term (current) use of aspirin: Secondary | ICD-10-CM | POA: Diagnosis not present

## 2021-05-30 DIAGNOSIS — D849 Immunodeficiency, unspecified: Secondary | ICD-10-CM | POA: Diagnosis not present

## 2021-05-30 DIAGNOSIS — Z79621 Long term (current) use of calcineurin inhibitor: Secondary | ICD-10-CM | POA: Diagnosis not present

## 2021-05-30 DIAGNOSIS — Z79899 Other long term (current) drug therapy: Secondary | ICD-10-CM | POA: Diagnosis not present

## 2021-06-05 DIAGNOSIS — Z940 Kidney transplant status: Secondary | ICD-10-CM | POA: Diagnosis not present

## 2021-06-06 DIAGNOSIS — N12 Tubulo-interstitial nephritis, not specified as acute or chronic: Secondary | ICD-10-CM | POA: Diagnosis not present

## 2021-06-06 DIAGNOSIS — Z940 Kidney transplant status: Secondary | ICD-10-CM | POA: Diagnosis not present

## 2021-06-06 DIAGNOSIS — N261 Atrophy of kidney (terminal): Secondary | ICD-10-CM | POA: Diagnosis not present

## 2021-06-06 DIAGNOSIS — N269 Renal sclerosis, unspecified: Secondary | ICD-10-CM | POA: Diagnosis not present

## 2021-06-06 DIAGNOSIS — I701 Atherosclerosis of renal artery: Secondary | ICD-10-CM | POA: Diagnosis not present

## 2021-06-06 DIAGNOSIS — T8611 Kidney transplant rejection: Secondary | ICD-10-CM | POA: Diagnosis not present

## 2021-06-06 DIAGNOSIS — T8619 Other complication of kidney transplant: Secondary | ICD-10-CM | POA: Diagnosis not present

## 2021-06-06 DIAGNOSIS — R7989 Other specified abnormal findings of blood chemistry: Secondary | ICD-10-CM | POA: Diagnosis not present

## 2021-06-09 DIAGNOSIS — D849 Immunodeficiency, unspecified: Secondary | ICD-10-CM | POA: Diagnosis not present

## 2021-06-09 DIAGNOSIS — E119 Type 2 diabetes mellitus without complications: Secondary | ICD-10-CM | POA: Diagnosis not present

## 2021-06-09 DIAGNOSIS — Z4822 Encounter for aftercare following kidney transplant: Secondary | ICD-10-CM | POA: Diagnosis not present

## 2021-06-09 DIAGNOSIS — I10 Essential (primary) hypertension: Secondary | ICD-10-CM | POA: Diagnosis not present

## 2021-06-09 DIAGNOSIS — Z792 Long term (current) use of antibiotics: Secondary | ICD-10-CM | POA: Diagnosis not present

## 2021-06-09 DIAGNOSIS — Z7952 Long term (current) use of systemic steroids: Secondary | ICD-10-CM | POA: Diagnosis not present

## 2021-06-09 DIAGNOSIS — E1121 Type 2 diabetes mellitus with diabetic nephropathy: Secondary | ICD-10-CM | POA: Diagnosis not present

## 2021-06-09 DIAGNOSIS — I5022 Chronic systolic (congestive) heart failure: Secondary | ICD-10-CM | POA: Diagnosis not present

## 2021-06-09 DIAGNOSIS — Z940 Kidney transplant status: Secondary | ICD-10-CM | POA: Diagnosis not present

## 2021-06-09 DIAGNOSIS — D649 Anemia, unspecified: Secondary | ICD-10-CM | POA: Diagnosis not present

## 2021-06-09 DIAGNOSIS — R7989 Other specified abnormal findings of blood chemistry: Secondary | ICD-10-CM | POA: Diagnosis not present

## 2021-06-09 DIAGNOSIS — B259 Cytomegaloviral disease, unspecified: Secondary | ICD-10-CM | POA: Diagnosis not present

## 2021-06-09 DIAGNOSIS — Z79899 Other long term (current) drug therapy: Secondary | ICD-10-CM | POA: Diagnosis not present

## 2021-06-09 DIAGNOSIS — E785 Hyperlipidemia, unspecified: Secondary | ICD-10-CM | POA: Diagnosis not present

## 2021-06-09 DIAGNOSIS — T8611 Kidney transplant rejection: Secondary | ICD-10-CM | POA: Diagnosis not present

## 2021-06-09 DIAGNOSIS — Z794 Long term (current) use of insulin: Secondary | ICD-10-CM | POA: Diagnosis not present

## 2021-06-12 ENCOUNTER — Ambulatory Visit (INDEPENDENT_AMBULATORY_CARE_PROVIDER_SITE_OTHER): Payer: Medicare Other | Admitting: Podiatry

## 2021-06-12 ENCOUNTER — Encounter: Payer: Self-pay | Admitting: Podiatry

## 2021-06-12 ENCOUNTER — Other Ambulatory Visit: Payer: Self-pay

## 2021-06-12 DIAGNOSIS — B351 Tinea unguium: Secondary | ICD-10-CM

## 2021-06-12 DIAGNOSIS — Z940 Kidney transplant status: Secondary | ICD-10-CM

## 2021-06-12 DIAGNOSIS — E1121 Type 2 diabetes mellitus with diabetic nephropathy: Secondary | ICD-10-CM

## 2021-06-12 DIAGNOSIS — Z4822 Encounter for aftercare following kidney transplant: Secondary | ICD-10-CM | POA: Diagnosis not present

## 2021-06-12 DIAGNOSIS — L84 Corns and callosities: Secondary | ICD-10-CM | POA: Diagnosis not present

## 2021-06-12 DIAGNOSIS — D849 Immunodeficiency, unspecified: Secondary | ICD-10-CM | POA: Diagnosis not present

## 2021-06-12 DIAGNOSIS — M79676 Pain in unspecified toe(s): Secondary | ICD-10-CM

## 2021-06-12 DIAGNOSIS — Z794 Long term (current) use of insulin: Secondary | ICD-10-CM | POA: Diagnosis not present

## 2021-06-13 DIAGNOSIS — D649 Anemia, unspecified: Secondary | ICD-10-CM | POA: Diagnosis not present

## 2021-06-13 DIAGNOSIS — Z794 Long term (current) use of insulin: Secondary | ICD-10-CM | POA: Diagnosis not present

## 2021-06-13 DIAGNOSIS — T8611 Kidney transplant rejection: Secondary | ICD-10-CM | POA: Diagnosis not present

## 2021-06-13 DIAGNOSIS — Z940 Kidney transplant status: Secondary | ICD-10-CM | POA: Diagnosis not present

## 2021-06-13 DIAGNOSIS — Z4822 Encounter for aftercare following kidney transplant: Secondary | ICD-10-CM | POA: Diagnosis not present

## 2021-06-13 DIAGNOSIS — Z792 Long term (current) use of antibiotics: Secondary | ICD-10-CM | POA: Diagnosis not present

## 2021-06-13 DIAGNOSIS — I10 Essential (primary) hypertension: Secondary | ICD-10-CM | POA: Diagnosis not present

## 2021-06-13 DIAGNOSIS — Z79899 Other long term (current) drug therapy: Secondary | ICD-10-CM | POA: Diagnosis not present

## 2021-06-13 DIAGNOSIS — E119 Type 2 diabetes mellitus without complications: Secondary | ICD-10-CM | POA: Diagnosis not present

## 2021-06-13 DIAGNOSIS — Z7982 Long term (current) use of aspirin: Secondary | ICD-10-CM | POA: Diagnosis not present

## 2021-06-13 DIAGNOSIS — E785 Hyperlipidemia, unspecified: Secondary | ICD-10-CM | POA: Diagnosis not present

## 2021-06-15 NOTE — Progress Notes (Signed)
Subjective: °Brandon Carroll is a 61 y.o. male patient seen today for follow up of  at risk foot care with h/o NIDDM, is s/p kidney transplant and is on chronic immunosuppressive therapy.  Patient is seen today for and callus(es) of both feet and painful thick toenails that are difficult to trim. Painful toenails interfere with ambulation. Aggravating factors include wearing enclosed shoe gear. Pain is relieved with periodic professional debridement. Painful calluses are aggravated when weightbearing with and without shoegear. Pain is relieved with periodic professional debridement..  ° °Patient states their blood glucose was 151 mg/dl today. ° °New problem(s)/concern(s) today: None   ° °PCP is Crawford, Elizabeth A, MD. Last visit was: 01/27/2021. ° °Allergies  °Allergen Reactions  ° Lipitor [Atorvastatin] Hives and Itching  ° ° °Objective: °Physical Exam ° °General: Patient is a pleasant 61 y.o. African American male WD, WN in NAD. AAO x 3.  ° °Neurovascular Examination: °CFT immediate b/l LE. Palpable DP/PT pulses b/l LE. Digital hair present b/l. Skin temperature gradient WNL b/l. No pain with calf compression b/l. No edema noted b/l. No cyanosis or clubbing noted b/l LE. ° °Protective sensation intact 5/5 intact bilaterally with 10g monofilament b/l. ° °Dermatological:  °Pedal integument with normal turgor, texture and tone b/l LE. No open wounds b/l. No interdigital macerations b/l. Toenails 1-5 b/l elongated, thickened, discolored with subungual debris. +Tenderness with dorsal palpation of nailplates. Hyperkeratotic lesion(s) noted plantar heel pad of both feet, submet head 5 left foot, and sub 5th met base b/l lower extremities. ° °Musculoskeletal:  °Normal muscle strength 5/5 to all lower extremity muscle groups bilaterally. HAV with bunion deformity noted b/l LE. Adductovarus deformity bilateral 5th toes.. No pain, crepitus or joint limitation noted with ROM b/l LE.  Patient ambulates independently  without assistive aids. ° °Assessment: °1. Pain due to onychomycosis of toenail   °2. Callus   °3. Type 2 diabetes mellitus with diabetic nephropathy, with long-term current use of insulin (HCC)   °4. Deceased-donor kidney transplant   °5. Immunosuppression (HCC)   ° °Plan: °Patient was evaluated and treated and all questions answered. °Consent given for treatment as described below: °-No new findings. No new orders. °-Toenails 1-5 b/l were debrided in length and girth with sterile nail nippers and dremel without iatrogenic bleeding.  °-Callus(es) plantar heel pad of both feet, submet head 5 left foot, and sub 5th met base both feet pared utilizing sterile scalpel blade without complication or incident. Total number debrided =5. °-Patient/POA to call should there be question/concern in the interim. ° °Return in about 3 months (around 09/10/2021). ° °Jennifer L [place]way, DPM °

## 2021-06-22 DIAGNOSIS — T8619 Other complication of kidney transplant: Secondary | ICD-10-CM | POA: Diagnosis not present

## 2021-06-22 DIAGNOSIS — T8611 Kidney transplant rejection: Secondary | ICD-10-CM | POA: Diagnosis not present

## 2021-06-22 DIAGNOSIS — R600 Localized edema: Secondary | ICD-10-CM | POA: Diagnosis not present

## 2021-06-22 DIAGNOSIS — D649 Anemia, unspecified: Secondary | ICD-10-CM | POA: Diagnosis not present

## 2021-06-22 DIAGNOSIS — E872 Acidosis, unspecified: Secondary | ICD-10-CM | POA: Diagnosis not present

## 2021-06-22 DIAGNOSIS — D849 Immunodeficiency, unspecified: Secondary | ICD-10-CM | POA: Diagnosis not present

## 2021-06-22 DIAGNOSIS — Z940 Kidney transplant status: Secondary | ICD-10-CM | POA: Diagnosis not present

## 2021-06-22 DIAGNOSIS — Z794 Long term (current) use of insulin: Secondary | ICD-10-CM | POA: Diagnosis not present

## 2021-06-22 DIAGNOSIS — I10 Essential (primary) hypertension: Secondary | ICD-10-CM | POA: Diagnosis not present

## 2021-06-22 DIAGNOSIS — E1121 Type 2 diabetes mellitus with diabetic nephropathy: Secondary | ICD-10-CM | POA: Diagnosis not present

## 2021-06-22 DIAGNOSIS — Z7952 Long term (current) use of systemic steroids: Secondary | ICD-10-CM | POA: Diagnosis not present

## 2021-06-22 DIAGNOSIS — Z792 Long term (current) use of antibiotics: Secondary | ICD-10-CM | POA: Diagnosis not present

## 2021-06-22 DIAGNOSIS — B259 Cytomegaloviral disease, unspecified: Secondary | ICD-10-CM | POA: Diagnosis not present

## 2021-06-22 DIAGNOSIS — Z4822 Encounter for aftercare following kidney transplant: Secondary | ICD-10-CM | POA: Diagnosis not present

## 2021-06-22 DIAGNOSIS — E119 Type 2 diabetes mellitus without complications: Secondary | ICD-10-CM | POA: Diagnosis not present

## 2021-06-22 DIAGNOSIS — E785 Hyperlipidemia, unspecified: Secondary | ICD-10-CM | POA: Diagnosis not present

## 2021-06-22 DIAGNOSIS — I871 Compression of vein: Secondary | ICD-10-CM | POA: Diagnosis not present

## 2021-06-22 DIAGNOSIS — Z79899 Other long term (current) drug therapy: Secondary | ICD-10-CM | POA: Diagnosis not present

## 2021-06-29 ENCOUNTER — Ambulatory Visit (INDEPENDENT_AMBULATORY_CARE_PROVIDER_SITE_OTHER): Payer: Medicare Other | Admitting: Endocrinology

## 2021-06-29 ENCOUNTER — Other Ambulatory Visit: Payer: Self-pay

## 2021-06-29 VITALS — BP 130/74 | HR 78 | Ht 69.0 in | Wt 201.0 lb

## 2021-06-29 DIAGNOSIS — B259 Cytomegaloviral disease, unspecified: Secondary | ICD-10-CM | POA: Diagnosis not present

## 2021-06-29 DIAGNOSIS — I871 Compression of vein: Secondary | ICD-10-CM | POA: Diagnosis not present

## 2021-06-29 DIAGNOSIS — Z794 Long term (current) use of insulin: Secondary | ICD-10-CM

## 2021-06-29 DIAGNOSIS — T8619 Other complication of kidney transplant: Secondary | ICD-10-CM | POA: Diagnosis not present

## 2021-06-29 DIAGNOSIS — Z79899 Other long term (current) drug therapy: Secondary | ICD-10-CM | POA: Diagnosis not present

## 2021-06-29 DIAGNOSIS — Z7969 Long term (current) use of other immunomodulators and immunosuppressants: Secondary | ICD-10-CM | POA: Diagnosis not present

## 2021-06-29 DIAGNOSIS — Z940 Kidney transplant status: Secondary | ICD-10-CM | POA: Diagnosis not present

## 2021-06-29 DIAGNOSIS — I10 Essential (primary) hypertension: Secondary | ICD-10-CM | POA: Diagnosis not present

## 2021-06-29 DIAGNOSIS — Z7952 Long term (current) use of systemic steroids: Secondary | ICD-10-CM | POA: Diagnosis not present

## 2021-06-29 DIAGNOSIS — E1121 Type 2 diabetes mellitus with diabetic nephropathy: Secondary | ICD-10-CM

## 2021-06-29 DIAGNOSIS — D649 Anemia, unspecified: Secondary | ICD-10-CM | POA: Diagnosis not present

## 2021-06-29 DIAGNOSIS — E119 Type 2 diabetes mellitus without complications: Secondary | ICD-10-CM | POA: Diagnosis not present

## 2021-06-29 DIAGNOSIS — D849 Immunodeficiency, unspecified: Secondary | ICD-10-CM | POA: Diagnosis not present

## 2021-06-29 DIAGNOSIS — Z792 Long term (current) use of antibiotics: Secondary | ICD-10-CM | POA: Diagnosis not present

## 2021-06-29 DIAGNOSIS — Z4822 Encounter for aftercare following kidney transplant: Secondary | ICD-10-CM | POA: Diagnosis not present

## 2021-06-29 DIAGNOSIS — T8611 Kidney transplant rejection: Secondary | ICD-10-CM | POA: Diagnosis not present

## 2021-06-29 DIAGNOSIS — E785 Hyperlipidemia, unspecified: Secondary | ICD-10-CM | POA: Diagnosis not present

## 2021-06-29 LAB — POCT GLYCOSYLATED HEMOGLOBIN (HGB A1C): Hemoglobin A1C: 9.2 % — AB (ref 4.0–5.6)

## 2021-06-29 MED ORDER — INSULIN LISPRO (1 UNIT DIAL) 100 UNIT/ML (KWIKPEN)
15.0000 [IU] | PEN_INJECTOR | Freq: Every day | SUBCUTANEOUS | 3 refills | Status: DC
Start: 2021-06-29 — End: 2021-07-28

## 2021-06-29 NOTE — Progress Notes (Signed)
Subjective:    Patient ID: Brandon Carroll, male    DOB: Aug 27, 1960, 61 y.o.   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   Dx'ed: 3235 Complications: ESRD (transplant 2022).   Therapy: insulin since 2014.   DKA: never.  Severe hypoglycemia: once (early 2020).   Pancreatitis: never.  SDOH: he is on QD insulin, due to h/o noncompliance and pattern of cbg's.   Other: fructosamine converts to slightly higher A1c than cbg's and A1c itself; he changed Lantus to NPH, then 75/25, due to pattern of cbg's; He stopped Ozempic (abd cramps). Interval history: I reviewed continuous glucose monitor data.  Pt says glucose varies from 80-400.  It is in general highest at 10PM, and lowest at [facility].  It decreases overnight.  Pt says he never misses the insulin.  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  He does not know how long he will take.   Past Medical History:  Diagnosis Date   A-V fistula ([place]) 03/12/2019   Abnormal CT of the chest 06/08/2015   Abnormal echocardiogram 57/32/2025   Acute systolic CHF (congestive heart failure), NYHA class 2 ([place]) 05/04/2015   Acute-on-chronic kidney injury ([place]) 05/04/2015   Anemia 06/06/2015   Anemia in chronic kidney disease 11/18/2018   Body mass index (BMI) 27.0-27.9, adult 08/13/7060   Chronic systolic CHF (congestive heart failure) ([place]) 06/15/2015   Coronary artery disease 02/17/2018   Cardiac catheterization 2017 showing 10% LAD and 10% RCA Echocardiogram and stress test done in December 2019 showed evidence of old small inferior lateral myocardial infarction   Diabetes ([place]) 06/20/2019   Diabetes mellitus    Diarrhea, unspecified 10/22/2018   Dyspnea    Encounter for general adult medical examination with abnormal findings 06/12/2019   Erectile dysfunction    ESRD (end stage renal disease) on dialysis [facility])    Essential hypertension 07/03/2016   Fluid  overload, unspecified 01/26/2019   Hyperlipidemia    Hyperlipidemia associated with type 2 diabetes mellitus ([place]) 09/18/2011   Started statin therapy 09/2011.  Anticipate improvement as glucose control improves.    Hypertension    Iron deficiency anemia, unspecified 11/03/2018   Low testosterone 10/04/2015   12/01/2015 visit with Festus Aloe, MD at [facility].    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   Secondary hyperparathyroidism ([place]) 10/20/2018    Past Surgical History:  Procedure Laterality Date   AV FISTULA PLACEMENT Left 05/26/2018   Procedure: ARTERIOVENOUS (AV) FISTULA CREATION LEFT ARM;  Surgeon: Marty Heck, MD;  Location: [place];  Service: Vascular;  Laterality: Left;   CARDIAC CATHETERIZATION N/A 06/15/2015   Procedure: Left Heart Cath and Coronary Angiography;  Surgeon: Peter M Martinique, MD;  Location: [place] CV LAB;  Service: Cardiovascular;  Laterality: N/A;   CHOLECYSTECTOMY N/A 06/28/2017   Procedure: LAPAROSCOPIC CHOLECYSTECTOMY;  Surgeon: Ralene Ok, MD;  [facility];  Service: General;  Laterality: N/A;   HERNIA REPAIR  7th grade   umbilical   LEFT HEART CATH AND CORONARY ANGIOGRAPHY N/A 04/28/2019   Procedure: LEFT HEART CATH AND CORONARY ANGIOGRAPHY;  Surgeon: Leonie Man, MD;  Location: [place] [facility];  Service: Cardiovascular;  Laterality: N/A;  Left Kidney Transplant  07/12/2020    Social History   Socioeconomic History   Marital status: Divorced    Spouse name: n/a   Number of children: 1   Years of education: 12+   Highest education level: Not on file  Occupational History   Occupation: Building services engineer: [employer]  Tobacco Use   Smoking status: Never    Smokeless tobacco: Never  Vaping Use   Vaping Use: Never used  Substance and Sexual Activity   Alcohol use: No    Alcohol/week: 0.0 standard drinks   Drug use: No   Sexual activity: Not Currently    Comment: Erectile dysfunciotn  Other Topics Concern   Not on file  Social History Narrative   Divorced. Education: [employer].    Unable to exercise due to significant SOB and DOE.    Lives alone.   Son lives in [place].   Social Determinants of Health   Financial Resource Strain: Not on file  Food Insecurity: Not on file  Transportation Needs: Not on file  Physical Activity: Not on file  Stress: Not on file  Social Connections: Not on file  Intimate Partner Violence: Not on file    Current Outpatient Medications on File Prior to Visit  Medication Sig Dispense Refill   albuterol (PROVENTIL) (2.5 MG/3ML) 0.083% nebulizer solution Take 3 mLs (2.5 mg total) by nebulization every 6 (six) hours as needed for wheezing or shortness of breath. 75 mL 5   aspirin 81 MG EC tablet Take 1 tablet by mouth daily.     aspirin EC 81 MG tablet Take 81 mg by mouth daily.     BD PEN NEEDLE NANO 2ND GEN 32G X 4 MM MISC USE DAILY WITH INSULIN (Patient taking differently: 1 application by Other route as needed (Use with insulin).) 100 each 0   carvedilol (COREG) 6.25 MG tablet Take by mouth.     Continuous Blood Gluc Receiver (FREESTYLE LIBRE 2 READER) DEVI See admin instructions.     Continuous Blood Gluc Sensor (FREESTYLE LIBRE 14 DAY SENSOR) MISC 1 Device by Other route every 14 (fourteen) days. (Patient taking differently: 1 Device by Other route every 14 (fourteen) days. Glucose monitoring) 2 each 11   glucose blood (ONETOUCH VERIO) test strip 1 each by Other route 2 (two) times daily as needed for other. And lancets 2/day (Patient taking differently: 1 each by Other route 2 (two) times daily as needed for other (GLucose check). And lancets 2/day) 100 each 12   hydrALAZINE (APRESOLINE) 25 MG tablet  TAKE 1 TABLET(25 MG) BY MOUTH THREE TIMES DAILY (Patient taking differently: Take 25 mg by mouth 3 (three) times daily.) 270 tablet 0   hydrALAZINE (APRESOLINE) 25 MG tablet Take by mouth.     insulin isophane & regular human KwikPen (HUMULIN 70/30 MIX) (70-30) 100 UNIT/ML KwikPen Inject 70 Units into the skin daily with breakfast. And pen needles 1/day 75 mL 3   magnesium oxide (MAG-OX) 400 MG tablet Take 2 tablets by mouth daily.     mycophenolate (MYFORTIC) 180 MG EC tablet Take 180 mg by mouth 2 (two) times daily.     NIFEdipine (PROCARDIA-XL/NIFEDICAL-XL) 30 MG 24 hr tablet Take 2 tablets by mouth daily.     pantoprazole (PROTONIX) 40 MG tablet Take 1 tablet by mouth daily.     polyethylene glycol powder (GLYCOLAX/MIRALAX) 17 GM/SCOOP powder Take 17 g by mouth as needed for constipation.     predniSONE (DELTASONE) 5 MG  tablet Take 1 tablet by mouth daily.     sulfamethoxazole-trimethoprim (BACTRIM) 400-80 MG tablet Take 1 tablet by mouth 3 (three) times a week. Monday, Wednesday's and Friday     tacrolimus (PROGRAF) 1 MG capsule Take 1 mg by mouth 2 (two) times daily.     tacrolimus (PROGRAF) 1 MG capsule Take by mouth.     valGANciclovir (VALCYTE) 450 MG tablet Take 900 mg by mouth 2 (two) times daily.     isosorbide mononitrate (IMDUR) 60 MG 24 hr tablet Take by mouth.     pantoprazole (PROTONIX) 40 MG tablet Take 40 mg by mouth daily.     No current facility-administered medications on file prior to visit.    Allergies  Allergen Reactions   Lipitor [Atorvastatin] Hives and Itching    Family History  Problem Relation Age of Onset   Arthritis Mother    COPD Mother    Diabetes Mother        was thin, took insulin   Hypertension Mother    Hypertension Father    Arthritis Sister        rheumatoid   COPD Sister    Diabetes Brother    Colon cancer Neg Hx     BP 130/74    Pulse 78    Ht 5\' 9"  (1.753 m)    Wt 201 lb (91.2 kg)    SpO2 99%    BMI 29.68 kg/m   Review of  Systems     Objective:   Physical Exam VITAL SIGNS:  See vs page GENERAL: no distress Pulses: dorsalis pedis intact bilat.   MSK: no deformity of the feet CV: no leg edema Skin:  no ulcer on the feet.  normal color and temp on the feet. Neuro: sensation is intact to touch on the feet.    Lab Results  Component Value Date   HGBA1C 9.2 (A) 06/29/2021      Assessment & Plan:  Insulin-requiring type 2 DM: uncontrolled  Patient Instructions  I have sent a prescription to add humalog, 15 units with supper Please continue the same morning insulin On this type of insulin schedule, you should eat meals on a regular schedule.  If a meal is missed or significantly delayed, your blood sugar could go low.  Please come back for a follow-up appointment in 1 month.

## 2021-06-29 NOTE — Patient Instructions (Addendum)
I have sent a prescription to add humalog, 15 units with supper Please continue the same morning insulin On this type of insulin schedule, you should eat meals on a regular schedule.  If a meal is missed or significantly delayed, your blood sugar could go low.  Please come back for a follow-up appointment in 1 month.

## 2021-07-11 DIAGNOSIS — Z79899 Other long term (current) drug therapy: Secondary | ICD-10-CM | POA: Diagnosis not present

## 2021-07-11 DIAGNOSIS — Z940 Kidney transplant status: Secondary | ICD-10-CM | POA: Diagnosis not present

## 2021-07-11 DIAGNOSIS — E1121 Type 2 diabetes mellitus with diabetic nephropathy: Secondary | ICD-10-CM | POA: Diagnosis not present

## 2021-07-11 DIAGNOSIS — R82998 Other abnormal findings in urine: Secondary | ICD-10-CM | POA: Diagnosis not present

## 2021-07-11 DIAGNOSIS — B259 Cytomegaloviral disease, unspecified: Secondary | ICD-10-CM | POA: Diagnosis not present

## 2021-07-11 DIAGNOSIS — R7989 Other specified abnormal findings of blood chemistry: Secondary | ICD-10-CM | POA: Diagnosis not present

## 2021-07-11 DIAGNOSIS — T8619 Other complication of kidney transplant: Secondary | ICD-10-CM | POA: Diagnosis not present

## 2021-07-11 DIAGNOSIS — I10 Essential (primary) hypertension: Secondary | ICD-10-CM | POA: Diagnosis not present

## 2021-07-11 DIAGNOSIS — T8611 Kidney transplant rejection: Secondary | ICD-10-CM | POA: Diagnosis not present

## 2021-07-11 DIAGNOSIS — I871 Compression of vein: Secondary | ICD-10-CM | POA: Diagnosis not present

## 2021-07-11 DIAGNOSIS — D849 Immunodeficiency, unspecified: Secondary | ICD-10-CM | POA: Diagnosis not present

## 2021-07-18 DIAGNOSIS — I10 Essential (primary) hypertension: Secondary | ICD-10-CM | POA: Diagnosis not present

## 2021-07-18 DIAGNOSIS — T8611 Kidney transplant rejection: Secondary | ICD-10-CM | POA: Diagnosis not present

## 2021-07-18 DIAGNOSIS — E785 Hyperlipidemia, unspecified: Secondary | ICD-10-CM | POA: Diagnosis not present

## 2021-07-18 DIAGNOSIS — Z79899 Other long term (current) drug therapy: Secondary | ICD-10-CM | POA: Diagnosis not present

## 2021-07-18 DIAGNOSIS — E1121 Type 2 diabetes mellitus with diabetic nephropathy: Secondary | ICD-10-CM | POA: Diagnosis not present

## 2021-07-18 DIAGNOSIS — I5022 Chronic systolic (congestive) heart failure: Secondary | ICD-10-CM | POA: Diagnosis not present

## 2021-07-18 DIAGNOSIS — T8619 Other complication of kidney transplant: Secondary | ICD-10-CM | POA: Diagnosis not present

## 2021-07-18 DIAGNOSIS — Z794 Long term (current) use of insulin: Secondary | ICD-10-CM | POA: Diagnosis not present

## 2021-07-18 DIAGNOSIS — D849 Immunodeficiency, unspecified: Secondary | ICD-10-CM | POA: Diagnosis not present

## 2021-07-18 DIAGNOSIS — Z7952 Long term (current) use of systemic steroids: Secondary | ICD-10-CM | POA: Diagnosis not present

## 2021-07-18 DIAGNOSIS — I871 Compression of vein: Secondary | ICD-10-CM | POA: Diagnosis not present

## 2021-07-18 DIAGNOSIS — E1165 Type 2 diabetes mellitus with hyperglycemia: Secondary | ICD-10-CM | POA: Diagnosis not present

## 2021-07-18 DIAGNOSIS — Z940 Kidney transplant status: Secondary | ICD-10-CM | POA: Diagnosis not present

## 2021-07-24 ENCOUNTER — Ambulatory Visit (INDEPENDENT_AMBULATORY_CARE_PROVIDER_SITE_OTHER): Payer: Medicare Other | Admitting: Pulmonary Disease

## 2021-07-24 ENCOUNTER — Ambulatory Visit (INDEPENDENT_AMBULATORY_CARE_PROVIDER_SITE_OTHER): Payer: Medicare Other

## 2021-07-24 ENCOUNTER — Other Ambulatory Visit: Payer: Self-pay

## 2021-07-24 ENCOUNTER — Encounter: Payer: Self-pay | Admitting: Pulmonary Disease

## 2021-07-24 VITALS — BP 148/72 | HR 71 | Temp 98.5°F | Ht 70.0 in | Wt 204.0 lb

## 2021-07-24 DIAGNOSIS — R0609 Other forms of dyspnea: Secondary | ICD-10-CM | POA: Diagnosis not present

## 2021-07-24 DIAGNOSIS — D869 Sarcoidosis, unspecified: Secondary | ICD-10-CM | POA: Diagnosis not present

## 2021-07-24 NOTE — Patient Instructions (Signed)
X CXR today 

## 2021-07-24 NOTE — Progress Notes (Signed)
? ?  Subjective:  ? ? Patient ID: Brandon Carroll, male    DOB: 10-Aug-1960, 61 y.o.   MRN: [id number] ? ?HPI ? ?61 yo never smoker for follow-up of sarcoid w/ mediastinal adenopathy   ?-never been biopsied, elev ACE level ?- former patient of Dr. Lenna Gilford  ?  ?PMH-insulin requiring diabetes,  nonischemic cardiomyopathy and hypertension ?LHC 04/2019 showed multivessel non -sig disease ?ESRD on HD M/W/F since 10/2018, -renal transplant 2297, complicated by CMV infection and acute rejection ? ?We reviewed and discussed course of his renal transplant , last transplant office visit was reviewed. ?Chest x-ray 07/2020 was clear ? ? ?Significant tests/ events reviewed ?PFTs 06/2019 -no airway obstruction, FVC 85%, TLC 57% DLCO 17.5/63% ?  ? ?01/2020 Ct chest wo con >> multiple small pulmonary nodules scattered throughout the lungs, which are stable compared to 12/2016  ? ? ?12/2016 CT chest without contrast No substantial interval change compared to 2017 the mediastinal and bilateral hilar lymphadenopathy is similar to prior. Scattered parenchymal and mainly perifissural nodularity also with similar appearance. Dominant 6 mm right upper lobe nodule is unchanged. ?  ?05/2018 echo > grade 2 diastolic dysfunction, PA pressure not well estimated ?05/2018 VQ scan negative ?  ?Spirometry 05/2015 ratio 78, FEV1 79%, FVC 81% ? ? ?Review of Systems ?neg for any significant sore throat, dysphagia, itching, sneezing, nasal congestion or excess/ purulent secretions, fever, chills, sweats, unintended wt loss, pleuritic or exertional cp, hempoptysis, orthopnea pnd or change in chronic leg swelling. Also denies presyncope, palpitations, heartburn, abdominal pain, nausea, vomiting, diarrhea or change in bowel or urinary habits, dysuria,hematuria, rash, arthralgias, visual complaints, headache, numbness weakness or ataxia. ? ?   ?Objective:  ? Physical Exam ? ?Gen. Pleasant, obese, in no distress ?ENT - no lesions, no post nasal drip, steroid  facies ?Neck: No JVD, no thyromegaly, no carotid bruits ?Lungs: no use of accessory muscles, no dullness to percussion, decreased without rales or rhonchi  ?Cardiovascular: Rhythm regular, heart sounds  normal, no murmurs or gallops, no peripheral edema ?Musculoskeletal: No deformities, no cyanosis or clubbing , no tremors ? ? ? ? ?   ?Assessment & Plan:  ? ? ?

## 2021-07-24 NOTE — Assessment & Plan Note (Signed)
Chest x-ray was obtained and reviewed which shows clear lungs, no significant hilar lymphadenopathy. ?While he is on antirejection medications including prednisone and mycophenolate I doubt that sarcoidosis will be an issue. ?He is not symptomatic in terms of shortness of breath so we will hold off on PFTs ?

## 2021-07-27 DIAGNOSIS — T8611 Kidney transplant rejection: Secondary | ICD-10-CM | POA: Diagnosis not present

## 2021-07-27 DIAGNOSIS — N269 Renal sclerosis, unspecified: Secondary | ICD-10-CM | POA: Diagnosis not present

## 2021-07-27 DIAGNOSIS — Z940 Kidney transplant status: Secondary | ICD-10-CM | POA: Diagnosis not present

## 2021-07-27 DIAGNOSIS — N261 Atrophy of kidney (terminal): Secondary | ICD-10-CM | POA: Diagnosis not present

## 2021-07-27 DIAGNOSIS — I701 Atherosclerosis of renal artery: Secondary | ICD-10-CM | POA: Diagnosis not present

## 2021-07-27 DIAGNOSIS — T8619 Other complication of kidney transplant: Secondary | ICD-10-CM | POA: Diagnosis not present

## 2021-07-27 DIAGNOSIS — Z4822 Encounter for aftercare following kidney transplant: Secondary | ICD-10-CM | POA: Diagnosis not present

## 2021-07-28 ENCOUNTER — Other Ambulatory Visit: Payer: Self-pay

## 2021-07-28 ENCOUNTER — Encounter: Payer: Self-pay | Admitting: Endocrinology

## 2021-07-28 ENCOUNTER — Ambulatory Visit (INDEPENDENT_AMBULATORY_CARE_PROVIDER_SITE_OTHER): Payer: Medicare Other | Admitting: Endocrinology

## 2021-07-28 VITALS — BP 156/78 | HR 76 | Ht 70.0 in | Wt 206.0 lb

## 2021-07-28 DIAGNOSIS — Z794 Long term (current) use of insulin: Secondary | ICD-10-CM | POA: Diagnosis not present

## 2021-07-28 DIAGNOSIS — E1121 Type 2 diabetes mellitus with diabetic nephropathy: Secondary | ICD-10-CM

## 2021-07-28 MED ORDER — INSULIN LISPRO (1 UNIT DIAL) 100 UNIT/ML (KWIKPEN): 20.0000 [IU] | PEN_INJECTOR | Freq: Every day | SUBCUTANEOUS | 3 refills | Status: DC

## 2021-07-28 MED ORDER — INSULIN ISOPHANE & REGULAR (HUMAN 70-30)100 UNIT/ML KWIKPEN: 65.0000 [IU] | PEN_INJECTOR | Freq: Every day | SUBCUTANEOUS | 3 refills | Status: DC

## 2021-07-28 NOTE — Progress Notes (Signed)
? ?Subjective:  ? ? Patient ID: Brandon Carroll, male    DOB: 1960-07-02, 61 y.o.   MRN: [id number] ? 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   ?Dx'ed: 1996 ?Complications: ESRD (transplant 2022).   ?Therapy: insulin since 2014.   ?DKA: never.  ?Severe hypoglycemia: once (early 2020).   ?Pancreatitis: never.  ?SDOH: he is on QD insulin, due to h/o noncompliance and pattern of cbg's.   ?Other: fructosamine converts to slightly higher A1c than cbg's and A1c itself; he changed Lantus to NPH, then 75/25, due to pattern of cbg's; He stopped Ozempic (abd cramps). ?Interval history: I reviewed continuous glucose monitor data.  Pt says glucose varies from 60-330.  It is in general highest at 8PM, and lowest at 7AM.  It increases 3PM-8PM.  It decreases overnight.  Pt says he never misses the insulin.  Prednisone (for graft rejection) is down to 10 mg qd.  he also received IV steroids.  He does not know how long he will be on steroids. He has mild hypoglycemia approx twice a week.  This happens during the day.   ?Past Medical History:  ?Diagnosis Date  ? A-V fistula ([place]) 03/12/2019  ? Abnormal CT of the chest 06/08/2015  ? Abnormal echocardiogram 04/28/2019  ? Acute systolic CHF (congestive heart failure), NYHA class 2 ([place]) 05/04/2015  ? Acute-on-chronic kidney injury ([place]) 05/04/2015  ? Anemia 06/06/2015  ? Anemia in chronic kidney disease 11/18/2018  ? Body mass index (BMI) 27.0-27.9, adult 10/20/2018  ? Chronic systolic CHF (congestive heart failure) ([place]) 06/15/2015  ? Coronary artery disease 02/17/2018  ? Cardiac catheterization 2017 showing 10% LAD and 10% RCA Echocardiogram and stress test done in December 2019 showed evidence of old small inferior lateral myocardial infarction  ? Diabetes ([place]) 06/20/2019  ? Diabetes mellitus   ? Diarrhea, unspecified 10/22/2018  ? Dyspnea   ? Encounter for general adult medical examination with abnormal findings  06/12/2019  ? Erectile dysfunction   ? ESRD (end stage renal disease) on dialysis [facility])   ? Essential hypertension 07/03/2016  ? Fluid overload, unspecified 01/26/2019  ? Hyperlipidemia   ? Hyperlipidemia associated with type 2 diabetes mellitus ([place]) 09/18/2011  ? Started statin therapy 09/2011.  Anticipate improvement as glucose control improves.   ? Hypertension   ? Iron deficiency anemia, unspecified 11/03/2018  ? Low testosterone 10/04/2015  ? 12/01/2015 visit with Festus Aloe, MD at [facility].   ? Mild protein-calorie malnutrition ([place]) 11/14/2018  ? Pain, unspecified 10/22/2018  ? Pruritus, unspecified 10/22/2018  ? Sarcoidosis 07/02/2016  ? Presumptive diagnosis, never biopsied, based on mediastinal lymphadenopathy, nodularity and elevated ACE level  ? 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 Secondary hyperparathyroidism ([place]) 10/20/2018  ? ? ?Past Surgical History:  ?Procedure Laterality Date  ? AV FISTULA PLACEMENT Left 05/26/2018  ? Procedure: ARTERIOVENOUS (AV) FISTULA CREATION LEFT ARM;  Surgeon: Marty Heck, MD;  Location: [place];  Service: Vascular;  Laterality: Left;  ? CARDIAC CATHETERIZATION N/A 06/15/2015  ? Procedure: Left Heart Cath and Coronary Angiography;  Surgeon: Peter M Martinique, MD;  Location: [place] [facility];  Service: Cardiovascular;  Laterality: N/A;  ? CHOLECYSTECTOMY N/A 06/28/2017  ? Procedure: LAPAROSCOPIC CHOLECYSTECTOMY;  Surgeon: Ralene Ok, MD;  [facility];  Service: General;  Laterality: N/A;  ? HERNIA REPAIR  7th grade  ? umbilical  ? LEFT HEART CATH AND CORONARY ANGIOGRAPHY N/A 04/28/2019  ?  Procedure: LEFT HEART CATH AND CORONARY ANGIOGRAPHY;  Surgeon: Leonie Man, MD;  Location: [place] [facility];  Service: Cardiovascular;  Laterality: N/A;  ? Left Kidney Transplant  07/12/2020  ? ? ?Social History  ? ?Socioeconomic History  ? Marital status: Divorced  ?  Spouse name: n/a  ? Number of children: 1  ? Years of education: 12+  ? Highest education level:  Not on file  ?Occupational History  ? Occupation: Animal nutritionist  ?  Employer: [employer]  ?Tobacco Use  ? Smoking status: Never  ? Smokeless tobacco: Never  ?Vaping Use  ? Vaping Use: Never used  ?Substance and Sexual Activity  ? Alcohol use: No  ?  Alcohol/week: 0.0 standard drinks  ? Drug use: No  ? Sexual activity: Not Currently  ?  Comment: Erectile dysfunciotn  ?Other Topics Concern  ? Not on file  ?Social History Narrative  ? Divorced. Education: [employer].   ? Unable to exercise due to significant SOB and DOE.   ? Lives alone.  ? Son lives in [place].  ? ?Social Determinants of Health  ? ?Financial Resource Strain: Not on file  ?Food Insecurity: Not on file  ?Transportation Needs: Not on file  ?Physical Activity: Not on file  ?Stress: Not on file  ?Social Connections: Not on file  ?Intimate Partner Violence: Not on file  ? ? ?Current Outpatient Medications on File Prior to Visit  ?Medication Sig Dispense Refill  ? albuterol (PROVENTIL) (2.5 MG/3ML) 0.083% nebulizer solution Take 3 mLs (2.5 mg total) by nebulization every 6 (six) hours as needed for wheezing or shortness of breath. 75 mL 5  ? aspirin 81 MG EC tablet Take 1 tablet by mouth daily.    ? aspirin EC 81 MG tablet Take 81 mg by mouth daily.    ? BD PEN NEEDLE NANO 2ND GEN 32G X 4 MM MISC USE DAILY WITH INSULIN (Patient taking differently: 1 application. by Other route as needed (Use with insulin).) 100 each 0  ? carvedilol (COREG) 6.25 MG tablet Take by mouth.    ? Continuous Blood Gluc Receiver (FREESTYLE LIBRE 2 READER) DEVI See admin instructions.    ? Continuous Blood Gluc Sensor (FREESTYLE LIBRE 14 DAY SENSOR) MISC 1 Device by Other route every 14 (fourteen) days. (Patient taking differently: 1 Device by Other route every 14 (fourteen) days. Glucose monitoring) 2 each 11  ? glucose blood (ONETOUCH VERIO) test strip 1 each by Other route 2 (two) times daily as needed for other. And lancets 2/day (Patient taking differently: 1 each by Other route  2 (two) times daily as needed for other (GLucose check). And lancets 2/day) 100 each 12  ? hydrALAZINE (APRESOLINE) 25 MG tablet TAKE 1 TABLET(25 MG) BY MOUTH THREE TIMES DAILY (Patient taking differently: Take 25 mg by mouth 3 (three) times daily.) 270 tablet 0  ? hydrALAZINE (APRESOLINE) 25 MG tablet Take by mouth.    ? magnesium oxide (MAG-OX) 400 MG tablet Take 2 tablets by mouth daily.    ? mycophenolate (MYFORTIC) 180 MG EC tablet Take 180 mg by mouth 2 (two) times daily.    ? NIFEdipine (PROCARDIA-XL/NIFEDICAL-XL) 30 MG 24 hr tablet Take 2 tablets by mouth daily.    ? pantoprazole (PROTONIX) 40 MG tablet Take 1 tablet by mouth daily.    ? polyethylene glycol powder (GLYCOLAX/MIRALAX) 17 GM/SCOOP powder Take 17 g by mouth as needed for constipation.    ? predniSONE (DELTASONE) 5 MG tablet Take 1 tablet  by mouth daily.    ? tacrolimus (PROGRAF) 1 MG capsule Take 1 mg by mouth 2 (two) times daily.    ? tacrolimus (PROGRAF) 1 MG capsule Take by mouth. 3 in the morning and 3 in the evening.    ? valGANciclovir (VALCYTE) 450 MG tablet Take 900 mg by mouth 2 (two) times daily.    ? isosorbide mononitrate (IMDUR) 60 MG 24 hr tablet Take by mouth.    ? pantoprazole (PROTONIX) 40 MG tablet Take 40 mg by mouth daily.    ? ?No current facility-administered medications on file prior to visit.  ? ? ?Allergies  ?Allergen Reactions  ? Lipitor [Atorvastatin] Hives and Itching  ? ? ?Family History  ?Problem Relation Age of Onset  ? Arthritis Mother   ? COPD Mother   ? Diabetes Mother   ?     was thin, took insulin  ? Hypertension Mother   ? Hypertension Father   ? Arthritis Sister   ?     rheumatoid  ? COPD Sister   ? Diabetes Brother   ? Colon cancer Neg Hx   ? ? ?BP (!) 156/78 (BP Location: Left Arm, Patient Position: Sitting, Cuff Size: Normal)   Pulse 76   Ht 5\' 10"  (1.778 m)   Wt 206 lb (93.4 kg)   SpO2 97%   BMI 29.56 kg/m?  ? ? ?Review of Systems ? ?   ?Objective:  ? Physical Exam ?VITAL SIGNS:  See vs  page ?GENERAL: no distress ? ? ? ?Lab Results  ?Component Value Date  ? HGBA1C 9.2 (A) 06/29/2021  ? ? ?   ?Assessment & Plan:  ?Insulin-requiring type 2 DM: uncontrolled ?Hypoglycemia, due to insulin: The pattern

## 2021-07-28 NOTE — Patient Instructions (Addendum)
I have sent a prescription to increase the humalog to 20 units with supper.   ?Please please reduce the morning insulin to 65 units.   ?On this type of insulin schedule, you should eat meals on a regular schedule.  If a meal is missed or significantly delayed, your blood sugar could go low.  ?Please come back for a follow-up appointment in 1 month ?

## 2021-07-31 ENCOUNTER — Encounter: Payer: Self-pay | Admitting: Cardiology

## 2021-07-31 ENCOUNTER — Ambulatory Visit (INDEPENDENT_AMBULATORY_CARE_PROVIDER_SITE_OTHER): Payer: Medicare Other | Admitting: Cardiology

## 2021-07-31 ENCOUNTER — Other Ambulatory Visit: Payer: Self-pay

## 2021-07-31 VITALS — BP 150/82 | HR 76 | Ht 70.0 in | Wt 208.0 lb

## 2021-07-31 DIAGNOSIS — I2510 Atherosclerotic heart disease of native coronary artery without angina pectoris: Secondary | ICD-10-CM

## 2021-07-31 DIAGNOSIS — Z940 Kidney transplant status: Secondary | ICD-10-CM

## 2021-07-31 DIAGNOSIS — E1121 Type 2 diabetes mellitus with diabetic nephropathy: Secondary | ICD-10-CM | POA: Diagnosis not present

## 2021-07-31 DIAGNOSIS — Z794 Long term (current) use of insulin: Secondary | ICD-10-CM | POA: Diagnosis not present

## 2021-07-31 DIAGNOSIS — I10 Essential (primary) hypertension: Secondary | ICD-10-CM

## 2021-07-31 MED ORDER — HYDRALAZINE HCL 25 MG PO TABS: 25.0000 mg | ORAL_TABLET | Freq: Three times a day (TID) | ORAL | 3 refills | Status: DC

## 2021-07-31 NOTE — Patient Instructions (Addendum)
Medication Instructions:  ?Your physician recommends that you continue on your current medications as directed. Please refer to the Current Medication list given to you today. ? ?*If you need a refill on your cardiac medications before your next appointment, please call your pharmacy* ? ? ?Lab Work: ?None ?If you have labs (blood work) drawn today and your tests are completely normal, you will receive your results only by: ?MyChart Message (if you have MyChart) OR ?A paper copy in the mail ?If you have any lab test that is abnormal or we need to change your treatment, we will call you to review the results. ? ? ?Testing/Procedures: ?Your physician has requested that you have an echocardiogram. Echocardiography is a painless test that uses sound waves to create images of your heart. It provides your doctor with information about the size and shape of your heart and how well your heart?s chambers and valves are working. This procedure takes approximately one hour. There are no restrictions for this procedure. ? ? ? ?Follow-Up: ?At [facility], you and your health needs are our priority.  As part of our continuing mission to provide you with exceptional heart care, we have created designated Provider Care Teams.  These Care Teams include your primary Cardiologist (physician) and Advanced Practice Providers (APPs -  Physician Assistants and Nurse Practitioners) who all work together to provide you with the care you need, when you need it. ? ?We recommend signing up for the patient portal called "MyChart".  Sign up information is provided on this After Visit Summary.  MyChart is used to connect with patients for Virtual Visits (Telemedicine).  Patients are able to view lab/test results, encounter notes, upcoming appointments, etc.  Non-urgent messages can be sent to your provider as well.   ?To learn more about what you can do with MyChart, go to [email or web address].   ? ?Your next appointment:   ?4  month(s) ? ?The format for your next appointment:   ?In Person ? ?Provider:   ?Jenne Campus, MD  ? ? ?Other Instructions ?None ? ?

## 2021-07-31 NOTE — Telephone Encounter (Signed)
Hydralazine 25 mg # 270 x 3 refill sent to pharmacy, patient requested refill at his appt today with Dr Agustin Cree ?

## 2021-07-31 NOTE — Progress Notes (Signed)
Cardiology Office Note:    Date:  07/31/2021   ID:  Brandon Carroll, DOB [birth year], MRN [id number]  PCP:  Myrlene Broker, MD  Cardiologist:  Gypsy Balsam, MD    Referring MD: Myrlene Broker, *   No chief complaint on file. I am not doing well from kidney point review  History of Present Illness:    Brandon Carroll is a 61 y.o. male with past medical history significant for coronary artery disease, chronic renal failure, status post kidney transplant a year ago from cada[facility], he did have a cardiac catheterization done last evaluation before kidney transplant he was find to have moderate disease of ostial circumflex as well as proximal RCA no critical lesions was identified.  His past medical history also significant for type 2 diabetes.  Lately he has been having some difficulty with his kidney rejection he did have a multiple renal biopsies done he is very tired with this he sat he was thinking that kidney transplant would be difficult over oral surgery was fairly easy about follow-up with all procedures that he got on after that frustrates him.  Denies have any chest pain tightness squeezing pressure burning chest no cardiac complaint but he got multiple family members school recently and about a myocardial infarction he went to be checked make sure everything is fine.  He does not have any symptoms that would suggest activation of coronary artery disease he is not on statin we had this discussion before I wanted him to be on pravastatin however kidney transplant team felt that the situation getting very complicated right now with multiple issues going on and we probably need to wait few months before initiating that therapy.  His LDL was 88 I would like to see his LDL less than 70.  Past Medical History:  Diagnosis Date   A-V fistula (HCC) 03/12/2019   Abnormal CT of the chest 06/08/2015   Abnormal echocardiogram 04/28/2019   Acute systolic CHF (congestive heart  failure), NYHA class 2 (HCC) 05/04/2015   Acute-on-chronic kidney injury (HCC) 05/04/2015   Anemia 06/06/2015   Anemia in chronic kidney disease 11/18/2018   Body mass index (BMI) 27.0-27.9, adult 10/20/2018   Chronic systolic CHF (congestive heart failure) (HCC) 06/15/2015   Coronary artery disease 02/17/2018   Cardiac catheterization 2017 showing 10% LAD and 10% RCA Echocardiogram and stress test done in December 2019 showed evidence of old small inferior lateral myocardial infarction   Diabetes (HCC) 06/20/2019   Diabetes mellitus    Diarrhea, unspecified 10/22/2018   Dyspnea    Encounter for general adult medical examination with abnormal findings 06/12/2019   Erectile dysfunction    ESRD (end stage renal disease) on dialysis [facility])    Essential hypertension 07/03/2016   Fluid overload, unspecified 01/26/2019   Hyperlipidemia    Hyperlipidemia associated with type 2 diabetes mellitus (HCC) 09/18/2011   Started statin therapy 09/2011.  Anticipate improvement as glucose control improves.    Hypertension    Iron deficiency anemia, unspecified 11/03/2018   Low testosterone 10/04/2015   12/01/2015 visit with Jerilee Field, MD at [facility].    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   Secondary hyperparathyroidism (HCC) 10/20/2018    Past Surgical History:  Procedure Laterality Date   AV FISTULA PLACEMENT Left 05/26/2018  Procedure: ARTERIOVENOUS (AV) FISTULA CREATION LEFT ARM;  Surgeon: Cephus Shelling, MD;  Location: [facility];  Service: Vascular;  Laterality: Left;   CARDIAC CATHETERIZATION N/A 06/15/2015   Procedure: Left Heart Cath and Coronary Angiography;  Surgeon: Peter M Swaziland, MD;  Location: [facility] INVASIVE CV LAB;  Service:  Cardiovascular;  Laterality: N/A;   CHOLECYSTECTOMY N/A 06/28/2017   Procedure: LAPAROSCOPIC CHOLECYSTECTOMY;  Surgeon: Axel Filler, MD;  Location: [facility] ORS;  Service: General;  Laterality: N/A;   HERNIA REPAIR  7th grade   umbilical   LEFT HEART CATH AND CORONARY ANGIOGRAPHY N/A 04/28/2019   Procedure: LEFT HEART CATH AND CORONARY ANGIOGRAPHY;  Surgeon: Marykay Lex, MD;  Location: [facility] INVASIVE CV LAB;  Service: Cardiovascular;  Laterality: N/A;   Left Kidney Transplant  07/12/2020    Current Medications: Current Meds  Medication Sig   albuterol (PROVENTIL) (2.5 MG/3ML) 0.083% nebulizer solution Take 3 mLs (2.5 mg total) by nebulization every 6 (six) hours as needed for wheezing or shortness of breath.   aspirin 81 MG EC tablet Take 1 tablet by mouth daily.   BD PEN NEEDLE NANO 2ND GEN 32G X 4 MM MISC USE DAILY WITH INSULIN (Patient taking differently: 1 application. by Other route as needed (Use with insulin).)   carvedilol (COREG) 6.25 MG tablet Take by mouth.   Continuous Blood Gluc Receiver (FREESTYLE LIBRE 2 READER) DEVI See admin instructions.   Continuous Blood Gluc Sensor (FREESTYLE LIBRE 14 DAY SENSOR) MISC 1 Device by Other route every 14 (fourteen) days. (Patient taking differently: 1 Device by Other route every 14 (fourteen) days. Glucose monitoring)   glucose blood (ONETOUCH VERIO) test strip 1 each by Other route 2 (two) times daily as needed for other. And lancets 2/day (Patient taking differently: 1 each by Other route 2 (two) times daily as needed for other (GLucose check). And lancets 2/day)   insulin isophane & regular human KwikPen (HUMULIN 70/30 MIX) (70-30) 100 UNIT/ML KwikPen Inject 65 Units into the skin daily with breakfast. And pen needles 1/day   insulin lispro (HUMALOG KWIKPEN) 100 UNIT/ML KwikPen Inject 20 Units into the skin daily with supper. And pen needles 2/day   isosorbide mononitrate (IMDUR) 60 MG 24 hr tablet Take by mouth.   magnesium oxide (MAG-OX)  400 MG tablet Take 2 tablets by mouth daily.   mycophenolate (MYFORTIC) 180 MG EC tablet Take 180 mg by mouth 2 (two) times daily.   NIFEdipine (PROCARDIA-XL/NIFEDICAL-XL) 30 MG 24 hr tablet Take 2 tablets by mouth daily.   pantoprazole (PROTONIX) 40 MG tablet Take 1 tablet by mouth daily.   polyethylene glycol powder (GLYCOLAX/MIRALAX) 17 GM/SCOOP powder Take 17 g by mouth as needed for constipation.   predniSONE (DELTASONE) 5 MG tablet Take 1 tablet by mouth daily.   tacrolimus (PROGRAF) 1 MG capsule Take by mouth. 6 in the morning and 6 in the evening.   valGANciclovir (VALCYTE) 450 MG tablet Take 900 mg by mouth 2 (two) times daily.   [DISCONTINUED] hydrALAZINE (APRESOLINE) 25 MG tablet TAKE 1 TABLET(25 MG) BY MOUTH THREE TIMES DAILY (Patient taking differently: Take 25 mg by mouth 3 (three) times daily.)     Allergies:   Lipitor [atorvastatin]   Social History   Socioeconomic History   Marital status: Divorced    Spouse name: n/a   Number of children: 1   Years of education: 12+   Highest education level: Not on file  Occupational History   Occupation: Event organiser:  [employer]  Tobacco Use   Smoking status: Never    Passive exposure: Never   Smokeless tobacco: Never  Vaping Use   Vaping Use: Never used  Substance and Sexual Activity   Alcohol use: No    Alcohol/week: 0.0 standard drinks   Drug use: No   Sexual activity: Not Currently    Comment: Erectile dysfunciotn  Other Topics Concern   Not on file  Social History Narrative   Divorced. Education: [employer].    Unable to exercise due to significant SOB and DOE.    Lives alone.   Son lives in [place].   Social Determinants of Health   Financial Resource Strain: Not on file  Food Insecurity: Not on file  Transportation Needs: Not on file  Physical Activity: Not on file  Stress: Not on file  Social Connections: Not on file     Family History: The patient's family history includes Arthritis in  his mother and sister; COPD in his mother and sister; Diabetes in his brother and mother; Hypertension in his father and mother. There is no history of Colon cancer. ROS:   Please see the history of present illness.    All 14 point review of systems negative except as described per history of present illness  EKGs/Labs/Other Studies Reviewed:      Recent Labs: No results found for requested labs within last 8760 hours.  Recent Lipid Panel    Component Value Date/Time   CHOL 197 09/24/2017 0823   CHOL 305 (H) 03/21/2017 1124   TRIG 245.0 (H) 09/24/2017 0823   HDL 45.60 09/24/2017 0823   HDL 60 03/21/2017 1124   CHOLHDL 4 09/24/2017 0823   VLDL 49.0 (H) 09/24/2017 0823   LDLCALC 212 (H) 03/21/2017 1124   LDLDIRECT 97 04/26/2021 1025   LDLDIRECT 106.0 09/24/2017 0823    Physical Exam:    VS:  BP (!) 150/82 (BP Location: Right Arm)   Pulse 76   Ht 5\' 10"  (1.778 m)   Wt 208 lb (94.3 kg)   SpO2 98%   BMI 29.84 kg/m     Wt Readings from Last 3 Encounters:  07/31/21 208 lb (94.3 kg)  07/28/21 206 lb (93.4 kg)  07/24/21 204 lb (92.5 kg)     GEN:  Well nourished, well developed in no acute distress HEENT: Normal NECK: No JVD; No carotid bruits LYMPHATICS: No lymphadenopathy CARDIAC: RRR, no murmurs, no rubs, no gallops RESPIRATORY:  Clear to auscultation without rales, wheezing or rhonchi  ABDOMEN: Soft, non-tender, non-distended MUSCULOSKELETAL:  No edema; No deformity  SKIN: Warm and dry LOWER EXTREMITIES: no swelling NEUROLOGIC:  Alert and oriented x 3 PSYCHIATRIC:  Normal affect   ASSESSMENT:    1. Coronary artery disease involving native coronary artery of native heart without angina pectoris   2. Primary hypertension   3. Type 2 diabetes mellitus with diabetic nephropathy, with long-term current use of insulin (HCC)   4. Deceased-donor kidney transplant    PLAN:    In order of problems listed above:  Coronary artery disease moderate disease based on  cardiac catheterization from 2020.  He is on antiplatelet therapy which I will continue.  I wish to put him on a statin however discussion look above.  He denies having any chest pain tightness squeezing pressure burning chest no symptoms that would suggest reactivation of the problem. Essential hypertension blood pressure slightly elevated we will do refill on his hydralazine.  He checked his blood pressure on the regular  basis home usually 130/80 and below. Type 2 diabetes lately became difficult to control because of multiple steroid usage. Kidney failure status post kidney transplant followed by transplant team.   Medication Adjustments/Labs and Tests Ordered: Current medicines are reviewed at length with the patient today.  Concerns regarding medicines are outlined above.  No orders of the defined types were placed in this encounter.  Medication changes: No orders of the defined types were placed in this encounter.   Signed, Georgeanna Lea, MD, [facility] 07/31/2021 1:17 PM    [facility]

## 2021-08-01 DIAGNOSIS — I10 Essential (primary) hypertension: Secondary | ICD-10-CM | POA: Diagnosis not present

## 2021-08-01 DIAGNOSIS — Z7969 Long term (current) use of other immunomodulators and immunosuppressants: Secondary | ICD-10-CM | POA: Diagnosis not present

## 2021-08-01 DIAGNOSIS — Z4822 Encounter for aftercare following kidney transplant: Secondary | ICD-10-CM | POA: Diagnosis not present

## 2021-08-01 DIAGNOSIS — E1121 Type 2 diabetes mellitus with diabetic nephropathy: Secondary | ICD-10-CM | POA: Diagnosis not present

## 2021-08-01 DIAGNOSIS — Z5181 Encounter for therapeutic drug level monitoring: Secondary | ICD-10-CM | POA: Diagnosis not present

## 2021-08-01 DIAGNOSIS — Z940 Kidney transplant status: Secondary | ICD-10-CM | POA: Diagnosis not present

## 2021-08-01 DIAGNOSIS — E785 Hyperlipidemia, unspecified: Secondary | ICD-10-CM | POA: Diagnosis not present

## 2021-08-01 DIAGNOSIS — Z8619 Personal history of other infectious and parasitic diseases: Secondary | ICD-10-CM | POA: Diagnosis not present

## 2021-08-01 DIAGNOSIS — E1165 Type 2 diabetes mellitus with hyperglycemia: Secondary | ICD-10-CM | POA: Diagnosis not present

## 2021-08-01 DIAGNOSIS — D649 Anemia, unspecified: Secondary | ICD-10-CM | POA: Diagnosis not present

## 2021-08-01 DIAGNOSIS — Z79899 Other long term (current) drug therapy: Secondary | ICD-10-CM | POA: Diagnosis not present

## 2021-08-01 DIAGNOSIS — Z7952 Long term (current) use of systemic steroids: Secondary | ICD-10-CM | POA: Diagnosis not present

## 2021-08-01 DIAGNOSIS — Z794 Long term (current) use of insulin: Secondary | ICD-10-CM | POA: Diagnosis not present

## 2021-08-01 DIAGNOSIS — Z79621 Long term (current) use of calcineurin inhibitor: Secondary | ICD-10-CM | POA: Diagnosis not present

## 2021-08-01 DIAGNOSIS — D849 Immunodeficiency, unspecified: Secondary | ICD-10-CM | POA: Diagnosis not present

## 2021-08-01 DIAGNOSIS — Z792 Long term (current) use of antibiotics: Secondary | ICD-10-CM | POA: Diagnosis not present

## 2021-08-07 DIAGNOSIS — R112 Nausea with vomiting, unspecified: Secondary | ICD-10-CM | POA: Diagnosis not present

## 2021-08-07 DIAGNOSIS — R197 Diarrhea, unspecified: Secondary | ICD-10-CM | POA: Diagnosis not present

## 2021-08-07 DIAGNOSIS — R509 Fever, unspecified: Secondary | ICD-10-CM | POA: Diagnosis not present

## 2021-08-07 DIAGNOSIS — J40 Bronchitis, not specified as acute or chronic: Secondary | ICD-10-CM | POA: Diagnosis not present

## 2021-08-09 DIAGNOSIS — U071 COVID-19: Secondary | ICD-10-CM | POA: Diagnosis not present

## 2021-08-09 DIAGNOSIS — R051 Acute cough: Secondary | ICD-10-CM | POA: Diagnosis not present

## 2021-08-14 ENCOUNTER — Ambulatory Visit (HOSPITAL_BASED_OUTPATIENT_CLINIC_OR_DEPARTMENT_OTHER): Payer: Medicare Other

## 2021-08-21 ENCOUNTER — Telehealth: Payer: Self-pay | Admitting: Internal Medicine

## 2021-08-21 NOTE — Telephone Encounter (Signed)
Left message for patient to call back to schedule Medicare Annual Wellness Visit  ? ?No hx of AWV eligible as of 01/13/20 ? ?Please schedule at anytime with [facility] if patient calls the office back.   ? ? ?Any questions, please call me at [phone]  ?

## 2021-08-30 DIAGNOSIS — Z794 Long term (current) use of insulin: Secondary | ICD-10-CM | POA: Diagnosis not present

## 2021-08-30 DIAGNOSIS — Z7969 Long term (current) use of other immunomodulators and immunosuppressants: Secondary | ICD-10-CM | POA: Diagnosis not present

## 2021-08-30 DIAGNOSIS — E785 Hyperlipidemia, unspecified: Secondary | ICD-10-CM | POA: Diagnosis not present

## 2021-08-30 DIAGNOSIS — Z4822 Encounter for aftercare following kidney transplant: Secondary | ICD-10-CM | POA: Diagnosis not present

## 2021-08-30 DIAGNOSIS — Z79899 Other long term (current) drug therapy: Secondary | ICD-10-CM | POA: Diagnosis not present

## 2021-08-30 DIAGNOSIS — B259 Cytomegaloviral disease, unspecified: Secondary | ICD-10-CM | POA: Diagnosis not present

## 2021-08-30 DIAGNOSIS — I10 Essential (primary) hypertension: Secondary | ICD-10-CM | POA: Diagnosis not present

## 2021-08-30 DIAGNOSIS — E119 Type 2 diabetes mellitus without complications: Secondary | ICD-10-CM | POA: Diagnosis not present

## 2021-08-30 DIAGNOSIS — D649 Anemia, unspecified: Secondary | ICD-10-CM | POA: Diagnosis not present

## 2021-08-30 DIAGNOSIS — Z792 Long term (current) use of antibiotics: Secondary | ICD-10-CM | POA: Diagnosis not present

## 2021-08-30 DIAGNOSIS — D849 Immunodeficiency, unspecified: Secondary | ICD-10-CM | POA: Diagnosis not present

## 2021-08-30 DIAGNOSIS — E1121 Type 2 diabetes mellitus with diabetic nephropathy: Secondary | ICD-10-CM | POA: Diagnosis not present

## 2021-08-30 DIAGNOSIS — Z940 Kidney transplant status: Secondary | ICD-10-CM | POA: Diagnosis not present

## 2021-08-30 DIAGNOSIS — Z7952 Long term (current) use of systemic steroids: Secondary | ICD-10-CM | POA: Diagnosis not present

## 2021-08-31 DIAGNOSIS — E11319 Type 2 diabetes mellitus with unspecified diabetic retinopathy without macular edema: Secondary | ICD-10-CM | POA: Diagnosis not present

## 2021-08-31 DIAGNOSIS — H40013 Open angle with borderline findings, low risk, bilateral: Secondary | ICD-10-CM | POA: Diagnosis not present

## 2021-08-31 DIAGNOSIS — H524 Presbyopia: Secondary | ICD-10-CM | POA: Diagnosis not present

## 2021-08-31 DIAGNOSIS — H25813 Combined forms of age-related cataract, bilateral: Secondary | ICD-10-CM | POA: Diagnosis not present

## 2021-09-05 ENCOUNTER — Ambulatory Visit (HOSPITAL_BASED_OUTPATIENT_CLINIC_OR_DEPARTMENT_OTHER)
Admission: RE | Admit: 2021-09-05 | Discharge: 2021-09-05 | Disposition: A | Discharge status: Home / Self Care | Payer: Medicare Other | Source: Ambulatory Visit | Attending: Cardiology | Admitting: Cardiology

## 2021-09-05 DIAGNOSIS — Z940 Kidney transplant status: Secondary | ICD-10-CM | POA: Diagnosis not present

## 2021-09-05 DIAGNOSIS — E1121 Type 2 diabetes mellitus with diabetic nephropathy: Secondary | ICD-10-CM | POA: Insufficient documentation

## 2021-09-05 DIAGNOSIS — Z794 Long term (current) use of insulin: Secondary | ICD-10-CM | POA: Insufficient documentation

## 2021-09-05 DIAGNOSIS — R0609 Other forms of dyspnea: Secondary | ICD-10-CM | POA: Diagnosis not present

## 2021-09-05 DIAGNOSIS — I10 Essential (primary) hypertension: Secondary | ICD-10-CM | POA: Insufficient documentation

## 2021-09-05 DIAGNOSIS — I2510 Atherosclerotic heart disease of native coronary artery without angina pectoris: Secondary | ICD-10-CM | POA: Insufficient documentation

## 2021-09-05 LAB — ECHOCARDIOGRAM COMPLETE
AR max vel: 2.34 cm2
AV Area VTI: 2.15 cm2
AV Area mean vel: 2.06 cm2
AV Mean grad: 4 mmHg
AV Peak grad: 6.7 mmHg
Ao pk vel: 1.29 m/s
Area-P 1/2: 3.89 cm2
Calc EF: 45.9 %
S' Lateral: 4.1 cm
Single Plane A2C EF: 48.9 %
Single Plane A4C EF: 39.8 %

## 2021-09-05 NOTE — Progress Notes (Signed)
?  Echocardiogram ?2D Echocardiogram has been performed. ? ?Elmer Ramp ?09/05/2021, 2:00 PM ?

## 2021-09-08 ENCOUNTER — Ambulatory Visit (INDEPENDENT_AMBULATORY_CARE_PROVIDER_SITE_OTHER): Payer: Medicare Other | Admitting: Endocrinology

## 2021-09-08 VITALS — BP 140/80 | HR 76 | Ht 70.0 in | Wt 203.2 lb

## 2021-09-08 DIAGNOSIS — Z794 Long term (current) use of insulin: Secondary | ICD-10-CM

## 2021-09-08 DIAGNOSIS — E1121 Type 2 diabetes mellitus with diabetic nephropathy: Secondary | ICD-10-CM

## 2021-09-08 LAB — POCT GLYCOSYLATED HEMOGLOBIN (HGB A1C): Hemoglobin A1C: 7.4 % — AB (ref 4.0–5.6)

## 2021-09-08 MED ORDER — INSULIN ISOPHANE & REGULAR (HUMAN 70-30)100 UNIT/ML KWIKPEN: 68.0000 [IU] | PEN_INJECTOR | Freq: Every day | SUBCUTANEOUS | 3 refills | Status: DC

## 2021-09-08 MED ORDER — INSULIN ISOPHANE & REGULAR (HUMAN 70-30)100 UNIT/ML KWIKPEN: 70.0000 [IU] | PEN_INJECTOR | Freq: Every day | SUBCUTANEOUS | 3 refills | Status: DC

## 2021-09-08 MED ORDER — INSULIN LISPRO (1 UNIT DIAL) 100 UNIT/ML (KWIKPEN): 5.0000 [IU] | PEN_INJECTOR | Freq: Every day | SUBCUTANEOUS | 3 refills | Status: DC

## 2021-09-08 NOTE — Progress Notes (Signed)
? ?Subjective:  ? ? Patient ID: Brandon Carroll, male    DOB: 04/24/1961, 61 y.o.   MRN: [id number] ? 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   ?Dx'ed: 1996 ?Complications: ESRD (transplant 2022).   ?Therapy: insulin since 2014.   ?DKA: never.  ?Severe hypoglycemia: once (early 2020).   ?Pancreatitis: never.  ?SDOH: Brandon Carroll is on QD insulin, due to h/o noncompliance and pattern of cbg's.   ?Other: fructosamine converts to slightly higher A1c than cbg's and A1c itself; Brandon Carroll changed Lantus to NPH, then 75/25, due to pattern of cbg's; Brandon Carroll stopped Ozempic (abd cramps). ?Interval history: I reviewed continuous glucose monitor data.  glucose varies from 55-340.  It is in general highest at [place], and less high at 11AM.  It is lowest at 1AM-6AM.   It decreases overnight.  Pt says Brandon Carroll never misses the insulin.  Prednisone (for graft rejection) is down to 5 mg qd.  Brandon Carroll also received IV steroids 1 month ago. Brandon Carroll has mild hypoglycemia approx 1/week.  This happens in the early hrs of the AM.   ?Past Medical History:  ?Diagnosis Date  ? A-V fistula ([place]) 03/12/2019  ? Abnormal CT of the chest 06/08/2015  ? Abnormal echocardiogram 04/28/2019  ? Acute systolic CHF (congestive heart failure), NYHA class 2 ([place]) 05/04/2015  ? Acute-on-chronic kidney injury ([place]) 05/04/2015  ? Anemia 06/06/2015  ? Anemia in chronic kidney disease 11/18/2018  ? Body mass index (BMI) 27.0-27.9, adult 10/20/2018  ? Chronic systolic CHF (congestive heart failure) ([place]) 06/15/2015  ? Coronary artery disease 02/17/2018  ? Cardiac catheterization 2017 showing 10% LAD and 10% RCA Echocardiogram and stress test done in December 2019 showed evidence of old small inferior lateral myocardial infarction  ? Diabetes ([place]) 06/20/2019  ? Diabetes mellitus   ? Diarrhea, unspecified 10/22/2018  ? Dyspnea   ? Encounter for general adult medical examination with abnormal findings 06/12/2019  ? Erectile dysfunction    ? ESRD (end stage renal disease) on dialysis [facility])   ? Essential hypertension 07/03/2016  ? Fluid overload, unspecified 01/26/2019  ? Hyperlipidemia   ? Hyperlipidemia associated with type 2 diabetes mellitus ([place]) 09/18/2011  ? Started statin therapy 09/2011.  Anticipate improvement as glucose control improves.   ? Hypertension   ? Iron deficiency anemia, unspecified 11/03/2018  ? Low testosterone 10/04/2015  ? 12/01/2015 visit with Festus Aloe, MD at [facility].   ? Mild protein-calorie malnutrition ([place]) 11/14/2018  ? Pain, unspecified 10/22/2018  ? Pruritus, unspecified 10/22/2018  ? Sarcoidosis 07/02/2016  ? Presumptive diagnosis, never biopsied, based on mediastinal lymphadenopathy, nodularity and elevated ACE level  ? Secondary cardiomyopathy ([place]) 06/08/2015  ? Secondary hyperparathyroidism ([place]) 10/20/2018  ? ? ?Past Surgical History:  ?Procedure Laterality Date  ? AV FISTULA PLACEMENT Left 05/26/2018  ? Procedure: ARTERIOVENOUS (AV) FISTULA CREATION LEFT ARM;  Surgeon: Marty Heck, MD;  Location: [place];  Service: Vascular;  Laterality: Left;  ? CARDIAC CATHETERIZATION N/A 06/15/2015  ? Procedure: Left Heart Cath and Coronary Angiography;  Surgeon: Peter M Martinique, MD;  Location: [place] CV LAB;  Service: Cardiovascular;  Laterality: N/A;  ? CHOLECYSTECTOMY N/A 06/28/2017  ? Procedure: LAPAROSCOPIC CHOLECYSTECTOMY;  Surgeon: Ralene Ok, MD;  Location: [facility] ORS;  Service: General;  Laterality: N/A;  ? HERNIA REPAIR  7th grade  ? umbilical  ? LEFT HEART CATH AND CORONARY ANGIOGRAPHY N/A 04/28/2019  ? Procedure: LEFT HEART CATH  AND CORONARY ANGIOGRAPHY;  Surgeon: Leonie Man, MD;  Location: [place] CV LAB;  Service: Cardiovascular;  Laterality: N/A;  ? Left Kidney Transplant  07/12/2020  ? ? ?Social History  ? ?Socioeconomic History  ? Marital status: Divorced  ?  Spouse name: n/a  ? Number of children: 1  ? Years of education: 12+  ? Highest education level: Not on file  ?Occupational  History  ? Occupation: Animal nutritionist  ?  Employer: [employer]  ?Tobacco Use  ? Smoking status: Never  ?  Passive exposure: Never  ? Smokeless tobacco: Never  ?Vaping Use  ? Vaping Use: Never used  ?Substance and Sexual Activity  ? Alcohol use: No  ?  Alcohol/week: 0.0 standard drinks  ? Drug use: No  ? Sexual activity: Not Currently  ?  Comment: Erectile dysfunciotn  ?Other Topics Concern  ? Not on file  ?Social History Narrative  ? Divorced. Education: [employer].   ? Unable to exercise due to significant SOB and DOE.   ? Lives alone.  ? Son lives in [place].  ? ?Social Determinants of Health  ? ?Financial Resource Strain: Not on file  ?Food Insecurity: Not on file  ?Transportation Needs: Not on file  ?Physical Activity: Not on file  ?Stress: Not on file  ?Social Connections: Not on file  ?Intimate Partner Violence: Not on file  ? ? ?Current Outpatient Medications on File Prior to Visit  ?Medication Sig Dispense Refill  ? albuterol (PROVENTIL) (2.5 MG/3ML) 0.083% nebulizer solution Take 3 mLs (2.5 mg total) by nebulization every 6 (six) hours as needed for wheezing or shortness of breath. 75 mL 5  ? aspirin 81 MG EC tablet Take 1 tablet by mouth daily.    ? BD PEN NEEDLE NANO 2ND GEN 32G X 4 MM MISC USE DAILY WITH INSULIN (Patient taking differently: 1 application. by Other route as needed (Use with insulin).) 100 each 0  ? carvedilol (COREG) 6.25 MG tablet Take by mouth.    ? Continuous Blood Gluc Receiver (FREESTYLE LIBRE 2 READER) DEVI See admin instructions.    ? Continuous Blood Gluc Sensor (FREESTYLE LIBRE 14 DAY SENSOR) MISC 1 Device by Other route every 14 (fourteen) days. (Patient taking differently: 1 Device by Other route every 14 (fourteen) days. Glucose monitoring) 2 each 11  ? glucose blood (ONETOUCH VERIO) test strip 1 each by Other route 2 (two) times daily as needed for other. And lancets 2/day (Patient taking differently: 1 each by Other route 2 (two) times daily as needed for other (GLucose  check). And lancets 2/day) 100 each 12  ? hydrALAZINE (APRESOLINE) 25 MG tablet Take 1 tablet (25 mg total) by mouth 3 (three) times daily. TAKE 1 TABLET(25 MG) BY MOUTH THREE TIMES DAILY Strength: 25 mg 270 tablet 3  ? magnesium oxide (MAG-OX) 400 MG tablet Take 2 tablets by mouth daily.    ? mycophenolate (MYFORTIC) 180 MG EC tablet Take 180 mg by mouth 2 (two) times daily.    ? NIFEdipine (PROCARDIA-XL/NIFEDICAL-XL) 30 MG 24 hr tablet Take 2 tablets by mouth daily.    ? pantoprazole (PROTONIX) 40 MG tablet Take 1 tablet by mouth daily.    ? polyethylene glycol powder (GLYCOLAX/MIRALAX) 17 GM/SCOOP powder Take 17 g by mouth as needed for constipation.    ? predniSONE (DELTASONE) 5 MG tablet Take 1 tablet by mouth daily.    ? tacrolimus (PROGRAF) 1 MG capsule Take by mouth. 6 in the morning and 6 in  the evening.    ? valGANciclovir (VALCYTE) 450 MG tablet Take 900 mg by mouth 2 (two) times daily.    ? isosorbide mononitrate (IMDUR) 60 MG 24 hr tablet Take by mouth.    ? ?No current facility-administered medications on file prior to visit.  ? ? ?Allergies  ?Allergen Reactions  ? Lipitor [Atorvastatin] Hives and Itching  ? ? ?Family History  ?Problem Relation Age of Onset  ? Arthritis Mother   ? COPD Mother   ? Diabetes Mother   ?     was thin, took insulin  ? Hypertension Mother   ? Hypertension Father   ? Arthritis Sister   ?     rheumatoid  ? COPD Sister   ? Diabetes Brother   ? Colon cancer Neg Hx   ? ? ?BP 140/80 (BP Location: Left Arm, Patient Position: Sitting, Cuff Size: Normal)   Pulse 76   Ht 5\' 10"  (1.778 m)   Wt 203 lb 3.2 oz (92.2 kg)   SpO2 98%   BMI 29.16 kg/m?  ? ? ?Review of Systems ? ?   ?Objective:  ? Physical Exam ?VITAL SIGNS:  See vs page.   ?GENERAL: no distress.   ? ? ?Lab Results  ?Component Value Date  ? HGBA1C 7.4 (A) 09/08/2021  ? ?   ?Assessment & Plan:  ?Insulin-requiring type 2 DM ?Hypoglycemia, due to insulin: The pattern of his cbg's indicates Brandon Carroll needs some adjustment in his  therapy.   ? ?Patient Instructions  ?Please reduce the humalog to 5 units with supper, and please increase the morning (70/30) insulin to 70 units.   ?On this type of insulin schedule, you should eat meals on a regular

## 2021-09-08 NOTE — Patient Instructions (Addendum)
Please reduce the humalog to 5 units with supper, and please increase the morning (70/30) insulin to 70 units.   ?On this type of insulin schedule, you should eat meals on a regular schedule.  If a meal is missed or significantly delayed, your blood sugar could go low.  ?You should have an endocrinology follow-up appointment in 3 months.   ?

## 2021-09-11 ENCOUNTER — Ambulatory Visit (INDEPENDENT_AMBULATORY_CARE_PROVIDER_SITE_OTHER): Payer: Self-pay | Admitting: Podiatry

## 2021-09-11 DIAGNOSIS — Z91199 Patient's noncompliance with other medical treatment and regimen due to unspecified reason: Secondary | ICD-10-CM

## 2021-09-11 NOTE — Progress Notes (Signed)
   Complete physical exam  Patient: Brandon Carroll   DOB: 03/03/1997   61 y.o. Male  MRN: [id number]  Subjective:    No chief complaint on file.   Brandon Carroll is a 61 y.o. male who presents today for a complete physical exam. She reports consuming a {diet types:17450} diet. {types:19826} She generally feels {DESC; WELL/FAIRLY WELL/POORLY:18703}. She reports sleeping {DESC; WELL/FAIRLY WELL/POORLY:18703}. She {does/does not:200015} have additional problems to discuss today.    Most recent fall risk assessment:    11/08/2021   10:42 AM  Fall Risk   Falls in the past year? 0  Number falls in past yr: 0  Injury with Fall? 0  Risk for fall due to : No Fall Risks  Follow up Falls evaluation completed     Most recent depression screenings:    11/08/2021   10:42 AM 09/29/2020   10:46 AM  PHQ 2/9 Scores  PHQ - 2 Score 0 0  PHQ- 9 Score 5     {VISON DENTAL STD PSA (Optional):27386}  {History (Optional):23778}  Patient Care Team: Jessup, Joy, NP as PCP - General (Nurse Practitioner)   Outpatient Medications Prior to Visit  Medication Sig   fluticasone (FLONASE) 50 MCG/ACT nasal spray Place 2 sprays into both nostrils in the morning and at bedtime. After 7 days, reduce to once daily.   norgestimate-ethinyl estradiol (SPRINTEC 28) 0.25-35 MG-MCG tablet Take 1 tablet by mouth daily.   Nystatin POWD Apply liberally to affected area 2 times per day   spironolactone (ALDACTONE) 100 MG tablet Take 1 tablet (100 mg total) by mouth daily.   No facility-administered medications prior to visit.    ROS        Objective:     There were no vitals taken for this visit. {Vitals History (Optional):23777}  Physical Exam   No results found for any visits on 12/14/21. {Show previous labs (optional):23779}    Assessment & Plan:    Routine Health Maintenance and Physical Exam  Immunization History  Administered Date(s) Administered   DTaP 05/17/1999, 07/13/1999,  09/21/1999, 06/06/2000, 12/21/2003   Hepatitis A 10/17/2007, 10/22/2008   Hepatitis B 03/04/1999, 04/11/1999, 09/21/1999   HiB (PRP-OMP) 05/17/1999, 07/13/1999, 09/21/1999, 06/06/2000   IPV 05/17/1999, 07/13/1999, 03/11/2000, 12/21/2003   Influenza,inj,Quad PF,6+ Mos 01/22/2014   Influenza-Unspecified 04/23/2012   MMR 03/11/2001, 12/21/2003   Meningococcal Polysaccharide 10/22/2011   Pneumococcal Conjugate-13 06/06/2000   Pneumococcal-Unspecified 09/21/1999, 12/05/1999   Tdap 10/22/2011   Varicella 03/11/2000, 10/17/2007    Health Maintenance  Topic Date Due   HIV Screening  Never done   Hepatitis C Screening  Never done   INFLUENZA VACCINE  12/12/2021   PAP-Cervical Cytology Screening  12/14/2021 (Originally 03/02/2020)   PAP SMEAR-Modifier  12/14/2021 (Originally 03/02/2020)   TETANUS/TDAP  12/14/2021 (Originally 10/21/2021)   HPV VACCINES  Discontinued   COVID-19 Vaccine  Discontinued    Discussed health benefits of physical activity, and encouraged her to engage in regular exercise appropriate for her age and condition.  Problem List Items Addressed This Visit   None Visit Diagnoses     Annual physical exam    -  Primary   Cervical cancer screening       Need for Tdap vaccination          No follow-ups on file.     Joy Jessup, NP   

## 2021-09-13 DIAGNOSIS — Z7952 Long term (current) use of systemic steroids: Secondary | ICD-10-CM | POA: Diagnosis not present

## 2021-09-13 DIAGNOSIS — I132 Hypertensive heart and chronic kidney disease with heart failure and with stage 5 chronic kidney disease, or end stage renal disease: Secondary | ICD-10-CM | POA: Diagnosis not present

## 2021-09-13 DIAGNOSIS — Z992 Dependence on renal dialysis: Secondary | ICD-10-CM | POA: Diagnosis not present

## 2021-09-13 DIAGNOSIS — Z7969 Long term (current) use of other immunomodulators and immunosuppressants: Secondary | ICD-10-CM | POA: Diagnosis not present

## 2021-09-13 DIAGNOSIS — R768 Other specified abnormal immunological findings in serum: Secondary | ICD-10-CM | POA: Diagnosis not present

## 2021-09-13 DIAGNOSIS — I10 Essential (primary) hypertension: Secondary | ICD-10-CM | POA: Diagnosis not present

## 2021-09-13 DIAGNOSIS — T8611 Kidney transplant rejection: Secondary | ICD-10-CM | POA: Diagnosis not present

## 2021-09-13 DIAGNOSIS — Z5181 Encounter for therapeutic drug level monitoring: Secondary | ICD-10-CM | POA: Diagnosis not present

## 2021-09-13 DIAGNOSIS — E119 Type 2 diabetes mellitus without complications: Secondary | ICD-10-CM | POA: Diagnosis not present

## 2021-09-13 DIAGNOSIS — Z4822 Encounter for aftercare following kidney transplant: Secondary | ICD-10-CM | POA: Diagnosis not present

## 2021-09-13 DIAGNOSIS — Z792 Long term (current) use of antibiotics: Secondary | ICD-10-CM | POA: Diagnosis not present

## 2021-09-13 DIAGNOSIS — I5022 Chronic systolic (congestive) heart failure: Secondary | ICD-10-CM | POA: Diagnosis not present

## 2021-09-13 DIAGNOSIS — E1122 Type 2 diabetes mellitus with diabetic chronic kidney disease: Secondary | ICD-10-CM | POA: Diagnosis not present

## 2021-09-13 DIAGNOSIS — N186 End stage renal disease: Secondary | ICD-10-CM | POA: Diagnosis not present

## 2021-09-13 DIAGNOSIS — Z79899 Other long term (current) drug therapy: Secondary | ICD-10-CM | POA: Diagnosis not present

## 2021-09-27 DIAGNOSIS — Z79621 Long term (current) use of calcineurin inhibitor: Secondary | ICD-10-CM | POA: Diagnosis not present

## 2021-09-27 DIAGNOSIS — T8611 Kidney transplant rejection: Secondary | ICD-10-CM | POA: Diagnosis not present

## 2021-09-27 DIAGNOSIS — N186 End stage renal disease: Secondary | ICD-10-CM | POA: Diagnosis not present

## 2021-09-27 DIAGNOSIS — Z794 Long term (current) use of insulin: Secondary | ICD-10-CM | POA: Diagnosis not present

## 2021-09-27 DIAGNOSIS — R768 Other specified abnormal immunological findings in serum: Secondary | ICD-10-CM | POA: Diagnosis not present

## 2021-09-27 DIAGNOSIS — I10 Essential (primary) hypertension: Secondary | ICD-10-CM | POA: Diagnosis not present

## 2021-09-27 DIAGNOSIS — Z5181 Encounter for therapeutic drug level monitoring: Secondary | ICD-10-CM | POA: Diagnosis not present

## 2021-09-27 DIAGNOSIS — E785 Hyperlipidemia, unspecified: Secondary | ICD-10-CM | POA: Diagnosis not present

## 2021-09-27 DIAGNOSIS — Z940 Kidney transplant status: Secondary | ICD-10-CM | POA: Diagnosis not present

## 2021-09-27 DIAGNOSIS — D649 Anemia, unspecified: Secondary | ICD-10-CM | POA: Diagnosis not present

## 2021-09-27 DIAGNOSIS — E1121 Type 2 diabetes mellitus with diabetic nephropathy: Secondary | ICD-10-CM | POA: Diagnosis not present

## 2021-09-27 DIAGNOSIS — I5022 Chronic systolic (congestive) heart failure: Secondary | ICD-10-CM | POA: Diagnosis not present

## 2021-09-27 DIAGNOSIS — E119 Type 2 diabetes mellitus without complications: Secondary | ICD-10-CM | POA: Diagnosis not present

## 2021-09-27 DIAGNOSIS — D849 Immunodeficiency, unspecified: Secondary | ICD-10-CM | POA: Diagnosis not present

## 2021-09-27 DIAGNOSIS — I132 Hypertensive heart and chronic kidney disease with heart failure and with stage 5 chronic kidney disease, or end stage renal disease: Secondary | ICD-10-CM | POA: Diagnosis not present

## 2021-09-27 DIAGNOSIS — Z4822 Encounter for aftercare following kidney transplant: Secondary | ICD-10-CM | POA: Diagnosis not present

## 2021-09-27 DIAGNOSIS — Z992 Dependence on renal dialysis: Secondary | ICD-10-CM | POA: Diagnosis not present

## 2021-10-18 ENCOUNTER — Encounter: Payer: Self-pay | Admitting: Endocrinology

## 2021-10-25 DIAGNOSIS — Z940 Kidney transplant status: Secondary | ICD-10-CM | POA: Diagnosis not present

## 2021-10-25 DIAGNOSIS — R768 Other specified abnormal immunological findings in serum: Secondary | ICD-10-CM | POA: Diagnosis not present

## 2021-10-25 DIAGNOSIS — Z4822 Encounter for aftercare following kidney transplant: Secondary | ICD-10-CM | POA: Diagnosis not present

## 2021-10-25 DIAGNOSIS — E11649 Type 2 diabetes mellitus with hypoglycemia without coma: Secondary | ICD-10-CM | POA: Diagnosis not present

## 2021-10-25 DIAGNOSIS — Z794 Long term (current) use of insulin: Secondary | ICD-10-CM | POA: Diagnosis not present

## 2021-10-25 DIAGNOSIS — D849 Immunodeficiency, unspecified: Secondary | ICD-10-CM | POA: Diagnosis not present

## 2021-10-25 DIAGNOSIS — Z792 Long term (current) use of antibiotics: Secondary | ICD-10-CM | POA: Diagnosis not present

## 2021-10-25 DIAGNOSIS — E1121 Type 2 diabetes mellitus with diabetic nephropathy: Secondary | ICD-10-CM | POA: Diagnosis not present

## 2021-10-25 DIAGNOSIS — I878 Other specified disorders of veins: Secondary | ICD-10-CM | POA: Diagnosis not present

## 2021-10-25 DIAGNOSIS — D649 Anemia, unspecified: Secondary | ICD-10-CM | POA: Diagnosis not present

## 2021-10-25 DIAGNOSIS — B259 Cytomegaloviral disease, unspecified: Secondary | ICD-10-CM | POA: Diagnosis not present

## 2021-10-25 DIAGNOSIS — I10 Essential (primary) hypertension: Secondary | ICD-10-CM | POA: Diagnosis not present

## 2021-10-25 DIAGNOSIS — Z7952 Long term (current) use of systemic steroids: Secondary | ICD-10-CM | POA: Diagnosis not present

## 2021-10-25 DIAGNOSIS — B349 Viral infection, unspecified: Secondary | ICD-10-CM | POA: Diagnosis not present

## 2021-10-25 DIAGNOSIS — N281 Cyst of kidney, acquired: Secondary | ICD-10-CM | POA: Diagnosis not present

## 2021-10-25 DIAGNOSIS — Z79621 Long term (current) use of calcineurin inhibitor: Secondary | ICD-10-CM | POA: Diagnosis not present

## 2021-10-25 DIAGNOSIS — E785 Hyperlipidemia, unspecified: Secondary | ICD-10-CM | POA: Diagnosis not present

## 2021-10-25 DIAGNOSIS — Z79899 Other long term (current) drug therapy: Secondary | ICD-10-CM | POA: Diagnosis not present

## 2021-10-25 DIAGNOSIS — Z79624 Long term (current) use of inhibitors of nucleotide synthesis: Secondary | ICD-10-CM | POA: Diagnosis not present

## 2021-10-25 DIAGNOSIS — N261 Atrophy of kidney (terminal): Secondary | ICD-10-CM | POA: Diagnosis not present

## 2021-11-06 ENCOUNTER — Telehealth: Payer: Self-pay | Admitting: Internal Medicine

## 2021-11-07 ENCOUNTER — Telehealth: Payer: Self-pay | Admitting: Internal Medicine

## 2021-11-10 DIAGNOSIS — Z4822 Encounter for aftercare following kidney transplant: Secondary | ICD-10-CM | POA: Diagnosis not present

## 2021-11-22 DIAGNOSIS — B259 Cytomegaloviral disease, unspecified: Secondary | ICD-10-CM | POA: Diagnosis not present

## 2021-11-22 DIAGNOSIS — Z940 Kidney transplant status: Secondary | ICD-10-CM | POA: Diagnosis not present

## 2021-11-22 DIAGNOSIS — D849 Immunodeficiency, unspecified: Secondary | ICD-10-CM | POA: Diagnosis not present

## 2021-11-22 DIAGNOSIS — Z4822 Encounter for aftercare following kidney transplant: Secondary | ICD-10-CM | POA: Diagnosis not present

## 2021-11-22 DIAGNOSIS — E119 Type 2 diabetes mellitus without complications: Secondary | ICD-10-CM | POA: Diagnosis not present

## 2021-11-22 DIAGNOSIS — E785 Hyperlipidemia, unspecified: Secondary | ICD-10-CM | POA: Diagnosis not present

## 2021-11-22 DIAGNOSIS — Z79899 Other long term (current) drug therapy: Secondary | ICD-10-CM | POA: Diagnosis not present

## 2021-11-22 DIAGNOSIS — D649 Anemia, unspecified: Secondary | ICD-10-CM | POA: Diagnosis not present

## 2021-11-22 DIAGNOSIS — I10 Essential (primary) hypertension: Secondary | ICD-10-CM | POA: Diagnosis not present

## 2021-11-27 ENCOUNTER — Telehealth: Payer: Self-pay | Admitting: Internal Medicine

## 2021-11-27 NOTE — Telephone Encounter (Signed)
Brandon Carroll called in stating pt has a procedure coming up soon- requesting [place].   Please fax to: [phone]

## 2021-11-28 NOTE — Telephone Encounter (Signed)
Last office notes have been faxed over.

## 2021-12-07 DIAGNOSIS — B259 Cytomegaloviral disease, unspecified: Secondary | ICD-10-CM | POA: Diagnosis not present

## 2021-12-07 DIAGNOSIS — Z940 Kidney transplant status: Secondary | ICD-10-CM | POA: Diagnosis not present

## 2021-12-21 ENCOUNTER — Ambulatory Visit (INDEPENDENT_AMBULATORY_CARE_PROVIDER_SITE_OTHER): Payer: Medicare Other | Admitting: Endocrinology

## 2021-12-21 ENCOUNTER — Encounter: Payer: Self-pay | Admitting: Endocrinology

## 2021-12-21 VITALS — BP 122/70 | HR 69 | Ht 69.0 in | Wt 193.6 lb

## 2021-12-21 DIAGNOSIS — R079 Chest pain, unspecified: Secondary | ICD-10-CM | POA: Diagnosis not present

## 2021-12-21 DIAGNOSIS — Z4822 Encounter for aftercare following kidney transplant: Secondary | ICD-10-CM | POA: Diagnosis not present

## 2021-12-21 DIAGNOSIS — E1165 Type 2 diabetes mellitus with hyperglycemia: Secondary | ICD-10-CM

## 2021-12-21 DIAGNOSIS — E1121 Type 2 diabetes mellitus with diabetic nephropathy: Secondary | ICD-10-CM

## 2021-12-21 DIAGNOSIS — Z888 Allergy status to other drugs, medicaments and biological substances status: Secondary | ICD-10-CM | POA: Diagnosis not present

## 2021-12-21 DIAGNOSIS — E11319 Type 2 diabetes mellitus with unspecified diabetic retinopathy without macular edema: Secondary | ICD-10-CM | POA: Diagnosis not present

## 2021-12-21 DIAGNOSIS — Z7952 Long term (current) use of systemic steroids: Secondary | ICD-10-CM | POA: Diagnosis not present

## 2021-12-21 DIAGNOSIS — B258 Other cytomegaloviral diseases: Secondary | ICD-10-CM | POA: Diagnosis not present

## 2021-12-21 DIAGNOSIS — R5383 Other fatigue: Secondary | ICD-10-CM | POA: Diagnosis not present

## 2021-12-21 DIAGNOSIS — R9431 Abnormal electrocardiogram [ECG] [EKG]: Secondary | ICD-10-CM | POA: Diagnosis not present

## 2021-12-21 DIAGNOSIS — Z79621 Long term (current) use of calcineurin inhibitor: Secondary | ICD-10-CM | POA: Diagnosis not present

## 2021-12-21 DIAGNOSIS — I110 Hypertensive heart disease with heart failure: Secondary | ICD-10-CM | POA: Diagnosis not present

## 2021-12-21 DIAGNOSIS — I517 Cardiomegaly: Secondary | ICD-10-CM | POA: Diagnosis not present

## 2021-12-21 DIAGNOSIS — Z794 Long term (current) use of insulin: Secondary | ICD-10-CM | POA: Diagnosis not present

## 2021-12-21 DIAGNOSIS — I5022 Chronic systolic (congestive) heart failure: Secondary | ICD-10-CM | POA: Diagnosis not present

## 2021-12-21 DIAGNOSIS — B259 Cytomegaloviral disease, unspecified: Secondary | ICD-10-CM | POA: Diagnosis not present

## 2021-12-21 DIAGNOSIS — R059 Cough, unspecified: Secondary | ICD-10-CM | POA: Diagnosis not present

## 2021-12-21 DIAGNOSIS — Z79624 Long term (current) use of inhibitors of nucleotide synthesis: Secondary | ICD-10-CM | POA: Diagnosis not present

## 2021-12-21 DIAGNOSIS — D649 Anemia, unspecified: Secondary | ICD-10-CM | POA: Diagnosis not present

## 2021-12-21 DIAGNOSIS — R0609 Other forms of dyspnea: Secondary | ICD-10-CM | POA: Diagnosis not present

## 2021-12-21 DIAGNOSIS — E119 Type 2 diabetes mellitus without complications: Secondary | ICD-10-CM | POA: Diagnosis not present

## 2021-12-21 DIAGNOSIS — D8989 Other specified disorders involving the immune mechanism, not elsewhere classified: Secondary | ICD-10-CM | POA: Diagnosis not present

## 2021-12-21 DIAGNOSIS — Z940 Kidney transplant status: Secondary | ICD-10-CM | POA: Diagnosis not present

## 2021-12-21 DIAGNOSIS — I878 Other specified disorders of veins: Secondary | ICD-10-CM | POA: Diagnosis not present

## 2021-12-21 DIAGNOSIS — Z792 Long term (current) use of antibiotics: Secondary | ICD-10-CM | POA: Diagnosis not present

## 2021-12-21 DIAGNOSIS — H40033 Anatomical narrow angle, bilateral: Secondary | ICD-10-CM | POA: Diagnosis not present

## 2021-12-21 DIAGNOSIS — R0602 Shortness of breath: Secondary | ICD-10-CM | POA: Diagnosis not present

## 2021-12-21 DIAGNOSIS — Z79899 Other long term (current) drug therapy: Secondary | ICD-10-CM | POA: Diagnosis not present

## 2021-12-21 DIAGNOSIS — H25813 Combined forms of age-related cataract, bilateral: Secondary | ICD-10-CM | POA: Diagnosis not present

## 2021-12-21 DIAGNOSIS — H40013 Open angle with borderline findings, low risk, bilateral: Secondary | ICD-10-CM | POA: Diagnosis not present

## 2021-12-21 DIAGNOSIS — E785 Hyperlipidemia, unspecified: Secondary | ICD-10-CM | POA: Diagnosis not present

## 2021-12-21 LAB — POCT GLYCOSYLATED HEMOGLOBIN (HGB A1C): Hemoglobin A1C: 7.4 % — AB (ref 4.0–5.6)

## 2021-12-21 MED ORDER — TRESIBA FLEXTOUCH 200 UNIT/ML ~~LOC~~ SOPN: PEN_INJECTOR | SUBCUTANEOUS | 1 refills | Status: DC

## 2021-12-21 NOTE — Progress Notes (Signed)
Patient ID: Brandon Carroll, male   DOB: 24-Jul-1960, 61 y.o.   MRN: [id number]           Reason for Appointment: Type II Diabetes follow-up   History of Present Illness   Diagnosis date: 1996  Previous history:  Non-insulin hypoglycemic drugs previously used: Metformin, Ozempic Insulin was started in 2014  A1c range in the last few years is: 6.9-13 point  Recent history:     Non-insulin hypoglycemic drugs: None     Insulin regimen:   70/30, 70 units daily, Humalog 5 units as needed        Side effects from medications: Abdominal cramps, Ozempic  Current self management, blood sugar patterns and problems identified:  A1c is unchanged at 7.4  He was given Lantus and Humalog by his transplant team and nephrologist after his transplant last year he is back on 70/30 insulin He says that because of going back to work in the last 3 weeks he is not able to take this in the morning since his breakfast time is inconsistent when he gets to work and is afraid of low sugars He will then take his insulin when he comes back from work in the afternoon at 3 PM He is generally eating 3 meals a day including oatmeal at breakfast Although he thinks he is on 5 mg prednisone this is not on his medication list His freestyle libre was not downloaded sensor data was not available to print from the app on his phone and this was reviewed directly He is somewhat active at work but not doing much exercise otherwise His weight is appearing to be getting lower gradually   Exercise: Some walking at work Diet management: Usually trying to get balanced meals and low-fat, not eating out a lot, does not count carbohydrates     Hypoglycemia: Periodically either overnight or late afternoon   [place]    Dates of Recording: Last 2 weeks  Sensor description: [place] 2  Results statistics:   CGM use % of time   Average and SD 159  Time in range 57     %  %  Time Above 180 34  % Time above 250   % Time Below target 4    PRE-MEAL Fasting Lunch Dinner Bedtime Overall  Glucose range:       Mean/median:  215  120    POST-MEAL PC Breakfast PC Lunch PC Dinner  Glucose range:     Mean/median:   143    Glycemic patterns summary: Blood sugars are generally in the low 100 range in the mornings but usually rising progressively to peak around midday and then coming down gradually especially after mid afternoon with variable readings in the evenings but usually not as high  Hyperglycemic episodes are occurring mostly in the morning and midday hours into early afternoon  Hypoglycemic episodes occurred sporadically between 12-6 AM, 3 PM and 9 PM  Overnight periods: Blood sugars are mostly in the low to mid 100s occasionally lower and relatively stable  Preprandial periods: Blood sugars are near normal at breakfast, significantly high at lunch and variably high at dinnertime  Postprandial periods:   After breakfast:   Blood sugars are usually rising progressively and peaking by midday After lunch: Blood sugars are somewhat higher but not much more than Premeal readings After dinner: Blood sugars are high Premeal are usually declining   Weight control:  Wt Readings from Last 3 Encounters:  12/21/21 193 lb  9.6 oz (87.8 kg)  09/08/21 203 lb 3.2 oz (92.2 kg)  07/31/21 208 lb (94.3 kg)            Diabetes labs:  Lab Results  Component Value Date   HGBA1C 7.4 (A) 12/21/2021   HGBA1C 7.4 (A) 09/08/2021   HGBA1C 9.2 (A) 06/29/2021   Lab Results  Component Value Date   MICROALBUR 86.1 (H) 08/04/2014   LDLCALC 212 (H) 03/21/2017   CREATININE 6.52 (H) 04/23/2019  Recent creatinine 1.6   Lab Results  Component Value Date   FRUCTOSAMINE 289 (H) 10/16/2018   FRUCTOSAMINE 225 11/28/2017     Allergies as of 12/21/2021       Reactions   Lipitor [atorvastatin] Hives, Itching        Medication List        Accurate as of December 21, 2021  1:45 PM. If you have any questions, ask your nurse or doctor.          albuterol (2.5 MG/3ML) 0.083% nebulizer solution Commonly known as: PROVENTIL Take 3 mLs (2.5 mg total) by nebulization every 6 (six) hours as needed for wheezing or shortness of breath.   aspirin EC 81 MG tablet Take 1 tablet by mouth daily.   BD Pen Needle Nano 2nd Gen 32G X 4 MM Misc Generic drug: Insulin Pen Needle USE DAILY WITH INSULIN What changed: See the new instructions.   carvedilol 6.25 MG tablet Commonly known as: COREG Take by mouth.   FreeStyle Libre 14 Day Sensor Misc 1 Device by Other route every 14 (fourteen) days.   FreeStyle [place] 2 Reader Kerrin Mo See admin instructions.   glucose blood test strip Commonly known as: OneTouch Verio 1 each by Other route 2 (two) times daily as needed for other. And lancets 2/day What changed: reasons to take this   hydrALAZINE 25 MG tablet Commonly known as: APRESOLINE Take 1 tablet (25 mg total) by mouth 3 (three) times daily. TAKE 1 TABLET(25 MG) BY MOUTH THREE TIMES DAILY Strength: 25 mg   insulin isophane & regular human KwikPen (70-30) 100 UNIT/ML KwikPen Commonly known as: HUMULIN 70/30 MIX Inject 70 Units into the skin daily with breakfast. And pen needles 1/day   insulin lispro 100 UNIT/ML KwikPen Commonly known as: HumaLOG KwikPen Inject 5 Units into the skin daily with supper. And pen needles 2/day   isosorbide mononitrate 60 MG 24 hr tablet Commonly known as: IMDUR Take by mouth.   magnesium oxide 400 MG tablet Commonly known as: MAG-OX Take 2 tablets by mouth daily.   mycophenolate 180 MG EC tablet Commonly known as: MYFORTIC Take 180 mg by mouth 2 (two) times daily.   NIFEdipine 30 MG 24 hr tablet Commonly known as: PROCARDIA-XL/NIFEDICAL-XL Take 2 tablets by mouth daily.   pantoprazole 40 MG tablet Commonly known as: PROTONIX Take 1 tablet by mouth daily.   polyethylene glycol powder 17 GM/SCOOP powder Commonly  known as: GLYCOLAX/MIRALAX Take 17 g by mouth as needed for constipation.   tacrolimus 1 MG capsule Commonly known as: PROGRAF Take by mouth. 6 in the morning and 6 in the evening.   valGANciclovir 450 MG tablet Commonly known as: VALCYTE Take 900 mg by mouth 2 (two) times daily.        Allergies:  Allergies  Allergen Reactions   Lipitor [Atorvastatin] Hives and Itching    Past Medical History:  Diagnosis Date   A-V fistula ([place]) 03/12/2019   Abnormal CT of the chest 06/08/2015  Abnormal echocardiogram 56/43/3295   Acute systolic CHF (congestive heart failure), NYHA class 2 ([place]) 05/04/2015   Acute-on-chronic kidney injury ([place]) 05/04/2015   Anemia 06/06/2015   Anemia in chronic kidney disease 11/18/2018   Body mass index (BMI) 27.0-27.9, adult 05/21/8414   Chronic systolic CHF (congestive heart failure) ([place]) 06/15/2015   Coronary artery disease 02/17/2018   Cardiac catheterization 2017 showing 10% LAD and 10% RCA Echocardiogram and stress test done in December 2019 showed evidence of old small inferior lateral myocardial infarction   Diabetes ([place]) 06/20/2019   Diabetes mellitus    Diarrhea, unspecified 10/22/2018   Dyspnea    Encounter for general adult medical examination with abnormal findings 06/12/2019   Erectile dysfunction    ESRD (end stage renal disease) on dialysis [facility])    Essential hypertension 07/03/2016   Fluid overload, unspecified 01/26/2019   Hyperlipidemia    Hyperlipidemia associated with type 2 diabetes mellitus ([place]) 09/18/2011   Started statin therapy 09/2011.  Anticipate improvement as glucose control improves.    Hypertension    Iron deficiency anemia, unspecified 11/03/2018   Low testosterone 10/04/2015   12/01/2015 visit with Festus Aloe, MD at [facility].    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   Secondary hyperparathyroidism ([place]) 10/20/2018    Past Surgical History:  Procedure Laterality Date   AV FISTULA PLACEMENT Left 05/26/2018   Procedure: ARTERIOVENOUS (AV) FISTULA CREATION LEFT ARM;  Surgeon: Marty Heck, MD;  Location: [place];  Service: Vascular;  Laterality: Left;   CARDIAC CATHETERIZATION N/A 06/15/2015   Procedure: Left Heart Cath and Coronary Angiography;  Surgeon: Peter M Martinique, MD;  Location: [place] CV LAB;  Service: Cardiovascular;  Laterality: N/A;   CHOLECYSTECTOMY N/A 06/28/2017   Procedure: LAPAROSCOPIC CHOLECYSTECTOMY;  Surgeon: Ralene Ok, MD;  [facility];  Service: General;  Laterality: N/A;   HERNIA REPAIR  7th grade   umbilical   LEFT HEART CATH AND CORONARY ANGIOGRAPHY N/A 04/28/2019   Procedure: LEFT HEART CATH AND CORONARY ANGIOGRAPHY;  Surgeon: Leonie Man, MD;  Location: [place] CV LAB;  Service: Cardiovascular;  Laterality: N/A;   Left Kidney Transplant  07/12/2020    Family History  Problem Relation Age of Onset   Arthritis Mother    COPD Mother    Diabetes Mother        was thin, took insulin   Hypertension Mother    Hypertension Father    Arthritis Sister        rheumatoid   COPD Sister    Diabetes Brother    Colon cancer Neg Hx     Social History:  reports that he has never smoked. He has never been exposed to tobacco smoke. He has never used smokeless tobacco. He reports that he does not drink alcohol and does not use drugs.  Review of Systems:  Last diabetic eye exam date 12/21/2021  Last urine microalbumin date: 2018  Last foot exam date: 10/22  Symptoms of neuropathy: None  Hypertension: Managed by nephrologist, only on Coreg, nifedipine and hydralazine  BP Readings from Last 3 Encounters:  12/21/21 122/70  09/08/21 140/80  07/31/21 (!) 150/82    Lipid management: Previously on  Crestor, not clear why he was told not to start this again    Lab  Results  Component Value Date   CHOL 197 09/24/2017   CHOL 305 (H) 03/21/2017   CHOL 254 (H) 07/26/2015   Lab Results  Component Value Date   HDL 45.60 09/24/2017   HDL 60 03/21/2017   HDL 74 07/26/2015   Lab Results  Component Value Date   LDLCALC 212 (H) 03/21/2017   LDLCALC 158 (H) 07/26/2015   LDLCALC 119 (H) 05/03/2015   Lab Results  Component Value Date   TRIG 245.0 (H) 09/24/2017   TRIG 163 (H) 03/21/2017   TRIG 109 07/26/2015   Lab Results  Component Value Date   CHOLHDL 4 09/24/2017   CHOLHDL 5.1 (H) 03/21/2017   CHOLHDL 3.4 07/26/2015   Lab Results  Component Value Date   LDLDIRECT 97 04/26/2021   LDLDIRECT 106.0 09/24/2017     Examination:   BP 122/70   Pulse 69   Ht 5\' 9"  (1.753 m)   Wt 193 lb 9.6 oz (87.8 kg)   SpO2 97%   BMI 28.59 kg/m   Body mass index is 28.59 kg/m.    ASSESSMENT/ PLAN:    Diabetes type 2 on insulin:   Current regimen:  See history of present illness for detailed discussion of current diabetes management, blood sugar patterns and problems identified  A1c is 7.4 Recently using freestyle libre system with only 57 % blood sugars within target range Blood sugars are inadequately controlled with using 1 injection of 70/30 insulin a day Also his taking remains limited in the mid afternoon mostly is limiting his coverage for breakfast and lunch and periodically causing low sugars overnight also  Recommendations:  Start basal bolus insulin regimen with Brandon Carroll and Humalog instead of 70/30 Discussed normal physiology of insulin secretion and need for avoiding 1 large dose of premixed insulin which is not controlling his sugars optimally and making it difficult for him to use since he needs flexibility and insulin timing based on his schedule at work  Discussed how basal insulin works, timing of injection, dosage of 36 units to start with He was given a sample  of TRESIBA U-200 Also discussed titration based on fasting blood sugar every 3 days by 2 units and at target of 90-130 for fasting reading.   Given a flowsheet with instructions on how to keep a record and adjust the doses  Co-pay card and brochure on the insulin given Also discussed in detail the need for mealtime insulin to cover postprandial spikes, action of mealtime insulin, use of the insulin pen, timing and action of the rapid acting insulin as well as starting dose and dosage titration to target the two-hour reading of under 180 Given a chart on the mealtime insulin dosing pattern and indicated to take the insulin right before eating using 10 units at breakfast, 12 at lunch and 14 at dinner He will also adjust this further based on postprandial readings of 180 or less and increase the dose by 2 to 4 units if needed Also can adjust the dose up or down 2 to 4 units based on the total meal size and carbohydrates He will look into the possibility of using the libre 3 or Dexcom depending on his insurance coverage We need to try and make sure we can download his data from his phone on the next visit  LIPIDS: Although his last LDL was below 100 not clear if this is consistent, has had significantly high baseline cholesterol and likely needs to go back on Crestor for cardiovascular risk reduction,  this was discussed Will check LDL on the next visit and decide on treatment  There are no Patient Instructions on file for this visit.  Total visit time for evaluation and management, review of extensive records, counseling on diabetes and insulin management = 40 minutes  Naomi Fitton 12/21/2021, 1:45 PM

## 2021-12-21 NOTE — Patient Instructions (Signed)
Tyler Aas insulin: This insulin provides blood sugar control for up to 24 hours.   Start with 36  units at bedtime daily and increase by 2 units every 3 days until the waking up sugars are under 130. Then continue the same dose.  If blood sugar is under 90 for 2 days in a row, reduce the dose by 2 units.   Note that this insulin does not control the rise of blood sugar with meals

## 2021-12-28 ENCOUNTER — Ambulatory Visit (INDEPENDENT_AMBULATORY_CARE_PROVIDER_SITE_OTHER): Payer: Medicare Other | Admitting: Cardiology

## 2021-12-28 ENCOUNTER — Encounter: Payer: Self-pay | Admitting: Cardiology

## 2021-12-28 VITALS — BP 150/80 | HR 78 | Ht 69.0 in | Wt 188.0 lb

## 2021-12-28 DIAGNOSIS — Z794 Long term (current) use of insulin: Secondary | ICD-10-CM | POA: Diagnosis not present

## 2021-12-28 DIAGNOSIS — E1121 Type 2 diabetes mellitus with diabetic nephropathy: Secondary | ICD-10-CM

## 2021-12-28 DIAGNOSIS — E782 Mixed hyperlipidemia: Secondary | ICD-10-CM

## 2021-12-28 DIAGNOSIS — I10 Essential (primary) hypertension: Secondary | ICD-10-CM | POA: Diagnosis not present

## 2021-12-28 DIAGNOSIS — R079 Chest pain, unspecified: Secondary | ICD-10-CM

## 2021-12-28 DIAGNOSIS — I2510 Atherosclerotic heart disease of native coronary artery without angina pectoris: Secondary | ICD-10-CM

## 2021-12-28 MED ORDER — ISOSORBIDE MONONITRATE ER 120 MG PO TB24
120.0000 mg | ORAL_TABLET | Freq: Every day | ORAL | 3 refills | Status: AC
Start: 2021-12-28 — End: ?

## 2021-12-28 NOTE — Progress Notes (Signed)
Cardiology Office Note:    Date:  12/28/2021   ID:  Brandon Carroll, DOB [birth year], MRN [id number]  PCP:  Hoyt Koch, MD  Cardiologist:  Jenne Campus, MD    Referring MD: Hoyt Koch, *   Chief Complaint  Patient presents with   Follow-up  I am having shortness of breath and I was told to had a heart attack  History of Present Illness:    Brandon Carroll is a 61 y.o. male   with past medical history significant for coronary artery disease, chronic renal failure, status post kidney transplant a year ago from cada[facility], he did have a cardiac catheterization done last evaluation before kidney transplant in 2020, he was find to have moderate disease of ostial circumflex as well as proximal RCA no critical lesions was identified.  His past medical history also significant for type 2 diabetes. He is coming today to my office for follow-up he is not doing well.  He complained of having shortness of breath he tried to go back to work but he could not continue.  Her best because of fatigue tiredness and shortness of breath.  Described to have also some chest pain however interestingly the chest pain typically happens at rest.  He does not have any typical exertional chest pain.  Past Medical History:  Diagnosis Date   A-V fistula ([place]) 03/12/2019   Abnormal CT of the chest 06/08/2015   Abnormal echocardiogram 89/16/9450   Acute systolic CHF (congestive heart failure), NYHA class 2 ([place]) 05/04/2015   Acute-on-chronic kidney injury ([place]) 05/04/2015   Anemia 06/06/2015   Anemia in chronic kidney disease 11/18/2018   Body mass index (BMI) 27.0-27.9, adult 07/20/8826   Chronic systolic CHF (congestive heart failure) ([place]) 06/15/2015   Coronary artery disease 02/17/2018   Cardiac catheterization 2017 showing 10% LAD and 10% RCA Echocardiogram and stress test done in December 2019 showed evidence of old small inferior lateral myocardial infarction   Diabetes ([place]) 06/20/2019    Diabetes mellitus    Diarrhea, unspecified 10/22/2018   Dyspnea    Encounter for general adult medical examination with abnormal findings 06/12/2019   Erectile dysfunction    ESRD (end stage renal disease) on dialysis [facility])    Essential hypertension 07/03/2016   Fluid overload, unspecified 01/26/2019   Hyperlipidemia    Hyperlipidemia associated with type 2 diabetes mellitus ([place]) 09/18/2011   Started statin therapy 09/2011.  Anticipate improvement as glucose control improves.    Hypertension    Iron deficiency anemia, unspecified 11/03/2018   Low testosterone 10/04/2015   12/01/2015 visit with Festus Aloe, MD at [facility].    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   Secondary hyperparathyroidism ([place]) 10/20/2018    Past Surgical History:  Procedure Laterality Date   AV FISTULA PLACEMENT Left 05/26/2018   Procedure: ARTERIOVENOUS (AV) FISTULA CREATION LEFT ARM;  Surgeon: Marty Heck, MD;  Location: [place];  Service: Vascular;  Laterality: Left;   CARDIAC CATHETERIZATION N/A 06/15/2015   Procedure: Left Heart Cath and Coronary Angiography;  Surgeon: Peter M Martinique, MD;  Location: [place] CV LAB;  Service: Cardiovascular;  Laterality: N/A;   CHOLECYSTECTOMY N/A 06/28/2017   Procedure: LAPAROSCOPIC CHOLECYSTECTOMY;  Surgeon: Ralene Ok, MD;  [facility];  Service: General;  Laterality: N/A;  HERNIA REPAIR  7th grade   umbilical   LEFT HEART CATH AND CORONARY ANGIOGRAPHY N/A 04/28/2019   Procedure: LEFT HEART CATH AND CORONARY ANGIOGRAPHY;  Surgeon: Leonie Man, MD;  Location: [place] CV LAB;  Service: Cardiovascular;  Laterality: N/A;   Left Kidney Transplant  07/12/2020    Current Medications: Current  Meds  Medication Sig   albuterol (PROVENTIL) (2.5 MG/3ML) 0.083% nebulizer solution Take 3 mLs (2.5 mg total) by nebulization every 6 (six) hours as needed for wheezing or shortness of breath.   aspirin 81 MG EC tablet Take 1 tablet by mouth daily.   carvedilol (COREG) 6.25 MG tablet Take by mouth.   Continuous Blood Gluc Receiver (FREESTYLE LIBRE 2 READER) DEVI See admin instructions.   hydrALAZINE (APRESOLINE) 25 MG tablet Take 1 tablet (25 mg total) by mouth 3 (three) times daily. TAKE 1 TABLET(25 MG) BY MOUTH THREE TIMES DAILY Strength: 25 mg   insulin degludec (TRESIBA FLEXTOUCH) 200 UNIT/ML FlexTouch Pen Up to 40 units once a day   Insulin Lispro w/ Trans Port 100 UNIT/ML SOPN Inject 5 Units into the skin 3 (three) times daily.   isosorbide mononitrate (IMDUR) 60 MG 24 hr tablet Take by mouth.   latanoprost (XALATAN) 0.005 % ophthalmic solution Place 1 drop into both eyes at bedtime.   magnesium oxide (MAG-OX) 400 MG tablet Take 2 tablets by mouth daily.   mycophenolate (MYFORTIC) 180 MG EC tablet Take 2 tablets by mouth 2 (two) times daily.   NIFEdipine (PROCARDIA-XL/NIFEDICAL-XL) 30 MG 24 hr tablet Take 2 tablets by mouth daily.   pantoprazole (PROTONIX) 40 MG tablet Take 1 tablet by mouth daily.   polyethylene glycol powder (GLYCOLAX/MIRALAX) 17 GM/SCOOP powder Take 17 g by mouth as needed for constipation.   tacrolimus (PROGRAF) 1 MG capsule Take by mouth. 6 in the morning and 6 in the evening.   valGANciclovir (VALCYTE) 450 MG tablet Take 450 mg by mouth 2 (two) times daily.     Allergies:   Lipitor [atorvastatin]   Social History   Socioeconomic History   Marital status: Divorced    Spouse name: n/a   Number of children: 1   Years of education: 12+   Highest education level: Not on file  Occupational History   Occupation: Building services engineer: [employer]  Tobacco Use   Smoking status: Never    Passive exposure: Never   Smokeless tobacco: Never  Vaping Use    Vaping Use: Never used  Substance and Sexual Activity   Alcohol use: No    Alcohol/week: 0.0 standard drinks of alcohol   Drug use: No   Sexual activity: Not Currently    Comment: Erectile dysfunciotn  Other Topics Concern   Not on file  Social History Narrative   Divorced. Education: [employer].    Unable to exercise due to significant SOB and DOE.    Lives alone.   Son lives in [place].   Social Determinants of Health   Financial Resource Strain: Not on file  Food Insecurity: Not on file  Transportation Needs: Not on file  Physical Activity: Not on file  Stress: Not on file  Social Connections: Not on file     Family History: The patient's family history includes Arthritis in his mother and sister; COPD in his mother and sister; Diabetes in his brother and mother; Hypertension in his father and mother. There is no history of Colon cancer. ROS:   Please see the history of present  illness.    All 14 point review of systems negative except as described per history of present illness  EKGs/Labs/Other Studies Reviewed:      Recent Labs: No results found for requested labs within last 365 days.  Recent Lipid Panel    Component Value Date/Time   CHOL 197 09/24/2017 0823   CHOL 305 (H) 03/21/2017 1124   TRIG 245.0 (H) 09/24/2017 0823   HDL 45.60 09/24/2017 0823   HDL 60 03/21/2017 1124   CHOLHDL 4 09/24/2017 0823   VLDL 49.0 (H) 09/24/2017 0823   LDLCALC 212 (H) 03/21/2017 1124   LDLDIRECT 97 04/26/2021 1025   LDLDIRECT 106.0 09/24/2017 0823    Physical Exam:    VS:  BP (!) 150/80 (BP Location: Right Arm, Patient Position: Sitting, Cuff Size: Normal)   Pulse 78   Ht 5\' 9"  (1.753 m)   Wt 188 lb (85.3 kg)   SpO2 98%   BMI 27.76 kg/m     Wt Readings from Last 3 Encounters:  12/28/21 188 lb (85.3 kg)  12/21/21 193 lb 9.6 oz (87.8 kg)  09/08/21 203 lb 3.2 oz (92.2 kg)     GEN:  Well nourished, well developed in no acute distress HEENT: Normal NECK: No JVD; No  carotid bruits LYMPHATICS: No lymphadenopathy CARDIAC: RRR, no murmurs, no rubs, no gallops RESPIRATORY:  Clear to auscultation without rales, wheezing or rhonchi  ABDOMEN: Soft, non-tender, non-distended MUSCULOSKELETAL:  No edema; No deformity  SKIN: Warm and dry LOWER EXTREMITIES: no swelling NEUROLOGIC:  Alert and oriented x 3 PSYCHIATRIC:  Normal affect   ASSESSMENT:    1. Coronary artery disease involving native coronary artery of native heart without angina pectoris   2. Essential hypertension   3. Type 2 diabetes mellitus with diabetic nephropathy, with long-term current use of insulin ([place])   4. Mixed hyperlipidemia    PLAN:    In order of problems listed above:  Coronary artery disease known based on cardiac catheterization moderate from 2020.  He deserves to have a evaluation for worsening of the problem.  He is already scheduled to have a stress test at [facility].  He also need to have an echocardiogram and again he tells me that that is being already scheduled at [facility].  In 2 weeks he will have stress test as well as echocardiogram done.  In the meantime he is on antiplatelet therapy also on antianginal therapy.  I will increase Imdur from 60-120 daily.  Again I am still puzzled why he is not on statin.  In my opinion he need to be on at least pravastatin. We will hypertension blood pressure slightly uncontrolled on the higher side.  I will increase dose of Imdur that should help some Type 2 diabetes that being follow-up by internal medicine team, I see last hemoglobin A1c from 12/21/2021 which is 7.4.  Need better control of it. Dyslipidemia please look a discussion above.  Previously we had a discussion about starting cholesterol medication.  He need to be on pravastatin   Medication Adjustments/Labs and Tests Ordered: Current medicines are reviewed at length with the patient today.  Concerns regarding medicines are outlined above.  No orders of the defined types were  placed in this encounter.  Medication changes: No orders of the defined types were placed in this encounter.   Signed, Park Liter, MD, [facility] 12/28/2021 9:26 AM    [place]

## 2021-12-28 NOTE — Patient Instructions (Signed)
Medication Instructions:  Your physician has recommended you make the following change in your medication:   INCREASE: Imdur to 120mg  daily. You may double up on your current dose and then your next refill will be 120mg  1 tablet daily    Lab Work: Troponin - today 2nd Floor [address]   Testing/Procedures: None Ordered   Follow-Up: At [facility], you and your health needs are our priority.  As part of our continuing mission to provide you with exceptional heart care, we have created designated Provider Care Teams.  These Care Teams include your primary Cardiologist (physician) and Advanced Practice Providers (APPs -  Physician Assistants and Nurse Practitioners) who all work together to provide you with the care you need, when you need it.  We recommend signing up for the patient portal called "MyChart".  Sign up information is provided on this After Visit Summary.  MyChart is used to connect with patients for Virtual Visits (Telemedicine).  Patients are able to view lab/test results, encounter notes, upcoming appointments, etc.  Non-urgent messages can be sent to your provider as well.   To learn more about what you can do with MyChart, go to [email or web address].    Your next appointment:   3 month(s)  The format for your next appointment:   In Person  Provider:   Jenne Campus, MD    Other Instructions NA

## 2021-12-29 ENCOUNTER — Telehealth: Payer: Self-pay

## 2021-12-29 LAB — TROPONIN T: Troponin T (Highly Sensitive): 32 ng/L (ref 0–22)

## 2021-12-29 NOTE — Telephone Encounter (Signed)
Spoke with pt about the increased troponin level and the need to go to the ER per Dr. Wendy Poet recommendations. Pt verbalized understanding, stated that he would go and took the name of the test and the result.

## 2022-01-01 DIAGNOSIS — H40033 Anatomical narrow angle, bilateral: Secondary | ICD-10-CM | POA: Diagnosis not present

## 2022-01-04 DIAGNOSIS — Z4822 Encounter for aftercare following kidney transplant: Secondary | ICD-10-CM | POA: Diagnosis not present

## 2022-01-08 DIAGNOSIS — H40013 Open angle with borderline findings, low risk, bilateral: Secondary | ICD-10-CM | POA: Diagnosis not present

## 2022-01-08 DIAGNOSIS — H40033 Anatomical narrow angle, bilateral: Secondary | ICD-10-CM | POA: Diagnosis not present

## 2022-01-08 DIAGNOSIS — H25813 Combined forms of age-related cataract, bilateral: Secondary | ICD-10-CM | POA: Diagnosis not present

## 2022-01-08 DIAGNOSIS — E113293 Type 2 diabetes mellitus with mild nonproliferative diabetic retinopathy without macular edema, bilateral: Secondary | ICD-10-CM | POA: Diagnosis not present

## 2022-01-08 LAB — HM DIABETES EYE EXAM

## 2022-01-11 DIAGNOSIS — I5022 Chronic systolic (congestive) heart failure: Secondary | ICD-10-CM | POA: Diagnosis not present

## 2022-01-11 DIAGNOSIS — I10 Essential (primary) hypertension: Secondary | ICD-10-CM | POA: Diagnosis not present

## 2022-01-11 DIAGNOSIS — D849 Immunodeficiency, unspecified: Secondary | ICD-10-CM | POA: Diagnosis not present

## 2022-01-11 DIAGNOSIS — Z940 Kidney transplant status: Secondary | ICD-10-CM | POA: Diagnosis not present

## 2022-01-11 DIAGNOSIS — I519 Heart disease, unspecified: Secondary | ICD-10-CM | POA: Diagnosis not present

## 2022-01-11 DIAGNOSIS — R0602 Shortness of breath: Secondary | ICD-10-CM | POA: Diagnosis not present

## 2022-01-18 DIAGNOSIS — Z940 Kidney transplant status: Secondary | ICD-10-CM | POA: Diagnosis not present

## 2022-01-18 DIAGNOSIS — B3781 Candidal esophagitis: Secondary | ICD-10-CM | POA: Diagnosis not present

## 2022-01-18 DIAGNOSIS — D649 Anemia, unspecified: Secondary | ICD-10-CM | POA: Diagnosis not present

## 2022-01-18 DIAGNOSIS — Z792 Long term (current) use of antibiotics: Secondary | ICD-10-CM | POA: Diagnosis not present

## 2022-01-18 DIAGNOSIS — R079 Chest pain, unspecified: Secondary | ICD-10-CM | POA: Diagnosis not present

## 2022-01-18 DIAGNOSIS — I151 Hypertension secondary to other renal disorders: Secondary | ICD-10-CM | POA: Diagnosis not present

## 2022-01-18 DIAGNOSIS — I871 Compression of vein: Secondary | ICD-10-CM | POA: Diagnosis not present

## 2022-01-18 DIAGNOSIS — Z79899 Other long term (current) drug therapy: Secondary | ICD-10-CM | POA: Diagnosis not present

## 2022-01-18 DIAGNOSIS — I10 Essential (primary) hypertension: Secondary | ICD-10-CM | POA: Diagnosis not present

## 2022-01-18 DIAGNOSIS — Z7952 Long term (current) use of systemic steroids: Secondary | ICD-10-CM | POA: Diagnosis not present

## 2022-01-18 DIAGNOSIS — Z79621 Long term (current) use of calcineurin inhibitor: Secondary | ICD-10-CM | POA: Diagnosis not present

## 2022-01-18 DIAGNOSIS — E119 Type 2 diabetes mellitus without complications: Secondary | ICD-10-CM | POA: Diagnosis not present

## 2022-01-18 DIAGNOSIS — I5022 Chronic systolic (congestive) heart failure: Secondary | ICD-10-CM | POA: Diagnosis not present

## 2022-01-18 DIAGNOSIS — E785 Hyperlipidemia, unspecified: Secondary | ICD-10-CM | POA: Diagnosis not present

## 2022-01-18 DIAGNOSIS — R0602 Shortness of breath: Secondary | ICD-10-CM | POA: Diagnosis not present

## 2022-01-18 DIAGNOSIS — R5383 Other fatigue: Secondary | ICD-10-CM | POA: Diagnosis not present

## 2022-01-18 DIAGNOSIS — B370 Candidal stomatitis: Secondary | ICD-10-CM | POA: Insufficient documentation

## 2022-01-18 DIAGNOSIS — B259 Cytomegaloviral disease, unspecified: Secondary | ICD-10-CM | POA: Diagnosis not present

## 2022-01-18 DIAGNOSIS — R053 Chronic cough: Secondary | ICD-10-CM | POA: Diagnosis not present

## 2022-01-18 DIAGNOSIS — Z4822 Encounter for aftercare following kidney transplant: Secondary | ICD-10-CM | POA: Diagnosis not present

## 2022-01-18 DIAGNOSIS — E1121 Type 2 diabetes mellitus with diabetic nephropathy: Secondary | ICD-10-CM | POA: Diagnosis not present

## 2022-01-18 DIAGNOSIS — R0600 Dyspnea, unspecified: Secondary | ICD-10-CM | POA: Diagnosis not present

## 2022-01-18 DIAGNOSIS — N2889 Other specified disorders of kidney and ureter: Secondary | ICD-10-CM | POA: Diagnosis not present

## 2022-01-18 DIAGNOSIS — R600 Localized edema: Secondary | ICD-10-CM | POA: Diagnosis not present

## 2022-01-18 DIAGNOSIS — D869 Sarcoidosis, unspecified: Secondary | ICD-10-CM | POA: Diagnosis not present

## 2022-01-18 DIAGNOSIS — Z5181 Encounter for therapeutic drug level monitoring: Secondary | ICD-10-CM | POA: Diagnosis not present

## 2022-01-18 DIAGNOSIS — Z794 Long term (current) use of insulin: Secondary | ICD-10-CM | POA: Diagnosis not present

## 2022-01-18 DIAGNOSIS — Z79624 Long term (current) use of inhibitors of nucleotide synthesis: Secondary | ICD-10-CM | POA: Diagnosis not present

## 2022-01-22 ENCOUNTER — Encounter: Payer: Self-pay | Admitting: Endocrinology

## 2022-01-26 DIAGNOSIS — Z940 Kidney transplant status: Secondary | ICD-10-CM | POA: Diagnosis not present

## 2022-01-26 DIAGNOSIS — Z4822 Encounter for aftercare following kidney transplant: Secondary | ICD-10-CM | POA: Diagnosis not present

## 2022-01-27 DIAGNOSIS — R911 Solitary pulmonary nodule: Secondary | ICD-10-CM | POA: Diagnosis not present

## 2022-01-27 DIAGNOSIS — I898 Other specified noninfective disorders of lymphatic vessels and lymph nodes: Secondary | ICD-10-CM | POA: Diagnosis not present

## 2022-01-27 DIAGNOSIS — R918 Other nonspecific abnormal finding of lung field: Secondary | ICD-10-CM | POA: Diagnosis not present

## 2022-01-27 DIAGNOSIS — R0602 Shortness of breath: Secondary | ICD-10-CM | POA: Diagnosis not present

## 2022-01-27 DIAGNOSIS — I2510 Atherosclerotic heart disease of native coronary artery without angina pectoris: Secondary | ICD-10-CM | POA: Diagnosis not present

## 2022-01-27 DIAGNOSIS — I700 Atherosclerosis of aorta: Secondary | ICD-10-CM | POA: Diagnosis not present

## 2022-01-31 ENCOUNTER — Encounter: Payer: Self-pay | Admitting: *Deleted

## 2022-02-01 DIAGNOSIS — Z79899 Other long term (current) drug therapy: Secondary | ICD-10-CM | POA: Diagnosis not present

## 2022-02-01 DIAGNOSIS — Z792 Long term (current) use of antibiotics: Secondary | ICD-10-CM | POA: Diagnosis not present

## 2022-02-01 DIAGNOSIS — Z7952 Long term (current) use of systemic steroids: Secondary | ICD-10-CM | POA: Diagnosis not present

## 2022-02-01 DIAGNOSIS — E1122 Type 2 diabetes mellitus with diabetic chronic kidney disease: Secondary | ICD-10-CM | POA: Diagnosis not present

## 2022-02-01 DIAGNOSIS — Z8709 Personal history of other diseases of the respiratory system: Secondary | ICD-10-CM | POA: Diagnosis not present

## 2022-02-01 DIAGNOSIS — Z79621 Long term (current) use of calcineurin inhibitor: Secondary | ICD-10-CM | POA: Diagnosis not present

## 2022-02-01 DIAGNOSIS — J90 Pleural effusion, not elsewhere classified: Secondary | ICD-10-CM | POA: Diagnosis not present

## 2022-02-01 DIAGNOSIS — D649 Anemia, unspecified: Secondary | ICD-10-CM | POA: Diagnosis not present

## 2022-02-01 DIAGNOSIS — I110 Hypertensive heart disease with heart failure: Secondary | ICD-10-CM | POA: Diagnosis not present

## 2022-02-01 DIAGNOSIS — B370 Candidal stomatitis: Secondary | ICD-10-CM | POA: Diagnosis not present

## 2022-02-01 DIAGNOSIS — J9601 Acute respiratory failure with hypoxia: Secondary | ICD-10-CM | POA: Diagnosis not present

## 2022-02-01 DIAGNOSIS — J810 Acute pulmonary edema: Secondary | ICD-10-CM | POA: Diagnosis not present

## 2022-02-01 DIAGNOSIS — E119 Type 2 diabetes mellitus without complications: Secondary | ICD-10-CM | POA: Diagnosis not present

## 2022-02-01 DIAGNOSIS — Z7982 Long term (current) use of aspirin: Secondary | ICD-10-CM | POA: Diagnosis not present

## 2022-02-01 DIAGNOSIS — I5022 Chronic systolic (congestive) heart failure: Secondary | ICD-10-CM | POA: Diagnosis not present

## 2022-02-01 DIAGNOSIS — D869 Sarcoidosis, unspecified: Secondary | ICD-10-CM | POA: Diagnosis not present

## 2022-02-01 DIAGNOSIS — Z87898 Personal history of other specified conditions: Secondary | ICD-10-CM | POA: Diagnosis not present

## 2022-02-01 DIAGNOSIS — Z4822 Encounter for aftercare following kidney transplant: Secondary | ICD-10-CM | POA: Diagnosis not present

## 2022-02-01 DIAGNOSIS — N179 Acute kidney failure, unspecified: Secondary | ICD-10-CM | POA: Diagnosis not present

## 2022-02-01 DIAGNOSIS — I10 Essential (primary) hypertension: Secondary | ICD-10-CM | POA: Diagnosis not present

## 2022-02-01 DIAGNOSIS — Z940 Kidney transplant status: Secondary | ICD-10-CM | POA: Diagnosis not present

## 2022-02-01 DIAGNOSIS — I130 Hypertensive heart and chronic kidney disease with heart failure and stage 1 through stage 4 chronic kidney disease, or unspecified chronic kidney disease: Secondary | ICD-10-CM | POA: Diagnosis not present

## 2022-02-01 DIAGNOSIS — R509 Fever, unspecified: Secondary | ICD-10-CM | POA: Diagnosis not present

## 2022-02-01 DIAGNOSIS — Z20822 Contact with and (suspected) exposure to covid-19: Secondary | ICD-10-CM | POA: Diagnosis not present

## 2022-02-01 DIAGNOSIS — Z5181 Encounter for therapeutic drug level monitoring: Secondary | ICD-10-CM | POA: Diagnosis not present

## 2022-02-01 DIAGNOSIS — I5030 Unspecified diastolic (congestive) heart failure: Secondary | ICD-10-CM | POA: Diagnosis not present

## 2022-02-01 DIAGNOSIS — R0902 Hypoxemia: Secondary | ICD-10-CM | POA: Diagnosis not present

## 2022-02-01 DIAGNOSIS — D8989 Other specified disorders involving the immune mechanism, not elsewhere classified: Secondary | ICD-10-CM | POA: Diagnosis not present

## 2022-02-01 DIAGNOSIS — B3781 Candidal esophagitis: Secondary | ICD-10-CM | POA: Diagnosis not present

## 2022-02-01 DIAGNOSIS — R918 Other nonspecific abnormal finding of lung field: Secondary | ICD-10-CM | POA: Diagnosis not present

## 2022-02-01 DIAGNOSIS — N189 Chronic kidney disease, unspecified: Secondary | ICD-10-CM | POA: Diagnosis not present

## 2022-02-01 DIAGNOSIS — R0602 Shortness of breath: Secondary | ICD-10-CM | POA: Diagnosis not present

## 2022-02-01 DIAGNOSIS — I4892 Unspecified atrial flutter: Secondary | ICD-10-CM | POA: Diagnosis not present

## 2022-02-01 DIAGNOSIS — B258 Other cytomegaloviral diseases: Secondary | ICD-10-CM | POA: Diagnosis not present

## 2022-02-01 DIAGNOSIS — E785 Hyperlipidemia, unspecified: Secondary | ICD-10-CM | POA: Diagnosis not present

## 2022-02-01 DIAGNOSIS — Z794 Long term (current) use of insulin: Secondary | ICD-10-CM | POA: Diagnosis not present

## 2022-02-01 DIAGNOSIS — N2581 Secondary hyperparathyroidism of renal origin: Secondary | ICD-10-CM | POA: Diagnosis not present

## 2022-02-01 DIAGNOSIS — D849 Immunodeficiency, unspecified: Secondary | ICD-10-CM | POA: Diagnosis not present

## 2022-02-01 DIAGNOSIS — I4891 Unspecified atrial fibrillation: Secondary | ICD-10-CM | POA: Diagnosis not present

## 2022-02-01 DIAGNOSIS — B250 Cytomegaloviral pneumonitis: Secondary | ICD-10-CM | POA: Diagnosis not present

## 2022-02-01 DIAGNOSIS — D860 Sarcoidosis of lung: Secondary | ICD-10-CM | POA: Diagnosis not present

## 2022-02-02 ENCOUNTER — Ambulatory Visit: Payer: Medicare Other | Admitting: Podiatry

## 2022-02-05 DIAGNOSIS — J159 Unspecified bacterial pneumonia: Secondary | ICD-10-CM | POA: Insufficient documentation

## 2022-02-08 DIAGNOSIS — Z792 Long term (current) use of antibiotics: Secondary | ICD-10-CM | POA: Diagnosis not present

## 2022-02-08 DIAGNOSIS — R5383 Other fatigue: Secondary | ICD-10-CM | POA: Diagnosis not present

## 2022-02-08 DIAGNOSIS — I10 Essential (primary) hypertension: Secondary | ICD-10-CM | POA: Diagnosis not present

## 2022-02-08 DIAGNOSIS — E119 Type 2 diabetes mellitus without complications: Secondary | ICD-10-CM | POA: Diagnosis not present

## 2022-02-08 DIAGNOSIS — Z4822 Encounter for aftercare following kidney transplant: Secondary | ICD-10-CM | POA: Diagnosis not present

## 2022-02-08 DIAGNOSIS — Z7952 Long term (current) use of systemic steroids: Secondary | ICD-10-CM | POA: Diagnosis not present

## 2022-02-08 DIAGNOSIS — D8989 Other specified disorders involving the immune mechanism, not elsewhere classified: Secondary | ICD-10-CM | POA: Diagnosis not present

## 2022-02-08 DIAGNOSIS — R0609 Other forms of dyspnea: Secondary | ICD-10-CM | POA: Diagnosis not present

## 2022-02-08 DIAGNOSIS — B3781 Candidal esophagitis: Secondary | ICD-10-CM | POA: Diagnosis not present

## 2022-02-08 DIAGNOSIS — B259 Cytomegaloviral disease, unspecified: Secondary | ICD-10-CM | POA: Diagnosis not present

## 2022-02-08 DIAGNOSIS — Z79620 Long term (current) use of immunosuppressive biologic: Secondary | ICD-10-CM | POA: Diagnosis not present

## 2022-02-08 DIAGNOSIS — E785 Hyperlipidemia, unspecified: Secondary | ICD-10-CM | POA: Diagnosis not present

## 2022-02-08 DIAGNOSIS — Z940 Kidney transplant status: Secondary | ICD-10-CM | POA: Diagnosis not present

## 2022-02-08 DIAGNOSIS — Z794 Long term (current) use of insulin: Secondary | ICD-10-CM | POA: Diagnosis not present

## 2022-02-20 DIAGNOSIS — E119 Type 2 diabetes mellitus without complications: Secondary | ICD-10-CM | POA: Diagnosis not present

## 2022-02-20 DIAGNOSIS — R590 Localized enlarged lymph nodes: Secondary | ICD-10-CM | POA: Diagnosis not present

## 2022-02-20 DIAGNOSIS — Z79621 Long term (current) use of calcineurin inhibitor: Secondary | ICD-10-CM | POA: Diagnosis not present

## 2022-02-20 DIAGNOSIS — E1121 Type 2 diabetes mellitus with diabetic nephropathy: Secondary | ICD-10-CM | POA: Diagnosis not present

## 2022-02-20 DIAGNOSIS — R0600 Dyspnea, unspecified: Secondary | ICD-10-CM | POA: Diagnosis not present

## 2022-02-20 DIAGNOSIS — D860 Sarcoidosis of lung: Secondary | ICD-10-CM | POA: Diagnosis not present

## 2022-02-20 DIAGNOSIS — E861 Hypovolemia: Secondary | ICD-10-CM | POA: Diagnosis not present

## 2022-02-20 DIAGNOSIS — Z4822 Encounter for aftercare following kidney transplant: Secondary | ICD-10-CM | POA: Diagnosis not present

## 2022-02-20 DIAGNOSIS — D84821 Immunodeficiency due to drugs: Secondary | ICD-10-CM | POA: Diagnosis not present

## 2022-02-20 DIAGNOSIS — J449 Chronic obstructive pulmonary disease, unspecified: Secondary | ICD-10-CM | POA: Diagnosis not present

## 2022-02-20 DIAGNOSIS — Z5181 Encounter for therapeutic drug level monitoring: Secondary | ICD-10-CM | POA: Diagnosis not present

## 2022-02-20 DIAGNOSIS — D8481 Immunodeficiency due to conditions classified elsewhere: Secondary | ICD-10-CM | POA: Diagnosis not present

## 2022-02-20 DIAGNOSIS — D849 Immunodeficiency, unspecified: Secondary | ICD-10-CM | POA: Diagnosis not present

## 2022-02-20 DIAGNOSIS — E8770 Fluid overload, unspecified: Secondary | ICD-10-CM | POA: Diagnosis not present

## 2022-02-20 DIAGNOSIS — D8989 Other specified disorders involving the immune mechanism, not elsewhere classified: Secondary | ICD-10-CM | POA: Diagnosis not present

## 2022-02-20 DIAGNOSIS — E1165 Type 2 diabetes mellitus with hyperglycemia: Secondary | ICD-10-CM | POA: Diagnosis not present

## 2022-02-20 DIAGNOSIS — I871 Compression of vein: Secondary | ICD-10-CM | POA: Diagnosis not present

## 2022-02-20 DIAGNOSIS — Z7982 Long term (current) use of aspirin: Secondary | ICD-10-CM | POA: Diagnosis not present

## 2022-02-20 DIAGNOSIS — I5020 Unspecified systolic (congestive) heart failure: Secondary | ICD-10-CM | POA: Diagnosis not present

## 2022-02-20 DIAGNOSIS — I5043 Acute on chronic combined systolic (congestive) and diastolic (congestive) heart failure: Secondary | ICD-10-CM | POA: Diagnosis not present

## 2022-02-20 DIAGNOSIS — B258 Other cytomegaloviral diseases: Secondary | ICD-10-CM | POA: Diagnosis not present

## 2022-02-20 DIAGNOSIS — D649 Anemia, unspecified: Secondary | ICD-10-CM | POA: Diagnosis not present

## 2022-02-20 DIAGNOSIS — Z20822 Contact with and (suspected) exposure to covid-19: Secondary | ICD-10-CM | POA: Diagnosis not present

## 2022-02-20 DIAGNOSIS — Z888 Allergy status to other drugs, medicaments and biological substances status: Secondary | ICD-10-CM | POA: Diagnosis not present

## 2022-02-20 DIAGNOSIS — B370 Candidal stomatitis: Secondary | ICD-10-CM | POA: Diagnosis not present

## 2022-02-20 DIAGNOSIS — Z79899 Other long term (current) drug therapy: Secondary | ICD-10-CM | POA: Diagnosis not present

## 2022-02-20 DIAGNOSIS — Z8249 Family history of ischemic heart disease and other diseases of the circulatory system: Secondary | ICD-10-CM | POA: Diagnosis not present

## 2022-02-20 DIAGNOSIS — Z9049 Acquired absence of other specified parts of digestive tract: Secondary | ICD-10-CM | POA: Diagnosis not present

## 2022-02-20 DIAGNOSIS — B3781 Candidal esophagitis: Secondary | ICD-10-CM | POA: Diagnosis not present

## 2022-02-20 DIAGNOSIS — I10 Essential (primary) hypertension: Secondary | ICD-10-CM | POA: Diagnosis not present

## 2022-02-20 DIAGNOSIS — Z7952 Long term (current) use of systemic steroids: Secondary | ICD-10-CM | POA: Diagnosis not present

## 2022-02-20 DIAGNOSIS — Z940 Kidney transplant status: Secondary | ICD-10-CM | POA: Diagnosis not present

## 2022-02-20 DIAGNOSIS — I5033 Acute on chronic diastolic (congestive) heart failure: Secondary | ICD-10-CM | POA: Diagnosis not present

## 2022-02-20 DIAGNOSIS — Z794 Long term (current) use of insulin: Secondary | ICD-10-CM | POA: Diagnosis not present

## 2022-02-20 DIAGNOSIS — Z9225 Personal history of immunosupression therapy: Secondary | ICD-10-CM | POA: Diagnosis not present

## 2022-02-20 DIAGNOSIS — I5022 Chronic systolic (congestive) heart failure: Secondary | ICD-10-CM | POA: Diagnosis not present

## 2022-02-20 DIAGNOSIS — N179 Acute kidney failure, unspecified: Secondary | ICD-10-CM | POA: Diagnosis not present

## 2022-02-20 DIAGNOSIS — R918 Other nonspecific abnormal finding of lung field: Secondary | ICD-10-CM | POA: Diagnosis not present

## 2022-02-20 DIAGNOSIS — B259 Cytomegaloviral disease, unspecified: Secondary | ICD-10-CM | POA: Diagnosis not present

## 2022-02-20 DIAGNOSIS — R0602 Shortness of breath: Secondary | ICD-10-CM | POA: Diagnosis not present

## 2022-02-20 DIAGNOSIS — E785 Hyperlipidemia, unspecified: Secondary | ICD-10-CM | POA: Diagnosis not present

## 2022-02-20 DIAGNOSIS — T8619 Other complication of kidney transplant: Secondary | ICD-10-CM | POA: Diagnosis not present

## 2022-02-20 DIAGNOSIS — I5031 Acute diastolic (congestive) heart failure: Secondary | ICD-10-CM | POA: Diagnosis not present

## 2022-02-20 DIAGNOSIS — J811 Chronic pulmonary edema: Secondary | ICD-10-CM | POA: Diagnosis not present

## 2022-02-20 DIAGNOSIS — I5030 Unspecified diastolic (congestive) heart failure: Secondary | ICD-10-CM | POA: Diagnosis not present

## 2022-02-20 DIAGNOSIS — J90 Pleural effusion, not elsewhere classified: Secondary | ICD-10-CM | POA: Diagnosis not present

## 2022-02-20 DIAGNOSIS — J9601 Acute respiratory failure with hypoxia: Secondary | ICD-10-CM | POA: Diagnosis not present

## 2022-02-20 DIAGNOSIS — R0902 Hypoxemia: Secondary | ICD-10-CM | POA: Diagnosis not present

## 2022-02-20 DIAGNOSIS — I110 Hypertensive heart disease with heart failure: Secondary | ICD-10-CM | POA: Diagnosis not present

## 2022-02-20 DIAGNOSIS — I5032 Chronic diastolic (congestive) heart failure: Secondary | ICD-10-CM | POA: Diagnosis not present

## 2022-02-20 DIAGNOSIS — B250 Cytomegaloviral pneumonitis: Secondary | ICD-10-CM | POA: Diagnosis not present

## 2022-02-26 DIAGNOSIS — R7989 Other specified abnormal findings of blood chemistry: Secondary | ICD-10-CM | POA: Insufficient documentation

## 2022-03-01 DIAGNOSIS — Z940 Kidney transplant status: Secondary | ICD-10-CM | POA: Diagnosis not present

## 2022-03-01 DIAGNOSIS — I10 Essential (primary) hypertension: Secondary | ICD-10-CM | POA: Diagnosis not present

## 2022-03-01 DIAGNOSIS — Z888 Allergy status to other drugs, medicaments and biological substances status: Secondary | ICD-10-CM | POA: Diagnosis not present

## 2022-03-01 DIAGNOSIS — D849 Immunodeficiency, unspecified: Secondary | ICD-10-CM | POA: Diagnosis not present

## 2022-03-01 DIAGNOSIS — E785 Hyperlipidemia, unspecified: Secondary | ICD-10-CM | POA: Diagnosis not present

## 2022-03-01 DIAGNOSIS — Z79624 Long term (current) use of inhibitors of nucleotide synthesis: Secondary | ICD-10-CM | POA: Diagnosis not present

## 2022-03-01 DIAGNOSIS — Z4822 Encounter for aftercare following kidney transplant: Secondary | ICD-10-CM | POA: Diagnosis not present

## 2022-03-01 DIAGNOSIS — R0602 Shortness of breath: Secondary | ICD-10-CM | POA: Diagnosis not present

## 2022-03-01 DIAGNOSIS — R079 Chest pain, unspecified: Secondary | ICD-10-CM | POA: Diagnosis not present

## 2022-03-01 DIAGNOSIS — B259 Cytomegaloviral disease, unspecified: Secondary | ICD-10-CM | POA: Diagnosis not present

## 2022-03-01 DIAGNOSIS — Z7952 Long term (current) use of systemic steroids: Secondary | ICD-10-CM | POA: Diagnosis not present

## 2022-03-01 DIAGNOSIS — Z792 Long term (current) use of antibiotics: Secondary | ICD-10-CM | POA: Diagnosis not present

## 2022-03-01 DIAGNOSIS — R590 Localized enlarged lymph nodes: Secondary | ICD-10-CM | POA: Diagnosis not present

## 2022-03-01 DIAGNOSIS — D869 Sarcoidosis, unspecified: Secondary | ICD-10-CM | POA: Diagnosis not present

## 2022-03-01 DIAGNOSIS — R634 Abnormal weight loss: Secondary | ICD-10-CM | POA: Diagnosis not present

## 2022-03-01 DIAGNOSIS — R0600 Dyspnea, unspecified: Secondary | ICD-10-CM | POA: Diagnosis not present

## 2022-03-01 DIAGNOSIS — Z79621 Long term (current) use of calcineurin inhibitor: Secondary | ICD-10-CM | POA: Diagnosis not present

## 2022-03-01 DIAGNOSIS — D649 Anemia, unspecified: Secondary | ICD-10-CM | POA: Diagnosis not present

## 2022-03-01 DIAGNOSIS — I5021 Acute systolic (congestive) heart failure: Secondary | ICD-10-CM | POA: Diagnosis not present

## 2022-03-01 DIAGNOSIS — E119 Type 2 diabetes mellitus without complications: Secondary | ICD-10-CM | POA: Diagnosis not present

## 2022-03-01 DIAGNOSIS — R5383 Other fatigue: Secondary | ICD-10-CM | POA: Diagnosis not present

## 2022-03-05 ENCOUNTER — Telehealth: Payer: Self-pay | Admitting: *Deleted

## 2022-03-05 ENCOUNTER — Encounter: Payer: Self-pay | Admitting: *Deleted

## 2022-03-05 NOTE — Patient Outreach (Signed)
  Care Coordination TOC Note Transition Care Management Follow-up Telephone Call Date of discharge and from where: Monday, 02/26/22 [facility]; SOB; hypoxia; volume overload How have you been since you were released from the hospital? "I am doing okay for the most part-- I am breathing better-- but I am so fatigued and tired.... I feel exhausted and I don't know why.... I am going to see the kidney team tomorrow and the heart doctor later this week, and I am going to ask them to tell me what is going on, because other than my breathing being better, I feel as bad as I did when I went to the hospital; I am taking my medicine and the new fluid pill the way they told me to" Any questions or concerns? No  Items Reviewed: Did the pt receive and understand the discharge instructions provided? Yes  Medications obtained and verified? Yes  Other? No  Any new allergies since your discharge? No  Dietary orders reviewed? Yes Do you have support at home? Yes  resides with brother who assists with ADL's and iADL's as needed/ indicated  Home Care and Equipment/Supplies: Were home health services ordered? No- patient reports he thinks he may need home health services: provided education to patient that this will need to be ordered by care provider-- coached patient around talking points to discuss with kidney specialists and cardiologist during scheduled appointments this week If so, what is the name of the agency? N/A  Has the agency set up a time to come to the patient's home? no Were any new equipment or medical supplies ordered?  No What is the name of the medical supply agency? N/A Were you able to get the supplies/equipment? not applicable Do you have any questions related to the use of the equipment or supplies? No N/A  Functional Questionnaire: (I = Independent and D = Dependent) ADLs: I  resides with brother who assists with ADL's and iADL's as needed/ indicated  Bathing/Dressing-  I  resides with brother who assists with ADL's and iADL's as needed/ indicated  Meal Prep- I  resides with brother who assists with ADL's and iADL's as needed/ indicated  Eating- I  Maintaining continence- I  Transferring/Ambulation- I  Managing Meds- I  Follow up appointments reviewed:  PCP Hospital f/u appt confirmed? No  Scheduled to see - on - @ - encouraged patient to consider scheduling an appointment with PCP [facility] f/u appt confirmed? Yes  Scheduled to see kidney specialists provider team on 03/06/22 @ 9:00 am Are transportation arrangements needed? No  If their condition worsens, is the pt aware to call PCP or go to the Emergency Dept.? Yes Was the patient provided with contact information for the PCP's office or ED? Yes Was to pt encouraged to call back with questions or concerns? Yes  SDOH assessments and interventions completed:   Yes  Care Coordination Interventions Activated:  Yes   Care Coordination Interventions:  Provided education around possible side effects of newly prescribed diuretic; provided education and coaching around process to initiate home health services when he attends tomorrow's scheduled provider office visit    Encounter Outcome:  Pt. Visit Completed    Oneta Rack, RN, BSN, CCRN Alumnus RN CM Care Coordination/ Transition of [place] Management [phone]: direct office

## 2022-03-06 DIAGNOSIS — Z79621 Long term (current) use of calcineurin inhibitor: Secondary | ICD-10-CM | POA: Diagnosis not present

## 2022-03-06 DIAGNOSIS — Z7952 Long term (current) use of systemic steroids: Secondary | ICD-10-CM | POA: Diagnosis not present

## 2022-03-06 DIAGNOSIS — D649 Anemia, unspecified: Secondary | ICD-10-CM | POA: Diagnosis not present

## 2022-03-06 DIAGNOSIS — E119 Type 2 diabetes mellitus without complications: Secondary | ICD-10-CM | POA: Diagnosis not present

## 2022-03-06 DIAGNOSIS — D849 Immunodeficiency, unspecified: Secondary | ICD-10-CM | POA: Diagnosis not present

## 2022-03-06 DIAGNOSIS — D869 Sarcoidosis, unspecified: Secondary | ICD-10-CM | POA: Diagnosis not present

## 2022-03-06 DIAGNOSIS — Z4822 Encounter for aftercare following kidney transplant: Secondary | ICD-10-CM | POA: Diagnosis not present

## 2022-03-06 DIAGNOSIS — R5383 Other fatigue: Secondary | ICD-10-CM | POA: Diagnosis not present

## 2022-03-06 DIAGNOSIS — I10 Essential (primary) hypertension: Secondary | ICD-10-CM | POA: Diagnosis not present

## 2022-03-06 DIAGNOSIS — Z7969 Long term (current) use of other immunomodulators and immunosuppressants: Secondary | ICD-10-CM | POA: Diagnosis not present

## 2022-03-06 DIAGNOSIS — B259 Cytomegaloviral disease, unspecified: Secondary | ICD-10-CM | POA: Diagnosis not present

## 2022-03-06 DIAGNOSIS — Z792 Long term (current) use of antibiotics: Secondary | ICD-10-CM | POA: Diagnosis not present

## 2022-03-06 DIAGNOSIS — E785 Hyperlipidemia, unspecified: Secondary | ICD-10-CM | POA: Diagnosis not present

## 2022-03-06 DIAGNOSIS — Z940 Kidney transplant status: Secondary | ICD-10-CM | POA: Diagnosis not present

## 2022-03-06 DIAGNOSIS — Z794 Long term (current) use of insulin: Secondary | ICD-10-CM | POA: Diagnosis not present

## 2022-03-06 DIAGNOSIS — Z5181 Encounter for therapeutic drug level monitoring: Secondary | ICD-10-CM | POA: Diagnosis not present

## 2022-03-06 DIAGNOSIS — Z79899 Other long term (current) drug therapy: Secondary | ICD-10-CM | POA: Diagnosis not present

## 2022-03-07 DIAGNOSIS — R942 Abnormal results of pulmonary function studies: Secondary | ICD-10-CM | POA: Diagnosis not present

## 2022-03-13 DIAGNOSIS — Z79621 Long term (current) use of calcineurin inhibitor: Secondary | ICD-10-CM | POA: Diagnosis not present

## 2022-03-13 DIAGNOSIS — Z794 Long term (current) use of insulin: Secondary | ICD-10-CM | POA: Diagnosis not present

## 2022-03-13 DIAGNOSIS — Z4822 Encounter for aftercare following kidney transplant: Secondary | ICD-10-CM | POA: Diagnosis not present

## 2022-03-13 DIAGNOSIS — Z5181 Encounter for therapeutic drug level monitoring: Secondary | ICD-10-CM | POA: Diagnosis not present

## 2022-03-13 DIAGNOSIS — D869 Sarcoidosis, unspecified: Secondary | ICD-10-CM | POA: Diagnosis not present

## 2022-03-13 DIAGNOSIS — I10 Essential (primary) hypertension: Secondary | ICD-10-CM | POA: Diagnosis not present

## 2022-03-13 DIAGNOSIS — E1121 Type 2 diabetes mellitus with diabetic nephropathy: Secondary | ICD-10-CM | POA: Diagnosis not present

## 2022-03-13 DIAGNOSIS — B259 Cytomegaloviral disease, unspecified: Secondary | ICD-10-CM | POA: Diagnosis not present

## 2022-03-13 DIAGNOSIS — Z940 Kidney transplant status: Secondary | ICD-10-CM | POA: Diagnosis not present

## 2022-03-13 DIAGNOSIS — Z79899 Other long term (current) drug therapy: Secondary | ICD-10-CM | POA: Diagnosis not present

## 2022-03-13 DIAGNOSIS — R5382 Chronic fatigue, unspecified: Secondary | ICD-10-CM | POA: Diagnosis not present

## 2022-03-13 DIAGNOSIS — D849 Immunodeficiency, unspecified: Secondary | ICD-10-CM | POA: Diagnosis not present

## 2022-03-16 DIAGNOSIS — R599 Enlarged lymph nodes, unspecified: Secondary | ICD-10-CM | POA: Diagnosis not present

## 2022-03-16 DIAGNOSIS — R590 Localized enlarged lymph nodes: Secondary | ICD-10-CM | POA: Diagnosis not present

## 2022-03-16 DIAGNOSIS — Z7722 Contact with and (suspected) exposure to environmental tobacco smoke (acute) (chronic): Secondary | ICD-10-CM | POA: Diagnosis not present

## 2022-03-16 DIAGNOSIS — R634 Abnormal weight loss: Secondary | ICD-10-CM | POA: Diagnosis not present

## 2022-03-16 DIAGNOSIS — D869 Sarcoidosis, unspecified: Secondary | ICD-10-CM | POA: Diagnosis not present

## 2022-03-16 DIAGNOSIS — R918 Other nonspecific abnormal finding of lung field: Secondary | ICD-10-CM | POA: Diagnosis not present

## 2022-03-16 DIAGNOSIS — R0602 Shortness of breath: Secondary | ICD-10-CM | POA: Diagnosis not present

## 2022-03-16 DIAGNOSIS — I110 Hypertensive heart disease with heart failure: Secondary | ICD-10-CM | POA: Diagnosis not present

## 2022-03-16 DIAGNOSIS — R911 Solitary pulmonary nodule: Secondary | ICD-10-CM | POA: Diagnosis not present

## 2022-03-16 DIAGNOSIS — D849 Immunodeficiency, unspecified: Secondary | ICD-10-CM | POA: Diagnosis not present

## 2022-03-16 DIAGNOSIS — I509 Heart failure, unspecified: Secondary | ICD-10-CM | POA: Diagnosis not present

## 2022-03-17 ENCOUNTER — Other Ambulatory Visit: Payer: Self-pay | Admitting: Endocrinology

## 2022-03-20 ENCOUNTER — Ambulatory Visit (INDEPENDENT_AMBULATORY_CARE_PROVIDER_SITE_OTHER): Payer: Medicare Other | Admitting: Podiatry

## 2022-03-20 ENCOUNTER — Encounter: Payer: Self-pay | Admitting: Podiatry

## 2022-03-20 DIAGNOSIS — Z940 Kidney transplant status: Secondary | ICD-10-CM | POA: Diagnosis not present

## 2022-03-20 DIAGNOSIS — B351 Tinea unguium: Secondary | ICD-10-CM | POA: Diagnosis not present

## 2022-03-20 DIAGNOSIS — Z794 Long term (current) use of insulin: Secondary | ICD-10-CM | POA: Diagnosis not present

## 2022-03-20 DIAGNOSIS — E119 Type 2 diabetes mellitus without complications: Secondary | ICD-10-CM

## 2022-03-20 DIAGNOSIS — L84 Corns and callosities: Secondary | ICD-10-CM | POA: Diagnosis not present

## 2022-03-20 DIAGNOSIS — E1121 Type 2 diabetes mellitus with diabetic nephropathy: Secondary | ICD-10-CM

## 2022-03-20 DIAGNOSIS — M79676 Pain in unspecified toe(s): Secondary | ICD-10-CM | POA: Diagnosis not present

## 2022-03-20 NOTE — Progress Notes (Signed)
ANNUAL DIABETIC FOOT EXAM  Subjective: Brandon Carroll presents today for annual diabetic foot examination.  Patient confirms h/o diabetes.  Patient states he has been hospitalized a couple of times since his last visit.  Patient denies any h/o foot wounds.  Patient has been diagnosed with neuropathy.  Risk factors:  recipient of deceased-donor kidney, CMV, diabetes, HTN, CAD, CHF, hyperlipidemia, immunosuppressive medications.  Brandon Koch, MD.  Past Medical History:  Diagnosis Date   A-V fistula ([place]) 03/12/2019   Abnormal CT of the chest 06/08/2015   Abnormal echocardiogram 26/33/3545   Acute systolic CHF (congestive heart failure), NYHA class 2 ([place]) 05/04/2015   Acute-on-chronic kidney injury ([place]) 05/04/2015   Anemia 06/06/2015   Anemia in chronic kidney disease 11/18/2018   Body mass index (BMI) 27.0-27.9, adult 10/13/5636   Chronic systolic CHF (congestive heart failure) ([place]) 06/15/2015   Coronary artery disease 02/17/2018   Cardiac catheterization 2017 showing 10% LAD and 10% RCA Echocardiogram and stress test done in December 2019 showed evidence of 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  Anticipate improvement as glucose control improves.    Hypertension    Iron deficiency anemia, unspecified 11/03/2018   Low testosterone 10/04/2015   12/01/2015 visit with Festus Aloe, MD at [facility].    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   Secondary hyperparathyroidism ([place]) 10/20/2018   Patient Active Problem List   Diagnosis Date Noted   Elevated serum creatinine 02/26/2022   Bacterial pneumonia 02/05/2022   Candidiasis of mouth and esophagus ([place]) 01/18/2022   Donor specific antibody (DSA) positive 09/13/2021   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   Volume depletion 02/14/2021   Neck pain 02-09-2021   Encounter for general adult medical examination with abnormal findings 02-09-2021   Deceased-donor kidney transplant 07/21/2020   Immunosuppression ([place]) 07/21/2020   Hypertension    Type 2 diabetes mellitus with diabetic nephropathy ([place]) 03/12/2019   A-V fistula ([place]) 03/12/2019   Coagulation defect, unspecified ([place]) 12/10/2018   Anemia in chronic kidney disease 11/18/2018   Mild protein-calorie malnutrition ([place]) 11/14/2018   Iron deficiency anemia, unspecified 11/03/2018   Hyperlipidemia 10/20/2018   Nausea and vomiting 10/16/2018   Coronary artery disease 02/17/2018   Essential hypertension 07/03/2016   Sarcoidosis 07/02/2016   Erectile dysfunction 93/73/4287   Chronic systolic CHF (congestive heart failure) ([place]) 06/15/2015   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   Hyperlipidemia associated with type 2 diabetes mellitus ([place]) 09/18/2011   Past Surgical History:  Procedure Laterality Date   AV FISTULA PLACEMENT Left 05/26/2018   Procedure: ARTERIOVENOUS (AV) FISTULA CREATION LEFT ARM;  Surgeon: Marty Heck, MD;  Location: [facility] OR;  Service: Vascular;  Laterality: Left;   CARDIAC CATHETERIZATION N/A 06/15/2015   Procedure: Left Heart  Cath and Coronary Angiography;  Surgeon: Peter M Martinique, MD;   Location: [place] CV LAB;  Service: Cardiovascular;  Laterality: N/A;   CHOLECYSTECTOMY N/A 06/28/2017   Procedure: LAPAROSCOPIC CHOLECYSTECTOMY;  Surgeon: Ralene Ok, MD;  Location: [facility] ORS;  Service: General;  Laterality: N/A;   HERNIA REPAIR  7th grade   umbilical   LEFT HEART CATH AND CORONARY ANGIOGRAPHY N/A 04/28/2019   Procedure: LEFT HEART CATH AND CORONARY ANGIOGRAPHY;  Surgeon: Leonie Man, MD;  Location: [place] CV LAB;  Service: Cardiovascular;  Laterality: N/A;   Left Kidney Transplant  07/12/2020   Current Outpatient Medications on File Prior to Visit  Medication Sig Dispense Refill   albuterol (PROVENTIL) (2.5 MG/3ML) 0.083% nebulizer solution Take 3 mLs (2.5 mg total) by nebulization every 6 (six) hours as needed for wheezing or shortness of breath. 75 mL 5   aspirin 81 MG EC tablet Take 1 tablet by mouth daily.     carvedilol (COREG) 6.25 MG tablet Take by mouth.     Continuous Blood Gluc Receiver (FREESTYLE LIBRE 2 READER) DEVI See admin instructions.     hydrALAZINE (APRESOLINE) 25 MG tablet Take 1 tablet (25 mg total) by mouth 3 (three) times daily. TAKE 1 TABLET(25 MG) BY MOUTH THREE TIMES DAILY Strength: 25 mg 270 tablet 3   insulin degludec (TRESIBA FLEXTOUCH) 200 UNIT/ML FlexTouch Pen ADMINISTER UP TO 40 UNITS UNDER THE SKIN EVERY DAY 9 mL 1   Insulin Lispro w/ Trans Port 100 UNIT/ML SOPN Inject 5 Units into the skin 3 (three) times daily.     isosorbide mononitrate (IMDUR) 120 MG 24 hr tablet Take 1 tablet (120 mg total) by mouth daily. 90 tablet 3   latanoprost (XALATAN) 0.005 % ophthalmic solution Place 1 drop into both eyes at bedtime.     magnesium oxide (MAG-OX) 400 MG tablet Take 2 tablets by mouth daily.     mycophenolate (MYFORTIC) 180 MG EC tablet Take 2 tablets by mouth 2 (two) times daily.     NIFEdipine (PROCARDIA-XL/NIFEDICAL-XL) 30 MG 24 hr tablet Take 2 tablets by mouth daily.     pantoprazole (PROTONIX) 40 MG tablet Take 1 tablet by  mouth daily.     polyethylene glycol powder (GLYCOLAX/MIRALAX) 17 GM/SCOOP powder Take 17 g by mouth as needed for constipation.     tacrolimus (PROGRAF) 1 MG capsule Take by mouth. 6 in the morning and 6 in the evening.     valGANciclovir (VALCYTE) 450 MG tablet Take 450 mg by mouth 2 (two) times daily.     No current facility-administered medications on file prior to visit.    Allergies  Allergen Reactions   Lipitor [Atorvastatin] Hives and Itching   Social History   Occupational History   Occupation: Building services engineer: [employer]  Tobacco Use   Smoking status: Never    Passive exposure: Never   Smokeless tobacco: Never  Vaping Use   Vaping Use: Never used  Substance and Sexual Activity   Alcohol use: No    Alcohol/week: 0.0 standard drinks of alcohol   Drug use: No   Sexual activity: Not Currently    Comment: Erectile dysfunciotn   Family History  Problem Relation Age of Onset   Arthritis Mother    COPD Mother    Diabetes Mother        was thin, took insulin   Hypertension Mother    Hypertension Father    Arthritis Sister  rheumatoid   COPD Sister    Diabetes Brother    Colon cancer Neg Hx    Immunization History  Administered Date(s) Administered   Fluad Quad(high Dose 65+) 01/27/2021   Hepatitis B, adult 11/07/2018, 12/05/2018, 01/09/2019, 05/18/2019   Influenza Inj Mdck Quad Pf 02/16/2019   Influenza, Seasonal, Injecte, Preservative Fre 07/22/2012   Influenza,inj,Quad PF,6+ Mos 08/04/2014, 07/02/2016, 03/21/2017, 02/09/2019, 03/09/2020   Moderna Sars-Covid-2 Vaccination 07/24/2019, 08/21/2019   Pneumococcal Conjugate-13 11/05/2018   Pneumococcal Polysaccharide-23 07/22/2012, 05/03/2015   Tdap 05/14/2010     Review of Systems: Negative except as noted in the HPI.   Objective: There were no vitals filed for this visit.  Brandon Carroll is a pleasant 61 y.o. male in NAD. AAO X 3.  Vascular Examination: Capillary refill time  immediate b/l.Vascular status intact b/l with palpable pedal pulses. Pedal hair present b/l. No edema. No pain with calf compression b/l. Skin temperature gradient WNL b/l. No edema noted b/l LE. No cyanosis or clubbing noted b/l LE.  Neurological Examination: Sensation grossly intact b/l with 10 gram monofilament. Vibratory sensation intact b/l.   Dermatological Examination: Pedal skin with normal turgor, texture and tone b/l. Toenails 1-5 b/l thick, discolored, elongated with subungual debris and pain on dorsal palpation. No open wounds b/l LE. No interdigital macerations noted b/l LE. Hyperkeratotic lesion(s) b/l feet and submet head 5 left foot.  No erythema, no edema, no drainage, no fluctuance.  Musculoskeletal Examination: Normal muscle strength 5/5 to all lower extremity muscle groups bilaterally. Adductovarus deformity bilateral 5th toes.. No pain, crepitus or joint limitation noted with ROM b/l LE.  Patient ambulates independently without assistive aids.  Radiographs: None  Last A1c:      Latest Ref Rng & Units 12/21/2021    1:43 PM 09/08/2021    1:40 PM 06/29/2021    7:58 AM 04/24/2021    8:52 AM  Hemoglobin A1C  Hemoglobin-A1c 4.0 - 5.6 % 7.4  7.4  9.2  8.0    Footwear Assessment: Does the patient wear appropriate shoes? Yes. Does the patient need inserts/orthotics? Yes.  ADA Risk Categorization: Low Risk :  Patient has all of the following: Intact protective sensation No prior foot ulcer  No severe deformity Pedal pulses present  Assessment: 1. Pain due to onychomycosis of toenail   2. Callus   3. Type 2 diabetes mellitus with diabetic nephropathy, with long-term current use of insulin ([place])   4. Deceased-donor kidney transplant   5. Encounter for diabetic foot exam ([place])      Plan: -Examined patient. -Diabetic foot examination performed today. -Continue diabetic foot care principles: inspect feet daily, monitor glucose as recommended by PCP and/or  Endocrinologist, and follow prescribed diet per PCP, Endocrinologist and/or dietician. -Mycotic toenails 1-5 bilaterally were debrided in length and girth with sterile nail nippers and dremel without incident. -Callus(es) bilateral heels and submet head 5 left foot pared utilizing sterile scalpel blade without complication or incident. Total number debrided =3. -Patient/POA to call should there be question/concern in the interim. Return in about 3 months (around 06/20/2022).  Marzetta Board, DPM

## 2022-03-22 DIAGNOSIS — E1121 Type 2 diabetes mellitus with diabetic nephropathy: Secondary | ICD-10-CM | POA: Diagnosis not present

## 2022-03-22 DIAGNOSIS — R5383 Other fatigue: Secondary | ICD-10-CM | POA: Diagnosis not present

## 2022-03-22 DIAGNOSIS — Z940 Kidney transplant status: Secondary | ICD-10-CM | POA: Diagnosis not present

## 2022-03-22 DIAGNOSIS — D849 Immunodeficiency, unspecified: Secondary | ICD-10-CM | POA: Diagnosis not present

## 2022-03-22 DIAGNOSIS — Z794 Long term (current) use of insulin: Secondary | ICD-10-CM | POA: Diagnosis not present

## 2022-03-22 DIAGNOSIS — D869 Sarcoidosis, unspecified: Secondary | ICD-10-CM | POA: Diagnosis not present

## 2022-03-22 DIAGNOSIS — Z79899 Other long term (current) drug therapy: Secondary | ICD-10-CM | POA: Diagnosis not present

## 2022-03-28 DIAGNOSIS — R5383 Other fatigue: Secondary | ICD-10-CM | POA: Diagnosis not present

## 2022-03-28 DIAGNOSIS — D869 Sarcoidosis, unspecified: Secondary | ICD-10-CM | POA: Diagnosis not present

## 2022-03-28 DIAGNOSIS — Z79899 Other long term (current) drug therapy: Secondary | ICD-10-CM | POA: Diagnosis not present

## 2022-04-02 DIAGNOSIS — I110 Hypertensive heart disease with heart failure: Secondary | ICD-10-CM | POA: Diagnosis not present

## 2022-04-02 DIAGNOSIS — B258 Other cytomegaloviral diseases: Secondary | ICD-10-CM | POA: Diagnosis not present

## 2022-04-02 DIAGNOSIS — D649 Anemia, unspecified: Secondary | ICD-10-CM | POA: Diagnosis not present

## 2022-04-02 DIAGNOSIS — E785 Hyperlipidemia, unspecified: Secondary | ICD-10-CM | POA: Diagnosis not present

## 2022-04-02 DIAGNOSIS — D8989 Other specified disorders involving the immune mechanism, not elsewhere classified: Secondary | ICD-10-CM | POA: Diagnosis not present

## 2022-04-02 DIAGNOSIS — Z792 Long term (current) use of antibiotics: Secondary | ICD-10-CM | POA: Diagnosis not present

## 2022-04-02 DIAGNOSIS — Z940 Kidney transplant status: Secondary | ICD-10-CM | POA: Diagnosis not present

## 2022-04-02 DIAGNOSIS — E119 Type 2 diabetes mellitus without complications: Secondary | ICD-10-CM | POA: Diagnosis not present

## 2022-04-02 DIAGNOSIS — I509 Heart failure, unspecified: Secondary | ICD-10-CM | POA: Diagnosis not present

## 2022-04-10 DIAGNOSIS — Z4822 Encounter for aftercare following kidney transplant: Secondary | ICD-10-CM | POA: Diagnosis not present

## 2022-04-10 DIAGNOSIS — Z79899 Other long term (current) drug therapy: Secondary | ICD-10-CM | POA: Diagnosis not present

## 2022-04-11 DIAGNOSIS — Z79899 Other long term (current) drug therapy: Secondary | ICD-10-CM | POA: Diagnosis not present

## 2022-04-11 DIAGNOSIS — R5383 Other fatigue: Secondary | ICD-10-CM | POA: Diagnosis not present

## 2022-04-11 DIAGNOSIS — D869 Sarcoidosis, unspecified: Secondary | ICD-10-CM | POA: Diagnosis not present

## 2022-04-16 DIAGNOSIS — D860 Sarcoidosis of lung: Secondary | ICD-10-CM | POA: Diagnosis not present

## 2022-04-17 ENCOUNTER — Ambulatory Visit: Payer: Medicare Other | Attending: Cardiology | Admitting: Cardiology

## 2022-04-17 ENCOUNTER — Encounter: Payer: Self-pay | Admitting: Cardiology

## 2022-04-17 VITALS — BP 120/78 | HR 78 | Ht 70.0 in | Wt 161.0 lb

## 2022-04-17 DIAGNOSIS — I2510 Atherosclerotic heart disease of native coronary artery without angina pectoris: Secondary | ICD-10-CM

## 2022-04-17 DIAGNOSIS — Z940 Kidney transplant status: Secondary | ICD-10-CM | POA: Diagnosis not present

## 2022-04-17 DIAGNOSIS — E1169 Type 2 diabetes mellitus with other specified complication: Secondary | ICD-10-CM

## 2022-04-17 DIAGNOSIS — I10 Essential (primary) hypertension: Secondary | ICD-10-CM

## 2022-04-17 DIAGNOSIS — I5032 Chronic diastolic (congestive) heart failure: Secondary | ICD-10-CM | POA: Diagnosis not present

## 2022-04-17 DIAGNOSIS — E785 Hyperlipidemia, unspecified: Secondary | ICD-10-CM | POA: Diagnosis not present

## 2022-04-17 NOTE — Progress Notes (Signed)
Cardiology Office Note:    Date:  04/17/2022   ID:  Brandon Carroll, DOB Nov 05, 1960, MRN [id number]  PCP:  Hoyt Koch, MD  Cardiologist:  Jenne Campus, MD    Referring MD: Hoyt Koch, *   Chief Complaint  Patient presents with   Follow-up   Doing fine  History of Present Illness:    Brandon Carroll is a 61 y.o. male with past medical history significant for coronary artery disease but only luminal disease based on cardiac catheterization from 2017 he was find to have 10% LAD 10% RCA.  Status post kidney transplant done 2022, apparently at that time cardiac catheterization done showed moderate disease of the proximal RCA as well as ostial left circumflex.  No critical lesion has been identified.  Additional problems include essential hypertension, diabetes. He comes today to my office for follow-up.  At the end of October he end up being in the hospital for few days.  The reason was decompensated congestive heart failure, echocardiogram showed preserved left ventricle ejection fraction send diagnosis of heart failure with preserved left ventricle ejection fraction was made.  He was excellently managed with diuretic as well as addition of Jardiance.  He is feeling much better.  Denies have any chest pain tightness squeezing pressure burning chest.  He slept last night on the couch and today complained of having back pain.  Past Medical History:  Diagnosis Date   A-V fistula ([place]) 03/12/2019   Abnormal CT of the chest 06/08/2015   Abnormal echocardiogram 98/92/1194   Acute systolic CHF (congestive heart failure), NYHA class 2 ([place]) 05/04/2015   Acute-on-chronic kidney injury ([place]) 05/04/2015   Anemia 06/06/2015   Anemia in chronic kidney disease 11/18/2018   Body mass index (BMI) 27.0-27.9, adult 05/21/4079   Chronic systolic CHF (congestive heart failure) ([place]) 06/15/2015   Coronary artery disease 02/17/2018   Cardiac catheterization 2017 showing 10% LAD and 10%  RCA Echocardiogram and stress test done in December 2019 showed evidence of 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  Anticipate improvement as glucose control improves.    Hypertension    Iron deficiency anemia, unspecified 11/03/2018   Low testosterone 10/04/2015   12/01/2015 visit with Festus Aloe, MD at [facility].    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   Secondary hyperparathyroidism ([place]) 10/20/2018    Past Surgical History:  Procedure Laterality Date   AV FISTULA PLACEMENT Left 05/26/2018   Procedure: ARTERIOVENOUS (AV) FISTULA CREATION LEFT ARM;  Surgeon: Marty Heck, MD;  Location: [facility];  Service: Vascular;  Laterality: Left;   CARDIAC CATHETERIZATION N/A 06/15/2015   Procedure: Left Heart Cath and Coronary Angiography;  Surgeon: Peter M Martinique, MD;  Location: [place] [facility];  Service: Cardiovascular;  Laterality: N/A;   CHOLECYSTECTOMY N/A 06/28/2017   Procedure: LAPAROSCOPIC CHOLECYSTECTOMY;  Surgeon: Rosendo Gros,  Anne Hahn, MD;  Location: [facility];  Service: General;  Laterality: N/A;   HERNIA REPAIR  7th grade   umbilical   LEFT HEART CATH AND CORONARY ANGIOGRAPHY N/A 04/28/2019   Procedure: LEFT HEART CATH AND CORONARY ANGIOGRAPHY;  Surgeon: Leonie Man, MD;  Location: [facility];  Service: Cardiovascular;  Laterality: N/A;   Left Kidney Transplant  07/12/2020    Current Medications: Current Meds  Medication Sig   albuterol (PROVENTIL) (2.5 MG/3ML) 0.083% nebulizer solution Take 3 mLs (2.5 mg total) by nebulization every 6 (six) hours as needed for wheezing or shortness of breath.   aspirin 81 MG EC tablet Take 1 tablet by mouth daily.   carvedilol (COREG) 6.25 MG tablet Take 6.25 mg by mouth 2 (two) times daily with a meal.   Continuous Blood Gluc Receiver (FREESTYLE LIBRE 2 READER) DEVI 1 each by Other route See admin instructions. Glucose check   hydrALAZINE (APRESOLINE) 25 MG tablet Take 1 tablet (25 mg total) by mouth 3 (three) times daily. TAKE 1 TABLET(25 MG) BY MOUTH THREE TIMES DAILY Strength: 25 mg   insulin degludec (TRESIBA FLEXTOUCH) 200 UNIT/ML FlexTouch Pen ADMINISTER UP TO 40 UNITS UNDER THE SKIN EVERY DAY (Patient taking differently: Inject 40 Units into the skin daily. ADMINISTER UP TO 40 UNITS UNDER THE SKIN EVERY DAY)   Insulin Lispro w/ Trans Port 100 UNIT/ML SOPN Inject 5 Units into the skin 3 (three) times daily.   isosorbide mononitrate (IMDUR) 120 MG 24 hr tablet Take 1 tablet (120 mg total) by mouth daily.   latanoprost (XALATAN) 0.005 % ophthalmic solution Place 1 drop into both eyes at bedtime.   magnesium oxide (MAG-OX) 400 MG tablet Take 2 tablets by mouth daily.   mycophenolate (MYFORTIC) 180 MG EC tablet Take 2 tablets by mouth 2 (two) times daily.   NIFEdipine (PROCARDIA-XL/NIFEDICAL-XL) 30 MG 24 hr tablet Take 2 tablets by mouth daily.   tacrolimus (PROGRAF) 1 MG capsule Take 1 mg by mouth 2 (two) times daily. 6 in the morning and 6 in the evening.   [DISCONTINUED] pantoprazole (PROTONIX) 40 MG tablet Take 1 tablet by mouth daily.   [DISCONTINUED] polyethylene glycol powder (GLYCOLAX/MIRALAX) 17 GM/SCOOP powder Take 17 g by mouth as needed for constipation.   [DISCONTINUED] valGANciclovir (VALCYTE)  450 MG tablet Take 450 mg by mouth 2 (two) times daily.     Allergies:   Lipitor [atorvastatin]   Social History   Socioeconomic History   Marital status: Divorced    Spouse name: n/a   Number of children: 1   Years of education: 12+   Highest education level: Not on file  Occupational History   Occupation: Building services engineer: [employer]  Tobacco Use   Smoking status: Never    Passive exposure: Never   Smokeless tobacco: Never  Vaping Use   Vaping Use: Never used  Substance and Sexual Activity   Alcohol use: No    Alcohol/week: 0.0 standard drinks of alcohol   Drug use: No   Sexual activity: Not Currently    Comment: Erectile dysfunciotn  Other Topics Concern   Not on file  Social History Narrative   Divorced. Education: [employer].    Unable to exercise due to significant SOB and DOE.    Lives alone.   Son lives in [place].   Social Determinants of Health   Financial Resource Strain: Not on file  Food Insecurity: No Food Insecurity (03/05/2022)   Hunger Vital Sign  Worried About Charity fundraiser in the Last Year: Never true    [place] in the Last Year: Never true  Transportation Needs: No Transportation Needs (03/05/2022)   PRAPARE - Hydrologist (Medical): No    Lack of Transportation (Non-Medical): No  Physical Activity: Not on file  Stress: Not on file  Social Connections: Not on file     Family History: The patient's family history includes Arthritis in his mother and sister; COPD in his mother and sister; Diabetes in his brother and mother; Hypertension in his father and mother. There is no history of Colon cancer. ROS:   Please see the history of present illness.    All 14 point review of systems negative except as described per history of present illness  EKGs/Labs/Other Studies Reviewed:      Recent Labs: No results found for requested labs within last 365 days.  Recent Lipid Panel    Component  Value Date/Time   CHOL 197 09/24/2017 0823   CHOL 305 (H) 03/21/2017 1124   TRIG 245.0 (H) 09/24/2017 0823   HDL 45.60 09/24/2017 0823   HDL 60 03/21/2017 1124   CHOLHDL 4 09/24/2017 0823   VLDL 49.0 (H) 09/24/2017 0823   LDLCALC 212 (H) 03/21/2017 1124   LDLDIRECT 97 04/26/2021 1025   LDLDIRECT 106.0 09/24/2017 0823    Physical Exam:    VS:  BP 120/78 (BP Location: Left Arm, Patient Position: Sitting)   Pulse 78   Ht 5\' 10"  (1.778 m)   Wt 161 lb (73 kg)   SpO2 96%   BMI 23.10 kg/m     Wt Readings from Last 3 Encounters:  04/17/22 161 lb (73 kg)  12/28/21 188 lb (85.3 kg)  12/21/21 193 lb 9.6 oz (87.8 kg)     GEN:  Well nourished, well developed in no acute distress HEENT: Normal NECK: No JVD; No carotid bruits LYMPHATICS: No lymphadenopathy CARDIAC: RRR, no murmurs, no rubs, no gallops RESPIRATORY:  Clear to auscultation without rales, wheezing or rhonchi  ABDOMEN: Soft, non-tender, non-distended MUSCULOSKELETAL:  No edema; No deformity  SKIN: Warm and dry LOWER EXTREMITIES: no swelling NEUROLOGIC:  Alert and oriented x 3 PSYCHIATRIC:  Normal affect   ASSESSMENT:    1. Chronic diastolic (congestive) heart failure ([place])   2. Coronary artery disease involving native coronary artery of native heart without angina pectoris   3. Primary hypertension   4. Hyperlipidemia associated with type 2 diabetes mellitus ([place])   5. Deceased-donor kidney transplant    PLAN:    In order of problems listed above:  Chronic diastolic congestive heart failure he is compensated on my physical exam on appropriate medication which I continue. Coronary artery disease stable on the lipid therapy which I will continue. Essential hypertension blood pressure well-controlled. Status post kidney transplant noted follow-up by transplant team I did review record from the hospital for this visit   Medication Adjustments/Labs and Tests Ordered: Current medicines are reviewed at length with  the patient today.  Concerns regarding medicines are outlined above.  No orders of the defined types were placed in this encounter.  Medication changes: No orders of the defined types were placed in this encounter.   Signed, Park Liter, MD, [facility] 04/17/2022 8:57 AM    [place]

## 2022-04-17 NOTE — Patient Instructions (Signed)

## 2022-04-30 DIAGNOSIS — I110 Hypertensive heart disease with heart failure: Secondary | ICD-10-CM | POA: Diagnosis not present

## 2022-04-30 DIAGNOSIS — E785 Hyperlipidemia, unspecified: Secondary | ICD-10-CM | POA: Diagnosis not present

## 2022-04-30 DIAGNOSIS — Z792 Long term (current) use of antibiotics: Secondary | ICD-10-CM | POA: Diagnosis not present

## 2022-04-30 DIAGNOSIS — R0602 Shortness of breath: Secondary | ICD-10-CM | POA: Diagnosis not present

## 2022-04-30 DIAGNOSIS — B258 Other cytomegaloviral diseases: Secondary | ICD-10-CM | POA: Diagnosis not present

## 2022-04-30 DIAGNOSIS — Z4822 Encounter for aftercare following kidney transplant: Secondary | ICD-10-CM | POA: Diagnosis not present

## 2022-04-30 DIAGNOSIS — R5383 Other fatigue: Secondary | ICD-10-CM | POA: Diagnosis not present

## 2022-04-30 DIAGNOSIS — Z940 Kidney transplant status: Secondary | ICD-10-CM | POA: Diagnosis not present

## 2022-04-30 DIAGNOSIS — D869 Sarcoidosis, unspecified: Secondary | ICD-10-CM | POA: Diagnosis not present

## 2022-04-30 DIAGNOSIS — Z7952 Long term (current) use of systemic steroids: Secondary | ICD-10-CM | POA: Diagnosis not present

## 2022-04-30 DIAGNOSIS — D8989 Other specified disorders involving the immune mechanism, not elsewhere classified: Secondary | ICD-10-CM | POA: Diagnosis not present

## 2022-04-30 DIAGNOSIS — Z888 Allergy status to other drugs, medicaments and biological substances status: Secondary | ICD-10-CM | POA: Diagnosis not present

## 2022-04-30 DIAGNOSIS — Z79621 Long term (current) use of calcineurin inhibitor: Secondary | ICD-10-CM | POA: Diagnosis not present

## 2022-04-30 DIAGNOSIS — D649 Anemia, unspecified: Secondary | ICD-10-CM | POA: Diagnosis not present

## 2022-04-30 DIAGNOSIS — R0609 Other forms of dyspnea: Secondary | ICD-10-CM | POA: Diagnosis not present

## 2022-04-30 DIAGNOSIS — E119 Type 2 diabetes mellitus without complications: Secondary | ICD-10-CM | POA: Diagnosis not present

## 2022-04-30 DIAGNOSIS — B370 Candidal stomatitis: Secondary | ICD-10-CM | POA: Diagnosis not present

## 2022-04-30 DIAGNOSIS — I5022 Chronic systolic (congestive) heart failure: Secondary | ICD-10-CM | POA: Diagnosis not present

## 2022-05-09 DIAGNOSIS — D860 Sarcoidosis of lung: Secondary | ICD-10-CM | POA: Diagnosis not present

## 2022-05-16 DIAGNOSIS — R0602 Shortness of breath: Secondary | ICD-10-CM | POA: Diagnosis not present

## 2022-05-16 DIAGNOSIS — D869 Sarcoidosis, unspecified: Secondary | ICD-10-CM | POA: Diagnosis not present

## 2022-05-23 DIAGNOSIS — R0602 Shortness of breath: Secondary | ICD-10-CM | POA: Diagnosis not present

## 2022-05-23 DIAGNOSIS — D869 Sarcoidosis, unspecified: Secondary | ICD-10-CM | POA: Diagnosis not present

## 2022-05-29 DIAGNOSIS — E785 Hyperlipidemia, unspecified: Secondary | ICD-10-CM | POA: Diagnosis not present

## 2022-05-29 DIAGNOSIS — Z940 Kidney transplant status: Secondary | ICD-10-CM | POA: Diagnosis not present

## 2022-05-29 DIAGNOSIS — Z79621 Long term (current) use of calcineurin inhibitor: Secondary | ICD-10-CM | POA: Diagnosis not present

## 2022-05-29 DIAGNOSIS — Z7984 Long term (current) use of oral hypoglycemic drugs: Secondary | ICD-10-CM | POA: Diagnosis not present

## 2022-05-29 DIAGNOSIS — I871 Compression of vein: Secondary | ICD-10-CM | POA: Diagnosis not present

## 2022-05-29 DIAGNOSIS — R682 Dry mouth, unspecified: Secondary | ICD-10-CM | POA: Diagnosis not present

## 2022-05-29 DIAGNOSIS — R0602 Shortness of breath: Secondary | ICD-10-CM | POA: Diagnosis not present

## 2022-05-29 DIAGNOSIS — R5383 Other fatigue: Secondary | ICD-10-CM | POA: Diagnosis not present

## 2022-05-29 DIAGNOSIS — E1121 Type 2 diabetes mellitus with diabetic nephropathy: Secondary | ICD-10-CM | POA: Diagnosis not present

## 2022-05-29 DIAGNOSIS — D649 Anemia, unspecified: Secondary | ICD-10-CM | POA: Diagnosis not present

## 2022-05-29 DIAGNOSIS — D84821 Immunodeficiency due to drugs: Secondary | ICD-10-CM | POA: Diagnosis not present

## 2022-05-29 DIAGNOSIS — Z713 Dietary counseling and surveillance: Secondary | ICD-10-CM | POA: Diagnosis not present

## 2022-05-29 DIAGNOSIS — Z5181 Encounter for therapeutic drug level monitoring: Secondary | ICD-10-CM | POA: Diagnosis not present

## 2022-05-29 DIAGNOSIS — B259 Cytomegaloviral disease, unspecified: Secondary | ICD-10-CM | POA: Diagnosis not present

## 2022-05-29 DIAGNOSIS — Z7952 Long term (current) use of systemic steroids: Secondary | ICD-10-CM | POA: Diagnosis not present

## 2022-05-29 DIAGNOSIS — Z79899 Other long term (current) drug therapy: Secondary | ICD-10-CM | POA: Diagnosis not present

## 2022-05-29 DIAGNOSIS — R0902 Hypoxemia: Secondary | ICD-10-CM | POA: Diagnosis not present

## 2022-05-29 DIAGNOSIS — Z4822 Encounter for aftercare following kidney transplant: Secondary | ICD-10-CM | POA: Diagnosis not present

## 2022-05-29 DIAGNOSIS — I10 Essential (primary) hypertension: Secondary | ICD-10-CM | POA: Diagnosis not present

## 2022-05-29 DIAGNOSIS — D849 Immunodeficiency, unspecified: Secondary | ICD-10-CM | POA: Diagnosis not present

## 2022-05-29 DIAGNOSIS — M549 Dorsalgia, unspecified: Secondary | ICD-10-CM | POA: Diagnosis not present

## 2022-05-30 DIAGNOSIS — R0602 Shortness of breath: Secondary | ICD-10-CM | POA: Diagnosis not present

## 2022-05-30 DIAGNOSIS — D869 Sarcoidosis, unspecified: Secondary | ICD-10-CM | POA: Diagnosis not present

## 2022-06-04 DIAGNOSIS — D869 Sarcoidosis, unspecified: Secondary | ICD-10-CM | POA: Diagnosis not present

## 2022-06-04 DIAGNOSIS — R0602 Shortness of breath: Secondary | ICD-10-CM | POA: Diagnosis not present

## 2022-06-06 DIAGNOSIS — R0602 Shortness of breath: Secondary | ICD-10-CM | POA: Diagnosis not present

## 2022-06-06 DIAGNOSIS — D869 Sarcoidosis, unspecified: Secondary | ICD-10-CM | POA: Diagnosis not present

## 2022-06-11 DIAGNOSIS — R0602 Shortness of breath: Secondary | ICD-10-CM | POA: Diagnosis not present

## 2022-06-11 DIAGNOSIS — D869 Sarcoidosis, unspecified: Secondary | ICD-10-CM | POA: Diagnosis not present

## 2022-06-13 DIAGNOSIS — R0602 Shortness of breath: Secondary | ICD-10-CM | POA: Diagnosis not present

## 2022-06-13 DIAGNOSIS — D869 Sarcoidosis, unspecified: Secondary | ICD-10-CM | POA: Diagnosis not present

## 2022-06-17 ENCOUNTER — Other Ambulatory Visit: Payer: Self-pay | Admitting: Endocrinology

## 2022-06-20 DIAGNOSIS — D869 Sarcoidosis, unspecified: Secondary | ICD-10-CM | POA: Diagnosis not present

## 2022-06-20 DIAGNOSIS — R0602 Shortness of breath: Secondary | ICD-10-CM | POA: Diagnosis not present

## 2022-06-25 DIAGNOSIS — D869 Sarcoidosis, unspecified: Secondary | ICD-10-CM | POA: Diagnosis not present

## 2022-06-25 DIAGNOSIS — R0602 Shortness of breath: Secondary | ICD-10-CM | POA: Diagnosis not present

## 2022-06-27 DIAGNOSIS — Z792 Long term (current) use of antibiotics: Secondary | ICD-10-CM | POA: Diagnosis not present

## 2022-06-27 DIAGNOSIS — Z4822 Encounter for aftercare following kidney transplant: Secondary | ICD-10-CM | POA: Diagnosis not present

## 2022-06-27 DIAGNOSIS — R0602 Shortness of breath: Secondary | ICD-10-CM | POA: Diagnosis not present

## 2022-06-27 DIAGNOSIS — I10 Essential (primary) hypertension: Secondary | ICD-10-CM | POA: Diagnosis not present

## 2022-06-27 DIAGNOSIS — E1121 Type 2 diabetes mellitus with diabetic nephropathy: Secondary | ICD-10-CM | POA: Diagnosis not present

## 2022-06-27 DIAGNOSIS — Z7952 Long term (current) use of systemic steroids: Secondary | ICD-10-CM | POA: Diagnosis not present

## 2022-06-27 DIAGNOSIS — D849 Immunodeficiency, unspecified: Secondary | ICD-10-CM | POA: Diagnosis not present

## 2022-06-27 DIAGNOSIS — I110 Hypertensive heart disease with heart failure: Secondary | ICD-10-CM | POA: Diagnosis not present

## 2022-06-27 DIAGNOSIS — Z79621 Long term (current) use of calcineurin inhibitor: Secondary | ICD-10-CM | POA: Diagnosis not present

## 2022-06-27 DIAGNOSIS — D869 Sarcoidosis, unspecified: Secondary | ICD-10-CM | POA: Diagnosis not present

## 2022-06-27 DIAGNOSIS — E119 Type 2 diabetes mellitus without complications: Secondary | ICD-10-CM | POA: Diagnosis not present

## 2022-06-27 DIAGNOSIS — Z940 Kidney transplant status: Secondary | ICD-10-CM | POA: Diagnosis not present

## 2022-06-27 DIAGNOSIS — E785 Hyperlipidemia, unspecified: Secondary | ICD-10-CM | POA: Diagnosis not present

## 2022-06-27 DIAGNOSIS — I5022 Chronic systolic (congestive) heart failure: Secondary | ICD-10-CM | POA: Diagnosis not present

## 2022-07-02 DIAGNOSIS — D869 Sarcoidosis, unspecified: Secondary | ICD-10-CM | POA: Diagnosis not present

## 2022-07-02 DIAGNOSIS — R0602 Shortness of breath: Secondary | ICD-10-CM | POA: Diagnosis not present

## 2022-07-09 DIAGNOSIS — R0602 Shortness of breath: Secondary | ICD-10-CM | POA: Diagnosis not present

## 2022-07-09 DIAGNOSIS — D869 Sarcoidosis, unspecified: Secondary | ICD-10-CM | POA: Diagnosis not present

## 2022-07-10 ENCOUNTER — Ambulatory Visit (INDEPENDENT_AMBULATORY_CARE_PROVIDER_SITE_OTHER): Payer: 59 | Admitting: Podiatry

## 2022-07-10 ENCOUNTER — Encounter: Payer: Self-pay | Admitting: Podiatry

## 2022-07-10 ENCOUNTER — Telehealth: Payer: Self-pay

## 2022-07-10 VITALS — BP 141/81

## 2022-07-10 DIAGNOSIS — Z794 Long term (current) use of insulin: Secondary | ICD-10-CM

## 2022-07-10 DIAGNOSIS — L84 Corns and callosities: Secondary | ICD-10-CM

## 2022-07-10 DIAGNOSIS — B351 Tinea unguium: Secondary | ICD-10-CM | POA: Diagnosis not present

## 2022-07-10 DIAGNOSIS — E1121 Type 2 diabetes mellitus with diabetic nephropathy: Secondary | ICD-10-CM | POA: Diagnosis not present

## 2022-07-10 DIAGNOSIS — M79676 Pain in unspecified toe(s): Secondary | ICD-10-CM

## 2022-07-10 DIAGNOSIS — Z4822 Encounter for aftercare following kidney transplant: Secondary | ICD-10-CM | POA: Diagnosis not present

## 2022-07-10 NOTE — Patient Outreach (Signed)
  Care Coordination   Initial Visit Note   07/10/2022 Name: Shantanu Bengochea MRN: [id number] DOB: Oct 18, 1960  Kamdyn Catino is a 62 y.o. year old male who sees Hoyt Koch, MD for primary care. I spoke with  Reymundo Lascano by phone today.  What matters to the patients health and wellness today?  Call to discuss care coordination program. Mr. Stutzman reports this is not a good time to talk and request to schedule telephone assessment appointment.    Goals Addressed             This Visit's Progress    Care coordination Activities       Interventions Today    Flowsheet Row Most Recent Value  General Interventions   General Interventions Discussed/Reviewed General Interventions Discussed, Doctor Visits  [brief introduction to care coordination program]  Doctor Visits Discussed/Reviewed PCP  [patient states his PCP is Dr. [name] and states he has to call to schedule appointment. reports has not seen due to all appointments due to kidney transplant.]            SDOH assessments and interventions completed:  No  Care Coordination Interventions:  Yes, provided   Follow up plan: Follow up call scheduled for 07/12/22    Encounter Outcome:  Pt. Visit Completed   Thea Silversmith, RN, MSN, BSN, CCM Care Management Coordinator [phone]

## 2022-07-10 NOTE — Progress Notes (Unsigned)
  Subjective:  Patient ID: Brandon Carroll, male    DOB: [birth year],  MRN: [id number]  Brandon Carroll presents to clinic today for {jgcomplaint:23593}  Chief Complaint  Patient presents with   Numbness    [facility] [id number] A1C-7.4 PCP-[place] PCP VST-2022   New problem(s): None. {jgcomplaint:23593}  PCP is Hoyt Koch, MD.  Allergies  Allergen Reactions   Lipitor [Atorvastatin] Hives and Itching    Review of Systems: Negative except as noted in the HPI.  Objective: No changes noted in today's physical examination. There were no vitals filed for this visit. Brandon Carroll is a pleasant 62 y.o. male {jgbodyhabitus:24098} AAO x 3.  Vascular Examination: Capillary refill time immediate b/l.Vascular status intact b/l with palpable pedal pulses. Pedal hair present b/l. No edema. No pain with calf compression b/l. Skin temperature gradient WNL b/l. No edema noted b/l LE. No cyanosis or clubbing noted b/l LE.  Neurological Examination: Sensation grossly intact b/l with 10 gram monofilament. Vibratory sensation intact b/l.   Dermatological Examination: Pedal skin with normal turgor, texture and tone b/l. Toenails 1-5 b/l thick, discolored, elongated with subungual debris and pain on dorsal palpation. No open wounds b/l LE. No interdigital macerations noted b/l LE.   Hyperkeratotic lesion(s) b/l feet and submet head 5 left foot.  No erythema, no edema, no drainage, no fluctuance.  Musculoskeletal Examination: Normal muscle strength 5/5 to all lower extremity muscle groups bilaterally. Adductovarus deformity bilateral 5th toes. No pain, crepitus or joint limitation noted with ROM b/l LE.  Patient ambulates independently without assistive aids.  Radiographs: None  Assessment/Plan: 1. Pain due to onychomycosis of toenail   2. Callus   3. Type 2 diabetes mellitus with diabetic nephropathy, with long-term current use of insulin (HCC)     No orders of the defined types  were placed in this encounter.   None {Jgplan:23602::"-Patient/POA to call should there be question/concern in the interim."}   No follow-ups on file.  Marzetta Board, DPM

## 2022-07-11 DIAGNOSIS — R0602 Shortness of breath: Secondary | ICD-10-CM | POA: Diagnosis not present

## 2022-07-11 DIAGNOSIS — D869 Sarcoidosis, unspecified: Secondary | ICD-10-CM | POA: Diagnosis not present

## 2022-07-12 ENCOUNTER — Ambulatory Visit: Payer: Self-pay

## 2022-07-12 NOTE — Patient Outreach (Signed)
  Care Coordination   Initial Visit Note   07/12/2022 Name: Brandon Carroll MRN: [id number] DOB: 02-08-1961  Brandon Carroll is a 62 y.o. year old male who sees Hoyt Koch, MD for primary care. I spoke with  Heberto Betancur by phone today.  What matters to the patients health and wellness today?  Mr. Lardner reports he is doing well. He reports history of diabetes. States A1C 7.4 and followed by endocrinologist Dr. Jacolyn Reedy with [facility]. He reports kidney transplant 07/18/2020-without any concerns at this time. Patient reports history of Sarcoidosis-currently active with pulmonary rehab, which he reports is helping and he expresses that he is glad he made the decision to participate in the program. Last office visit with PCP 01/27/21. Patient to contact provider to schedule follow up appointment.   Goals Addressed             This Visit's Progress    COMPLETED: Care coordination Activities       Interventions Today    Flowsheet Row Most Recent Value  General Interventions   General Interventions Discussed/Reviewed General Interventions Discussed, Doctor Visits  [brief introduction to care coordination program]  Doctor Visits Discussed/Reviewed PCP  [patient states his PCP is Dr. [name] and states he has to call to schedule appointment. reports has not seen due to all appointments due to kidney transplant.]           Health Management-education       Interventions Today    Flowsheet Row Most Recent Value  Chronic Disease   Chronic disease during today's visit Diabetes, Congestive Heart Failure (CHF), Hypertension (HTN), Other  [sarcoidosis, kidney transplant 07/18/2020]  General Interventions   General Interventions Discussed/Reviewed General Interventions Discussed, General Interventions Reviewed, [employer], Doctor Visits, [employer] --  [patient reports last A1C 7.4]  Doctor Visits Discussed/Reviewed Doctor Visits Reviewed, PCP   PCP/Specialist Visits Compliance with follow-up visit  [discussed importance of following up with PCP as well as specialist. patient to call to schedule visit with PCP]  Exercise Interventions   Exercise Discussed/Reviewed Exercise Discussed, Physical Activity  Physical Activity Discussed/Reviewed Physical Activity Discussed  [currently in pulmonoary rehab program at [facility]]  Education Interventions   Education Provided Provided Web-based Education, Provided Education  [diabetes type 2,  diabetes: nutrition and healthy eating,  dialy care for type 2 DM overview,  sarcoidosis article]  Provided Verbal Education On Blood Sugar Monitoring, When to see the doctor  [discussed target blood sugar range 80-130 fasting and <180 about 2 hrs after meal or per provider recommendations. encouraged to follow up with endocrinologist.]  Pharmacy Interventions   Pharmacy Dicussed/Reviewed Pharmacy Topics Discussed, Medications and their functions, Medication Adherence, Affording Medications            SDOH assessments and interventions completed:  Yes  SDOH Interventions Today    Flowsheet Row Most Recent Value  SDOH Interventions   Food Insecurity Interventions Intervention Not Indicated  Housing Interventions Intervention Not Indicated  Transportation Interventions Intervention Not Indicated  Utilities Interventions Intervention Not Indicated     Care Coordination Interventions:  Yes, provided   Follow up plan: Follow up call scheduled for 08/10/22    Encounter Outcome:  Pt. Visit Completed   Thea Silversmith, RN, MSN, BSN, CCM Care Management Coordinator [phone]

## 2022-07-12 NOTE — Patient Instructions (Signed)
Visit Information  Thank you for taking time to visit with me today. Please don't hesitate to contact me if I can be of assistance to you.   Following are the goals we discussed today:   Goals Addressed             This Visit's Progress    COMPLETED: Care coordination Activities       Interventions Today    Flowsheet Row Most Recent Value  General Interventions   General Interventions Discussed/Reviewed General Interventions Discussed, Doctor Visits  [brief introduction to care coordination program]  Doctor Visits Discussed/Reviewed PCP  [patient states his PCP is Dr. [name] and states he has to call to schedule appointment. reports has not seen due to all appointments due to kidney transplant.]           Health Management-education       Interventions Today    Flowsheet Row Most Recent Value  Chronic Disease   Chronic disease during today's visit Diabetes, Congestive Heart Failure (CHF), Hypertension (HTN), Other  [sarcoidosis, kidney transplant 07/18/2020]  General Interventions   General Interventions Discussed/Reviewed General Interventions Discussed, General Interventions Reviewed, [employer], Doctor Visits, [employer] --  [patient reports last A1C 7.4]  Doctor Visits Discussed/Reviewed Doctor Visits Reviewed, PCP  PCP/Specialist Visits Compliance with follow-up visit  [discussed importance of following up with PCP as well as specialist. patient to call to schedule visit with PCP]  Exercise Interventions   Exercise Discussed/Reviewed Exercise Discussed, Physical Activity  Physical Activity Discussed/Reviewed Physical Activity Discussed  [currently in pulmonoary rehab program at [facility]]  Education Interventions   Education Provided Provided Web-based Education, Provided Education  [diabetes type 2,  diabetes: nutrition and healthy eating,  dialy care for type 2 DM overview,  sarcoidosis article]  Provided Verbal Education On Blood Sugar Monitoring, When to  see the doctor  [discussed target blood sugar range 80-130 fasting and <180 about 2 hrs after meal or per provider recommendations. encouraged to follow up with endocrinologist.]  Pharmacy Interventions   Pharmacy Dicussed/Reviewed Pharmacy Topics Discussed, Medications and their functions, Medication Adherence, Affording Medications            Our next appointment is by telephone on 08/10/22 at 10:am  Please call the care guide team at [phone] if you need to cancel or reschedule your appointment.   If you are experiencing a Mental Health or [place] or need someone to talk to, please call the Suicide and Crisis Lifeline: [place], RN, MSN, BSN, [place] [phone]

## 2022-07-16 DIAGNOSIS — D849 Immunodeficiency, unspecified: Secondary | ICD-10-CM | POA: Diagnosis not present

## 2022-07-16 DIAGNOSIS — Z940 Kidney transplant status: Secondary | ICD-10-CM | POA: Diagnosis not present

## 2022-07-16 DIAGNOSIS — R0602 Shortness of breath: Secondary | ICD-10-CM | POA: Diagnosis not present

## 2022-07-16 DIAGNOSIS — Z794 Long term (current) use of insulin: Secondary | ICD-10-CM | POA: Diagnosis not present

## 2022-07-16 DIAGNOSIS — I10 Essential (primary) hypertension: Secondary | ICD-10-CM | POA: Diagnosis not present

## 2022-07-16 DIAGNOSIS — E1121 Type 2 diabetes mellitus with diabetic nephropathy: Secondary | ICD-10-CM | POA: Diagnosis not present

## 2022-07-18 DIAGNOSIS — D849 Immunodeficiency, unspecified: Secondary | ICD-10-CM | POA: Diagnosis not present

## 2022-07-18 DIAGNOSIS — Z794 Long term (current) use of insulin: Secondary | ICD-10-CM | POA: Diagnosis not present

## 2022-07-18 DIAGNOSIS — E1121 Type 2 diabetes mellitus with diabetic nephropathy: Secondary | ICD-10-CM | POA: Diagnosis not present

## 2022-07-18 DIAGNOSIS — R0602 Shortness of breath: Secondary | ICD-10-CM | POA: Diagnosis not present

## 2022-07-18 DIAGNOSIS — Z940 Kidney transplant status: Secondary | ICD-10-CM | POA: Diagnosis not present

## 2022-07-18 DIAGNOSIS — I10 Essential (primary) hypertension: Secondary | ICD-10-CM | POA: Diagnosis not present

## 2022-07-23 DIAGNOSIS — E1121 Type 2 diabetes mellitus with diabetic nephropathy: Secondary | ICD-10-CM | POA: Diagnosis not present

## 2022-07-23 DIAGNOSIS — Z940 Kidney transplant status: Secondary | ICD-10-CM | POA: Diagnosis not present

## 2022-07-23 DIAGNOSIS — Z794 Long term (current) use of insulin: Secondary | ICD-10-CM | POA: Diagnosis not present

## 2022-07-23 DIAGNOSIS — D849 Immunodeficiency, unspecified: Secondary | ICD-10-CM | POA: Diagnosis not present

## 2022-07-23 DIAGNOSIS — R0602 Shortness of breath: Secondary | ICD-10-CM | POA: Diagnosis not present

## 2022-07-23 DIAGNOSIS — I10 Essential (primary) hypertension: Secondary | ICD-10-CM | POA: Diagnosis not present

## 2022-07-25 DIAGNOSIS — E1121 Type 2 diabetes mellitus with diabetic nephropathy: Secondary | ICD-10-CM | POA: Diagnosis not present

## 2022-07-25 DIAGNOSIS — Z794 Long term (current) use of insulin: Secondary | ICD-10-CM | POA: Diagnosis not present

## 2022-07-25 DIAGNOSIS — D849 Immunodeficiency, unspecified: Secondary | ICD-10-CM | POA: Diagnosis not present

## 2022-07-25 DIAGNOSIS — R0602 Shortness of breath: Secondary | ICD-10-CM | POA: Diagnosis not present

## 2022-07-25 DIAGNOSIS — I10 Essential (primary) hypertension: Secondary | ICD-10-CM | POA: Diagnosis not present

## 2022-07-25 DIAGNOSIS — Z940 Kidney transplant status: Secondary | ICD-10-CM | POA: Diagnosis not present

## 2022-07-30 DIAGNOSIS — Z940 Kidney transplant status: Secondary | ICD-10-CM | POA: Diagnosis not present

## 2022-07-30 DIAGNOSIS — D849 Immunodeficiency, unspecified: Secondary | ICD-10-CM | POA: Diagnosis not present

## 2022-07-30 DIAGNOSIS — E1121 Type 2 diabetes mellitus with diabetic nephropathy: Secondary | ICD-10-CM | POA: Diagnosis not present

## 2022-07-30 DIAGNOSIS — R0602 Shortness of breath: Secondary | ICD-10-CM | POA: Diagnosis not present

## 2022-07-30 DIAGNOSIS — Z794 Long term (current) use of insulin: Secondary | ICD-10-CM | POA: Diagnosis not present

## 2022-07-30 DIAGNOSIS — I10 Essential (primary) hypertension: Secondary | ICD-10-CM | POA: Diagnosis not present

## 2022-08-06 ENCOUNTER — Encounter: Payer: Self-pay | Admitting: Internal Medicine

## 2022-08-06 ENCOUNTER — Ambulatory Visit (INDEPENDENT_AMBULATORY_CARE_PROVIDER_SITE_OTHER): Payer: 59 | Admitting: Internal Medicine

## 2022-08-06 VITALS — BP 138/80 | HR 76 | Temp 98.2°F | Ht 70.0 in | Wt 166.0 lb

## 2022-08-06 DIAGNOSIS — E785 Hyperlipidemia, unspecified: Secondary | ICD-10-CM | POA: Diagnosis not present

## 2022-08-06 DIAGNOSIS — Z0001 Encounter for general adult medical examination with abnormal findings: Secondary | ICD-10-CM

## 2022-08-06 DIAGNOSIS — Z794 Long term (current) use of insulin: Secondary | ICD-10-CM | POA: Diagnosis not present

## 2022-08-06 DIAGNOSIS — D869 Sarcoidosis, unspecified: Secondary | ICD-10-CM | POA: Diagnosis not present

## 2022-08-06 DIAGNOSIS — I10 Essential (primary) hypertension: Secondary | ICD-10-CM

## 2022-08-06 DIAGNOSIS — R0602 Shortness of breath: Secondary | ICD-10-CM | POA: Diagnosis not present

## 2022-08-06 DIAGNOSIS — I5022 Chronic systolic (congestive) heart failure: Secondary | ICD-10-CM

## 2022-08-06 DIAGNOSIS — D849 Immunodeficiency, unspecified: Secondary | ICD-10-CM | POA: Diagnosis not present

## 2022-08-06 DIAGNOSIS — E1169 Type 2 diabetes mellitus with other specified complication: Secondary | ICD-10-CM | POA: Diagnosis not present

## 2022-08-06 DIAGNOSIS — I739 Peripheral vascular disease, unspecified: Secondary | ICD-10-CM

## 2022-08-06 DIAGNOSIS — N184 Chronic kidney disease, stage 4 (severe): Secondary | ICD-10-CM | POA: Diagnosis not present

## 2022-08-06 DIAGNOSIS — Z940 Kidney transplant status: Secondary | ICD-10-CM | POA: Diagnosis not present

## 2022-08-06 DIAGNOSIS — E1122 Type 2 diabetes mellitus with diabetic chronic kidney disease: Secondary | ICD-10-CM | POA: Diagnosis not present

## 2022-08-06 DIAGNOSIS — E1121 Type 2 diabetes mellitus with diabetic nephropathy: Secondary | ICD-10-CM

## 2022-08-06 NOTE — Progress Notes (Signed)
Subjective:   Patient ID: Brandon Carroll, male    DOB: 11-10-1960, 62 y.o.   MRN: [id number]  HPI Here for medicare wellness and physical, no new complaints. Please see A/P for status and treatment of chronic medical problems.   Diet:  DM since diabetic Physical activity: sedentary doing cardiopulmonary rehab currently Depression/mood screen: negative Hearing: intact to whispered voice Visual acuity: grossly normal, performs annual eye exam  ADLs: capable Fall risk: none Home safety: good Cognitive evaluation: intact to orientation, naming, recall and repetition EOL planning: adv directives discussed  [place] Visit from 08/06/2022 in [place] at [facility]  PHQ-2 Total Score 1       [place] Office Visit from 08/06/2022 in [place] at [facility]  PHQ-9 Total Score 1         05/26/2018   11:00 AM 05/26/2018   11:08 PM 05/27/2018    8:00 AM 04/28/2019    6:38 AM 08/06/2022    1:31 PM  [place] in the past year?     0  Was there an injury with Fall?     0  Fall Risk Category Calculator     0  (RETIRED) Patient Fall Risk Level Low fall risk Low fall risk Moderate fall risk Moderate fall risk     I have personally reviewed and have noted 1. The patient's medical and social history - reviewed today no changes 2. Their use of alcohol, tobacco or illicit drugs 3. Their current medications and supplements 4. The patient's functional ability including ADL's, fall risks, home safety risks and hearing or visual impairment. 5. Diet and physical activities 6. Evidence for depression or mood disorders 7. Care team reviewed and updated 8.  The patient is not on an opioid pain medication.  Patient Care Team: Hoyt Koch, MD as PCP - General (Internal Medicine) Noralee Space, MD as Consulting Physician (Pulmonary Disease) Festus Aloe, MD as Consulting Physician (Urology) Skeet Latch,  MD as Attending Physician (Cardiology) Renato Shin, MD (Inactive) as Consulting Physician (Endocrinology) Madelon Lips, MD as Consulting Physician (Nephrology) Past Medical History:  Diagnosis Date   A-V fistula ([place]) 03/12/2019   Abnormal CT of the chest 06/08/2015   Abnormal echocardiogram 123456   Acute systolic CHF (congestive heart failure), NYHA class 2 ([place]) 05/04/2015   Acute-on-chronic kidney injury ([place]) 05/04/2015   Anemia 06/06/2015   Anemia in chronic kidney disease 11/18/2018   Body mass index (BMI) 27.0-27.9, adult 123456   Chronic systolic CHF (congestive heart failure) ([place]) 06/15/2015   Coronary artery disease 02/17/2018   Cardiac catheterization 2017 showing 10% LAD and 10% RCA Echocardiogram and stress test done in December 2019 showed evidence of 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  Anticipate improvement as glucose control improves.    Hypertension    Iron deficiency anemia, unspecified 11/03/2018   Low testosterone 10/04/2015   12/01/2015 visit with Festus Aloe, MD at [facility].    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   Secondary hyperparathyroidism ([place]) 10/20/2018   Past Surgical History:  Procedure Laterality Date   AV FISTULA  PLACEMENT Left 05/26/2018   Procedure: ARTERIOVENOUS (AV) FISTULA CREATION LEFT ARM;  Surgeon: Marty Heck, MD;  Location: [place];  Service: Vascular;  Laterality: Left;   CARDIAC CATHETERIZATION N/A 06/15/2015   Procedure: Left Heart Cath and Coronary Angiography;  Surgeon: Peter M Martinique, MD;  Location: [place] CV [facility];  Service: Cardiovascular;  Laterality: N/A;   CHOLECYSTECTOMY N/A 06/28/2017   Procedure: LAPAROSCOPIC CHOLECYSTECTOMY;  Surgeon: Ralene Ok, MD;  Location: [facility];  Service: General;  Laterality: N/A;   HERNIA REPAIR  7th grade   umbilical   LEFT HEART CATH AND CORONARY ANGIOGRAPHY N/A 04/28/2019   Procedure: LEFT HEART CATH AND CORONARY ANGIOGRAPHY;  Surgeon: Leonie Man, MD;  Location: [place] [facility];  Service: Cardiovascular;  Laterality: N/A;   Left Kidney Transplant  07/12/2020   Family History  Problem Relation Age of Onset   Arthritis Mother    COPD Mother    Diabetes Mother        was thin, took insulin   Hypertension Mother    Hypertension Father    Arthritis Sister        rheumatoid   COPD Sister    Diabetes Brother    Colon cancer Neg Hx    Review of Systems  Constitutional: Negative.   HENT: Negative.    Eyes: Negative.   Respiratory:  Positive for shortness of breath. Negative for cough and chest tightness.   Cardiovascular:  Negative for chest pain, palpitations and leg swelling.  Gastrointestinal:  Negative for abdominal distention, abdominal pain, constipation, diarrhea, nausea and vomiting.  Musculoskeletal: Negative.   Skin: Negative.   Neurological: Negative.   Psychiatric/Behavioral: Negative.      Objective:  Physical Exam Constitutional:      Appearance: He is well-developed.  HENT:     Head: Normocephalic and atraumatic.  Cardiovascular:     Rate and Rhythm: Normal rate and regular rhythm.  Pulmonary:     Effort: Pulmonary effort is normal. No respiratory distress.     Breath sounds: Normal breath sounds.  No wheezing or rales.  Abdominal:     General: Bowel sounds are normal. There is no distension.     Palpations: Abdomen is soft.     Tenderness: There is no abdominal tenderness. There is no rebound.  Musculoskeletal:     Cervical back: Normal range of motion.  Skin:    General: Skin is warm and dry.  Neurological:     Mental Status: He is alert and oriented to person, place, and time.     Coordination: Coordination normal.     Vitals:   08/06/22 1326  BP: 138/80  Pulse: 76  Temp: 98.2 F (36.8 C)  TempSrc: Oral  SpO2: 97%  Weight: 166 lb (75.3 kg)  Height: 5\' 10"  (1.778 m)   Assessment & Plan:

## 2022-08-07 NOTE — Assessment & Plan Note (Signed)
HGA1c at transplant center at goal. He is taking jardiance 10 mg daily and lispro 10 units with meals and tresiba 38 units daily. This is keeping sugars at goal without hypoglycemia. Will keep current regimen.

## 2022-08-07 NOTE — Assessment & Plan Note (Signed)
Flu shot yearly. Pneumonia up to date. Shingrix not completed he wishes to ask transplant provider. Tetanus due at pharmacy. Colonoscopy due 2026. Counseled about sun safety and mole surveillance. Counseled about the dangers of distracted driving. Given 10 year screening recommendations.

## 2022-08-07 NOTE — Assessment & Plan Note (Signed)
BP at goal on coreg 6.25 mg BID and hydralazine 25 mg TID and imdur 120 mg daily. Recent labs stable will continue.

## 2022-08-07 NOTE — Assessment & Plan Note (Signed)
Continues on prednisone and mycophenolate and overall stable. 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 Recent creatinine levels stable.

## 2022-08-07 NOTE — Assessment & Plan Note (Signed)
Overall stable and uses albuterol prn. No flare today.

## 2022-08-07 NOTE — Assessment & Plan Note (Signed)
No flare today and taking coreg 6.25 mg BID and imdur 120 mg daily. No chest pains. Taking aspirin. Not on ACE-I/ARB due to renal dysfunction.

## 2022-08-07 NOTE — Assessment & Plan Note (Signed)
Lipids at goal from transplant center has allergy to statins.

## 2022-08-10 ENCOUNTER — Telehealth: Payer: Self-pay

## 2022-08-10 NOTE — Patient Outreach (Signed)
  Care Coordination   08/10/2022 Name: Gambit Golia MRN: [id number] DOB: [birth year]   Care Coordination Outreach Attempts:  An unsuccessful telephone outreach was attempted today to offer the patient information about available care coordination services as a benefit of their health plan.   Follow Up Plan:  Additional outreach attempts will be made to offer the patient care coordination information and services.   Encounter Outcome:  No Answer   Care Coordination Interventions:  No, not indicated    Thea Silversmith, RN, MSN, BSN, [place] Coordinator [phone]

## 2022-08-13 DIAGNOSIS — D849 Immunodeficiency, unspecified: Secondary | ICD-10-CM | POA: Diagnosis not present

## 2022-08-13 DIAGNOSIS — I10 Essential (primary) hypertension: Secondary | ICD-10-CM | POA: Diagnosis not present

## 2022-08-13 DIAGNOSIS — Z940 Kidney transplant status: Secondary | ICD-10-CM | POA: Diagnosis not present

## 2022-08-13 DIAGNOSIS — E1121 Type 2 diabetes mellitus with diabetic nephropathy: Secondary | ICD-10-CM | POA: Diagnosis not present

## 2022-08-13 DIAGNOSIS — Z794 Long term (current) use of insulin: Secondary | ICD-10-CM | POA: Diagnosis not present

## 2022-08-13 DIAGNOSIS — R0602 Shortness of breath: Secondary | ICD-10-CM | POA: Diagnosis not present

## 2022-08-14 DIAGNOSIS — D869 Sarcoidosis, unspecified: Secondary | ICD-10-CM | POA: Diagnosis not present

## 2022-08-14 DIAGNOSIS — E782 Mixed hyperlipidemia: Secondary | ICD-10-CM | POA: Diagnosis not present

## 2022-08-14 DIAGNOSIS — I5032 Chronic diastolic (congestive) heart failure: Secondary | ICD-10-CM | POA: Diagnosis not present

## 2022-08-15 DIAGNOSIS — I10 Essential (primary) hypertension: Secondary | ICD-10-CM | POA: Diagnosis not present

## 2022-08-15 DIAGNOSIS — D849 Immunodeficiency, unspecified: Secondary | ICD-10-CM | POA: Diagnosis not present

## 2022-08-15 DIAGNOSIS — Z794 Long term (current) use of insulin: Secondary | ICD-10-CM | POA: Diagnosis not present

## 2022-08-15 DIAGNOSIS — Z940 Kidney transplant status: Secondary | ICD-10-CM | POA: Diagnosis not present

## 2022-08-15 DIAGNOSIS — E1121 Type 2 diabetes mellitus with diabetic nephropathy: Secondary | ICD-10-CM | POA: Diagnosis not present

## 2022-08-15 DIAGNOSIS — R0602 Shortness of breath: Secondary | ICD-10-CM | POA: Diagnosis not present

## 2022-08-16 DIAGNOSIS — I499 Cardiac arrhythmia, unspecified: Secondary | ICD-10-CM | POA: Diagnosis not present

## 2022-08-16 DIAGNOSIS — I444 Left anterior fascicular block: Secondary | ICD-10-CM | POA: Diagnosis not present

## 2022-08-17 ENCOUNTER — Telehealth: Payer: Self-pay | Admitting: *Deleted

## 2022-08-17 NOTE — Progress Notes (Signed)
  Care Coordination Note  08/17/2022 Name: Brandon Carroll MRN: [id number] DOB: Mar 07, 1961  Brandon Carroll is a 62 y.o. year old male who is a primary care patient of Myrlene Broker, MD and is actively engaged with the care management team. I reached out to [employer] by phone today to assist with re-scheduling a follow up visit with the RN Case Manager  Follow up plan: Unsuccessful telephone outreach attempt made. A HIPAA compliant phone message was left for the patient providing contact information and requesting a return call.   Burman Nieves, CCMA Care Coordination Care Guide Direct Dial: [phone]

## 2022-08-20 DIAGNOSIS — Z794 Long term (current) use of insulin: Secondary | ICD-10-CM | POA: Diagnosis not present

## 2022-08-20 DIAGNOSIS — Z940 Kidney transplant status: Secondary | ICD-10-CM | POA: Diagnosis not present

## 2022-08-20 DIAGNOSIS — D849 Immunodeficiency, unspecified: Secondary | ICD-10-CM | POA: Diagnosis not present

## 2022-08-20 DIAGNOSIS — I10 Essential (primary) hypertension: Secondary | ICD-10-CM | POA: Diagnosis not present

## 2022-08-20 DIAGNOSIS — R0602 Shortness of breath: Secondary | ICD-10-CM | POA: Diagnosis not present

## 2022-08-20 DIAGNOSIS — E1121 Type 2 diabetes mellitus with diabetic nephropathy: Secondary | ICD-10-CM | POA: Diagnosis not present

## 2022-08-21 DIAGNOSIS — D869 Sarcoidosis, unspecified: Secondary | ICD-10-CM | POA: Diagnosis not present

## 2022-08-21 DIAGNOSIS — Z8619 Personal history of other infectious and parasitic diseases: Secondary | ICD-10-CM | POA: Diagnosis not present

## 2022-08-21 DIAGNOSIS — E1129 Type 2 diabetes mellitus with other diabetic kidney complication: Secondary | ICD-10-CM | POA: Diagnosis not present

## 2022-08-21 DIAGNOSIS — I5022 Chronic systolic (congestive) heart failure: Secondary | ICD-10-CM | POA: Diagnosis not present

## 2022-08-21 DIAGNOSIS — N189 Chronic kidney disease, unspecified: Secondary | ICD-10-CM | POA: Diagnosis not present

## 2022-08-21 DIAGNOSIS — Z940 Kidney transplant status: Secondary | ICD-10-CM | POA: Diagnosis not present

## 2022-08-22 DIAGNOSIS — Z940 Kidney transplant status: Secondary | ICD-10-CM | POA: Diagnosis not present

## 2022-08-22 DIAGNOSIS — R0602 Shortness of breath: Secondary | ICD-10-CM | POA: Diagnosis not present

## 2022-08-22 DIAGNOSIS — E1121 Type 2 diabetes mellitus with diabetic nephropathy: Secondary | ICD-10-CM | POA: Diagnosis not present

## 2022-08-22 DIAGNOSIS — D849 Immunodeficiency, unspecified: Secondary | ICD-10-CM | POA: Diagnosis not present

## 2022-08-22 DIAGNOSIS — Z794 Long term (current) use of insulin: Secondary | ICD-10-CM | POA: Diagnosis not present

## 2022-08-22 DIAGNOSIS — I10 Essential (primary) hypertension: Secondary | ICD-10-CM | POA: Diagnosis not present

## 2022-08-25 ENCOUNTER — Other Ambulatory Visit: Payer: Self-pay | Admitting: Cardiology

## 2022-08-27 DIAGNOSIS — Z794 Long term (current) use of insulin: Secondary | ICD-10-CM | POA: Diagnosis not present

## 2022-08-27 DIAGNOSIS — Z940 Kidney transplant status: Secondary | ICD-10-CM | POA: Diagnosis not present

## 2022-08-27 DIAGNOSIS — D849 Immunodeficiency, unspecified: Secondary | ICD-10-CM | POA: Diagnosis not present

## 2022-08-27 DIAGNOSIS — R0602 Shortness of breath: Secondary | ICD-10-CM | POA: Diagnosis not present

## 2022-08-27 DIAGNOSIS — I10 Essential (primary) hypertension: Secondary | ICD-10-CM | POA: Diagnosis not present

## 2022-08-27 DIAGNOSIS — E1121 Type 2 diabetes mellitus with diabetic nephropathy: Secondary | ICD-10-CM | POA: Diagnosis not present

## 2022-08-27 NOTE — Telephone Encounter (Signed)
Rx refill sent to pharmacy. 

## 2022-08-29 ENCOUNTER — Other Ambulatory Visit: Payer: 59

## 2022-08-29 DIAGNOSIS — H40013 Open angle with borderline findings, low risk, bilateral: Secondary | ICD-10-CM | POA: Diagnosis not present

## 2022-08-29 DIAGNOSIS — H25813 Combined forms of age-related cataract, bilateral: Secondary | ICD-10-CM | POA: Diagnosis not present

## 2022-08-29 DIAGNOSIS — H40033 Anatomical narrow angle, bilateral: Secondary | ICD-10-CM | POA: Diagnosis not present

## 2022-08-29 DIAGNOSIS — E113293 Type 2 diabetes mellitus with mild nonproliferative diabetic retinopathy without macular edema, bilateral: Secondary | ICD-10-CM | POA: Diagnosis not present

## 2022-08-30 ENCOUNTER — Ambulatory Visit: Payer: 59 | Admitting: Endocrinology

## 2022-08-30 NOTE — Progress Notes (Signed)
  Care Coordination Note  08/30/2022 Name: Kirklyn Cuoco MRN: [id number] DOB: September 11, 1960  Cyan Drakos is a 62 y.o. year old male who is a primary care patient of Myrlene Broker, MD and is actively engaged with the care management team. I reached out to [employer] by phone today to assist with re-scheduling a follow up visit with the RN Case Manager  Follow up plan: We have been unable to make contact with the patient for follow up.   Burman Nieves, CCMA Care Coordination Care Guide Direct Dial: [phone]

## 2022-09-03 DIAGNOSIS — E1121 Type 2 diabetes mellitus with diabetic nephropathy: Secondary | ICD-10-CM | POA: Diagnosis not present

## 2022-09-03 DIAGNOSIS — D849 Immunodeficiency, unspecified: Secondary | ICD-10-CM | POA: Diagnosis not present

## 2022-09-03 DIAGNOSIS — R0602 Shortness of breath: Secondary | ICD-10-CM | POA: Diagnosis not present

## 2022-09-03 DIAGNOSIS — Z794 Long term (current) use of insulin: Secondary | ICD-10-CM | POA: Diagnosis not present

## 2022-09-03 DIAGNOSIS — I10 Essential (primary) hypertension: Secondary | ICD-10-CM | POA: Diagnosis not present

## 2022-09-03 DIAGNOSIS — Z940 Kidney transplant status: Secondary | ICD-10-CM | POA: Diagnosis not present

## 2022-09-05 DIAGNOSIS — E1121 Type 2 diabetes mellitus with diabetic nephropathy: Secondary | ICD-10-CM | POA: Diagnosis not present

## 2022-09-05 DIAGNOSIS — R0602 Shortness of breath: Secondary | ICD-10-CM | POA: Diagnosis not present

## 2022-09-05 DIAGNOSIS — D849 Immunodeficiency, unspecified: Secondary | ICD-10-CM | POA: Diagnosis not present

## 2022-09-05 DIAGNOSIS — Z794 Long term (current) use of insulin: Secondary | ICD-10-CM | POA: Diagnosis not present

## 2022-09-05 DIAGNOSIS — Z940 Kidney transplant status: Secondary | ICD-10-CM | POA: Diagnosis not present

## 2022-09-05 DIAGNOSIS — I10 Essential (primary) hypertension: Secondary | ICD-10-CM | POA: Diagnosis not present

## 2022-09-07 ENCOUNTER — Encounter: Payer: Self-pay | Admitting: Internal Medicine

## 2022-09-07 ENCOUNTER — Ambulatory Visit (INDEPENDENT_AMBULATORY_CARE_PROVIDER_SITE_OTHER): Payer: 59 | Admitting: Internal Medicine

## 2022-09-07 VITALS — BP 124/78 | HR 69 | Temp 97.8°F | Ht 70.0 in | Wt 168.0 lb

## 2022-09-07 DIAGNOSIS — N5201 Erectile dysfunction due to arterial insufficiency: Secondary | ICD-10-CM | POA: Diagnosis not present

## 2022-09-07 NOTE — Assessment & Plan Note (Signed)
Due to current imdur he is unable to take viagra or cialis oral. We talked about pump or implant as options and he has a urologist he will discuss with.

## 2022-09-07 NOTE — Progress Notes (Signed)
   Subjective:   Patient ID: Brandon Carroll, male    DOB: 1960-12-01, 62 y.o.   MRN: [id number]  HPI The patient is a 62 YO man coming in for ED   Review of Systems  Constitutional: Negative.   HENT: Negative.    Eyes: Negative.   Respiratory:  Negative for cough, chest tightness and shortness of breath.   Cardiovascular:  Negative for chest pain, palpitations and leg swelling.  Gastrointestinal:  Negative for abdominal distention, abdominal pain, constipation, diarrhea, nausea and vomiting.  Genitourinary:        ED  Musculoskeletal: Negative.   Skin: Negative.   Neurological: Negative.   Psychiatric/Behavioral: Negative.      Objective:  Physical Exam Constitutional:      Appearance: He is well-developed.  HENT:     Head: Normocephalic and atraumatic.  Cardiovascular:     Rate and Rhythm: Normal rate and regular rhythm.  Pulmonary:     Effort: Pulmonary effort is normal. No respiratory distress.     Breath sounds: Normal breath sounds. No wheezing or rales.  Abdominal:     General: Bowel sounds are normal. There is no distension.     Palpations: Abdomen is soft.     Tenderness: There is no abdominal tenderness. There is no rebound.  Musculoskeletal:     Cervical back: Normal range of motion.  Skin:    General: Skin is warm and dry.  Neurological:     Mental Status: He is alert and oriented to person, place, and time.     Coordination: Coordination normal.     Vitals:   09/07/22 0842  BP: 124/78  Pulse: 69  Temp: 97.8 F (36.6 C)  TempSrc: Oral  SpO2: 98%  Weight: 168 lb (76.2 kg)  Height: 5\' 10"  (1.778 m)    Assessment & Plan:

## 2022-09-10 DIAGNOSIS — Z794 Long term (current) use of insulin: Secondary | ICD-10-CM | POA: Diagnosis not present

## 2022-09-10 DIAGNOSIS — Z940 Kidney transplant status: Secondary | ICD-10-CM | POA: Diagnosis not present

## 2022-09-10 DIAGNOSIS — R0602 Shortness of breath: Secondary | ICD-10-CM | POA: Diagnosis not present

## 2022-09-10 DIAGNOSIS — E1121 Type 2 diabetes mellitus with diabetic nephropathy: Secondary | ICD-10-CM | POA: Diagnosis not present

## 2022-09-10 DIAGNOSIS — I10 Essential (primary) hypertension: Secondary | ICD-10-CM | POA: Diagnosis not present

## 2022-09-10 DIAGNOSIS — D849 Immunodeficiency, unspecified: Secondary | ICD-10-CM | POA: Diagnosis not present

## 2022-09-19 DIAGNOSIS — E1121 Type 2 diabetes mellitus with diabetic nephropathy: Secondary | ICD-10-CM | POA: Diagnosis not present

## 2022-09-19 DIAGNOSIS — Z794 Long term (current) use of insulin: Secondary | ICD-10-CM | POA: Diagnosis not present

## 2022-10-03 DIAGNOSIS — E1121 Type 2 diabetes mellitus with diabetic nephropathy: Secondary | ICD-10-CM | POA: Diagnosis not present

## 2022-10-17 DIAGNOSIS — E1121 Type 2 diabetes mellitus with diabetic nephropathy: Secondary | ICD-10-CM | POA: Diagnosis not present

## 2022-11-07 ENCOUNTER — Ambulatory Visit (INDEPENDENT_AMBULATORY_CARE_PROVIDER_SITE_OTHER): Payer: 59 | Admitting: Podiatry

## 2022-11-07 DIAGNOSIS — Z91199 Patient's noncompliance with other medical treatment and regimen due to unspecified reason: Secondary | ICD-10-CM

## 2022-11-11 NOTE — Progress Notes (Signed)
1. No-show for appointment     

## 2022-11-13 DIAGNOSIS — Z940 Kidney transplant status: Secondary | ICD-10-CM | POA: Diagnosis not present

## 2022-11-13 DIAGNOSIS — N390 Urinary tract infection, site not specified: Secondary | ICD-10-CM | POA: Diagnosis not present

## 2022-11-13 DIAGNOSIS — E1129 Type 2 diabetes mellitus with other diabetic kidney complication: Secondary | ICD-10-CM | POA: Diagnosis not present

## 2022-11-19 DIAGNOSIS — E1121 Type 2 diabetes mellitus with diabetic nephropathy: Secondary | ICD-10-CM | POA: Diagnosis not present

## 2022-11-29 DIAGNOSIS — Z8619 Personal history of other infectious and parasitic diseases: Secondary | ICD-10-CM | POA: Diagnosis not present

## 2022-11-29 DIAGNOSIS — E1129 Type 2 diabetes mellitus with other diabetic kidney complication: Secondary | ICD-10-CM | POA: Diagnosis not present

## 2022-11-29 DIAGNOSIS — I5022 Chronic systolic (congestive) heart failure: Secondary | ICD-10-CM | POA: Diagnosis not present

## 2022-11-29 DIAGNOSIS — Z940 Kidney transplant status: Secondary | ICD-10-CM | POA: Diagnosis not present

## 2022-11-29 DIAGNOSIS — D869 Sarcoidosis, unspecified: Secondary | ICD-10-CM | POA: Diagnosis not present

## 2022-11-30 DIAGNOSIS — Z940 Kidney transplant status: Secondary | ICD-10-CM | POA: Diagnosis not present

## 2022-12-05 ENCOUNTER — Ambulatory Visit: Payer: 59

## 2022-12-05 ENCOUNTER — Ambulatory Visit (INDEPENDENT_AMBULATORY_CARE_PROVIDER_SITE_OTHER): Payer: 59 | Admitting: Primary Care

## 2022-12-05 ENCOUNTER — Encounter: Payer: Self-pay | Admitting: Primary Care

## 2022-12-05 VITALS — BP 126/70 | HR 64 | Temp 98.2°F | Ht 69.0 in | Wt 171.6 lb

## 2022-12-05 DIAGNOSIS — D869 Sarcoidosis, unspecified: Secondary | ICD-10-CM | POA: Diagnosis not present

## 2022-12-05 DIAGNOSIS — R0609 Other forms of dyspnea: Secondary | ICD-10-CM | POA: Diagnosis not present

## 2022-12-05 DIAGNOSIS — R0600 Dyspnea, unspecified: Secondary | ICD-10-CM | POA: Diagnosis not present

## 2022-12-05 LAB — BASIC METABOLIC PANEL
BUN: 26 mg/dL — ABNORMAL HIGH (ref 6–23)
CO2: 24 mEq/L (ref 19–32)
Calcium: 9.7 mg/dL (ref 8.4–10.5)
Chloride: 104 mEq/L (ref 96–112)
Creatinine, Ser: 1.82 mg/dL — ABNORMAL HIGH (ref 0.40–1.50)
GFR: 40.14 mL/min — ABNORMAL LOW (ref >60.00)
Glucose, Bld: 142 mg/dL — ABNORMAL HIGH (ref 70–99)
Potassium: 4.4 mEq/L (ref 3.5–5.1)
Sodium: 137 mEq/L (ref 135–145)

## 2022-12-05 LAB — BRAIN NATRIURETIC PEPTIDE: Pro B Natriuretic peptide (BNP): 854 pg/mL — ABNORMAL HIGH (ref 0.0–100.0)

## 2022-12-05 NOTE — Patient Instructions (Addendum)
No changes to medications today Will check chest x-ray and labs to monitor sarcoidosis  Orders: CXR and labs re: dyspnea/ hx sarcoid  Follow-up: 6 months with Dr. Vassie Loll    Sarcoidosis  Sarcoidosis is a disease that can cause inflammation in many areas of the body. It most often affects the lungs (pulmonary sarcoidosis). Sarcoidosis can also affect the lymph nodes, liver, eyes, skin, heart, or any other body tissue. Normally, cells that are part of the body's disease-fighting system (immune system) attack harmful substances in the body, such as germs. This immune system response causes inflammation. After the harmful substance is destroyed, the inflammation goes away. When you have sarcoidosis, your immune system causes inflammation even when there are no harmful substances, and the inflammation does not go away. Sarcoidosis also causes cells from your immune system to form small lumps (granulomas) in the affected area of your body. What are the causes? The exact cause of sarcoidosis is not known.  If you have a family history of this disease (genetic predisposition), the immune system response that leads to inflammation may be triggered by something in your environment, such as: Bacteria or viruses. Metals. Chemicals. Dust. Mold or mildew. What increases the risk? You may be more likely to develop this condition if you: Have a family history of the disease. Are African American. Are of Northern European descent. Are 67-87 years old. Are male. Work as a IT sales professional. Work in an environment where you are exposed to metals, chemicals, mold or mildew, or insecticides. What are the signs or symptoms? Some people with sarcoidosis have no symptoms. Others have very mild symptoms. The symptoms usually depend on the organ that is affected. Sarcoidosis most often affects the lungs, which may lead to symptoms such as: Chest pain. Coughing. Wheezing. Shortness of breath. Other common  symptoms include: Night sweats. Fever. Weight loss. Tiredness (fatigue). Swollen lymph nodes. Joint pain. How is this diagnosed? This condition may be diagnosed based on: Your symptoms and medical history. A physical exam. Imaging tests such as: Chest X-ray. CT scan. MRI. PET scan. Lung function tests. These tests evaluate your breathing and check for problems that may be related to sarcoidosis. A procedure to remove a tissue sample for testing (biopsy). You may have a biopsy of lung tissue if that is where you are having symptoms. You may have tests to check for any complications of the condition. These tests may include: Eye exams. MRI of the heart or brain. Echocardiogram. ECG (electrocardiogram). How is this treated? In some cases, sarcoidosis does not require a specific treatment because it causes no symptoms or only mild symptoms. If your symptoms bother you or are severe, you may be prescribed medicines to reduce inflammation or relieve symptoms. These medicines may include: Prednisone. This is a steroid that reduces inflammation related to sarcoidosis. Hydroxychloroquine. This may be used to treat sarcoidosis that affects the skin, eyes, or brain. Certain medicines that affect the immune system. These can help with sarcoidosis in the joints, eyes, skin, or lungs. Medicines that you breathe in (inhalers). Inhalers can help you breathe if sarcoidosis affects your lungs. Follow these instructions at home:  Do not use any products that contain nicotine or tobacco. These products include cigarettes, chewing tobacco, and vaping devices, such as e-cigarettes. If you need help quitting, ask your health care provider. Avoid secondhand smoke and irritating dust or chemicals. Stay indoors on days when air quality is poor in your area. Return to your normal activities as told by your  health care provider. Ask your health care provider what activities are safe for you. Take or use  over-the-counter and prescription medicines only as told by your health care provider. Keep all follow-up visits. This is important. Where to find more information National Heart, Lung, and Blood Institute: [employer] Contact a health care provider if: You have vision problems. You have a dry cough that does not go away. You have an irregular heartbeat. You have pain or aches in your joints, hands, or feet. You have an unexplained rash. Get help right away if: You have chest pain. You have trouble breathing. These symptoms may represent a serious problem that is an emergency. Do not wait to see if the symptoms will go away. Get medical help right away. Call your local emergency services (911 in the [place] Do not drive yourself to the hospital. Summary Sarcoidosis is a disease that can cause inflammation in many body areas of the body. It most often affects the lungs (pulmonary sarcoidosis). It can also affect the lymph nodes, liver, eyes, skin, heart, or any other body tissue. When you have sarcoidosis, cells from your immune system form small lumps (granulomas) in the affected area of your body. Sarcoidosis sometimes does not require a specific treatment because it causes no symptoms or only mild symptoms. If your symptoms bother you or are severe, you may be prescribed medicines to reduce inflammation or relieve symptoms. This information is not intended to replace advice given to you by your health care provider. Make sure you discuss any questions you have with your health care provider. Document Revised: 03/01/2020 Document Reviewed: 03/01/2020 Elsevier Patient Education  2024 [employer].

## 2022-12-05 NOTE — Assessment & Plan Note (Addendum)
-   Presumed diagnosis based on mediastinal lymphadenopathy, nodularity and elevated ACE level. Never biopsied.  Patient presents today with increased shortness of breath and cough over the last 2 to 3 weeks. Respiratory exam was benign, lungs clear to auscultation.  We will get updated chest imaging and ACE level to monitor sarcoidosis.  Also check BNP due to history of grade 1 diastolic dysfunction. No changes to medications today. [place] in 6 months for routine visit with Dr. Vassie Loll.

## 2022-12-05 NOTE — Progress Notes (Signed)
@Patient  ID: Brandon Carroll, male    DOB: Sep 16, 1960, 62 y.o.   MRN: [id number]  Chief Complaint  Patient presents with   Follow-up    Yearly OV.      Referring provider: Myrlene Broker, *  HPI: 62 year old male, never smoked.  Past medical history significant for CHF, coronary artery disease, hypertension, type 2 diabetes, hyperlipidemia, sarcoidosis, chronic kidney disease, renal vein stenosis of kidney transplant.   12/05/2022- Interim hx Patient presents today for overdue follow-up. He was last seen on July 24, 2021 by Dr. Vassie Loll for dyspnea on exertion.  Patient has known history of sarcoidosis. Hx kidney transplant March 7th 2022 and CMV infection.Reports increased shortness of breath 2-3 weeks ago. He has a chronic dry cough, he has not been coughing since October. Cough started back around the same times as dyspnea symptoms. He notified his nephrologist with [facility], he was seen last week and had labs drawn.Taking lasix, dose was increased for three days. He is on prednisone 5mg  daily, mycophenolate and Bactrim MWF. Labs in April 2024 showed creatinine 1.9. Echocardiogram in April 2023 showed EF 50-55%, grade 1 DD. Denies fevers, chest tightness or chest pain.   Allergies  Allergen Reactions   Lipitor [Atorvastatin] Hives and Itching    Immunization History  Administered Date(s) Administered   Fluad Quad(high Dose 65+) 01/27/2021   Hepatitis B, ADULT 11/07/2018, 12/05/2018, 01/09/2019, 05/18/2019   Influenza Inj Mdck Quad Pf 02/16/2019   Influenza, Seasonal, Injecte, Preservative Fre 07/22/2012   Influenza,inj,Quad PF,6+ Mos 08/04/2014, 07/02/2016, 03/21/2017, 02/09/2019, 03/09/2020, 06/27/2022   Moderna Sars-Covid-2 Vaccination 07/24/2019, 08/21/2019   Pneumococcal Conjugate-13 11/05/2018   Pneumococcal Polysaccharide-23 07/22/2012, 05/03/2015   Tdap 05/14/2010    Past Medical History:  Diagnosis Date   A-V fistula (HCC) 03/12/2019   Abnormal CT of  the chest 06/08/2015   Abnormal echocardiogram 04/28/2019   Acute systolic CHF (congestive heart failure), NYHA class 2 (HCC) 05/04/2015   Acute-on-chronic kidney injury (HCC) 05/04/2015   Anemia 06/06/2015   Anemia in chronic kidney disease 11/18/2018   Body mass index (BMI) 27.0-27.9, adult 10/20/2018   Chronic systolic CHF (congestive heart failure) (HCC) 06/15/2015   Coronary artery disease 02/17/2018   Cardiac catheterization 2017 showing 10% LAD and 10% RCA Echocardiogram and stress test done in December 2019 showed evidence of old small inferior lateral myocardial infarction   Diabetes (HCC) 06/20/2019   Diabetes mellitus    Diarrhea, unspecified 10/22/2018   Dyspnea    Encounter for general adult medical examination with abnormal findings 06/12/2019   Erectile dysfunction    ESRD (end stage renal disease) on dialysis [facility])    Essential hypertension 07/03/2016   Fluid overload, unspecified 01/26/2019   Hyperlipidemia    Hyperlipidemia associated with type 2 diabetes mellitus (HCC) 09/18/2011   Started statin therapy 09/2011.  Anticipate improvement as glucose control improves.    Hypertension    Iron deficiency anemia, unspecified 11/03/2018   Low testosterone 10/04/2015   12/01/2015 visit with Jerilee Field, MD at [facility].    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 75 mL 5   aspirin 81 MG EC tablet Take 1 tablet by mouth daily.     carvedilol (COREG) 6.25 MG tablet Take 6.25 mg by mouth 2 (two) times daily with a meal.     Continuous Blood Gluc Sensor (FREESTYLE LIBRE 14 DAY SENSOR) MISC Apply topically every 14 (fourteen) days.     empagliflozin (JARDIANCE) 10 MG TABS tablet Take 1 tablet by mouth daily.     hydrALAZINE (APRESOLINE) 25 MG tablet TAKE 1 TABLET BY MOUTH THREE TIMES DAILY. 270 tablet 1   Insulin Lispro w/ Trans Port 100 UNIT/ML SOPN Inject 5 Units into the skin 3 (three) times daily.     isosorbide mononitrate (IMDUR) 120 MG 24 hr tablet Take 1 tablet (120 mg total) by mouth daily. 90 tablet 3   latanoprost (XALATAN) 0.005 % ophthalmic solution Place 1 drop into both eyes at bedtime.     magnesium oxide (MAG-OX) 400 MG tablet Take 2 tablets by mouth daily.     mycophenolate (MYFORTIC) 180 MG EC tablet Take 2 tablets by mouth 2 (two) times daily.     NIFEdipine (PROCARDIA-XL/NIFEDICAL-XL) 30 MG 24 hr tablet Take 2 tablets by mouth daily.     predniSONE (DELTASONE) 5 MG tablet Take 5 mg by mouth daily with breakfast.     sulfamethoxazole-trimethoprim (BACTRIM) 400-80 MG tablet Take 1 tablet by mouth 3 (three) times a week.     tacrolimus (PROGRAF) 1 MG capsule Take 3 mg by mouth 2 (two) times daily.     TRESIBA FLEXTOUCH 200 UNIT/ML FlexTouch Pen ADMINISTER UP TO 40 UNITS UNDER THE SKIN EVERY DAY (Patient taking differently: 36 Units daily. ADMINISTER UP TO 40 UNITS UNDER THE SKIN EVERY DAY) 9 mL 1   valGANciclovir (VALCYTE) 450 MG tablet Take 450 mg by mouth daily.     Continuous Blood Gluc Receiver (FREESTYLE LIBRE 2 READER) DEVI 1 each by Other route See admin instructions. Glucose check (Patient not taking: Reported on 12/05/2022)     No facility-administered medications prior to visit.      Review of Systems  Review of Systems  Constitutional:  Negative.  Negative for fever.  HENT: Negative.    Respiratory:  Positive for cough and shortness of breath. Negative for chest tightness and wheezing.   Cardiovascular: Negative.  Negative for leg swelling.     Physical Exam  BP 126/70 (BP Location: Right Arm, Patient Position: Sitting, Cuff Size: Normal)   Pulse 64   Temp 98.2 F (36.8 C) (Oral)   Ht 5\' 9"  (1.753 m)   Wt 171 lb 9.6 oz (77.8 kg)   SpO2 96%   BMI 25.34 kg/m  Physical Exam Constitutional:      General: He is not in acute distress.    Appearance: Normal appearance. He is not ill-appearing.  HENT:     Head: Normocephalic and atraumatic.     Mouth/Throat:     Mouth: Mucous membranes are moist.     Pharynx: Oropharynx is clear.  Cardiovascular:     Rate and Rhythm: Normal rate and regular rhythm.  Pulmonary:     Effort: Pulmonary effort is normal.     Breath sounds: Normal breath sounds.     Comments: CTA Musculoskeletal:  General: Normal range of motion.  Skin:    General: Skin is warm and dry.  Neurological:     General: No focal deficit present.     Mental Status: He is alert and oriented to person, place, and time. Mental status is at baseline.  Psychiatric:        Mood and Affect: Mood normal.        Behavior: Behavior normal.        Thought Content: Thought content normal.        Judgment: Judgment normal.      Lab Results:  CBC    Component Value Date/Time   WBC 8.6 04/23/2019 0912   WBC 13.2 (H) 05/27/2018 0429   RBC 4.19 04/23/2019 0912   RBC 4.24 05/27/2018 0429   HGB 11.8 (L) 04/23/2019 0912   HCT 35.7 (L) 04/23/2019 0912   PLT 246 04/23/2019 0912   MCV 85 04/23/2019 0912   MCH 28.2 04/23/2019 0912   MCH 25.7 (L) 05/27/2018 0429   MCHC 33.1 04/23/2019 0912   MCHC 33.9 05/27/2018 0429   RDW 14.7 04/23/2019 0912   LYMPHSABS 1.1 05/22/2018 2016   LYMPHSABS 1.1 03/21/2017 1124   MONOABS 1.1 (H) 05/22/2018 2016   EOSABS 0.2 05/22/2018 2016   EOSABS 0.2 03/21/2017 1124    BASOSABS 0.1 05/22/2018 2016   BASOSABS 0.1 03/21/2017 1124    BMET    Component Value Date/Time   NA 135 04/23/2019 0912   K 4.4 04/23/2019 0912   CL 91 (L) 04/23/2019 0912   CO2 26 04/23/2019 0912   GLUCOSE 330 (H) 04/23/2019 0912   GLUCOSE 191 (H) 06/03/2018 1432   BUN 35 (H) 04/23/2019 0912   CREATININE 6.52 (H) 04/23/2019 0912   CREATININE 1.45 (H) 07/26/2015 0919   CALCIUM 9.2 04/23/2019 0912   GFRNONAA 9 (L) 04/23/2019 0912   GFRAA 10 (L) 04/23/2019 0912    BNP    Component Value Date/Time   BNP 1,449.4 (H) 05/24/2018 0411    ProBNP    Component Value Date/Time   PROBNP 1,460.0 (H) 05/20/2018 1709    Imaging: No results found.   Assessment & Plan:   Sarcoidosis - Presumed diagnosis based on mediastinal lymphadenopathy, nodularity and elevated ACE level. Never biopsied.  Patient presents today with increased shortness of breath and cough over the last 2 to 3 weeks. Respiratory exam was benign, lungs clear to auscultation.  We will get updated chest imaging and ACE level to monitor sarcoidosis.  Also check BNP due to history of grade 1 diastolic dysfunction. No changes to medications today. Fu in 6 months for routine visit with Dr. Vassie Loll.   Glenford Bayley, NP 12/05/2022

## 2023-01-11 DIAGNOSIS — Z8619 Personal history of other infectious and parasitic diseases: Secondary | ICD-10-CM | POA: Diagnosis not present

## 2023-01-11 DIAGNOSIS — E785 Hyperlipidemia, unspecified: Secondary | ICD-10-CM | POA: Diagnosis not present

## 2023-01-11 DIAGNOSIS — D869 Sarcoidosis, unspecified: Secondary | ICD-10-CM | POA: Diagnosis not present

## 2023-01-11 DIAGNOSIS — I129 Hypertensive chronic kidney disease with stage 1 through stage 4 chronic kidney disease, or unspecified chronic kidney disease: Secondary | ICD-10-CM | POA: Diagnosis not present

## 2023-01-11 DIAGNOSIS — Z940 Kidney transplant status: Secondary | ICD-10-CM | POA: Diagnosis not present

## 2023-01-11 DIAGNOSIS — N189 Chronic kidney disease, unspecified: Secondary | ICD-10-CM | POA: Diagnosis not present

## 2023-01-11 DIAGNOSIS — I5022 Chronic systolic (congestive) heart failure: Secondary | ICD-10-CM | POA: Diagnosis not present

## 2023-01-11 DIAGNOSIS — E1129 Type 2 diabetes mellitus with other diabetic kidney complication: Secondary | ICD-10-CM | POA: Diagnosis not present

## 2023-01-13 IMAGING — DX DG CHEST 2V
2 series · 2 of 2 positions shown · non-contrast
Comparison: Previous studies including chest radiographs done on
12/31/2019 and CT chest done on 01/14/2020

CLINICAL DATA: Sarcoidosis

EXAM:
CHEST - 2 VIEW

[chest pa]
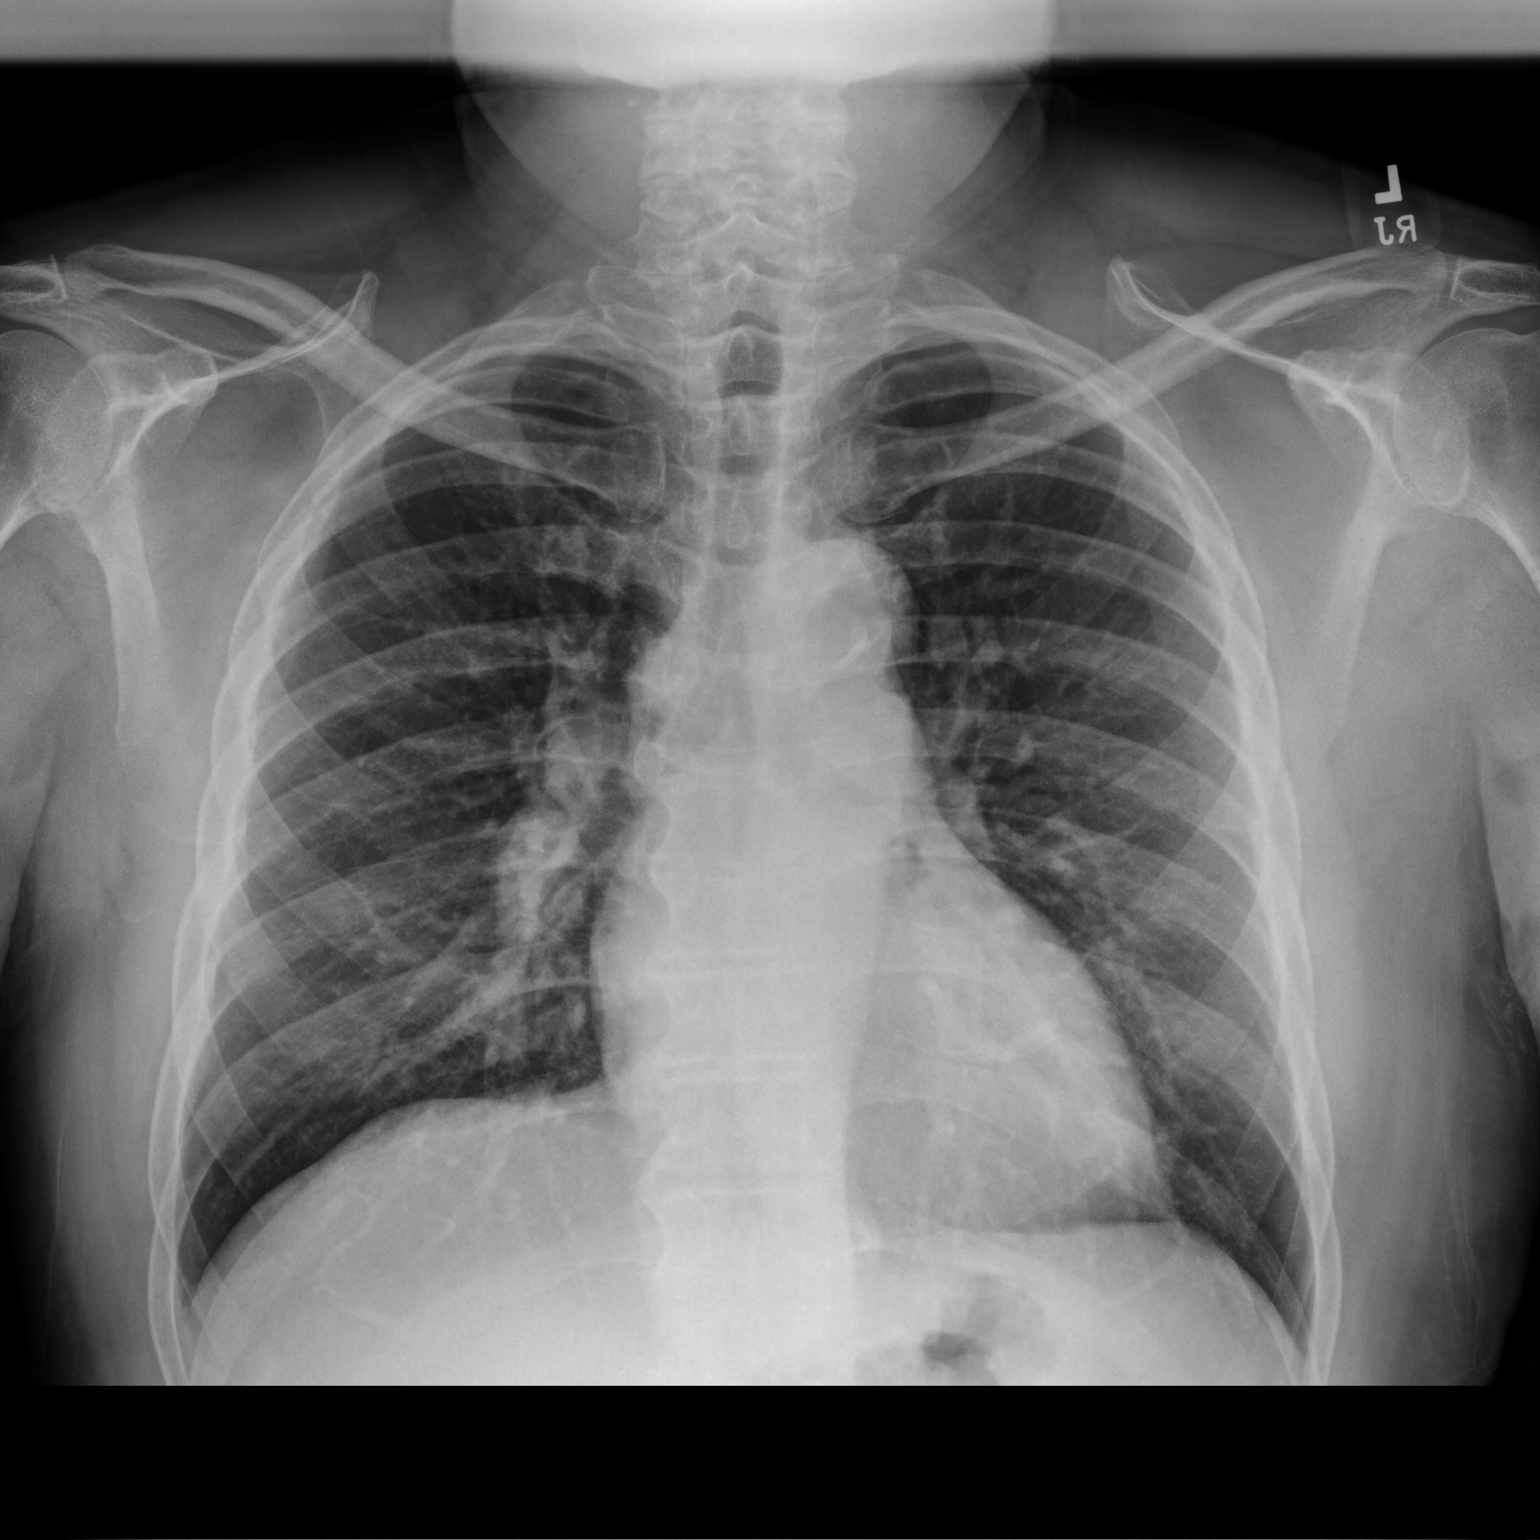

[chest lat]
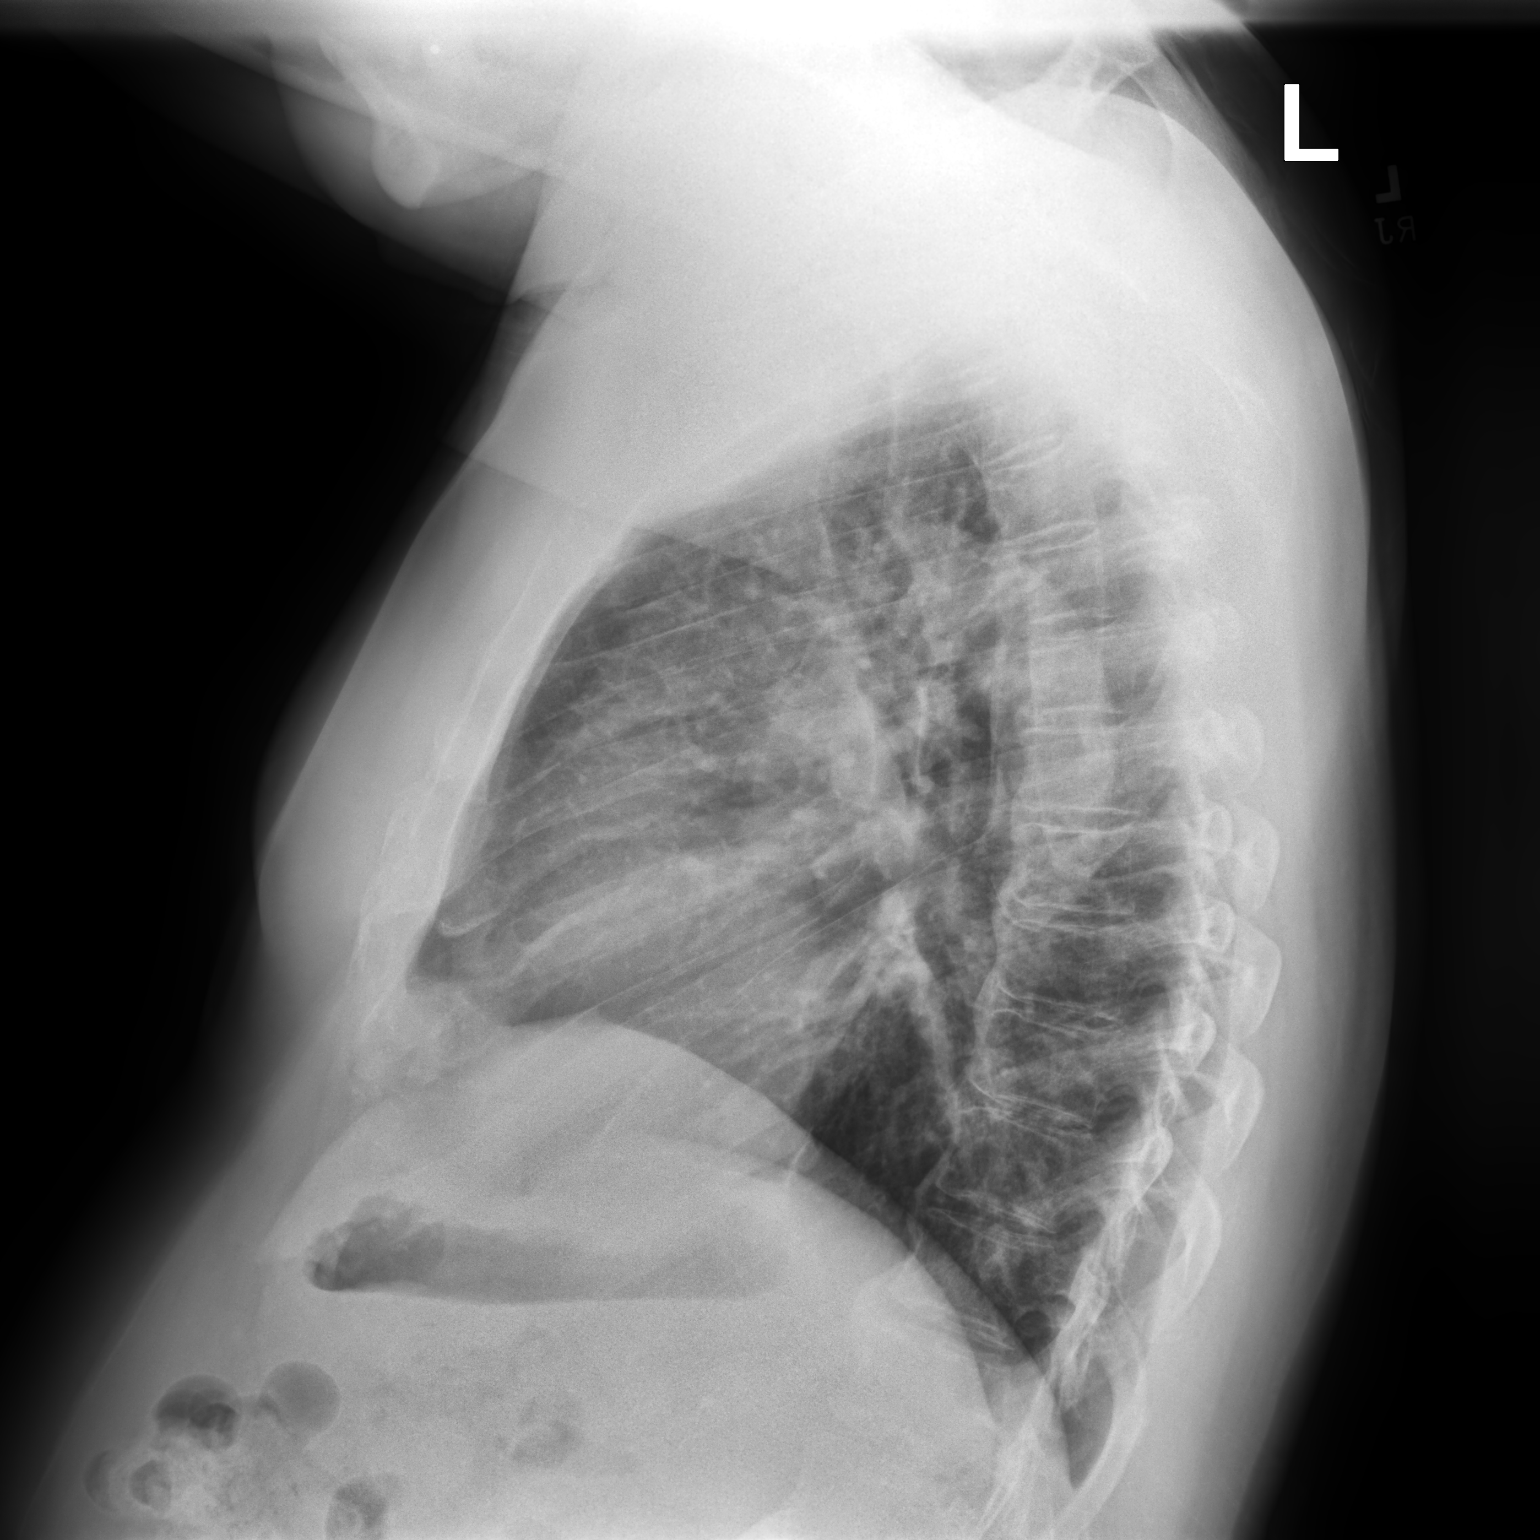

[2 of 2 positions shown; findings below may reference images not displayed]

FINDINGS: Cardiac size is within normal limits. There are no signs of
pulmonary edema or new focal infiltrates. There is no pleural
effusion or pneumothorax. No significant interval changes are noted
in the lung fields since 12/31/2019. Small nodular densities seen in
the previous CT could not be distinctly identified in the
radiographs.
IMPRESSION: No active cardiopulmonary disease.

## 2023-02-11 DIAGNOSIS — E1121 Type 2 diabetes mellitus with diabetic nephropathy: Secondary | ICD-10-CM | POA: Diagnosis not present

## 2023-02-11 DIAGNOSIS — Z794 Long term (current) use of insulin: Secondary | ICD-10-CM | POA: Diagnosis not present

## 2023-02-13 ENCOUNTER — Ambulatory Visit (INDEPENDENT_AMBULATORY_CARE_PROVIDER_SITE_OTHER): Payer: 59 | Admitting: Podiatry

## 2023-02-13 ENCOUNTER — Encounter: Payer: Self-pay | Admitting: Podiatry

## 2023-02-13 DIAGNOSIS — M79676 Pain in unspecified toe(s): Secondary | ICD-10-CM

## 2023-02-13 DIAGNOSIS — E1121 Type 2 diabetes mellitus with diabetic nephropathy: Secondary | ICD-10-CM

## 2023-02-13 DIAGNOSIS — Z794 Long term (current) use of insulin: Secondary | ICD-10-CM

## 2023-02-13 DIAGNOSIS — B351 Tinea unguium: Secondary | ICD-10-CM

## 2023-02-13 NOTE — Progress Notes (Signed)
This patient returns to my office for at risk foot care.  This patient requires this care by a professional since this patient will be at risk due to having type 2 diabetes and CKD. This patient is unable to cut nails himself since the patient cannot reach his nails.These nails are painful walking and wearing shoes.  This patient presents for at risk foot care today.  General Appearance  Alert, conversant and in no acute stress.  Vascular  Dorsalis pedis and posterior tibial  pulses are palpable  bilaterally.  Capillary return is within normal limits  bilaterally. Temperature is within normal limits  bilaterally.  Neurologic  Senn-Weinstein monofilament wire test within normal limits  bilaterally. Muscle power within normal limits bilaterally.  Nails Thick disfigured discolored nails with subungual debris  from hallux to fifth toes bilaterally. No evidence of bacterial infection or drainage bilaterally.  Orthopedic  No limitations of motion  feet .  No crepitus or effusions noted.  No bony pathology or digital deformities noted.  Skin  normotropic skin with no porokeratosis noted bilaterally.  No signs of infections or ulcers noted.     Onychomycosis  Pain in right toes  Pain in left toes  Consent was obtained for treatment procedures.   Mechanical debridement of nails 1-5  bilaterally performed with a nail nipper.  Filed with dremel without incident.    Return office visit    3 months                  Told patient to return for periodic foot care and evaluation due to potential at risk complications.   Helane Gunther DPM

## 2023-03-11 ENCOUNTER — Other Ambulatory Visit: Payer: Self-pay | Admitting: Cardiology

## 2023-03-13 DIAGNOSIS — E1129 Type 2 diabetes mellitus with other diabetic kidney complication: Secondary | ICD-10-CM | POA: Diagnosis not present

## 2023-03-13 DIAGNOSIS — Z8619 Personal history of other infectious and parasitic diseases: Secondary | ICD-10-CM | POA: Diagnosis not present

## 2023-03-13 DIAGNOSIS — Z940 Kidney transplant status: Secondary | ICD-10-CM | POA: Diagnosis not present

## 2023-03-13 DIAGNOSIS — N390 Urinary tract infection, site not specified: Secondary | ICD-10-CM | POA: Diagnosis not present

## 2023-03-13 DIAGNOSIS — I5022 Chronic systolic (congestive) heart failure: Secondary | ICD-10-CM | POA: Diagnosis not present

## 2023-03-13 DIAGNOSIS — D869 Sarcoidosis, unspecified: Secondary | ICD-10-CM | POA: Diagnosis not present

## 2023-03-13 DIAGNOSIS — I129 Hypertensive chronic kidney disease with stage 1 through stage 4 chronic kidney disease, or unspecified chronic kidney disease: Secondary | ICD-10-CM | POA: Diagnosis not present

## 2023-03-13 LAB — LAB REPORT - SCANNED
Creatinine, POC: 107.6 mg/dL
EGFR: 40

## 2023-04-29 DIAGNOSIS — Z978 Presence of other specified devices: Secondary | ICD-10-CM | POA: Diagnosis not present

## 2023-04-29 DIAGNOSIS — E1121 Type 2 diabetes mellitus with diabetic nephropathy: Secondary | ICD-10-CM | POA: Diagnosis not present

## 2023-05-29 ENCOUNTER — Encounter: Payer: Self-pay | Admitting: Podiatry

## 2023-05-29 ENCOUNTER — Ambulatory Visit (INDEPENDENT_AMBULATORY_CARE_PROVIDER_SITE_OTHER): Payer: 59 | Admitting: Podiatry

## 2023-05-29 VITALS — Ht 70.0 in | Wt 177.0 lb

## 2023-05-29 DIAGNOSIS — E1121 Type 2 diabetes mellitus with diabetic nephropathy: Secondary | ICD-10-CM

## 2023-05-29 DIAGNOSIS — B351 Tinea unguium: Secondary | ICD-10-CM | POA: Diagnosis not present

## 2023-05-29 DIAGNOSIS — M79676 Pain in unspecified toe(s): Secondary | ICD-10-CM

## 2023-05-29 DIAGNOSIS — Z794 Long term (current) use of insulin: Secondary | ICD-10-CM

## 2023-06-05 ENCOUNTER — Encounter: Payer: Self-pay | Admitting: Podiatry

## 2023-06-05 NOTE — Progress Notes (Signed)
  Subjective:  Patient ID: Brandon Carroll, male    DOB: 10-31-1960,  MRN: [id number]  Brandon Carroll presents to clinic today for at risk foot care with h/o NIDDM, is s/p kidney transplant and is on chronic immunosuppressive therapy.  Patient is seen today for and painful thick toenails that are difficult to trim. Pain interferes with ambulation. Aggravating factors include wearing enclosed shoe gear. Pain is relieved with periodic professional debridement.  Chief Complaint  Patient presents with   DFc    He is here for diabetic foot care and nail trim, last A1C was 7.1, PCP is Dr. Okey Carroll, last office visit was 02/13/2023,   New problem(s): None.   PCP is Brandon Broker, MD.  Allergies  Allergen Reactions   Lipitor [Atorvastatin] Hives and Itching    Review of Systems: Negative except as noted in the HPI.  Objective: No changes noted in today's physical examination. There were no vitals filed for this visit. Brandon Carroll is a pleasant 63 y.o. male WD, WN in NAD. AAO x 3.  Vascular Examination: Capillary refill time immediate b/l. Vascular status intact b/l with palpable pedal pulses. Pedal hair present b/l. No pain with calf compression b/l. Skin temperature gradient WNL b/l. No cyanosis or clubbing b/l. No ischemia or gangrene noted b/l. No edema b/l.  Neurological Examination: Sensation grossly intact b/l with 10 gram monofilament. Vibratory sensation intact b/l.   Dermatological Examination: Pedal skin with normal turgor, texture and tone b/l.  No open wounds. No interdigital macerations.   Toenails 1-5 b/l thick, discolored, elongated with subungual debris and pain on dorsal palpation.   No hyperkeratotic nor porokeratotic lesions present on today's visit.  Musculoskeletal Examination: Muscle strength 5/5 to all lower extremity muscle groups bilaterally. 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  Radiographs: None  Assessment/Plan: 1. Pain due  to onychomycosis of toenail   2. Type 2 diabetes mellitus with diabetic nephropathy, with long-term current use of insulin (HCC)     -Consent given for treatment as described below: -Examined patient. -Continue foot and shoe inspections daily. Monitor blood glucose per PCP/Endocrinologist's recommendations. -Mycotic toenails 1-5 bilaterally were debrided in length and girth with sterile nail nippers and dremel without incident. -Patient/POA to call should there be question/concern in the interim.   Return in about 3 months (around 08/27/2023).  Brandon Carroll, DPM      [place] LOCATION: [address], [place] [address]                   Office [phone]   [facility] LOCATION: [address] [place], [place] [address] Office [phone]

## 2023-06-11 ENCOUNTER — Ambulatory Visit (HOSPITAL_BASED_OUTPATIENT_CLINIC_OR_DEPARTMENT_OTHER): Payer: 59

## 2023-06-11 ENCOUNTER — Ambulatory Visit (HOSPITAL_BASED_OUTPATIENT_CLINIC_OR_DEPARTMENT_OTHER): Payer: 59 | Admitting: Pulmonary Disease

## 2023-06-11 ENCOUNTER — Encounter (HOSPITAL_BASED_OUTPATIENT_CLINIC_OR_DEPARTMENT_OTHER): Payer: Self-pay | Admitting: Pulmonary Disease

## 2023-06-11 VITALS — BP 148/78 | HR 68 | Resp 16 | Ht 70.0 in | Wt 185.2 lb

## 2023-06-11 DIAGNOSIS — Z940 Kidney transplant status: Secondary | ICD-10-CM | POA: Diagnosis not present

## 2023-06-11 DIAGNOSIS — D869 Sarcoidosis, unspecified: Secondary | ICD-10-CM

## 2023-06-11 DIAGNOSIS — I700 Atherosclerosis of aorta: Secondary | ICD-10-CM | POA: Diagnosis not present

## 2023-06-11 DIAGNOSIS — B259 Cytomegaloviral disease, unspecified: Secondary | ICD-10-CM

## 2023-06-11 MED ORDER — ALBUTEROL SULFATE (2.5 MG/3ML) 0.083% IN NEBU: 2.5000 mg | INHALATION_SOLUTION | Freq: Four times a day (QID) | RESPIRATORY_TRACT | 5 refills | Status: DC | PRN

## 2023-06-11 NOTE — Progress Notes (Addendum)
Subjective:    Patient ID: Brandon Carroll, male    DOB: 24-Jan-1961, 63 y.o.   MRN: [id number]  HPI  63 yo never smoker for follow-up of sarcoid w/ mediastinal adenopathy   -never been biopsied, elev ACE level - former patient of Dr. Kriste Basque    PMH-insulin requiring diabetes,  nonischemic cardiomyopathy and hypertension LHC 04/2019 showed multivessel non -sig disease ESRD on HD M/W/F since 10/2018, -renal transplant 2022, complicated by CMV infection and acute rejection   Discussed the use of AI scribe software for clinical note transcription with the patient, who gave verbal consent to proceed.  History of Present Illness   The patient, with a history of sarcoidosis, myositis, lymphadenopathy, renal transplant, and CMV infection, presents with recent weight fluctuation and bloating. They report their weight has been 'jumping four to five pounds every other day.' They have been taking furosemide twice daily for three days to manage this issue, but the weight fluctuation persists. They also report bloating throughout the day.  The patient is currently on mycophenolate, tacrolimus, and prednisone for transplant rejection prevention. They also take Prograf for their kidney transplant and a CMV medication. They report fatigue and fluid retention, which they attribute to either their CMV infection or sarcoidosis.  The patient also reports occasional shortness of breath and a cough, which has improved. They express a desire to restart albuterol treatment for their breathing. They also mention neck pain that radiates upwards, for which they plan to see their primary care doctor.          Significant tests/ events reviewed PFTs 06/2019 -no airway obstruction, FVC 85%, TLC 57% DLCO 17.5/63%     01/2020 Ct chest wo con >> multiple small pulmonary nodules scattered throughout the lungs, which are stable compared to 12/2016      12/2016 CT chest without contrast No substantial interval change  compared to 2017 the mediastinal and bilateral hilar lymphadenopathy is similar to prior. Scattered parenchymal and mainly perifissural nodularity also with similar appearance. Dominant 6 mm right upper lobe nodule is unchanged.   05/2018 echo > grade 2 diastolic dysfunction, PA pressure not well estimated 05/2018 VQ scan negative   Spirometry 05/2015 ratio 78, FEV1 79%, FVC 81%   Review of Systems neg for any significant sore throat, dysphagia, itching, sneezing, nasal congestion or excess/ purulent secretions, fever, chills, sweats, unintended wt loss, pleuritic or exertional cp, hempoptysis, orthopnea pnd or change in chronic leg swelling. Also denies presyncope, palpitations, heartburn, abdominal pain, nausea, vomiting, diarrhea or change in bowel or urinary habits, dysuria,hematuria, rash, arthralgias, visual complaints, headache, numbness weakness or ataxia.     Objective:   Physical Exam  Gen. Pleasant, well-nourished, in no distress ENT - no thrush, no pallor/icterus,no post nasal drip Neck: No JVD, no thyromegaly, no carotid bruits Lungs: no use of accessory muscles, no dullness to percussion, clear without rales or rhonchi  Cardiovascular: Rhythm regular, heart sounds  normal, no murmurs or gallops, no peripheral edema Musculoskeletal: No deformities, no cyanosis or clubbing        Assessment & Plan:     Assessment and Plan    Sarcoidosis Sarcoidosis is well-managed with immunosuppressive medications (mycophenolate, tacrolimus, prednisone) for their renal transplant. Recent chest x-ray was normal. Intermittent bloating and weight fluctuations may be related to fluid retention. - Order chest x-ray to monitor sarcoidosis - Continue current immunosuppressive regimen (mycophenolate, tacrolimus, prednisone)  Renal Transplant Renal transplant is functioning well. Weight fluctuations and fluid retention are managed with furosemide.  No significant shortness of breath or cough  noted. Discussed the importance of monitoring weight. 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 Discussed the overlapping symptoms and the importance of continuing CMV medication. - Continue CMV medication as prescribed   General Health Maintenance Up to date with vaccinations and general health screenings. - Schedule follow-up visit in six months  Follow-up - Follow up with nephrologist tomorrow - Follow up with primary care physician the day after tomorrow - Schedule annual check-up with pulmonology.      COPD - technically no airway obstruction but symptomatically better with nebs. So , will provide him with new nebuliser

## 2023-06-11 NOTE — Patient Instructions (Signed)
CXR today.

## 2023-06-12 DIAGNOSIS — E1121 Type 2 diabetes mellitus with diabetic nephropathy: Secondary | ICD-10-CM | POA: Diagnosis not present

## 2023-06-13 DIAGNOSIS — Z940 Kidney transplant status: Secondary | ICD-10-CM | POA: Diagnosis not present

## 2023-06-13 DIAGNOSIS — I129 Hypertensive chronic kidney disease with stage 1 through stage 4 chronic kidney disease, or unspecified chronic kidney disease: Secondary | ICD-10-CM | POA: Diagnosis not present

## 2023-06-13 DIAGNOSIS — E1129 Type 2 diabetes mellitus with other diabetic kidney complication: Secondary | ICD-10-CM | POA: Diagnosis not present

## 2023-06-13 DIAGNOSIS — N189 Chronic kidney disease, unspecified: Secondary | ICD-10-CM | POA: Diagnosis not present

## 2023-06-13 DIAGNOSIS — Z8619 Personal history of other infectious and parasitic diseases: Secondary | ICD-10-CM | POA: Diagnosis not present

## 2023-06-13 DIAGNOSIS — D631 Anemia in chronic kidney disease: Secondary | ICD-10-CM | POA: Diagnosis not present

## 2023-06-13 DIAGNOSIS — I5022 Chronic systolic (congestive) heart failure: Secondary | ICD-10-CM | POA: Diagnosis not present

## 2023-06-13 DIAGNOSIS — D869 Sarcoidosis, unspecified: Secondary | ICD-10-CM | POA: Diagnosis not present

## 2023-06-13 DIAGNOSIS — N2581 Secondary hyperparathyroidism of renal origin: Secondary | ICD-10-CM | POA: Diagnosis not present

## 2023-06-14 ENCOUNTER — Ambulatory Visit: Payer: 59 | Admitting: Internal Medicine

## 2023-06-17 ENCOUNTER — Ambulatory Visit (INDEPENDENT_AMBULATORY_CARE_PROVIDER_SITE_OTHER): Payer: 59

## 2023-06-17 ENCOUNTER — Encounter: Payer: Self-pay | Admitting: Internal Medicine

## 2023-06-17 ENCOUNTER — Ambulatory Visit (INDEPENDENT_AMBULATORY_CARE_PROVIDER_SITE_OTHER): Payer: 59 | Admitting: Internal Medicine

## 2023-06-17 VITALS — BP 130/64 | HR 57 | Temp 98.7°F | Ht 70.0 in | Wt 184.6 lb

## 2023-06-17 DIAGNOSIS — M5030 Other cervical disc degeneration, unspecified cervical region: Secondary | ICD-10-CM | POA: Diagnosis not present

## 2023-06-17 DIAGNOSIS — E1121 Type 2 diabetes mellitus with diabetic nephropathy: Secondary | ICD-10-CM

## 2023-06-17 DIAGNOSIS — M542 Cervicalgia: Secondary | ICD-10-CM | POA: Diagnosis not present

## 2023-06-17 DIAGNOSIS — M4802 Spinal stenosis, cervical region: Secondary | ICD-10-CM | POA: Diagnosis not present

## 2023-06-17 DIAGNOSIS — D849 Immunodeficiency, unspecified: Secondary | ICD-10-CM

## 2023-06-17 DIAGNOSIS — B259 Cytomegaloviral disease, unspecified: Secondary | ICD-10-CM | POA: Diagnosis not present

## 2023-06-17 DIAGNOSIS — Z794 Long term (current) use of insulin: Secondary | ICD-10-CM

## 2023-06-17 NOTE — Progress Notes (Unsigned)
   Subjective:   Patient ID: Brandon Carroll, male    DOB: 11/03/1960, 63 y.o.   MRN: [id number]  HPI The patient is a 63 YO man coming in for neck pain.   Review of Systems  Objective:  Physical Exam  There were no vitals filed for this visit.  Assessment & Plan:

## 2023-06-17 NOTE — Patient Instructions (Signed)
We will check the x-ray of the neck to check.

## 2023-06-19 NOTE — Assessment & Plan Note (Signed)
 Foot exam done and stable neuropathy. He has had recent labs with stable sugar levels. Seeing endo for management given chronic steroids for his transplant.

## 2023-06-19 NOTE — Assessment & Plan Note (Signed)
 Given pain 1 month without improvement checking x-ray cervical spine. May benefit from PT depending on results. No numbness or weakness in hands.

## 2023-06-19 NOTE — Assessment & Plan Note (Signed)
 Taking valganciclovir for prevention of activation.

## 2023-06-19 NOTE — Assessment & Plan Note (Signed)
 Due to renal transplant and is on mycophenolate and prednisone  and bactrim ppx and prograf  and valganciclovir.

## 2023-06-20 ENCOUNTER — Encounter (HOSPITAL_BASED_OUTPATIENT_CLINIC_OR_DEPARTMENT_OTHER): Payer: Self-pay | Admitting: Pulmonary Disease

## 2023-06-21 ENCOUNTER — Encounter: Payer: Self-pay | Admitting: Internal Medicine

## 2023-06-21 ENCOUNTER — Other Ambulatory Visit (HOSPITAL_BASED_OUTPATIENT_CLINIC_OR_DEPARTMENT_OTHER): Payer: Self-pay

## 2023-06-21 DIAGNOSIS — D869 Sarcoidosis, unspecified: Secondary | ICD-10-CM

## 2023-06-21 MED ORDER — ALBUTEROL SULFATE (2.5 MG/3ML) 0.083% IN NEBU: 2.5000 mg | INHALATION_SOLUTION | Freq: Four times a day (QID) | RESPIRATORY_TRACT | 5 refills | Status: AC | PRN

## 2023-06-24 ENCOUNTER — Other Ambulatory Visit: Payer: Self-pay | Admitting: Internal Medicine

## 2023-06-24 DIAGNOSIS — M542 Cervicalgia: Secondary | ICD-10-CM

## 2023-06-25 ENCOUNTER — Telehealth: Payer: Self-pay | Admitting: Pulmonary Disease

## 2023-06-25 NOTE — Telephone Encounter (Signed)
Response from [facility] about DME order   [place], [place], [place]; [place], [place]; [place], [place]; [place], [place], [place]; 1 other Pt does not have a qualifying visit for [place] to bill the insurance for this item, the diagnosis is also not qualifying.

## 2023-06-25 NOTE — Telephone Encounter (Signed)
Does not qualify   [facility]: [place], [place], [address]; [place], [place]; [place], [place]; [place], [place], [place]; 1 other no, now his COPD would qualify for him.       Please change diagnosis to COPD for order

## 2023-06-27 ENCOUNTER — Telehealth: Payer: Self-pay | Admitting: Pulmonary Disease

## 2023-06-27 NOTE — Telephone Encounter (Signed)
Our office does not rx that medication to pt. Please call pt and inform him to f/u on this medication with the provider that does. Possibly pcp. That is a beta blocker for hypertension, heart failure, etc.

## 2023-06-27 NOTE — Telephone Encounter (Signed)
[facility] calling stating they sent over a fax for this PT's medication. (Carvedilol)

## 2023-07-01 ENCOUNTER — Telehealth: Payer: Self-pay | Admitting: Pulmonary Disease

## 2023-07-01 NOTE — Telephone Encounter (Signed)
 NFN

## 2023-07-01 NOTE — Telephone Encounter (Signed)
Robby Sermon states patient needs refill for Albuterol solution. [place] phone number is [phone].

## 2023-07-01 NOTE — Telephone Encounter (Signed)
Pcc's do not handle medications the nurse would have to check on this. Thanks

## 2023-07-01 NOTE — Telephone Encounter (Signed)
Looks like this Rx was sent on 06/21/2023 to [facility].Can you please check on this

## 2023-07-03 ENCOUNTER — Ambulatory Visit: Payer: Self-pay | Admitting: Internal Medicine

## 2023-07-03 DIAGNOSIS — I63412 Cerebral infarction due to embolism of left middle cerebral artery: Secondary | ICD-10-CM | POA: Diagnosis not present

## 2023-07-03 DIAGNOSIS — M47812 Spondylosis without myelopathy or radiculopathy, cervical region: Secondary | ICD-10-CM | POA: Diagnosis not present

## 2023-07-03 DIAGNOSIS — M542 Cervicalgia: Secondary | ICD-10-CM | POA: Diagnosis not present

## 2023-07-03 DIAGNOSIS — Z940 Kidney transplant status: Secondary | ICD-10-CM | POA: Diagnosis not present

## 2023-07-03 DIAGNOSIS — R6889 Other general symptoms and signs: Secondary | ICD-10-CM | POA: Diagnosis not present

## 2023-07-03 DIAGNOSIS — Z7984 Long term (current) use of oral hypoglycemic drugs: Secondary | ICD-10-CM | POA: Diagnosis not present

## 2023-07-03 DIAGNOSIS — I6522 Occlusion and stenosis of left carotid artery: Secondary | ICD-10-CM | POA: Diagnosis not present

## 2023-07-03 DIAGNOSIS — I130 Hypertensive heart and chronic kidney disease with heart failure and stage 1 through stage 4 chronic kidney disease, or unspecified chronic kidney disease: Secondary | ICD-10-CM | POA: Diagnosis not present

## 2023-07-03 DIAGNOSIS — Z79899 Other long term (current) drug therapy: Secondary | ICD-10-CM | POA: Diagnosis not present

## 2023-07-03 DIAGNOSIS — N183 Chronic kidney disease, stage 3 unspecified: Secondary | ICD-10-CM | POA: Diagnosis not present

## 2023-07-03 DIAGNOSIS — I10 Essential (primary) hypertension: Secondary | ICD-10-CM | POA: Diagnosis not present

## 2023-07-03 DIAGNOSIS — Z7952 Long term (current) use of systemic steroids: Secondary | ICD-10-CM | POA: Diagnosis not present

## 2023-07-03 DIAGNOSIS — I493 Ventricular premature depolarization: Secondary | ICD-10-CM | POA: Diagnosis not present

## 2023-07-03 DIAGNOSIS — N179 Acute kidney failure, unspecified: Secondary | ICD-10-CM | POA: Diagnosis not present

## 2023-07-03 DIAGNOSIS — Z794 Long term (current) use of insulin: Secondary | ICD-10-CM | POA: Diagnosis not present

## 2023-07-03 DIAGNOSIS — D849 Immunodeficiency, unspecified: Secondary | ICD-10-CM | POA: Diagnosis not present

## 2023-07-03 DIAGNOSIS — I6523 Occlusion and stenosis of bilateral carotid arteries: Secondary | ICD-10-CM | POA: Diagnosis not present

## 2023-07-03 DIAGNOSIS — Z7982 Long term (current) use of aspirin: Secondary | ICD-10-CM | POA: Diagnosis not present

## 2023-07-03 DIAGNOSIS — I444 Left anterior fascicular block: Secondary | ICD-10-CM | POA: Diagnosis not present

## 2023-07-03 DIAGNOSIS — E1122 Type 2 diabetes mellitus with diabetic chronic kidney disease: Secondary | ICD-10-CM | POA: Diagnosis not present

## 2023-07-03 DIAGNOSIS — W19XXXA Unspecified fall, initial encounter: Secondary | ICD-10-CM | POA: Diagnosis not present

## 2023-07-03 DIAGNOSIS — E119 Type 2 diabetes mellitus without complications: Secondary | ICD-10-CM | POA: Diagnosis not present

## 2023-07-03 DIAGNOSIS — R1110 Vomiting, unspecified: Secondary | ICD-10-CM | POA: Diagnosis not present

## 2023-07-03 DIAGNOSIS — I350 Nonrheumatic aortic (valve) stenosis: Secondary | ICD-10-CM | POA: Diagnosis not present

## 2023-07-03 DIAGNOSIS — I517 Cardiomegaly: Secondary | ICD-10-CM | POA: Diagnosis not present

## 2023-07-03 DIAGNOSIS — R29818 Other symptoms and signs involving the nervous system: Secondary | ICD-10-CM | POA: Diagnosis not present

## 2023-07-03 DIAGNOSIS — Z859 Personal history of malignant neoplasm, unspecified: Secondary | ICD-10-CM | POA: Diagnosis not present

## 2023-07-03 DIAGNOSIS — E785 Hyperlipidemia, unspecified: Secondary | ICD-10-CM | POA: Diagnosis not present

## 2023-07-03 DIAGNOSIS — I2510 Atherosclerotic heart disease of native coronary artery without angina pectoris: Secondary | ICD-10-CM | POA: Diagnosis not present

## 2023-07-03 DIAGNOSIS — R531 Weakness: Secondary | ICD-10-CM | POA: Diagnosis not present

## 2023-07-03 DIAGNOSIS — R29702 NIHSS score 2: Secondary | ICD-10-CM | POA: Diagnosis not present

## 2023-07-03 DIAGNOSIS — Z833 Family history of diabetes mellitus: Secondary | ICD-10-CM | POA: Diagnosis not present

## 2023-07-03 DIAGNOSIS — Z79624 Long term (current) use of inhibitors of nucleotide synthesis: Secondary | ICD-10-CM | POA: Diagnosis not present

## 2023-07-03 DIAGNOSIS — I63512 Cerebral infarction due to unspecified occlusion or stenosis of left middle cerebral artery: Secondary | ICD-10-CM | POA: Diagnosis not present

## 2023-07-03 DIAGNOSIS — R270 Ataxia, unspecified: Secondary | ICD-10-CM | POA: Diagnosis not present

## 2023-07-03 DIAGNOSIS — Z95828 Presence of other vascular implants and grafts: Secondary | ICD-10-CM | POA: Diagnosis not present

## 2023-07-03 DIAGNOSIS — I5022 Chronic systolic (congestive) heart failure: Secondary | ICD-10-CM | POA: Diagnosis not present

## 2023-07-03 DIAGNOSIS — I63232 Cerebral infarction due to unspecified occlusion or stenosis of left carotid arteries: Secondary | ICD-10-CM | POA: Diagnosis not present

## 2023-07-03 DIAGNOSIS — D860 Sarcoidosis of lung: Secondary | ICD-10-CM | POA: Diagnosis not present

## 2023-07-03 DIAGNOSIS — T8619 Other complication of kidney transplant: Secondary | ICD-10-CM | POA: Diagnosis not present

## 2023-07-03 DIAGNOSIS — Z7722 Contact with and (suspected) exposure to environmental tobacco smoke (acute) (chronic): Secondary | ICD-10-CM | POA: Diagnosis not present

## 2023-07-03 DIAGNOSIS — G8191 Hemiplegia, unspecified affecting right dominant side: Secondary | ICD-10-CM | POA: Diagnosis not present

## 2023-07-03 NOTE — Telephone Encounter (Signed)
  Chief Complaint: neuro deficit  Symptoms: no movement on right side Frequency: constant  Disposition: [x] ED /[] Urgent Care (no appt availability in office) / [] Appointment(In office/virtual)/ []  [place] [facility]/ [] Home Care/ [] Refused Recommended Disposition /[] [place] [facility]/ []  Follow-up with PCP Additional Notes: Pt called with concerns of losing complete use of right side. Pt stated this started Saturday with right leg just giving out. Pt fell but was able to brace fall and had no injuries. Pt stated the loss sensation went away, but same feeling came back Sunday. Pt can't use right arm or leg. Pt did not have slurred speech. Pt stated his smile is the "same- no issues." Pt stated he has not had use of his right side since  Sunday. Pt has fallen twice, but no injuries reported. Pt feels his is due to his Imdur medication. Pt stated, "I thought I took to much. Not sure though." RN called 911 for pt. RN stayed on phone until EMS arrived.               Copied from CRM [id number]. Topic: Clinical - Red Word Triage >> Jul 03, 2023  8:32 AM Dennison Nancy wrote: Red Word that prompted transfer to Nurse Triage: no feeling on his right side of body Reason for Disposition  [1] SEVERE weakness (i.e., unable to walk or barely able to walk, requires support) AND [2] new-onset or worsening  Answer Assessment - Initial Assessment Questions 1. SYMPTOM: "What is the main symptom you are concerned about?" (e.g., weakness, numbness)     Right sided loss of feeling  2. ONSET: "When did this start?" (minutes, hours, days; while sleeping)     Started Saturday  3. LAST NORMAL: "When was the last time you (the patient) were normal (no symptoms)?"     Friday  4. PATTERN "Does this come and go, or has it been constant since it started?"  "Is it present now?"     Comes and goes  5. CARDIAC SYMPTOMS: "Have you had any of the following symptoms: chest pain, difficulty breathing, palpitations?"      no 6. NEUROLOGIC SYMPTOMS: "Have you had any of the following symptoms: headache, dizziness, vision loss, double vision, changes in speech, unsteady on your feet?"     Unsteady on feet  7. OTHER SYMPTOMS: "Do you have any other symptoms?"     na  Protocols used: Neurologic Deficit-A-AH

## 2023-07-04 ENCOUNTER — Telehealth: Payer: Self-pay | Admitting: Pulmonary Disease

## 2023-07-04 DIAGNOSIS — I493 Ventricular premature depolarization: Secondary | ICD-10-CM | POA: Diagnosis not present

## 2023-07-04 DIAGNOSIS — I63512 Cerebral infarction due to unspecified occlusion or stenosis of left middle cerebral artery: Secondary | ICD-10-CM | POA: Diagnosis not present

## 2023-07-04 DIAGNOSIS — I63412 Cerebral infarction due to embolism of left middle cerebral artery: Secondary | ICD-10-CM | POA: Diagnosis not present

## 2023-07-04 DIAGNOSIS — R531 Weakness: Secondary | ICD-10-CM | POA: Diagnosis not present

## 2023-07-04 DIAGNOSIS — M47812 Spondylosis without myelopathy or radiculopathy, cervical region: Secondary | ICD-10-CM | POA: Diagnosis not present

## 2023-07-04 DIAGNOSIS — I444 Left anterior fascicular block: Secondary | ICD-10-CM | POA: Diagnosis not present

## 2023-07-04 DIAGNOSIS — I517 Cardiomegaly: Secondary | ICD-10-CM | POA: Diagnosis not present

## 2023-07-04 NOTE — Telephone Encounter (Signed)
Called and spoke to Celina with [facility]. She stated that albuterol Rx needs to be updated. She iwll be sending an updated Rx to [facility].  Please advise. Thanks

## 2023-07-04 NOTE — Telephone Encounter (Signed)
 Duplicate

## 2023-07-04 NOTE — Telephone Encounter (Signed)
[facility] calling again on a fax she sent twice  to amend the amount of Albuterol that was on the first order in error. She states she sent a corrected fax twice and has called twice to get this filled. When sent to Triage it was then fwd to PCC's. PCC's sent it back to Triage saying they do not handle medication. [facility]'s number is [phone] Option 1. Robby Sermon is who you need to ask for.

## 2023-07-05 DIAGNOSIS — R531 Weakness: Secondary | ICD-10-CM | POA: Diagnosis not present

## 2023-07-05 NOTE — Telephone Encounter (Signed)
Order placed in [facility] box for sig

## 2023-07-06 DIAGNOSIS — Z940 Kidney transplant status: Secondary | ICD-10-CM | POA: Diagnosis not present

## 2023-07-06 DIAGNOSIS — I6522 Occlusion and stenosis of left carotid artery: Secondary | ICD-10-CM | POA: Diagnosis not present

## 2023-07-06 DIAGNOSIS — R531 Weakness: Secondary | ICD-10-CM | POA: Diagnosis not present

## 2023-07-07 DIAGNOSIS — I63512 Cerebral infarction due to unspecified occlusion or stenosis of left middle cerebral artery: Secondary | ICD-10-CM | POA: Diagnosis not present

## 2023-07-07 DIAGNOSIS — R531 Weakness: Secondary | ICD-10-CM | POA: Diagnosis not present

## 2023-07-09 ENCOUNTER — Telehealth: Payer: Self-pay | Admitting: *Deleted

## 2023-07-09 NOTE — Telephone Encounter (Signed)
 Pt is a Dr Vassie Loll pt. Was this faxed to the right office?

## 2023-07-09 NOTE — Telephone Encounter (Signed)
 The fax in [facility] folder waiting for sig he will be back in the office on Monday 07/15/2023. Tried to speak with [facility] but got an automated message they would notify pharmacy team I called

## 2023-07-09 NOTE — Telephone Encounter (Signed)
 [facility] checking on fax sent 08/01/2023. [facility] phone number is [phone] option 1.

## 2023-07-09 NOTE — Telephone Encounter (Signed)
 Order signed by Dr Everardo All and faxed to [facility]

## 2023-07-09 NOTE — Transitions of Care (Post Inpatient/ED Visit) (Signed)
   07/09/2023  Name: Marcos Ruelas MRN: [id number] DOB: [birth year]  Today's TOC FU Call Status: Today's TOC FU Call Status:: Unsuccessful Call (1st Attempt) Unsuccessful Call (1st Attempt) Date: 07/09/23  Attempted to reach the patient regarding the most recent Inpatient visit; Received automated outgoing voice message stating that "the person you are trying to call has a voice mail box that is full; please hang up and try your call again later;" unable to leave voice message requesting call back   Follow Up Plan: Additional outreach attempts will be made to reach the patient to complete the Transitions of Care (Post Inpatient visit) call.   Caryl Pina, RN, BSN, Media planner  Transitions of Care  [facility] - [facility] [phone]: direct office

## 2023-07-10 ENCOUNTER — Telehealth: Payer: Self-pay | Admitting: *Deleted

## 2023-07-10 ENCOUNTER — Ambulatory Visit (INDEPENDENT_AMBULATORY_CARE_PROVIDER_SITE_OTHER): Payer: 59

## 2023-07-10 VITALS — BP 138/80 | HR 68 | Ht 67.0 in | Wt 178.6 lb

## 2023-07-10 DIAGNOSIS — Z0000 Encounter for general adult medical examination without abnormal findings: Secondary | ICD-10-CM

## 2023-07-10 DIAGNOSIS — J449 Chronic obstructive pulmonary disease, unspecified: Secondary | ICD-10-CM | POA: Diagnosis not present

## 2023-07-10 NOTE — Patient Instructions (Addendum)
 Brandon Carroll , Thank you for taking time to come for your Medicare Wellness Visit. I appreciate your ongoing commitment to your health goals. Please review the following plan we discussed and let me know if I can assist you in the future.   Referrals/Orders/Follow-Ups/Clinician Recommendations: Aim for 30 minutes of exercise or brisk walking, 6-8 glasses of water, and 5 servings of fruits and vegetables each day. Scheduled a follow-up appt w/Dr Okey Dupre due to being discharged from hospital.  Will also schedule a physical appt w/Dr Okey Dupre.  Educated and advised to get the Tdap, Influenza, and COVID vaccines at local pharmacy.  This is a list of the screening recommended for you and due dates:  Health Maintenance  Topic Date Due   Zoster (Shingles) Vaccine (1 of 2) Never done   Yearly kidney health urinalysis for diabetes  03/21/2018   COVID-19 Vaccine (3 - Moderna risk series) 09/18/2019   DTaP/Tdap/Td vaccine (2 - Td or Tdap) 05/14/2020   Hemoglobin A1C  06/23/2022   Flu Shot  12/13/2022   Eye exam for diabetics  01/09/2023   Yearly kidney function blood test for diabetes  03/12/2024   Complete foot exam   06/16/2024   Medicare Annual Wellness Visit  07/09/2024   Pneumococcal Vaccination (4 of 4 - PPSV23 or PCV20) 05/11/2028   Colon Cancer Screening  06/22/2029   Hepatitis C Screening  Completed   HIV Screening  Completed   HPV Vaccine  Aged Out    Advanced directives: (Provided) Advance directive discussed with you today. I have provided a copy for you to complete at home and have notarized. Once this is complete, please bring a copy in to our office so we can scan it into your chart.   Next Medicare Annual Wellness Visit scheduled for next year: Yes - 08/2024

## 2023-07-10 NOTE — Transitions of Care (Post Inpatient/ED Visit) (Signed)
 07/10/2023  Name: Brandon Carroll MRN: [id number] DOB: 08-Jul-1960  Today's TOC FU Call Status: Today's TOC FU Call Status:: Successful TOC FU Call Completed TOC FU Call Complete Date: 07/10/23 Patient's Name and Date of Birth confirmed.  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  Thanks for updating all of these medications for me.  I will take your number in case I need anything in the future") Any questions or concerns?: No  Items Reviewed: Did you receive and understand the discharge instructions provided?: Yes (briefly reviewed with patient who verbalizes good understanding of same - outside hospital AVS) Medications obtained,verified, and reconciled?: Yes (Medications Reviewed) (Full medication reconciliation/ review completed; no concerns or discrepancies identified; confirmed patient obtained/ is taking all newly Rx'd medications as instructed; self-manages medications and denies questions/ concerns around medications today) Any new allergies since your discharge?: No Dietary orders reviewed?: Yes Type of Diet Ordered:: "Healthy, I watch what I eat" Do you have support at home?: Yes People in Home: alone Name of Support/Comfort Primary Source: Reports independent in self-care activities; resides alone in one level apartment; supportive local brother assists as/ if needed/ indicated  Medications Reviewed Today: Medications Reviewed Today     Reviewed by Michaela Corner, RN (Registered Nurse) on 07/10/23 at 1241  Med List  Status: <None>   Medication Order Taking? Sig Documenting Provider Last Dose Status Informant  albuterol (PROVENTIL) (2.5 MG/3ML) 0.083% nebulizer solution [id number] Yes Take 3 mLs (2.5 mg total) by nebulization every 6 (six) hours as needed for wheezing or shortness of breath. Oretha Milch, MD Taking Active   aspirin 81 MG chewable tablet [id number] Yes Chew 81 mg by mouth once. [provider] Taking Active   carvedilol (COREG) 6.25 MG tablet [id number] Yes Take 6.25 mg by mouth 2 (two) times daily with a meal. [provider] Taking Active            Med Note Michaela Corner   Wed Jul 10, 2023 12:18 PM) 07/10/23: Reports during TOC call, he has been taking 25 mg BID for "a long time;" updated dose today according to patient report   clopidogrel (PLAVIX) 75 MG tablet [id number] Yes Take 1 tablet by mouth daily. [provider] Taking Active   Continuous Blood Gluc Receiver (FREESTYLE LIBRE 2 READER) DEVI [id number] Yes 1 each by Other route See admin instructions. Glucose check [provider] Taking Active   Continuous Blood Gluc Sensor (FREESTYLE LIBRE 14 DAY SENSOR) MISC [id number] Yes Apply topically every 14 (fourteen) days. [provider] Taking Active   empagliflozin (JARDIANCE) 10 MG TABS tablet [id number] Yes Take 1 tablet by mouth daily. [provider] Taking Active   hydrALAZINE (APRESOLINE) 25 MG tablet [id number] Yes Take 1 tablet (25 mg total) by mouth 3 (three) times daily. 1rst attempt, patient needs and appt for additional refills Georgeanna Lea, MD Taking Active   insulin lispro (HUMALOG) 100 UNIT/ML injection [id number] Yes Inject 12 Units into the skin 3 (three) times daily with meals. 07/10/23: Reports during TOC call, he was prescribed this new insulin at time of hospital discharge  on 07/08/23- verified was not on his current medication list- added today according to patient report- he reports taking 12 U with each meal, TID  Myrlene Broker, MD Taking Active Self           Med Note Otelia Limes Jul 10, 2023 12:25 PM) 07/10/23: Reports during TOC call, he was prescribed this new insulin at time of hospital discharge on 07/08/23- verified was not on his current medication list- added today according to patient report- he reports taking 12 U with each meal, TID   Insulin Lispro w/ Trans Port 100 UNIT/ML SOPN [id number] No Inject 5 Units into the skin 3 (three) times daily.  Patient not taking: Reported on 07/10/2023   [provider] Not Taking Active   isosorbide mononitrate (IMDUR) 120 MG 24 hr tablet [id number] Yes Take 1 tablet (120 mg total) by mouth daily. Georgeanna Lea, MD Taking Active            Med Note Otelia Limes Jul 10, 2023 12:20 PM) 07/10/23: Reports during TOC call, he has been taking 60 mg BID for "a long time;" updated dose and schedule on current medication list- updated today according to patient report   latanoprost (XALATAN) 0.005 % ophthalmic solution [id number] Yes Place 1 drop into both eyes at bedtime. [provider] Taking Active   magnesium oxide (MAG-OX) 400 MG tablet [id number] Yes Take 2 tablets by mouth daily. [provider] Taking Active   mycophenolate (MYFORTIC) 180 MG EC tablet [id number] Yes Take 2 tablets by mouth 2 (two) times daily. [provider] Taking Active   NIFEdipine (PROCARDIA-XL/NIFEDICAL-XL) 30 MG 24 hr tablet [id number] Yes Take 2 tablets by mouth daily. [provider] Taking Active   predniSONE (DELTASONE) 5 MG tablet [id number] Yes Take 5 mg by mouth daily with breakfast. [provider] Taking Active Self  rosuvastatin (CRESTOR) 20 MG tablet [id number] Yes Take 1 tablet by mouth daily. [provider] Taking Active   sulfamethoxazole-trimethoprim (BACTRIM) 400-80 MG tablet [id number] Yes Take 1 tablet by mouth 3 (three) times a week. [provider] Taking Active    tacrolimus (PROGRAF) 1 MG capsule [id number] Yes Take 3 mg by mouth 2 (two) times daily. [provider] Taking Active   torsemide (DEMADEX) 20 MG tablet [id number] Yes Take 20 mg by mouth daily. 07/10/23: Reports during TOC call, he has been taking for "a long time;" verified was not on his current medication list- added today according to patient report Myrlene Broker, MD Taking Active Self           Med Note Otelia Limes Jul 10, 2023 12:17 PM) 07/10/23: Reports during TOC call, he has been taking for "a long time;" verified was not on his current medication list- added today according to patient report   TRESIBA FLEXTOUCH 200 UNIT/ML FlexTouch Pen [id number] Yes ADMINISTER UP TO 40 UNITS UNDER THE SKIN EVERY DAY  Patient taking differently: 36 Units daily. ADMINISTER UP TO 40 UNITS UNDER THE SKIN EVERY DAY   Reather Littler, MD Taking Active   valGANciclovir (VALCYTE) 450 MG tablet [id number] No Take 450 mg by mouth daily.  Patient not taking: Reported on 07/10/2023   [provider] Not Taking Active            Med Note Otelia Limes Jul 10, 2023 12:27 PM) 07/10/23: Reports during TOC call, he is no  longer taking at this time             Home Care and Equipment/Supplies: Were Home Health Services Ordered?: No (confirms outpatient PT has been initiated with services to start tomorrow) Any new equipment or medical supplies ordered?: No  Functional Questionnaire: Do you need assistance with bathing/showering or dressing?: No Do you need assistance with meal preparation?: No Do you need assistance with eating?: No Do you have difficulty maintaining continence: No Do you need assistance with getting out of bed/getting out of a chair/moving?: No Do you have difficulty managing or taking your medications?: No  Follow up appointments reviewed: PCP Follow-up appointment confirmed?: Yes Date of PCP follow-up appointment?: 07/23/23 Follow-up Provider:  PCP- Dr. Okey Dupre Specialist [facility] Follow-up appointment confirmed?: NA (verified not indicated per hospital discharging provider discharge notes- encouraged patient to discuss possible need for neurology referral at time of hospital follow up visit on 07/23/23) Do you need transportation to your follow-up appointment?: No Do you understand care options if your condition(s) worsen?: Yes-patient verbalized understanding  SDOH Interventions Today    Flowsheet Row Most Recent Value  SDOH Interventions   Food Insecurity Interventions Intervention Not Indicated  Housing Interventions Intervention Not Indicated  Transportation Interventions Intervention Not Indicated  [drives self]  Utilities Interventions Intervention Not Indicated      Interventions Today    Flowsheet Row Most Recent Value  Chronic Disease   Chronic disease during today's visit Other  [(L) MCA CVA]  General Interventions   General Interventions Discussed/Reviewed General Interventions Discussed, Durable Medical Equipment (DME), Doctor Visits  Doctor Visits Discussed/Reviewed Doctor Visits Discussed, PCP, Specialist  Durable Medical Equipment (DME) Other  [confirmed not currently requiring/ using assistive devices for ambulation]  PCP/Specialist Visits Compliance with follow-up visit  Exercise Interventions   Exercise Discussed/Reviewed Exercise Discussed  [outpatient PT- confirmed active with initial visit on 07/11/23]  Nutrition Interventions   Nutrition Discussed/Reviewed Nutrition Discussed  Pharmacy Interventions   Pharmacy Dicussed/Reviewed Pharmacy Topics Discussed  [Full medication review with updating medication list in EHR per patient report]      TOC Interventions Today    Flowsheet Row Most Recent Value  TOC Interventions   TOC Interventions Discussed/Reviewed TOC Interventions Discussed  [Patient declines need for ongoing/ further care management outreach,  declines enrollment in 30-day TOC program,   provided my direct contact information should questions/ concerns/ needs arise post-TOC call]      Total time spent from review to signing of note/ including any care coordination interventions:  43 minutes/ extensive medication review with need for updating multiple medications: outside hospital admission/ discharge  Pls call/ message for questions,  Caryl Pina, RN, BSN, Media planner  Transitions of Care  [facility] - [facility] [phone]: direct office

## 2023-07-10 NOTE — Progress Notes (Signed)
 Subjective:   Brandon Carroll is a 63 y.o. who presents for a Medicare Wellness preventive visit.  Visit Complete: In person  AWV Questionnaire: No: Patient Medicare AWV questionnaire was not completed prior to this visit.  Cardiac Risk Factors include: advanced age (>4men, >75 women);diabetes mellitus;dyslipidemia;hypertension;male gender     Objective:    Today's Vitals   07/10/23 0802  BP: 138/80  Pulse: 68  SpO2: 97%  Weight: 178 lb 9.6 oz (81 kg)  Height: 5\' 7"  (1.702 m)   Body mass index is 27.97 kg/m.     07/10/2023    7:57 AM 04/28/2019    6:39 AM 05/26/2018   11:21 AM 05/22/2018    7:50 PM 06/25/2017   10:18 AM 07/05/2015   11:08 AM 06/15/2015    7:54 AM  Advanced Directives  Does Patient Have a Medical Advance Directive? No No No No No No No  Would patient like information on creating a medical advance directive? Yes (MAU/Ambulatory/Procedural Areas - Information given) No - Patient declined No - Patient declined  No - Patient declined  No - patient declined information    Current Medications (verified) Outpatient Encounter Medications as of 07/10/2023  Medication Sig   albuterol (PROVENTIL) (2.5 MG/3ML) 0.083% nebulizer solution Take 3 mLs (2.5 mg total) by nebulization every 6 (six) hours as needed for wheezing or shortness of breath.   aspirin 81 MG chewable tablet Chew 81 mg by mouth once.   carvedilol (COREG) 6.25 MG tablet Take 6.25 mg by mouth 2 (two) times daily with a meal.   clopidogrel (PLAVIX) 75 MG tablet Take 1 tablet by mouth daily.   Continuous Blood Gluc Receiver (FREESTYLE LIBRE 2 READER) DEVI 1 each by Other route See admin instructions. Glucose check   Continuous Blood Gluc Sensor (FREESTYLE LIBRE 14 DAY SENSOR) MISC Apply topically every 14 (fourteen) days.   empagliflozin (JARDIANCE) 10 MG TABS tablet Take 1 tablet by mouth daily.   hydrALAZINE (APRESOLINE) 25 MG tablet Take 1 tablet (25 mg total) by mouth 3 (three) times daily. 1rst  attempt, patient needs and appt for additional refills   Insulin Lispro w/ Trans Port 100 UNIT/ML SOPN Inject 5 Units into the skin 3 (three) times daily.   isosorbide mononitrate (IMDUR) 120 MG 24 hr tablet Take 1 tablet (120 mg total) by mouth daily.   latanoprost (XALATAN) 0.005 % ophthalmic solution Place 1 drop into both eyes at bedtime.   magnesium oxide (MAG-OX) 400 MG tablet Take 2 tablets by mouth daily.   mycophenolate (MYFORTIC) 180 MG EC tablet Take 2 tablets by mouth 2 (two) times daily.   NIFEdipine (PROCARDIA-XL/NIFEDICAL-XL) 30 MG 24 hr tablet Take 2 tablets by mouth daily.   predniSONE (DELTASONE) 5 MG tablet Take 5 mg by mouth daily with breakfast.   rosuvastatin (CRESTOR) 20 MG tablet Take 1 tablet by mouth daily.   sulfamethoxazole-trimethoprim (BACTRIM) 400-80 MG tablet Take 1 tablet by mouth 3 (three) times a week.   tacrolimus (PROGRAF) 1 MG capsule Take 3 mg by mouth 2 (two) times daily.   TRESIBA FLEXTOUCH 200 UNIT/ML FlexTouch Pen ADMINISTER UP TO 40 UNITS UNDER THE SKIN EVERY DAY (Patient taking differently: 36 Units daily. ADMINISTER UP TO 40 UNITS UNDER THE SKIN EVERY DAY)   valGANciclovir (VALCYTE) 450 MG tablet Take 450 mg by mouth daily.   [DISCONTINUED] aspirin 81 MG EC tablet Take 1 tablet by mouth daily. chewable   No facility-administered encounter medications on file as of 07/10/2023.  Allergies (verified) Lipitor [atorvastatin]   History: Past Medical History:  Diagnosis Date   A-V fistula (HCC) 03/12/2019   Abnormal CT of the chest 06/08/2015   Abnormal echocardiogram 04/28/2019   Acute systolic CHF (congestive heart failure), NYHA class 2 (HCC) 05/04/2015   Acute-on-chronic kidney injury (HCC) 05/04/2015   Anemia 06/06/2015   Anemia in chronic kidney disease 11/18/2018   Body mass index (BMI) 27.0-27.9, adult 10/20/2018   Chronic systolic CHF (congestive heart failure) (HCC) 06/15/2015   Coronary artery disease 02/17/2018   Cardiac catheterization  2017 showing 10% LAD and 10% RCA Echocardiogram and stress test done in December 2019 showed evidence of old small inferior lateral myocardial infarction   Diabetes (HCC) 06/20/2019   Diabetes mellitus    Diarrhea, unspecified 10/22/2018   Dyspnea    Encounter for general adult medical examination with abnormal findings 06/12/2019   Erectile dysfunction    ESRD (end stage renal disease) on dialysis [facility])    Essential hypertension 07/03/2016   Fluid overload, unspecified 01/26/2019   Hyperlipidemia    Hyperlipidemia associated with type 2 diabetes mellitus (HCC) 09/18/2011   Started statin therapy 09/2011.  Anticipate improvement as glucose control improves.    Hypertension    Iron deficiency anemia, unspecified 11/03/2018   Low testosterone 10/04/2015   12/01/2015 visit with Jerilee Field, MD at [facility].    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   Secondary hyperparathyroidism (HCC) 10/20/2018   Past Surgical History:  Procedure Laterality Date   AV FISTULA PLACEMENT Left 05/26/2018   Procedure: ARTERIOVENOUS (AV) FISTULA CREATION LEFT ARM;  Surgeon: Cephus Shelling, MD;  Location: [facility] OR;  Service: Vascular;  Laterality: Left;   CARDIAC CATHETERIZATION N/A 06/15/2015   Procedure: Left Heart Cath and Coronary Angiography;  Surgeon: Peter M Swaziland, MD;  Location: [facility] INVASIVE CV LAB;  Service: Cardiovascular;  Laterality: N/A;   CHOLECYSTECTOMY N/A 06/28/2017   Procedure: LAPAROSCOPIC CHOLECYSTECTOMY;  Surgeon: Axel Filler, MD;  Location: [facility] ORS;  Service: General;  Laterality: N/A;   HERNIA REPAIR  7th grade   umbilical   LEFT HEART CATH AND CORONARY ANGIOGRAPHY N/A 04/28/2019   Procedure: LEFT HEART CATH AND CORONARY  ANGIOGRAPHY;  Surgeon: Marykay Lex, MD;  Location: [facility] INVASIVE CV LAB;  Service: Cardiovascular;  Laterality: N/A;   Left Kidney Transplant  07/12/2020   Family History  Problem Relation Age of Onset   Arthritis Mother    COPD Mother    Diabetes Mother        was thin, took insulin   Hypertension Mother    Hypertension Father    Arthritis Sister        rheumatoid   COPD Sister    Diabetes Brother    Colon cancer Neg Hx    Social History   Socioeconomic History   Marital status: Divorced    Spouse name: n/a   Number of children: 1   Years of education: 12+   Highest education level: Not on file  Occupational History   Occupation: Event organiser: [employer]  Tobacco Use   Smoking status: Never    Passive exposure: Never   Smokeless tobacco: Never  Vaping Use   Vaping status: Never Used  Substance and Sexual Activity   Alcohol use: No  Alcohol/week: 0.0 standard drinks of alcohol   Drug use: No   Sexual activity: Not Currently    Comment: Erectile dysfunciotn  Other Topics Concern   Not on file  Social History Narrative   Divorced. Education: [employer].    Unable to exercise due to significant SOB and DOE.    Lives alone.   Son lives in [place].   Social Drivers of Corporate investment banker Strain: Low Risk  (07/10/2023)   Overall Financial Resource Strain (CARDIA)    Difficulty of Paying Living Expenses: Not hard at all  Food Insecurity: No Food Insecurity (07/10/2023)   Hunger Vital Sign    Worried About Running Out of Food in the Last Year: Never true    Ran Out of Food in the Last Year: Never true  Transportation Needs: No Transportation Needs (07/10/2023)   PRAPARE - Administrator, Civil Service (Medical): No    Lack of Transportation (Non-Medical): No  Physical Activity: Insufficiently Active (07/10/2023)   Exercise Vital Sign    Days of Exercise per Week: 3 days    Minutes of Exercise per Session: 20 min  Stress: Stress  Concern Present (07/10/2023)   [employer] of Occupational Health - Occupational Stress Questionnaire    Feeling of Stress : To some extent  Social Connections: Socially Isolated (07/10/2023)   Social Connection and Isolation Panel [NHANES]    Frequency of Communication with Friends and Family: Twice a week    Frequency of Social Gatherings with Friends and Family: Once a week    Attends Religious Services: Never    Database administrator or Organizations: No    Attends Banker Meetings: Never    Marital Status: Divorced    Tobacco Counseling - Non-Carroll Counseling given - N/A    Clinical Intake: Completed 07/10/2023  Pre-visit preparation completed: Yes  Pain : No/denies pain     BMI - recorded: 27.97 Nutritional Status: BMI 25 -29 Overweight Nutritional Risks: None Diabetes: Yes CBG done?: No Did pt. bring in CBG monitor from home?: No  How often do you need to have someone help you when you read instructions, pamphlets, or other written materials from your doctor or pharmacy?: 1 - Never  Interpreter Needed?: No  Information entered by :: Hassell Halim, CMA   Activities of Daily Living Completed 07/10/2023    07/10/2023    8:05 AM  In your present state of health, do you have any difficulty performing the following activities:  Hearing? 0  Vision? 0  Difficulty concentrating or making decisions? 0  Walking or climbing stairs? 0  Dressing or bathing? 0  Doing errands, shopping? 0  Preparing Food and eating ? N  Using the Toilet? N  In the past six months, have you accidently leaked urine? N  Do you have problems with loss of bowel control? N  Managing your Medications? N  Managing your Finances? N  Housekeeping or managing your Housekeeping? N    Patient Care Team: Myrlene Broker, MD as PCP - General (Internal Medicine) Michele Mcalpine, MD as Consulting Physician (Pulmonary Disease) Jerilee Field, MD as Consulting Physician  (Urology) Chilton Si, MD as Attending Physician (Cardiology) Romero Belling, MD (Inactive) as Consulting Physician (Endocrinology) Bufford Buttner, MD as Consulting Physician (Nephrology)  Indicate any recent Medical Services you may have received from other than [facility] providers in the past year (date may be approximate).     Assessment:   This is a routine  wellness examination for Brandon Carroll.  Hearing/Vision screen Hearing Screening - Comments:: Denies hearing difficulties   Vision Screening - Comments:: Wears rx glasses - up to date with routine eye exams per patient stated today.    Goals Addressed               This Visit's Progress     Patient Stated (pt-stated)        Patient stated he plans to continue watching diet and stay out hospital.       Depression Screen Completed 07/10/2023    07/10/2023    8:08 AM 09/07/2022    8:53 AM 08/06/2022    1:31 PM 01/27/2021    8:53 AM 06/12/2019    1:28 PM 03/21/2017    9:07 AM 12/29/2015   10:28 AM  PHQ 2/9 Scores  PHQ - 2 Score 0 0 1 0 0 0 0  PHQ- 9 Score  1 1        Fall Risk Completed 07/10/2023    07/10/2023    8:09 AM 09/07/2022    8:53 AM 08/06/2022    1:31 PM 03/21/2017    9:07 AM 12/29/2015   10:28 AM  Fall Risk   Falls in the past year? 1 1 0 No No  Comment fell due to loss of feeling on right side - went to hospital (no fracture/injury)      Number falls in past yr: 1 0 0    Injury with Fall? 0 0 0    Risk for fall due to : Impaired balance/gait No Fall Risks     Follow up Falls prevention discussed;Falls evaluation completed Falls evaluation completed       MEDICARE RISK AT HOME: Completed 07/10/2023 Medicare Risk at Home Any stairs in or around the home?: No If so, are there any without handrails?: No Home free of loose throw rugs in walkways, pet beds, electrical cords, etc?: Yes Adequate lighting in your home to reduce risk of falls?: Yes Life alert?: No Use of a cane, walker or w/c?: No Grab bars in  the bathroom?: Yes Shower chair or bench in shower?: No Elevated toilet seat or a handicapped toilet?: No  TIMED UP AND GO:  Was the test performed?  No  Cognitive Function: 6CIT completed        07/10/2023    8:11 AM  6CIT Screen  What Year? 0 points  What month? 0 points  What time? 0 points  Count back from 20 0 points  Months in reverse 0 points  Repeat phrase 0 points  Total Score 0 points    Immunizations Immunization History  Administered Date(s) Administered   Fluad Quad(high Dose 65+) 01/27/2021   Hepatitis B, ADULT 11/07/2018, 12/05/2018, 01/09/2019, 05/18/2019   Influenza Inj Mdck Quad Pf 02/16/2019   Influenza, Seasonal, Injecte, Preservative Fre 07/22/2012   Influenza,inj,Quad PF,6+ Mos 08/04/2014, 07/02/2016, 03/21/2017, 02/09/2019, 03/09/2020, 06/27/2022   Moderna Sars-Covid-2 Vaccination 07/24/2019, 08/21/2019   Pneumococcal Conjugate-13 11/05/2018   Pneumococcal Polysaccharide-23 07/22/2012, 05/03/2015   Tdap 05/14/2010    Screening Tests Health Maintenance  Topic Date Due   Zoster Vaccines- Shingrix (1 of 2) Never done   Diabetic kidney evaluation - Urine ACR  03/21/2018   COVID-19 Vaccine (3 - Moderna risk series) 09/18/2019   DTaP/Tdap/Td (2 - Td or Tdap) 05/14/2020   HEMOGLOBIN A1C  06/23/2022   INFLUENZA VACCINE  12/13/2022   OPHTHALMOLOGY EXAM  01/09/2023   Diabetic kidney evaluation - eGFR measurement  03/12/2024  FOOT EXAM  06/16/2024   Medicare Annual Wellness (AWV)  07/09/2024   Pneumococcal Vaccine 47-104 Years old (4 of 4 - PPSV23 or PCV20) 05/11/2028   Colonoscopy  06/22/2029   Hepatitis C Screening  Completed   HIV Screening  Completed   HPV VACCINES  Aged Out    Health Maintenance  Health Maintenance Due  Topic Date Due   Zoster Vaccines- Shingrix (1 of 2) Never done   Diabetic kidney evaluation - Urine ACR  03/21/2018   COVID-19 Vaccine (3 - Moderna risk series) 09/18/2019   DTaP/Tdap/Td (2 - Td or Tdap) 05/14/2020    HEMOGLOBIN A1C  06/23/2022   INFLUENZA VACCINE  12/13/2022   OPHTHALMOLOGY EXAM  01/09/2023   Health Maintenance Items Addressed: 07/10/2023. Pt declines to get COVID, Influenza, and Tdap vaccines.  Educated and advised to get at [facility].  Foot Exam status: Completed 06/17/2023 by PCP. Pt. does have an appt w/Podiatrist, Dr Geralynn Rile 09/2023.  Colonoscopy status: Completed 06/23/2019. Due in 10 years.    Additional Screening:  Vision Screening: Recommended annual ophthalmology exams for early detection of glaucoma and other disorders of the eye. Does have annual eye exam with an Ophthalmologist (pt unable to remember name). Next appt in 2025.  Dental Screening: Recommended annual dental exams for proper oral hygiene.  Pt stated he has regular (6-mth) appts with dentist. Next appt in 2025.  Community Resource Referral / Chronic Care Management: CRR required this visit?  No   CCM required this visit?  No     Plan:     I have personally reviewed and noted the following in the patient's chart:   Medical and social history Use of alcohol, tobacco or illicit drugs  Current medications and supplements including opioid prescriptions. Patient is not currently taking opioid prescriptions. Functional ability and status Nutritional status Physical activity Advanced directives List of other physicians Hospitalizations, surgeries, and ER visits in previous 12 months Vitals Screenings to include cognitive, depression, and falls Referrals and appointments  In addition, I have reviewed and discussed with patient certain preventive protocols, quality metrics, and best practice recommendations. A written personalized care plan for preventive services as well as general preventive health recommendations were provided to patient.     Darreld Mclean, CMA   07/10/2023   After Visit Summary: (MyChart) Due to this being a telephonic visit, the after visit summary with patients  personalized plan was offered to patient via MyChart   Notes:  Pt has just been discharged from [facility].  Will have him to schedule a hosp f/u with Dr Okey Dupre within 1-2wks in addition to a physical appt in 08/2023.

## 2023-07-11 DIAGNOSIS — I63512 Cerebral infarction due to unspecified occlusion or stenosis of left middle cerebral artery: Secondary | ICD-10-CM | POA: Diagnosis not present

## 2023-07-11 DIAGNOSIS — R209 Unspecified disturbances of skin sensation: Secondary | ICD-10-CM | POA: Diagnosis not present

## 2023-07-11 DIAGNOSIS — Z7409 Other reduced mobility: Secondary | ICD-10-CM | POA: Diagnosis not present

## 2023-07-11 DIAGNOSIS — R531 Weakness: Secondary | ICD-10-CM | POA: Diagnosis not present

## 2023-07-23 ENCOUNTER — Encounter: Payer: Self-pay | Admitting: Internal Medicine

## 2023-07-23 ENCOUNTER — Ambulatory Visit (INDEPENDENT_AMBULATORY_CARE_PROVIDER_SITE_OTHER): Payer: 59 | Admitting: Internal Medicine

## 2023-07-23 VITALS — BP 120/82 | HR 45 | Temp 98.3°F | Ht 67.0 in | Wt 179.0 lb

## 2023-07-23 DIAGNOSIS — I5022 Chronic systolic (congestive) heart failure: Secondary | ICD-10-CM

## 2023-07-23 DIAGNOSIS — Z8673 Personal history of transient ischemic attack (TIA), and cerebral infarction without residual deficits: Secondary | ICD-10-CM

## 2023-07-23 NOTE — Progress Notes (Unsigned)
   Subjective:   Patient ID: Brandon Carroll, male    DOB: August 25, 1960, 63 y.o.   MRN: [id number]  HPI The patient is a 63 YO man coming in for hospital follow up (admitted at [facility] for stroke, treated with carotid artery intervention). He is doing well since being home. Walking almost normally, denies impaired sensation. Overall feels lucky. Doing ot still pt released. He denies new stroke symptoms. Eating well and no coughing or problems with swallowing or speech.   PMH, [facility], social history reviewed and updated  Review of Systems  Constitutional: Negative.   HENT: Negative.    Eyes: Negative.   Respiratory:  Negative for cough, chest tightness and shortness of breath.   Cardiovascular:  Negative for chest pain, palpitations and leg swelling.  Gastrointestinal:  Negative for abdominal distention, abdominal pain, constipation, diarrhea, nausea and vomiting.  Musculoskeletal: Negative.   Skin: Negative.   Neurological: Negative.   Psychiatric/Behavioral: Negative.      Objective:  Physical Exam Constitutional:      Appearance: He is well-developed.  HENT:     Head: Normocephalic and atraumatic.  Cardiovascular:     Rate and Rhythm: Normal rate and regular rhythm.  Pulmonary:     Effort: Pulmonary effort is normal. No respiratory distress.     Breath sounds: Normal breath sounds. No wheezing or rales.  Abdominal:     General: Bowel sounds are normal. There is no distension.     Palpations: Abdomen is soft.     Tenderness: There is no abdominal tenderness. There is no rebound.  Musculoskeletal:     Cervical back: Normal range of motion.  Skin:    General: Skin is warm and dry.  Neurological:     Mental Status: He is alert and oriented to person, place, and time.     Coordination: Coordination normal.     Vitals:   07/23/23 1048  BP: 120/82  Pulse: (!) 45  Temp: 98.3 F (36.8 C)  TempSrc: Oral  SpO2: 98%  Weight: 179 lb (81.2 kg)  Height: 5\' 7"  (1.702 m)     Assessment & Plan:  Visit time 25 minutes in face to face communication with patient and coordination of care, additional 10 minutes spent in record review, coordination or care, ordering tests, communicating/referring to other healthcare professionals, documenting in medical records all on the same day of the visit for total time 35 minutes spent on the visit.

## 2023-07-25 DIAGNOSIS — R531 Weakness: Secondary | ICD-10-CM | POA: Diagnosis not present

## 2023-07-25 DIAGNOSIS — Z7409 Other reduced mobility: Secondary | ICD-10-CM | POA: Diagnosis not present

## 2023-07-25 DIAGNOSIS — R209 Unspecified disturbances of skin sensation: Secondary | ICD-10-CM | POA: Diagnosis not present

## 2023-07-25 DIAGNOSIS — I639 Cerebral infarction, unspecified: Secondary | ICD-10-CM | POA: Diagnosis not present

## 2023-07-25 DIAGNOSIS — R278 Other lack of coordination: Secondary | ICD-10-CM | POA: Diagnosis not present

## 2023-07-25 DIAGNOSIS — I63512 Cerebral infarction due to unspecified occlusion or stenosis of left middle cerebral artery: Secondary | ICD-10-CM | POA: Diagnosis not present

## 2023-07-25 DIAGNOSIS — R29898 Other symptoms and signs involving the musculoskeletal system: Secondary | ICD-10-CM | POA: Diagnosis not present

## 2023-07-26 DIAGNOSIS — Z8673 Personal history of transient ischemic attack (TIA), and cerebral infarction without residual deficits: Secondary | ICD-10-CM | POA: Insufficient documentation

## 2023-07-26 NOTE — Assessment & Plan Note (Signed)
 Doing well and still in OT, dismissed from PT due to high function. He is taking aspirin and plavix and will follow up with neurology. BP is at goal and diabetes controlled.

## 2023-07-26 NOTE — Assessment & Plan Note (Signed)
 No flare today and taking coreg and imdur and aspirin and plavix. Continue.

## 2023-07-29 DIAGNOSIS — E1129 Type 2 diabetes mellitus with other diabetic kidney complication: Secondary | ICD-10-CM | POA: Diagnosis not present

## 2023-07-29 DIAGNOSIS — N189 Chronic kidney disease, unspecified: Secondary | ICD-10-CM | POA: Diagnosis not present

## 2023-07-29 DIAGNOSIS — Z940 Kidney transplant status: Secondary | ICD-10-CM | POA: Diagnosis not present

## 2023-07-29 DIAGNOSIS — N390 Urinary tract infection, site not specified: Secondary | ICD-10-CM | POA: Diagnosis not present

## 2023-08-09 ENCOUNTER — Encounter: Payer: 59 | Admitting: Internal Medicine

## 2023-08-12 ENCOUNTER — Encounter: Admitting: Internal Medicine

## 2023-08-20 DIAGNOSIS — I5022 Chronic systolic (congestive) heart failure: Secondary | ICD-10-CM | POA: Diagnosis not present

## 2023-08-20 DIAGNOSIS — D869 Sarcoidosis, unspecified: Secondary | ICD-10-CM | POA: Diagnosis not present

## 2023-08-20 DIAGNOSIS — N1831 Chronic kidney disease, stage 3a: Secondary | ICD-10-CM | POA: Diagnosis not present

## 2023-09-10 DIAGNOSIS — Z794 Long term (current) use of insulin: Secondary | ICD-10-CM | POA: Diagnosis not present

## 2023-09-10 DIAGNOSIS — E1121 Type 2 diabetes mellitus with diabetic nephropathy: Secondary | ICD-10-CM | POA: Diagnosis not present

## 2023-09-11 DIAGNOSIS — I639 Cerebral infarction, unspecified: Secondary | ICD-10-CM | POA: Diagnosis not present

## 2023-09-11 DIAGNOSIS — J449 Chronic obstructive pulmonary disease, unspecified: Secondary | ICD-10-CM | POA: Diagnosis not present

## 2023-09-11 DIAGNOSIS — D631 Anemia in chronic kidney disease: Secondary | ICD-10-CM | POA: Diagnosis not present

## 2023-09-11 DIAGNOSIS — D869 Sarcoidosis, unspecified: Secondary | ICD-10-CM | POA: Diagnosis not present

## 2023-09-11 DIAGNOSIS — I129 Hypertensive chronic kidney disease with stage 1 through stage 4 chronic kidney disease, or unspecified chronic kidney disease: Secondary | ICD-10-CM | POA: Diagnosis not present

## 2023-09-11 DIAGNOSIS — E1129 Type 2 diabetes mellitus with other diabetic kidney complication: Secondary | ICD-10-CM | POA: Diagnosis not present

## 2023-09-11 DIAGNOSIS — Z940 Kidney transplant status: Secondary | ICD-10-CM | POA: Diagnosis not present

## 2023-09-11 DIAGNOSIS — Z8619 Personal history of other infectious and parasitic diseases: Secondary | ICD-10-CM | POA: Diagnosis not present

## 2023-09-11 DIAGNOSIS — I5022 Chronic systolic (congestive) heart failure: Secondary | ICD-10-CM | POA: Diagnosis not present

## 2023-09-18 ENCOUNTER — Ambulatory Visit (INDEPENDENT_AMBULATORY_CARE_PROVIDER_SITE_OTHER): Payer: 59 | Admitting: Podiatry

## 2023-09-18 ENCOUNTER — Encounter: Payer: Self-pay | Admitting: Podiatry

## 2023-09-18 VITALS — Ht 67.0 in | Wt 179.0 lb

## 2023-09-18 DIAGNOSIS — B351 Tinea unguium: Secondary | ICD-10-CM | POA: Diagnosis not present

## 2023-09-18 DIAGNOSIS — Z940 Kidney transplant status: Secondary | ICD-10-CM | POA: Diagnosis not present

## 2023-09-18 DIAGNOSIS — Z794 Long term (current) use of insulin: Secondary | ICD-10-CM | POA: Diagnosis not present

## 2023-09-18 DIAGNOSIS — L84 Corns and callosities: Secondary | ICD-10-CM | POA: Diagnosis not present

## 2023-09-18 DIAGNOSIS — E1121 Type 2 diabetes mellitus with diabetic nephropathy: Secondary | ICD-10-CM

## 2023-09-18 DIAGNOSIS — M79676 Pain in unspecified toe(s): Secondary | ICD-10-CM

## 2023-09-22 NOTE — Progress Notes (Signed)
  Subjective:  Patient ID: Brandon Carroll, male    DOB: 19-Oct-1960,  MRN: [id number]  Brandon Carroll presents to clinic today for at risk foot care with h/o NIDDM, is s/p kidney transplant and is on chronic immunosuppressive therapy.  Patient is seen today for and callus(es) of both feet and painful mycotic toenails that are difficult to trim. Painful toenails interfere with ambulation. Aggravating factors include wearing enclosed shoe gear. Pain is relieved with periodic professional debridement. Painful calluses are aggravated when weightbearing with and without shoegear. Pain is relieved with periodic professional debridement. Patinet states he has had a stroke since his last visit. States he has no physical residual deficits. Chief Complaint  Patient presents with   Nail Problem    Pt is here for [facility] last A1C was 7.2 PCP is Dr Nicolette Barrio and LOV was in March.   New problem(s): None.   PCP is Adelia Homestead, MD.  Allergies  Allergen Reactions   Lipitor [Atorvastatin ] Hives and Itching    Review of Systems: Negative except as noted in the HPI.  Objective:  There were no vitals filed for this visit. Brandon Carroll is a pleasant 63 y.o. male WD, WN in NAD. AAO x 3.  Vascular Examination: Capillary refill time immediate b/l. Vascular status intact b/l with palpable pedal pulses. Pedal hair present b/l. No pain with calf compression b/l. Skin temperature gradient WNL b/l. No cyanosis or clubbing b/l. No ischemia or gangrene noted b/l. No edema b/l.  Neurological Examination: Sensation grossly intact b/l with 10 gram monofilament. Vibratory sensation intact b/l.   Dermatological Examination: Pedal skin with normal turgor, texture and tone b/l.  No open wounds. No interdigital macerations.   Toenails 1-5 b/l thick, discolored, elongated with subungual debris and pain on dorsal palpation.  Hyperkeratotic lesion(s) submet head 5 left foot and sub 5th met base b/l lower  extremities.  No erythema, no edema, no drainage, no fluctuance.  Musculoskeletal Examination: Muscle strength 5/5 to all lower extremity muscle groups bilaterally. 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  Radiographs: None  Assessment/Plan: 1. Pain due to onychomycosis of toenail   2. Callus   3. Deceased-donor kidney transplant   4. Type 2 diabetes mellitus with diabetic nephropathy, with long-term current use of insulin  Brandon Carroll)     Patient was evaluated and treated. All patient's and/or POA's questions/concerns addressed on today's visit. Toenails 1-5 debrided in length and girth without incident. Callus(es) submet head 5 left foot and sub 5th met base b/l lower extremities pared with sharp debridement without incident. Continue daily foot inspections and monitor blood glucose per PCP/Endocrinologist's recommendations. Continue soft, supportive shoe gear daily. Report any pedal injuries to medical professional. Call office if there are any questions/concerns. Return in about 3 months (around 12/19/2023).  Brandon Carroll, DPM      [facility] LOCATION: [address] [place] [address]                   Office [phone]   [facility] LOCATION: [address] [place], [place] [address] Office [phone]

## 2023-09-23 ENCOUNTER — Encounter (HOSPITAL_COMMUNITY): Payer: Self-pay

## 2023-09-30 ENCOUNTER — Ambulatory Visit (INDEPENDENT_AMBULATORY_CARE_PROVIDER_SITE_OTHER): Admitting: Internal Medicine

## 2023-09-30 ENCOUNTER — Ambulatory Visit (INDEPENDENT_AMBULATORY_CARE_PROVIDER_SITE_OTHER)

## 2023-09-30 ENCOUNTER — Encounter: Payer: Self-pay | Admitting: Internal Medicine

## 2023-09-30 VITALS — BP 124/80 | HR 77 | Temp 98.4°F | Ht 67.0 in | Wt 172.0 lb

## 2023-09-30 DIAGNOSIS — M25512 Pain in left shoulder: Secondary | ICD-10-CM | POA: Insufficient documentation

## 2023-09-30 DIAGNOSIS — M778 Other enthesopathies, not elsewhere classified: Secondary | ICD-10-CM | POA: Diagnosis not present

## 2023-09-30 DIAGNOSIS — R21 Rash and other nonspecific skin eruption: Secondary | ICD-10-CM | POA: Insufficient documentation

## 2023-09-30 NOTE — Patient Instructions (Signed)
 We will do an x-ray of the shoulder and recommend to take the valganciclovir for a few days to help the rash on the ear

## 2023-09-30 NOTE — Assessment & Plan Note (Signed)
 Could have been shingles asked to take 4-5 days of valganacyclovir he has at home as he is immunosuppressed.

## 2023-09-30 NOTE — Progress Notes (Signed)
   Subjective:   Patient ID: Brandon Carroll, male    DOB: [birth year], 63 y.o.   MRN: [id number]  HPI The patient is a 63 YO man coming in for arthritis in the neck and shoulder. Also has a rash which was blistering and painful on left ear. Is no longer taking valganacyclovir for some time.   Review of Systems  Constitutional: Negative.   HENT: Negative.    Eyes: Negative.   Respiratory:  Negative for cough, chest tightness and shortness of breath.   Cardiovascular:  Negative for chest pain, palpitations and leg swelling.  Gastrointestinal:  Negative for abdominal distention, abdominal pain, constipation, diarrhea, nausea and vomiting.  Musculoskeletal:  Positive for arthralgias and myalgias.  Skin: Negative.   Neurological: Negative.   Psychiatric/Behavioral: Negative.      Objective:  Physical Exam Constitutional:      Appearance: He is well-developed.  HENT:     Head: Normocephalic and atraumatic.  Cardiovascular:     Rate and Rhythm: Normal rate and regular rhythm.  Pulmonary:     Effort: Pulmonary effort is normal. No respiratory distress.     Breath sounds: Normal breath sounds. No wheezing or rales.  Abdominal:     General: Bowel sounds are normal. There is no distension.     Palpations: Abdomen is soft.     Tenderness: There is no abdominal tenderness. There is no rebound.  Musculoskeletal:        General: Tenderness present.     Cervical back: Normal range of motion.  Skin:    General: Skin is warm and dry.     Findings: Rash present.     Comments: Rash with skin breakdown on the left ear could be healing shingles  Neurological:     Mental Status: He is alert and oriented to person, place, and time.     Coordination: Coordination normal.     Vitals:   09/30/23 1326  BP: 124/80  Pulse: 77  Temp: 98.4 F (36.9 C)  TempSrc: Oral  SpO2: 98%  Weight: 172 lb (78 kg)  Height: 5\' 7"  (1.702 m)    Assessment & Plan:

## 2023-09-30 NOTE — Assessment & Plan Note (Signed)
 Ordered x-ray and refer to sports medicine.

## 2023-10-03 NOTE — Progress Notes (Signed)
 Brandon Carroll D.[facility] [address] [place] [address] Phone: [phone]   Assessment and Plan:     1. Acute pain of left shoulder 2. Subacromial bursitis of left shoulder joint  -Acute, initial visit - Most consistent with subacromial bursitis of left shoulder, likely flared due to side sleeping - Reviewed x-ray with patient.  Discussed no acute fracture or dislocation.  Mild AC joint degenerative changes - Discussed I do not recommend NSAIDs or higher p.o. prednisone  doses due to past medical history including kidney transplant, CHF, hypertension, DM type II, antiplatelet therapy on Plavix, immunosuppression with chronic prednisone  use - Patient elected for subacromial CSI.  Tolerated well per note below.  CSI may temporarily increase blood glucose in patient with past medical history of DM type II  Procedure: Subacromial Injection Side: Left  Risks explained and consent was given verbally. The site was cleaned with alcohol prep. A steroid injection was performed from posterior approach using 2mL of 1% lidocaine  without epinephrine and 1mL of kenalog 40mg /ml. This was well tolerated and resulted in symptomatic relief.  Needle was removed, hemostasis achieved, and post injection instructions were explained.   Pt was advised to call or return to clinic if these symptoms worsen or fail to improve as anticipated.   15 additional minutes spent for educating Therapeutic Home Exercise Program.  This included exercises focusing on stretching, strengthening, with focus on eccentric aspects.   Long term goals include an improvement in range of motion, strength, endurance as well as avoiding reinjury. Patient's frequency would include in 1-2 times a day, 3-5 times a week for a duration of 6-12 weeks. Proper technique shown and discussed handout in great detail with ATC.  All questions were discussed and answered.    Pertinent previous records reviewed  include left shoulder x-ray 09/30/2023  Follow Up: 3 to 4 weeks for reevaluation.  If no improvement or worsening of symptoms, could consider physical therapy   Subjective:   I, Brandon Carroll, am serving as a Neurosurgeon for Doctor Ulysees Gander  Chief Complaint: left shoulder pain   HPI:   10/04/2023 Patient is a 63 year old male with left shoulder pain. Patient states pain for about a month. No radiating pain. Decreased ROM. No numbness or tingling. Tylenol  for the pain does not help.    Relevant Historical Information: kidney transplant, CHF, hypertension, DM type II, antiplatelet therapy on Plavix, immunosuppression with chronic prednisone  use  Additional pertinent review of systems negative.   Current Outpatient Medications:    albuterol  (PROVENTIL ) (2.5 MG/3ML) 0.083% nebulizer solution, Take 3 mLs (2.5 mg total) by nebulization every 6 (six) hours as needed for wheezing or shortness of breath., Disp: 75 mL, Rfl: 5   aspirin  81 MG chewable tablet, Chew 81 mg by mouth once., Disp: , Rfl:    carvedilol  (COREG ) 6.25 MG tablet, Take 6.25 mg by mouth 2 (two) times daily with a meal., Disp: , Rfl:    clopidogrel (PLAVIX) 75 MG tablet, Take 1 tablet by mouth daily., Disp: , Rfl:    Continuous Blood Gluc Receiver (FREESTYLE LIBRE 2 READER) DEVI, 1 each by Other route See admin instructions. Glucose check, Disp: , Rfl:    Continuous Blood Gluc Sensor (FREESTYLE LIBRE 14 DAY SENSOR) MISC, Apply topically every 14 (fourteen) days., Disp: , Rfl:    empagliflozin (JARDIANCE) 10 MG TABS tablet, Take 1 tablet by mouth daily., Disp: , Rfl:    hydrALAZINE  (APRESOLINE ) 25 MG tablet, Take  1 tablet (25 mg total) by mouth 3 (three) times daily. 1rst attempt, patient needs and appt for additional refills, Disp: 270 tablet, Rfl: 0   insulin  lispro (HUMALOG ) 100 UNIT/ML injection, Inject 12 Units into the skin 3 (three) times daily with meals. 07/10/23: Reports during TOC call, he was prescribed this new  insulin  at time of hospital discharge on 07/08/23- verified was not on his current medication list- added today according to patient report- he reports taking 12 U with each meal, TID, Disp: , Rfl:    isosorbide  mononitrate (IMDUR ) 120 MG 24 hr tablet, Take 1 tablet (120 mg total) by mouth daily., Disp: 90 tablet, Rfl: 3   latanoprost (XALATAN) 0.005 % ophthalmic solution, Place 1 drop into both eyes at bedtime., Disp: , Rfl:    magnesium oxide (MAG-OX) 400 MG tablet, Take 2 tablets by mouth daily., Disp: , Rfl:    mycophenolate (MYFORTIC) 180 MG EC tablet, Take 2 tablets by mouth 2 (two) times daily., Disp: , Rfl:    NIFEdipine (PROCARDIA-XL/NIFEDICAL-XL) 30 MG 24 hr tablet, Take 2 tablets by mouth daily., Disp: , Rfl:    predniSONE  (DELTASONE ) 5 MG tablet, Take 5 mg by mouth daily with breakfast., Disp: , Rfl:    rosuvastatin  (CRESTOR ) 20 MG tablet, Take 1 tablet by mouth daily., Disp: , Rfl:    sulfamethoxazole-trimethoprim (BACTRIM) 400-80 MG tablet, Take 1 tablet by mouth 3 (three) times a week., Disp: , Rfl:    tacrolimus  (PROGRAF ) 1 MG capsule, Take 3 mg by mouth 2 (two) times daily., Disp: , Rfl:    torsemide (DEMADEX) 20 MG tablet, Take 20 mg by mouth daily. 07/10/23: Reports during TOC call, he has been taking for "a long time;" verified was not on his current medication list- added today according to patient report, Disp: , Rfl:    TRESIBA  FLEXTOUCH 200 UNIT/ML FlexTouch Pen, ADMINISTER UP TO 40 UNITS UNDER THE SKIN EVERY DAY (Patient taking differently: 36 Units daily. ADMINISTER UP TO 40 UNITS UNDER THE SKIN EVERY DAY), Disp: 9 mL, Rfl: 1   valGANciclovir (VALCYTE) 450 MG tablet, Take 450 mg by mouth daily., Disp: , Rfl:    Objective:     Vitals:   10/04/23 0957  BP: 124/80  Pulse: 74  SpO2: 100%  Weight: 172 lb (78 kg)  Height: 5\' 10"  (1.778 m)      Body mass index is 24.68 kg/m.    Physical Exam:    Gen: Appears well, nad, nontoxic and pleasant Neuro:sensation intact,  strength is 5/5 with df/pf/inv/ev, muscle tone wnl Skin: no suspicious lesion or defmority Psych: A&O, appropriate mood and affect  Left shoulder:  No deformity, swelling or muscle wasting No scapular winging FF 180, abd 180, int 0, ext 90 with painful arc in all directions NTTP over the [place], clavicle, ac, coracoid, biceps groove, humerus, deltoid, trapezius, cervical spine Positive neer, hawkins, empty can, obriens, crossarm, subscap liftoff, Negative speeds Neg ant drawer, sulcus sign, apprehension Negative Spurling's test bilat FROM of neck    Electronically signed by:  Marshall Skeeter D.[facility] 10:27 AM 10/04/23

## 2023-10-04 ENCOUNTER — Ambulatory Visit (INDEPENDENT_AMBULATORY_CARE_PROVIDER_SITE_OTHER): Admitting: Sports Medicine

## 2023-10-04 VITALS — BP 124/80 | HR 74 | Ht 70.0 in | Wt 172.0 lb

## 2023-10-04 DIAGNOSIS — M25512 Pain in left shoulder: Secondary | ICD-10-CM | POA: Diagnosis not present

## 2023-10-04 DIAGNOSIS — M7552 Bursitis of left shoulder: Secondary | ICD-10-CM

## 2023-10-04 NOTE — Patient Instructions (Signed)
 Shoulder HEP 3-4 week follow up

## 2023-10-08 ENCOUNTER — Ambulatory Visit: Payer: Self-pay | Admitting: Internal Medicine

## 2023-10-17 NOTE — Progress Notes (Signed)
 I have mailed results to this patient

## 2023-10-18 DIAGNOSIS — Z95828 Presence of other vascular implants and grafts: Secondary | ICD-10-CM | POA: Diagnosis not present

## 2023-10-18 DIAGNOSIS — I6522 Occlusion and stenosis of left carotid artery: Secondary | ICD-10-CM | POA: Diagnosis not present

## 2023-10-18 DIAGNOSIS — Z7982 Long term (current) use of aspirin: Secondary | ICD-10-CM | POA: Diagnosis not present

## 2023-10-18 DIAGNOSIS — I69351 Hemiplegia and hemiparesis following cerebral infarction affecting right dominant side: Secondary | ICD-10-CM | POA: Diagnosis not present

## 2023-10-18 DIAGNOSIS — I119 Hypertensive heart disease without heart failure: Secondary | ICD-10-CM | POA: Diagnosis not present

## 2023-10-18 DIAGNOSIS — Z940 Kidney transplant status: Secondary | ICD-10-CM | POA: Diagnosis not present

## 2023-10-24 NOTE — Progress Notes (Signed)
 Brandon Carroll D.[facility] [address] [place] [address] Phone: [phone]   Assessment and Plan:     1. Acute pain of left shoulder 2. Subacromial bursitis of left shoulder joint 3. Osteoarthritis of glenohumeral joint, left  -Subacute, subsequent visit - Overall improvement in left shoulder pain, however patient continues to have daily shoulder pain with activity.  Suspect subacromial CSI previous office visit on 10/04/2023 was beneficial and decreased inflammation and rotator cuff, however patient has additional mild degenerative changes in glenohumeral and AC joint.  Suspect remaining pain is likely originating from glenohumeral joint - Patient elected for glenohumeral CSI at today's visit.  Tolerated well per note below.  CSI might temporarily increase blood glucose in patient with past medical history of DM type II - I do not recommend NSAIDs or higher p.o. prednisone  courses due to past medical history of kidney transplant, CHF, hypertension, DM type II, antiplatelet therapy on Plavix, immunosuppression with chronic prednisone  use - Continue HEP and start physical therapy.  Referral sent   Procedure: Ultrasound Guided Glenohumeral Joint Injection Side: Left Diagnosis: Flare of mild glenohumeral osteoarthritis US  Indication:  - accuracy is paramount for diagnosis - to ensure therapeutic efficacy or procedural success - to reduce procedural risk  After explaining the procedure, viable alternatives, risks, and answering any questions, consent was given verbally. The site was cleaned with chlorhexidine  prep. An ultrasound transducer was placed on the posterior shoulder.  The posterior capsule, labrum, and infraspinatus were identified.  A steroid injection was performed under ultrasound guidance with sterile technique using  2ml of 1% lidocaine  without epinephrine and 40 mg of triamcinolone  (KENALOG) 40mg /ml. This was well tolerated and  resulted in  relief.  Needle was removed and dressing placed and post injection instructions were given including  a discussion of likely return of pain today after the anesthetic wears off (with the possibility of worsened pain) until the steroid starts to work in 1-3 days.   Pt was advised to call or return to clinic if these symptoms worsen or fail to improve as anticipated.   Images permanently stored.   Pertinent previous records reviewed include none  Follow Up: 4 weeks for reevaluation.  If no improvement or worsening of symptoms, could consider advanced imaging   Subjective:   I, Brandon Carroll am a scribe for Brandon Carroll.    Chief Complaint: Left shoulder pain   HPI:   10/04/2023 Patient is a 63 year old male with left shoulder pain. Patient states pain for about a month. No radiating pain. Decreased ROM. No numbness or tingling. Tylenol  for the pain does not help.  10/25/2023 Last visit patient has a Subacromial injection. Patient states it is a little better but still feels a little stiff and might need another shot.   Relevant Historical Information: kidney transplant, CHF, hypertension, DM type II, antiplatelet therapy on Plavix, immunosuppression with chronic prednisone  use   Additional pertinent review of systems negative.   Current Outpatient Medications:    albuterol  (PROVENTIL ) (2.5 MG/3ML) 0.083% nebulizer solution, Take 3 mLs (2.5 mg total) by nebulization every 6 (six) hours as needed for wheezing or shortness of breath., Disp: 75 mL, Rfl: 5   aspirin  81 MG chewable tablet, Chew 81 mg by mouth once., Disp: , Rfl:    carvedilol  (COREG ) 6.25 MG tablet, Take 6.25 mg by mouth 2 (two) times daily with a meal., Disp: , Rfl:    clopidogrel (PLAVIX) 75 MG tablet, Take 1  tablet by mouth daily., Disp: , Rfl:    Continuous Blood Gluc Receiver (FREESTYLE LIBRE 2 READER) DEVI, 1 each by Other route See admin instructions. Glucose check, Disp: , Rfl:    Continuous Blood Gluc  Sensor (FREESTYLE LIBRE 14 DAY SENSOR) MISC, Apply topically every 14 (fourteen) days., Disp: , Rfl:    empagliflozin (JARDIANCE) 10 MG TABS tablet, Take 1 tablet by mouth daily., Disp: , Rfl:    hydrALAZINE  (APRESOLINE ) 25 MG tablet, Take 1 tablet (25 mg total) by mouth 3 (three) times daily. 1rst attempt, patient needs and appt for additional refills, Disp: 270 tablet, Rfl: 0   insulin  lispro (HUMALOG ) 100 UNIT/ML injection, Inject 12 Units into the skin 3 (three) times daily with meals. 07/10/23: Reports during TOC call, he was prescribed this new insulin  at time of hospital discharge on 07/08/23- verified was not on his current medication list- added today according to patient report- he reports taking 12 U with each meal, TID, Disp: , Rfl:    isosorbide  mononitrate (IMDUR ) 120 MG 24 hr tablet, Take 1 tablet (120 mg total) by mouth daily., Disp: 90 tablet, Rfl: 3   latanoprost (XALATAN) 0.005 % ophthalmic solution, Place 1 drop into both eyes at bedtime., Disp: , Rfl:    magnesium oxide (MAG-OX) 400 MG tablet, Take 2 tablets by mouth daily., Disp: , Rfl:    mycophenolate (MYFORTIC) 180 MG EC tablet, Take 2 tablets by mouth 2 (two) times daily., Disp: , Rfl:    NIFEdipine (PROCARDIA-XL/NIFEDICAL-XL) 30 MG 24 hr tablet, Take 2 tablets by mouth daily., Disp: , Rfl:    predniSONE  (DELTASONE ) 5 MG tablet, Take 5 mg by mouth daily with breakfast., Disp: , Rfl:    rosuvastatin  (CRESTOR ) 20 MG tablet, Take 1 tablet by mouth daily., Disp: , Rfl:    sulfamethoxazole-trimethoprim (BACTRIM) 400-80 MG tablet, Take 1 tablet by mouth 3 (three) times a week., Disp: , Rfl:    tacrolimus  (PROGRAF ) 1 MG capsule, Take 3 mg by mouth 2 (two) times daily., Disp: , Rfl:    torsemide (DEMADEX) 20 MG tablet, Take 20 mg by mouth daily. 07/10/23: Reports during TOC call, he has been taking for a long time; verified was not on his current medication list- added today according to patient report, Disp: , Rfl:    TRESIBA   FLEXTOUCH 200 UNIT/ML FlexTouch Pen, ADMINISTER UP TO 40 UNITS UNDER THE SKIN EVERY DAY (Patient taking differently: 36 Units daily. ADMINISTER UP TO 40 UNITS UNDER THE SKIN EVERY DAY), Disp: 9 mL, Rfl: 1   valGANciclovir (VALCYTE) 450 MG tablet, Take 450 mg by mouth daily., Disp: , Rfl:    Objective:     Vitals:   10/25/23 0956  BP: (!) 150/70  Pulse: (!) 50  SpO2: 97%  Weight: 169 lb (76.7 kg)  Height: 5' 10 (1.778 m)      Body mass index is 24.25 kg/m.    Physical Exam:    Gen: Appears well, nad, nontoxic and pleasant Neuro:sensation intact, strength is 5/5 with df/pf/inv/ev, muscle tone wnl Skin: no suspicious lesion or defmority Psych: A&O, appropriate mood and affect   Left shoulder:  No deformity, swelling or muscle wasting No scapular winging FF 180, abd 180, int 0, ext 90 with painful arc in all directions NTTP over the [place], clavicle, ac, coracoid, biceps groove, humerus, deltoid, trapezius, cervical spine Positive neer, hawkins, empty can, obriens, crossarm, subscap liftoff, Negative speeds Neg ant drawer, sulcus sign, apprehension Negative Spurling's test bilat FROM of  neck     Electronically signed by:  Marshall Skeeter D.[facility] 11:04 AM 10/25/23

## 2023-10-25 ENCOUNTER — Ambulatory Visit (INDEPENDENT_AMBULATORY_CARE_PROVIDER_SITE_OTHER): Admitting: Sports Medicine

## 2023-10-25 ENCOUNTER — Other Ambulatory Visit: Payer: Self-pay

## 2023-10-25 VITALS — BP 150/70 | HR 50 | Ht 70.0 in | Wt 169.0 lb

## 2023-10-25 DIAGNOSIS — M25512 Pain in left shoulder: Secondary | ICD-10-CM | POA: Diagnosis not present

## 2023-10-25 DIAGNOSIS — M19012 Primary osteoarthritis, left shoulder: Secondary | ICD-10-CM

## 2023-10-25 DIAGNOSIS — M7552 Bursitis of left shoulder: Secondary | ICD-10-CM

## 2023-10-25 NOTE — Patient Instructions (Addendum)
 Injection in shoulder today. Physical therapy referral to . Follow up in 4 weeks.

## 2023-10-28 DIAGNOSIS — N390 Urinary tract infection, site not specified: Secondary | ICD-10-CM | POA: Diagnosis not present

## 2023-10-28 DIAGNOSIS — N189 Chronic kidney disease, unspecified: Secondary | ICD-10-CM | POA: Diagnosis not present

## 2023-10-28 DIAGNOSIS — Z940 Kidney transplant status: Secondary | ICD-10-CM | POA: Diagnosis not present

## 2023-10-30 DIAGNOSIS — I6521 Occlusion and stenosis of right carotid artery: Secondary | ICD-10-CM | POA: Diagnosis not present

## 2023-10-30 DIAGNOSIS — Z95828 Presence of other vascular implants and grafts: Secondary | ICD-10-CM | POA: Diagnosis not present

## 2023-11-21 NOTE — Progress Notes (Deleted)
 [name] [address] [place] [address] Phone: [phone]   Assessment and Plan:     There are no diagnoses linked to this encounter.  ***   Pertinent previous records reviewed include ***    Follow Up: ***     Subjective:   I, Brandon Carroll, am serving as a Neurosurgeon for Doctor Morene Mace  Chief Complaint: Left shoulder pain    HPI:    10/04/2023 Patient is a 63 year old male with left shoulder pain. Patient states pain for about a month. No radiating pain. Decreased ROM. No numbness or tingling. Tylenol  for the pain does not help.   10/25/2023 Last visit patient has a Subacromial injection. Patient states it is a little better but still feels a little stiff and might need another shot.   11/22/2023 Patient states   Relevant Historical Information: kidney transplant, CHF, hypertension, DM type II, antiplatelet therapy on Plavix, immunosuppression with chronic prednisone  use   Additional pertinent review of systems negative.   Current Outpatient Medications:    albuterol  (PROVENTIL ) (2.5 MG/3ML) 0.083% nebulizer solution, Take 3 mLs (2.5 mg total) by nebulization every 6 (six) hours as needed for wheezing or shortness of breath., Disp: 75 mL, Rfl: 5   aspirin  81 MG chewable tablet, Chew 81 mg by mouth once., Disp: , Rfl:    carvedilol  (COREG ) 6.25 MG tablet, Take 6.25 mg by mouth 2 (two) times daily with a meal., Disp: , Rfl:    clopidogrel (PLAVIX) 75 MG tablet, Take 1 tablet by mouth daily., Disp: , Rfl:    Continuous Blood Gluc Receiver (FREESTYLE LIBRE 2 READER) DEVI, 1 each by Other route See admin instructions. Glucose check, Disp: , Rfl:    Continuous Blood Gluc Sensor (FREESTYLE LIBRE 14 DAY SENSOR) MISC, Apply topically every 14 (fourteen) days., Disp: , Rfl:    empagliflozin (JARDIANCE) 10 MG TABS tablet, Take 1 tablet by mouth daily., Disp: , Rfl:    hydrALAZINE  (APRESOLINE ) 25 MG tablet, Take 1  tablet (25 mg total) by mouth 3 (three) times daily. 1rst attempt, patient needs and appt for additional refills, Disp: 270 tablet, Rfl: 0   insulin  lispro (HUMALOG ) 100 UNIT/ML injection, Inject 12 Units into the skin 3 (three) times daily with meals. 07/10/23: Reports during TOC call, he was prescribed this new insulin  at time of hospital discharge on 07/08/23- verified was not on his current medication list- added today according to patient report- he reports taking 12 U with each meal, TID, Disp: , Rfl:    isosorbide  mononitrate (IMDUR ) 120 MG 24 hr tablet, Take 1 tablet (120 mg total) by mouth daily., Disp: 90 tablet, Rfl: 3   latanoprost (XALATAN) 0.005 % ophthalmic solution, Place 1 drop into both eyes at bedtime., Disp: , Rfl:    magnesium oxide (MAG-OX) 400 MG tablet, Take 2 tablets by mouth daily., Disp: , Rfl:    mycophenolate (MYFORTIC) 180 MG EC tablet, Take 2 tablets by mouth 2 (two) times daily., Disp: , Rfl:    NIFEdipine (PROCARDIA-XL/NIFEDICAL-XL) 30 MG 24 hr tablet, Take 2 tablets by mouth daily., Disp: , Rfl:    predniSONE  (DELTASONE ) 5 MG tablet, Take 5 mg by mouth daily with breakfast., Disp: , Rfl:    rosuvastatin  (CRESTOR ) 20 MG tablet, Take 1 tablet by mouth daily., Disp: , Rfl:    sulfamethoxazole-trimethoprim (BACTRIM) 400-80 MG tablet, Take 1 tablet by mouth 3 (three) times a week., Disp: , Rfl:  tacrolimus  (PROGRAF ) 1 MG capsule, Take 3 mg by mouth 2 (two) times daily., Disp: , Rfl:    torsemide (DEMADEX) 20 MG tablet, Take 20 mg by mouth daily. 07/10/23: Reports during TOC call, he has been taking for a long time; verified was not on his current medication list- added today according to patient report, Disp: , Rfl:    TRESIBA  FLEXTOUCH 200 UNIT/ML FlexTouch Pen, ADMINISTER UP TO 40 UNITS UNDER THE SKIN EVERY DAY (Patient taking differently: 36 Units daily. ADMINISTER UP TO 40 UNITS UNDER THE SKIN EVERY DAY), Disp: 9 mL, Rfl: 1   valGANciclovir (VALCYTE) 450 MG tablet,  Take 450 mg by mouth daily., Disp: , Rfl:    Objective:     There were no vitals filed for this visit.    There is no height or weight on file to calculate BMI.    Physical Exam:    ***   Electronically signed by:  Odis Mace D.[name] 7:33 AM 11/21/23

## 2023-11-22 ENCOUNTER — Ambulatory Visit: Admitting: Sports Medicine

## 2023-12-11 DIAGNOSIS — Z940 Kidney transplant status: Secondary | ICD-10-CM | POA: Diagnosis not present

## 2023-12-11 DIAGNOSIS — E1129 Type 2 diabetes mellitus with other diabetic kidney complication: Secondary | ICD-10-CM | POA: Diagnosis not present

## 2023-12-11 DIAGNOSIS — Z8619 Personal history of other infectious and parasitic diseases: Secondary | ICD-10-CM | POA: Diagnosis not present

## 2023-12-11 DIAGNOSIS — I129 Hypertensive chronic kidney disease with stage 1 through stage 4 chronic kidney disease, or unspecified chronic kidney disease: Secondary | ICD-10-CM | POA: Diagnosis not present

## 2023-12-11 DIAGNOSIS — I5022 Chronic systolic (congestive) heart failure: Secondary | ICD-10-CM | POA: Diagnosis not present

## 2023-12-11 DIAGNOSIS — N390 Urinary tract infection, site not specified: Secondary | ICD-10-CM | POA: Diagnosis not present

## 2023-12-16 DIAGNOSIS — H40013 Open angle with borderline findings, low risk, bilateral: Secondary | ICD-10-CM | POA: Diagnosis not present

## 2023-12-16 DIAGNOSIS — H40033 Anatomical narrow angle, bilateral: Secondary | ICD-10-CM | POA: Diagnosis not present

## 2023-12-16 DIAGNOSIS — E113293 Type 2 diabetes mellitus with mild nonproliferative diabetic retinopathy without macular edema, bilateral: Secondary | ICD-10-CM | POA: Diagnosis not present

## 2023-12-16 DIAGNOSIS — H25813 Combined forms of age-related cataract, bilateral: Secondary | ICD-10-CM | POA: Diagnosis not present

## 2023-12-25 ENCOUNTER — Ambulatory Visit (INDEPENDENT_AMBULATORY_CARE_PROVIDER_SITE_OTHER): Admitting: Podiatry

## 2023-12-25 ENCOUNTER — Encounter: Payer: Self-pay | Admitting: Podiatry

## 2023-12-25 DIAGNOSIS — E1121 Type 2 diabetes mellitus with diabetic nephropathy: Secondary | ICD-10-CM

## 2023-12-25 DIAGNOSIS — Z794 Long term (current) use of insulin: Secondary | ICD-10-CM | POA: Diagnosis not present

## 2023-12-25 DIAGNOSIS — B351 Tinea unguium: Secondary | ICD-10-CM | POA: Diagnosis not present

## 2023-12-25 DIAGNOSIS — M79676 Pain in unspecified toe(s): Secondary | ICD-10-CM | POA: Diagnosis not present

## 2023-12-25 DIAGNOSIS — Z940 Kidney transplant status: Secondary | ICD-10-CM

## 2023-12-30 ENCOUNTER — Encounter: Payer: Self-pay | Admitting: Podiatry

## 2023-12-30 NOTE — Progress Notes (Addendum)
  Subjective:  Patient ID: Brandon Carroll, male    DOB: 08-Jan-1961,  MRN: [id number]  Brandon Carroll presents to clinic today for at risk foot care with h/o NIDDM, is s/p kidney transplant and is on chronic immunosuppressive therapy.  Patient is seen today for and painful thick toenails that are difficult to trim. Pain interferes with ambulation. Aggravating factors include wearing enclosed shoe gear. Pain is relieved with periodic professional debridement.  Chief Complaint  Patient presents with   Diabetes    [facility] NIDDM A1C 7.2 Toenail trim LOV with PCP 09/30/23.    New problem(s): None.   PCP is Rollene Almarie LABOR, MD.  Allergies  Allergen Reactions   Lipitor [Atorvastatin ] Hives and Itching    Review of Systems: Negative except as noted in the HPI.  Objective:  There were no vitals filed for this visit. Brandon Carroll is a pleasant 63 y.o. male WD, WN in NAD. AAO x 3.  Vascular Examination: Capillary refill time immediate b/l. Vascular status intact b/l with palpable pedal pulses. Pedal hair present b/l. No pain with calf compression b/l. Skin temperature gradient WNL b/l. No cyanosis or clubbing b/l. No ischemia or gangrene noted b/l. No edema b/l.  Neurological Examination: Sensation grossly intact b/l with 10 gram monofilament. Vibratory sensation intact b/l.   Dermatological Examination: Healing scab noted dorsal 1st metatarsal left foot with no erythema, no edema, no drainage, no fluctuance. Pedal skin with normal turgor, texture and tone b/l.  No open wounds. No interdigital macerations.   Toenails 1-5 b/l thick, discolored, elongated with subungual debris and pain on dorsal palpation.  Resolved hyperkeratotic lesion(s) submet head 5 left foot and sub 5th met base b/l lower extremities.  No erythema, no edema, no drainage, no fluctuance.  Musculoskeletal Examination: Muscle strength 5/5 to all lower extremity muscle groups bilaterally. 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  Radiographs: None  Assessment/Plan: 1. Pain due to onychomycosis of toenail   2. Deceased-donor kidney transplant   3. Type 2 diabetes mellitus with diabetic nephropathy, with long-term current use of insulin  [facility])    Consent given for treatment. Patient examined. All patient's and/or POA's questions/concerns addressed on today's visit. Mycotic toenails 1-5 debrided in length and girth without incident. Treatment was provided by assistant Andrez Manchester under my supervision. Continue foot and shoe inspections daily. Monitor blood glucose per PCP/Endocrinologist's recommendations.Continue soft, supportive shoe gear daily. Report any pedal injuries to medical professional. Call office if there are any quesitons/concerns.  Return in about 3 months (around 03/26/2024).  Delon LITTIE Merlin, DPM      [facility] LOCATION: [address] [place] [address]                   Office [phone]   [facility] LOCATION: [address] [place], [place] [address] Office [phone]

## 2024-01-09 ENCOUNTER — Encounter: Payer: Self-pay | Admitting: Sports Medicine

## 2024-02-04 DIAGNOSIS — E1121 Type 2 diabetes mellitus with diabetic nephropathy: Secondary | ICD-10-CM | POA: Diagnosis not present

## 2024-02-04 DIAGNOSIS — Z794 Long term (current) use of insulin: Secondary | ICD-10-CM | POA: Diagnosis not present

## 2024-02-11 DIAGNOSIS — I517 Cardiomegaly: Secondary | ICD-10-CM | POA: Diagnosis not present

## 2024-02-11 DIAGNOSIS — R931 Abnormal findings on diagnostic imaging of heart and coronary circulation: Secondary | ICD-10-CM | POA: Diagnosis not present

## 2024-02-11 DIAGNOSIS — I5189 Other ill-defined heart diseases: Secondary | ICD-10-CM | POA: Diagnosis not present

## 2024-02-11 DIAGNOSIS — I519 Heart disease, unspecified: Secondary | ICD-10-CM | POA: Diagnosis not present

## 2024-02-18 NOTE — Progress Notes (Unsigned)
 Brandon Jackson D.Carroll [facility] [address] [place] [address] Phone: [phone]   Assessment and Plan:     1. Chronic left shoulder pain (Primary) 2. Subacromial bursitis of left shoulder joint 3. Osteoarthritis of glenohumeral joint, left -Chronic with exacerbation, subsequent visit - Recurrence of left shoulder pain consistent with subacromial bursitis versus flare of mild glenohumeral arthritis - Patient had mild benefit from subacromial CSI on 10/04/2023, and more significant relief after intra-articular CSI on 10/25/2023.  Elected for repeat intra-articular CSI today.  Tolerated well per note below.  CSI may temporarily increase blood glucose in patient with past medical history of DM type II - Use Tylenol  500 to 1000 mg tablets 2-3 times a day for day-to-day pain relief  - I do not recommend NSAIDs or higher p.o. prednisone  courses due to past medical history of kidney transplant, CHF, hypertension, DM type II, antiplatelet therapy on Plavix, immunosuppression with chronic prednisone  use -Continue HEP and start physical therapy.  New PT referral sent  Procedure: Ultrasound Guided Glenohumeral Joint Injection Side: Left Diagnosis: Flare of osteoarthritis US  Indication:  - accuracy is paramount for diagnosis - to ensure therapeutic efficacy or procedural success - to reduce procedural risk  After explaining the procedure, viable alternatives, risks, and answering any questions, consent was given verbally. The site was cleaned with chlorhexidine  prep. An ultrasound transducer was placed on the posterior shoulder.  The posterior capsule, labrum, and infraspinatus were identified.  A steroid injection was performed under ultrasound guidance with sterile technique using  2ml of 1% lidocaine  without epinephrine and 40 mg of triamcinolone  (KENALOG) 40mg /ml. This was well tolerated and resulted in  relief.  Needle was removed and dressing placed and post  injection instructions were given including  a discussion of likely return of pain today after the anesthetic wears off (with the possibility of worsened pain) until the steroid starts to work in 1-3 days.   Pt was advised to call or return to clinic if these symptoms worsen or fail to improve as anticipated.   Images permanently stored.   Pertinent previous records reviewed include none   Follow Up: As needed if no improvement in 2 to 3 weeks.  Could consider advanced imaging versus subacromial CSI   Subjective:   I, Brandon Carroll, am serving as a Neurosurgeon for Doctor Morene Mace  Chief Complaint: Left shoulder pain    HPI:    10/04/2023 Patient is a 63 year old male with left shoulder pain. Patient states pain for about a month. No radiating pain. Decreased ROM. No numbness or tingling. Tylenol  for the pain does not help.   10/25/2023 Last visit patient has a Subacromial injection. Patient states it is a little better but still feels a little stiff and might need another shot.   02/19/2024 Patient states pain came back a couple of days ago.   Relevant Historical Information: kidney transplant, CHF, hypertension, DM type II, antiplatelet therapy on Plavix, immunosuppression with chronic prednisone  use   Additional pertinent review of systems negative.   Current Outpatient Medications:    albuterol  (PROVENTIL ) (2.5 MG/3ML) 0.083% nebulizer solution, Take 3 mLs (2.5 mg total) by nebulization every 6 (six) hours as needed for wheezing or shortness of breath., Disp: 75 mL, Rfl: 5   aspirin  81 MG chewable tablet, Chew 81 mg by mouth once., Disp: , Rfl:    carvedilol  (COREG ) 6.25 MG tablet, Take 6.25 mg by mouth 2 (two) times daily with a meal.,  Disp: , Rfl:    clopidogrel (PLAVIX) 75 MG tablet, Take 1 tablet by mouth daily., Disp: , Rfl:    Continuous Blood Gluc Receiver (FREESTYLE LIBRE 2 READER) DEVI, 1 each by Other route See admin instructions. Glucose check, Disp: , Rfl:     Continuous Blood Gluc Sensor (FREESTYLE LIBRE 14 DAY SENSOR) MISC, Apply topically every 14 (fourteen) days., Disp: , Rfl:    empagliflozin (JARDIANCE) 10 MG TABS tablet, Take 1 tablet by mouth daily., Disp: , Rfl:    hydrALAZINE  (APRESOLINE ) 25 MG tablet, Take 1 tablet (25 mg total) by mouth 3 (three) times daily. 1rst attempt, patient needs and appt for additional refills, Disp: 270 tablet, Rfl: 0   insulin  lispro (HUMALOG ) 100 UNIT/ML injection, Inject 12 Units into the skin 3 (three) times daily with meals. 07/10/23: Reports during TOC call, he was prescribed this new insulin  at time of hospital discharge on 07/08/23- verified was not on his current medication list- added today according to patient report- he reports taking 12 U with each meal, TID, Disp: , Rfl:    isosorbide  mononitrate (IMDUR ) 120 MG 24 hr tablet, Take 1 tablet (120 mg total) by mouth daily., Disp: 90 tablet, Rfl: 3   latanoprost (XALATAN) 0.005 % ophthalmic solution, Place 1 drop into both eyes at bedtime., Disp: , Rfl:    magnesium oxide (MAG-OX) 400 MG tablet, Take 2 tablets by mouth daily., Disp: , Rfl:    mycophenolate (MYFORTIC) 180 MG EC tablet, Take 2 tablets by mouth 2 (two) times daily., Disp: , Rfl:    NIFEdipine (PROCARDIA-XL/NIFEDICAL-XL) 30 MG 24 hr tablet, Take 2 tablets by mouth daily., Disp: , Rfl:    predniSONE  (DELTASONE ) 5 MG tablet, Take 5 mg by mouth daily with breakfast., Disp: , Rfl:    rosuvastatin  (CRESTOR ) 20 MG tablet, Take 1 tablet by mouth daily., Disp: , Rfl:    sulfamethoxazole-trimethoprim (BACTRIM) 400-80 MG tablet, Take 1 tablet by mouth 3 (three) times a week., Disp: , Rfl:    tacrolimus  (PROGRAF ) 1 MG capsule, Take 3 mg by mouth 2 (two) times daily., Disp: , Rfl:    torsemide (DEMADEX) 20 MG tablet, Take 20 mg by mouth daily. 07/10/23: Reports during TOC call, he has been taking for a long time; verified was not on his current medication list- added today according to patient report, Disp: ,  Rfl:    TRESIBA  FLEXTOUCH 200 UNIT/ML FlexTouch Pen, ADMINISTER UP TO 40 UNITS UNDER THE SKIN EVERY DAY (Patient taking differently: 36 Units daily. ADMINISTER UP TO 40 UNITS UNDER THE SKIN EVERY DAY), Disp: 9 mL, Rfl: 1   valGANciclovir (VALCYTE) 450 MG tablet, Take 450 mg by mouth daily., Disp: , Rfl:    Objective:     Vitals:   02/19/24 0827  Pulse: 90  SpO2: 98%  Weight: 167 lb (75.8 kg)  Height: 5' 10 (1.778 m)      Body mass index is 23.96 kg/m.    Physical Exam:    Gen: Appears well, nad, nontoxic and pleasant Neuro:sensation intact, strength is 5/5 with df/pf/inv/ev, muscle tone wnl Skin: no suspicious lesion or defmority Psych: A&O, appropriate mood and affect   Left shoulder:  No deformity, swelling or muscle wasting No scapular winging FF 120, abd 80, int 10, ext 80 with painful arc in all directions NTTP over the [place], clavicle, ac, coracoid, biceps groove, humerus, deltoid, trapezius, cervical spine Positive neer, hawkins, empty can, obriens, crossarm, subscap liftoff, Negative speeds Neg ant drawer, sulcus  sign, apprehension Negative Spurling's test bilat FROM of neck     Electronically signed by:  Odis Mace D.Carroll [facility] 9:01 AM 02/19/24

## 2024-02-19 ENCOUNTER — Ambulatory Visit: Admitting: Sports Medicine

## 2024-02-19 ENCOUNTER — Other Ambulatory Visit: Payer: Self-pay

## 2024-02-19 ENCOUNTER — Ambulatory Visit

## 2024-02-19 VITALS — HR 90 | Ht 70.0 in | Wt 167.0 lb

## 2024-02-19 DIAGNOSIS — G8929 Other chronic pain: Secondary | ICD-10-CM | POA: Diagnosis not present

## 2024-02-19 DIAGNOSIS — M25512 Pain in left shoulder: Secondary | ICD-10-CM

## 2024-02-19 DIAGNOSIS — M7552 Bursitis of left shoulder: Secondary | ICD-10-CM

## 2024-02-19 DIAGNOSIS — M19012 Primary osteoarthritis, left shoulder: Secondary | ICD-10-CM

## 2024-02-19 NOTE — Patient Instructions (Signed)
 Tylenol  [phone] mg 2-3 times a day for pain relief   PT referral   As needed follow up if no improvement 3-4 week follow up

## 2024-02-25 NOTE — Progress Notes (Unsigned)
 [facility] [address] [place] [address] Phone: [phone]   Assessment and Plan:     1. Chronic left shoulder pain (Primary) 2. Subacromial bursitis of left shoulder joint 3. Osteoarthritis of glenohumeral joint, left -Chronic with exacerbation, subsequent visit - Continued significant left shoulder pain.  Most consistent with subacromial bursitis versus mild glenohumeral osteoarthritis.  Differential includes rotator cuff tear pathology - Patient previously had benefit from subacromial and intra-articular CSI on 10/04/2023 and 10/25/2023 respectively, however patient had no significant relief after intra-articular CSI on 02/19/2024 - Patient elected for repeat subacromial CSI.  Tolerated well per note below.  CSI might temporarily increase blood glucose in patient with past medical history of DM type II - Use Tylenol  500 to 1000 mg tablets 2-3 times a day for day-to-day pain relief - I do not recommend NSAIDs or higher p.o. prednisone  courses due to past medical history of kidney transplant, CHF, hypertension, DM type II, antiplatelet therapy on Plavix, immunosuppression with chronic prednisone  use - Recommend further evaluation with MRI of left shoulder due to continued pain despite >6 weeks of conservative therapy, pain with day-to-day activities, pain >6/10   Procedure: Subacromial Injection Side: Left  Risks explained and consent was given verbally. The site was cleaned with alcohol prep. A steroid injection was performed from posterior approach using 2mL of 1% lidocaine  without epinephrine and 1mL of kenalog 40mg /ml. This was well tolerated.  Needle was removed, hemostasis achieved, and post injection instructions were explained.   Pt was advised to call or return to clinic if these symptoms worsen or fail to improve as anticipated.   Pertinent previous records reviewed include none   Follow Up: 5 days after MRI to review  results and discuss treatment plan   Subjective:   I, Brandon Carroll, am serving as a Neurosurgeon for Doctor Brandon Carroll   Chief Complaint: Left shoulder pain    HPI:    10/04/2023 Patient is a 63 year old male with left shoulder pain. Patient states pain for about a month. No radiating pain. Decreased ROM. No numbness or tingling. Tylenol  for the pain does not help.   10/25/2023 Last visit patient has a Subacromial injection. Patient states it is a little better but still feels a little stiff and might need another shot.    02/19/2024 Patient states pain came back a couple of days ago.  02/26/2024 Patient states he still has pain bad. He isnt able to do ADLs    Relevant Historical Information: kidney transplant, CHF, hypertension, DM type II, antiplatelet therapy on Plavix, immunosuppression with chronic prednisone  use   Additional pertinent review of systems negative.   Current Outpatient Medications:    albuterol  (PROVENTIL ) (2.5 MG/3ML) 0.083% nebulizer solution, Take 3 mLs (2.5 mg total) by nebulization every 6 (six) hours as needed for wheezing or shortness of breath., Disp: 75 mL, Rfl: 5   aspirin  81 MG chewable tablet, Chew 81 mg by mouth once., Disp: , Rfl:    carvedilol  (COREG ) 6.25 MG tablet, Take 6.25 mg by mouth 2 (two) times daily with a meal., Disp: , Rfl:    clopidogrel (PLAVIX) 75 MG tablet, Take 1 tablet by mouth daily., Disp: , Rfl:    Continuous Blood Gluc Receiver (FREESTYLE LIBRE 2 READER) DEVI, 1 each by Other route See admin instructions. Glucose check, Disp: , Rfl:    Continuous Blood Gluc Sensor (FREESTYLE LIBRE 14 DAY SENSOR) MISC, Apply topically every 14 (fourteen) days.,  Disp: , Rfl:    empagliflozin (JARDIANCE) 10 MG TABS tablet, Take 1 tablet by mouth daily., Disp: , Rfl:    hydrALAZINE  (APRESOLINE ) 25 MG tablet, Take 1 tablet (25 mg total) by mouth 3 (three) times daily. 1rst attempt, patient needs and appt for additional refills, Disp: 270 tablet,  Rfl: 0   insulin  lispro (HUMALOG ) 100 UNIT/ML injection, Inject 12 Units into the skin 3 (three) times daily with meals. 07/10/23: Reports during TOC call, he was prescribed this new insulin  at time of hospital discharge on 07/08/23- verified was not on his current medication list- added today according to patient report- he reports taking 12 U with each meal, TID, Disp: , Rfl:    isosorbide  mononitrate (IMDUR ) 120 MG 24 hr tablet, Take 1 tablet (120 mg total) by mouth daily., Disp: 90 tablet, Rfl: 3   latanoprost (XALATAN) 0.005 % ophthalmic solution, Place 1 drop into both eyes at bedtime., Disp: , Rfl:    magnesium oxide (MAG-OX) 400 MG tablet, Take 2 tablets by mouth daily., Disp: , Rfl:    mycophenolate (MYFORTIC) 180 MG EC tablet, Take 2 tablets by mouth 2 (two) times daily., Disp: , Rfl:    NIFEdipine (PROCARDIA-XL/NIFEDICAL-XL) 30 MG 24 hr tablet, Take 2 tablets by mouth daily., Disp: , Rfl:    predniSONE  (DELTASONE ) 5 MG tablet, Take 5 mg by mouth daily with breakfast., Disp: , Rfl:    rosuvastatin  (CRESTOR ) 20 MG tablet, Take 1 tablet by mouth daily., Disp: , Rfl:    sulfamethoxazole-trimethoprim (BACTRIM) 400-80 MG tablet, Take 1 tablet by mouth 3 (three) times a week., Disp: , Rfl:    tacrolimus  (PROGRAF ) 1 MG capsule, Take 3 mg by mouth 2 (two) times daily., Disp: , Rfl:    torsemide (DEMADEX) 20 MG tablet, Take 20 mg by mouth daily. 07/10/23: Reports during TOC call, he has been taking for a long time; verified was not on his current medication list- added today according to patient report, Disp: , Rfl:    TRESIBA  FLEXTOUCH 200 UNIT/ML FlexTouch Pen, ADMINISTER UP TO 40 UNITS UNDER THE SKIN EVERY DAY (Patient taking differently: 36 Units daily. ADMINISTER UP TO 40 UNITS UNDER THE SKIN EVERY DAY), Disp: 9 mL, Rfl: 1   valGANciclovir (VALCYTE) 450 MG tablet, Take 450 mg by mouth daily., Disp: , Rfl:    Objective:     Vitals:   02/26/24 1320  Pulse: (!) 56  SpO2: 97%  Weight: 163 lb  (73.9 kg)  Height: 5' 10 (1.778 m)      Body mass index is 23.39 kg/m.    Physical Exam:    Gen: Appears well, nad, nontoxic and pleasant Neuro:sensation intact, strength is 5/5 with df/pf/inv/ev, muscle tone wnl Skin: no suspicious lesion or defmority Psych: A&O, appropriate mood and affect   Left shoulder:  No deformity, swelling or muscle wasting No scapular winging FF 120, abd 80, int 10, ext 80 with painful arc in all directions NTTP over the , clavicle, ac, coracoid, biceps groove, humerus, deltoid, trapezius, cervical spine Positive neer, hawkins, empty can, obriens, crossarm, subscap liftoff, Negative speeds Neg ant drawer, sulcus sign, apprehension Negative Spurling's test bilat FROM of neck     Electronically signed by:  Odis [facility] D.CLEMENTEEN [facility] 1:27 PM 02/26/24

## 2024-02-26 ENCOUNTER — Ambulatory Visit (INDEPENDENT_AMBULATORY_CARE_PROVIDER_SITE_OTHER): Admitting: Sports Medicine

## 2024-02-26 VITALS — HR 56 | Ht 70.0 in | Wt 163.0 lb

## 2024-02-26 DIAGNOSIS — G8929 Other chronic pain: Secondary | ICD-10-CM | POA: Diagnosis not present

## 2024-02-26 DIAGNOSIS — M25512 Pain in left shoulder: Secondary | ICD-10-CM | POA: Diagnosis not present

## 2024-02-26 DIAGNOSIS — M7552 Bursitis of left shoulder: Secondary | ICD-10-CM

## 2024-02-26 DIAGNOSIS — M19012 Primary osteoarthritis, left shoulder: Secondary | ICD-10-CM

## 2024-02-26 LAB — LAB REPORT - SCANNED: EGFR: 41

## 2024-02-26 NOTE — Patient Instructions (Signed)
 Left shoulder MRI   Follow up 1 week after MRI to discuss results

## 2024-03-01 ENCOUNTER — Ambulatory Visit

## 2024-03-01 DIAGNOSIS — M7552 Bursitis of left shoulder: Secondary | ICD-10-CM | POA: Diagnosis not present

## 2024-03-01 DIAGNOSIS — G8929 Other chronic pain: Secondary | ICD-10-CM

## 2024-03-01 DIAGNOSIS — M19012 Primary osteoarthritis, left shoulder: Secondary | ICD-10-CM

## 2024-03-04 NOTE — Progress Notes (Unsigned)
 [facility] [address] [place] [address] Phone: [phone]   Assessment and Plan:     1. Neck pain (Primary) 2. DDD (degenerative disc disease), cervical 3. Chronic left shoulder pain -Chronic with exacerbation, subsequent visit - Continued significant left shoulder pain.  Pathology is most consistent with cervical radiculopathy.  Patient's pain was originally thought to be related to glenohumeral joint versus rotator cuff pathology, however MRI of left shoulder on 03/01/2024 was unremarkable and patient has had minimal to no relief with multiple subacromial and intra-articular CSI.  Patient does have degenerative changes on prior C-spine x-ray that would be consistent with cervical DDD with cervical radiculopathy - Recommend further evaluation with C-spine MRI based on prior imaging, pain for >6 weeks, pain with day-to-day activity - Patient takes prednisone  5 mg chronically.  Recommend additional prednisone  Dosepak to decrease symptoms -Do not recommend p.o. NSAIDs with past medical history of DM type II, antiplatelet therapy with Plavix, hypertension, CHF, history of kidney transplant  Pertinent previous records reviewed include left shoulder MRI from 03/01/2024 that showed mild AC joint OA but was otherwise unremarkable   Follow Up: 1 week after MRI to review results.  Could consider epidural CSI   Subjective:   I, Danicia Terhaar, am serving as a Neurosurgeon for Doctor Morene Mace   Chief Complaint: Left shoulder pain    HPI:    10/04/2023 Patient is a 63 year old male with left shoulder pain. Patient states pain for about a month. No radiating pain. Decreased ROM. No numbness or tingling. Tylenol  for the pain does not help.   10/25/2023 Last visit patient has a Subacromial injection. Patient states it is a little better but still feels a little stiff and might need another shot.    02/19/2024 Patient states pain  came back a couple of days ago.   02/26/2024 Patient states he still has pain bad. He isnt able to do ADLs   03/05/2024 Patient states he is feeling the same    Relevant Historical Information: kidney transplant, CHF, hypertension, DM type II, antiplatelet therapy on Plavix, immunosuppression with chronic prednisone  use   Additional pertinent review of systems negative.   Current Outpatient Medications:    methylPREDNISolone  (MEDROL  DOSEPAK) 4 MG TBPK tablet, Take 6 tablets on day 1.  Take 5 tablets on day 2.  Take 4 tablets on day 3.  Take 3 tablets on day 4.  Take 2 tablets on day 5.  Take 1 tablet on day 6., Disp: 21 tablet, Rfl: 0   albuterol  (PROVENTIL ) (2.5 MG/3ML) 0.083% nebulizer solution, Take 3 mLs (2.5 mg total) by nebulization every 6 (six) hours as needed for wheezing or shortness of breath., Disp: 75 mL, Rfl: 5   aspirin  81 MG chewable tablet, Chew 81 mg by mouth once., Disp: , Rfl:    carvedilol  (COREG ) 6.25 MG tablet, Take 6.25 mg by mouth 2 (two) times daily with a meal., Disp: , Rfl:    clopidogrel (PLAVIX) 75 MG tablet, Take 1 tablet by mouth daily., Disp: , Rfl:    Continuous Blood Gluc Receiver (FREESTYLE LIBRE 2 READER) DEVI, 1 each by Other route See admin instructions. Glucose check, Disp: , Rfl:    Continuous Blood Gluc Sensor (FREESTYLE LIBRE 14 DAY SENSOR) MISC, Apply topically every 14 (fourteen) days., Disp: , Rfl:    empagliflozin (JARDIANCE) 10 MG TABS tablet, Take 1 tablet by mouth daily., Disp: , Rfl:    hydrALAZINE  (APRESOLINE )  25 MG tablet, Take 1 tablet (25 mg total) by mouth 3 (three) times daily. 1rst attempt, patient needs and appt for additional refills, Disp: 270 tablet, Rfl: 0   insulin  lispro (HUMALOG ) 100 UNIT/ML injection, Inject 12 Units into the skin 3 (three) times daily with meals. 07/10/23: Reports during TOC call, he was prescribed this new insulin  at time of hospital discharge on 07/08/23- verified was not on his current medication list- added  today according to patient report- he reports taking 12 U with each meal, TID, Disp: , Rfl:    isosorbide  mononitrate (IMDUR ) 120 MG 24 hr tablet, Take 1 tablet (120 mg total) by mouth daily., Disp: 90 tablet, Rfl: 3   latanoprost (XALATAN) 0.005 % ophthalmic solution, Place 1 drop into both eyes at bedtime., Disp: , Rfl:    magnesium oxide (MAG-OX) 400 MG tablet, Take 2 tablets by mouth daily., Disp: , Rfl:    mycophenolate (MYFORTIC) 180 MG EC tablet, Take 2 tablets by mouth 2 (two) times daily., Disp: , Rfl:    NIFEdipine (PROCARDIA-XL/NIFEDICAL-XL) 30 MG 24 hr tablet, Take 2 tablets by mouth daily., Disp: , Rfl:    predniSONE  (DELTASONE ) 5 MG tablet, Take 5 mg by mouth daily with breakfast., Disp: , Rfl:    rosuvastatin  (CRESTOR ) 20 MG tablet, Take 1 tablet by mouth daily., Disp: , Rfl:    sulfamethoxazole-trimethoprim (BACTRIM) 400-80 MG tablet, Take 1 tablet by mouth 3 (three) times a week., Disp: , Rfl:    tacrolimus  (PROGRAF ) 1 MG capsule, Take 3 mg by mouth 2 (two) times daily., Disp: , Rfl:    torsemide (DEMADEX) 20 MG tablet, Take 20 mg by mouth daily. 07/10/23: Reports during TOC call, he has been taking for a long time; verified was not on his current medication list- added today according to patient report, Disp: , Rfl:    TRESIBA  FLEXTOUCH 200 UNIT/ML FlexTouch Pen, ADMINISTER UP TO 40 UNITS UNDER THE SKIN EVERY DAY (Patient taking differently: 36 Units daily. ADMINISTER UP TO 40 UNITS UNDER THE SKIN EVERY DAY), Disp: 9 mL, Rfl: 1   valGANciclovir (VALCYTE) 450 MG tablet, Take 450 mg by mouth daily., Disp: , Rfl:    Objective:     Vitals:   03/05/24 1527  Pulse: (!) 50  SpO2: 96%  Weight: 163 lb (73.9 kg)  Height: 5' 10 (1.778 m)      Body mass index is 23.39 kg/m.    Physical Exam:    Gen: Appears well, nad, nontoxic and pleasant Neuro:sensation intact, strength is 5/5 with df/pf/inv/ev, muscle tone wnl Skin: no suspicious lesion or defmority Psych: A&O, appropriate  mood and affect   Left shoulder:  No deformity, swelling or muscle wasting No scapular winging FF 120, abd 80, int 10, ext 80 with painful arc in all directions NTTP over the [place], clavicle, ac, coracoid, biceps groove, humerus, deltoid, trapezius, cervical spine Positive neer, hawkins, empty can, obriens, crossarm, subscap liftoff, Negative speeds Neg ant drawer, sulcus sign, apprehension Negative Spurling's test bilat  ROM of neck reduced in all directions TTP cervical paraspinal, left trapezius    Electronically signed by:  Odis Mace D.[facility] 3:56 PM 03/05/24

## 2024-03-05 ENCOUNTER — Ambulatory Visit: Admitting: Sports Medicine

## 2024-03-05 VITALS — HR 50 | Ht 70.0 in | Wt 163.0 lb

## 2024-03-05 DIAGNOSIS — M542 Cervicalgia: Secondary | ICD-10-CM

## 2024-03-05 DIAGNOSIS — G8929 Other chronic pain: Secondary | ICD-10-CM | POA: Diagnosis not present

## 2024-03-05 DIAGNOSIS — M25512 Pain in left shoulder: Secondary | ICD-10-CM

## 2024-03-05 DIAGNOSIS — M5030 Other cervical disc degeneration, unspecified cervical region: Secondary | ICD-10-CM

## 2024-03-05 MED ORDER — METHYLPREDNISOLONE 4 MG PO TBPK: ORAL_TABLET | ORAL | 0 refills | Status: AC

## 2024-03-05 NOTE — Patient Instructions (Addendum)
 Mri cervical   Prednisone  dos pak   Follow up 1 week after to discuss results

## 2024-03-16 NOTE — Therapy (Signed)
 OUTPATIENT PHYSICAL THERAPY SHOULDER EVALUATION   Patient Name: Brandon Carroll MRN: [id number] DOB:1961-03-22, 63 y.o., male Today's Date: 03/17/2024  END OF SESSION:  PT End of Session - 03/17/24 0831     Visit Number 1    Number of Visits 17    Date for Recertification  05/12/24    Authorization Type medicare AB    Progress Note Due on Visit 10    PT Start Time 0838    PT Stop Time 0918    PT Time Calculation (min) 40 min          Past Medical History:  Diagnosis Date   A-V fistula 03/12/2019   Abnormal CT of the chest 06/08/2015   Abnormal echocardiogram 04/28/2019   Acute systolic CHF (congestive heart failure), NYHA class 2 (HCC) 05/04/2015   Acute-on-chronic kidney injury 05/04/2015   Anemia 06/06/2015   Anemia in chronic kidney disease 11/18/2018   Body mass index (BMI) 27.0-27.9, adult 10/20/2018   Chronic systolic CHF (congestive heart failure) (HCC) 06/15/2015   Coronary artery disease 02/17/2018   Cardiac catheterization 2017 showing 10% LAD and 10% RCA Echocardiogram and stress test done in December 2019 showed evidence of old small inferior lateral myocardial infarction   Diabetes (HCC) 06/20/2019   Diabetes mellitus    Diarrhea, unspecified 10/22/2018   Dyspnea    Encounter for general adult medical examination with abnormal findings 06/12/2019   Erectile dysfunction    ESRD (end stage renal disease) on dialysis [facility])    Essential hypertension 07/03/2016   Fluid overload, unspecified 01/26/2019   Hyperlipidemia    Hyperlipidemia associated with type 2 diabetes mellitus (HCC) 09/18/2011   Started statin therapy 09/2011.  Anticipate improvement as glucose control improves.    Hypertension    Iron deficiency anemia, unspecified 11/03/2018   Low testosterone  10/04/2015   12/01/2015 visit with Donnice Brooks, MD at [facility].    Mild protein-calorie malnutrition 11/14/2018   Pain, unspecified 10/22/2018   Pruritus, unspecified 10/22/2018   Sarcoidosis 07/02/2016    Presumptive diagnosis, never biopsied, based on mediastinal lymphadenopathy, nodularity and elevated ACE level   Secondary cardiomyopathy (HCC) 06/08/2015   Secondary hyperparathyroidism 10/20/2018   Past Surgical History:  Procedure Laterality Date   AV FISTULA PLACEMENT Left 05/26/2018   Procedure: ARTERIOVENOUS (AV) FISTULA CREATION LEFT ARM;  Surgeon: Gretta Lonni PARAS, MD;  Location: [facility] OR;  Service: Vascular;  Laterality: Left;   CARDIAC CATHETERIZATION N/A 06/15/2015   Procedure: Left Heart Cath and Coronary Angiography;  Surgeon: Peter M Jordan, MD;  Location: [facility] INVASIVE CV LAB;  Service: Cardiovascular;  Laterality: N/A;   CHOLECYSTECTOMY N/A 06/28/2017   Procedure: LAPAROSCOPIC CHOLECYSTECTOMY;  Surgeon: Rubin Calamity, MD;  Location: [facility] ORS;  Service: General;  Laterality: N/A;   HERNIA REPAIR  7th grade   umbilical   LEFT HEART CATH AND CORONARY ANGIOGRAPHY N/A 04/28/2019   Procedure: LEFT HEART CATH AND CORONARY ANGIOGRAPHY;  Surgeon: Anner Alm ORN, MD;  Location: [facility] INVASIVE CV LAB;  Service: Cardiovascular;  Laterality: N/A;   Left Kidney Transplant  07/12/2020   Patient Active Problem List   Diagnosis Date Noted   Rash 09/30/2023   Acute pain of left shoulder 09/30/2023   Hx of completed stroke 07/26/2023   Elevated serum creatinine 02/26/2022   Donor specific antibody (DSA) positive 09/13/2021   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   Neck pain 01/27/2021   Encounter for general adult medical examination with abnormal  findings 01/27/2021   Deceased-donor kidney transplant 07/21/2020   Immunosuppression 07/21/2020   Type 2 diabetes mellitus with diabetic nephropathy (HCC) 03/12/2019   A-V fistula 03/12/2019   Anemia in chronic kidney disease 11/18/2018   Coronary artery disease 02/17/2018   Essential hypertension 07/03/2016   Sarcoidosis 07/02/2016   Erectile dysfunction 06/21/2015   Chronic  systolic CHF (congestive heart failure) (HCC) 06/15/2015   Hyperlipidemia associated with type 2 diabetes mellitus (HCC) 09/18/2011    PCP: Rollene Almarie LABOR, MD  REFERRING PROVIDER: Leonce Katz, DO  REFERRING DIAG: [id number] (ICD-10-CM) - Chronic left shoulder pain M75.52 (ICD-10-CM) - Subacromial bursitis of left shoulder joint M19.012 (ICD-10-CM) - Osteoarthritis of glenohumeral joint, left  THERAPY DIAG:  Cervicalgia  Left shoulder pain, unspecified chronicity  Abnormal posture  Rationale for Evaluation and Treatment: Rehabilitation  ONSET DATE: early 2025  SUBJECTIVE:                                                                                                                                                                                      SUBJECTIVE STATEMENT: States symptoms started in neck, earlier in the year. 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 Also took a flight to [place] in which he states he was positioned awkwardly with his neck. States neck seemed to have ended up mostly resolving but then developed pain in L shoulder about 4-5 months ago. States he got steroid injection in shoulder, helped initially. Had a second shot without any reported relief (about a month ago). Describes symptoms as worsening over time. No N/T, no headaches. Denies double vision, reports some occasional blurry vision. No speech/swallowing issues. No right sided symptoms.  Hand dominance: Right  PERTINENT HISTORY: LUE AV fistula, CHF, CAD, DM, HTN, sarcoidosis, kidney transplant Self reports CVA in March 2025  PAIN:  Are you having pain: 6-7/10 Location/description: L shoulder, superior lateral; aching Best-worst over past week: 6-9/10 (takes a while to settle)  - aggravating factors: sleeping (usually side sleeper), constant, driving  - Easing factors: ice,sitting in recliner    PRECAUTIONS: LUE AV  fistula (denies lifting restrictions, take BP on RUE)  RED FLAGS: None   WEIGHT BEARING RESTRICTIONS: No  FALLS:  Has patient fallen in last 6 months? No  LIVING ENVIRONMENT: Apartment, 1 level  Lives alone and takes care of home environment without assist   OCCUPATION: Not working - used to work at airport, ramp agent  PLOF: Independent - enjoys working out at [employer], walking  PATIENT GOALS: get back to workout routine, reduce pain  NEXT MD VISIT: MRI 03/20/24, Dr. Leonce follow up TBD  OBJECTIVE:  Note: Objective measures were completed at Evaluation unless otherwise noted.  DIAGNOSTIC FINDINGS:  C spine MRI scheduled for 03/20/24 per EPIC  L shoulder MRI: IMPRESSION: No internal derangement.   Mild acromioclavicular osteoarthritis.      PATIENT SURVEYS:  QuickDASH: 47.73%  COGNITION: Overall cognitive status: Within functional limits for tasks assessed     SENSATION: LT intact BIL UE Negative hoffman/tromner sign BIL   POSTURE: Significant FHP, rounded shoulders, inc kyphosis   CERVICAL SCREEN: Flexion: 35 deg Extension: 45 deg uncomfortable Rotation: 42 deg to R, 35 deg L painful     UPPER EXTREMITY ROM:  A/PROM Right eval Left eval  Shoulder flexion 136 deg 126 deg  Shoulder abduction 150 deg  140 deg   Shoulder internal rotation (functional combo behind back)    Shoulder external rotation (functional combo behind head) Spine of scap Spine of scap  Elbow flexion    Elbow extension    Wrist flexion    Wrist extension     (Blank rows = not tested) (Key: WFL = within functional limits not formally assessed, * = concordant pain, s = stiffness/stretching sensation, NT = not tested, OP= pain with overpressure only)  Comments:    UPPER EXTREMITY MMT:  MMT  Right eval Left eval  Shoulder flexion 4- 4-  Shoulder extension    Shoulder abduction 4 4  Shoulder internal rotation at neutral    Shoulder internal rotation at 45 deg     Shoulder internal rotation at 90 deg    Shoulder external rotation at neutral    Shoulder external rotation at 45 deg    Shoulder external rotation at 90 deg    Elbow flexion    Elbow extension    Grip strength 80# 65#  (Blank rows = not tested)  (Key: WFL = within functional limits not formally assessed, * = concordant pain, s = stiffness/stretching sensation, NT = not tested)  Comments:     PALPATION:  Concordant TTP L UT/LS, otherwise grossly unremarkable                                                                                                                             TREATMENT:   [facility] Adult PT Treatment:                                                DATE: 03/17/24 Therapeutic Exercise: Scap retraction, shoulder rolls practice reps + HEP handout/education  Self Care: Education/discussion re: exam findings as they relate to symptom behavior and provocative factors, rationale for interventions, relevant anatomy/physiology, PT POC   PATIENT EDUCATION: Education details: Pt education on PT impairments, prognosis, and POC. Informed consent. Rationale for interventions, safe/appropriate HEP performance Person educated: Patient Education method: Explanation, Demonstration, Tactile cues, Verbal cues Education comprehension: verbalized understanding, returned demonstration,  verbal cues required, tactile cues required, and needs further education    HOME EXERCISE PROGRAM: Access Code: [email or web address] URL: [email or web address] Date: 03/17/2024 Prepared by: Alm Jenny  Exercises - Seated Scapular Retraction  - 2-3 x daily - 1 sets - 10 reps - Seated Shoulder Rolls  - 2-3 x daily - 1 sets - 10 reps  ASSESSMENT:  CLINICAL IMPRESSION: Patient is a 62 y.o. gentleman who was seen today for physical therapy evaluation and treatment for shoulder/neck pain ongoing since beginning of year. Red flag questioning reassuring overall. He endorses constant pain, worsening  with lying on side, driving, turning head. On exam he demonstrates limitations in L GH mobility but not overtly painful, does have concordant pain with cervical mobility. No increase in resting pain with exam/HEP, no adverse events. Recommend trial of skilled PT to address aforementioned deficits with aim of improving functional tolerance and reducing pain with typical activities. Pt departs today's session in no acute distress, all voiced concerns/questions addressed appropriately from PT perspective.     OBJECTIVE IMPAIRMENTS: decreased activity tolerance, decreased endurance, decreased mobility, decreased ROM, decreased strength, hypomobility, impaired perceived functional ability, impaired UE functional use, postural dysfunction, and pain.   ACTIVITY LIMITATIONS: carrying, lifting, sleeping, and reach over head  PARTICIPATION LIMITATIONS: meal prep, cleaning, laundry, driving, and community activity  PERSONAL FACTORS: Age, Time since onset of injury/illness/exacerbation, and 3+ comorbidities: LUE AV fistula, CHF, CAD, DM, HTN, sarcoidosis, kidney transplant are also affecting patient's functional outcome.   REHAB POTENTIAL: Fair given comorbidities and chronicity  CLINICAL DECISION MAKING: Evolving/moderate complexity  EVALUATION COMPLEXITY: Moderate   GOALS:  SHORT TERM GOALS: Target date: 04/14/2024  Pt will demonstrate appropriate understanding and performance of initially prescribed HEP in order to facilitate improved independence with management of symptoms.  Baseline: HEP established  Goal status: INITIAL   2. Pt will report at least 25% improvement in overall pain levels over past week in order to facilitate improved tolerance to typical daily activities.   Baseline: 6-10/10  Goal status: INITIAL    LONG TERM GOALS: Target date: 05/12/2024   Pt will score less than or equal to 30% on Quick DASH in order to indicate reduced levels of disability due to shoulder pain (MDC  16-20pts).  Baseline: 47.73%   Goal status: INITIAL  2.  Pt will demonstrate at least 145 degrees of active shoulder elevation BIL in order to demonstrate improved tolerance to functional movement patterns such as reaching overhead.  Baseline: see ROM chart above Goal status: INITIAL  3.  Pt will demonstrate at least 50 deg of cervical rotation ROM BIL in order to facilitate improved environmental awareness and safety when driving. Baseline: see ROM chart above Goal status: INITIAL  4. Pt will report at least 50% decrease in overall pain levels in past week in order to facilitate improved tolerance to basic ADLs/mobility.   Baseline: 6-10/10  Goal status: INITIAL    PLAN:  PT FREQUENCY: 1-2x/week  PT DURATION: 8 weeks  PLANNED INTERVENTIONS: [id number]- PT Re-evaluation, 97750- Physical Performance Testing, 97110-Therapeutic exercises, 97530- Therapeutic activity, [id number]- Neuromuscular re-education, 97535- Self Care, [id number]- Manual therapy, Patient/Family education, Taping, Joint mobilization, Spinal mobilization, Cryotherapy, and Moist heat  PLAN FOR NEXT SESSION: Review/update HEP PRN. Work on [employer] exercises as appropriate with emphasis on cervical/GH mobility, posture, and periscapular stability. Symptom modification strategies as indicated/appropriate. Mindful of LUE AV fistula   Alm DELENA Jenny PT, DPT 03/17/2024 12:03 PM

## 2024-03-17 ENCOUNTER — Ambulatory Visit: Attending: Sports Medicine | Admitting: Physical Therapy

## 2024-03-17 ENCOUNTER — Encounter: Payer: Self-pay | Admitting: Physical Therapy

## 2024-03-17 ENCOUNTER — Other Ambulatory Visit: Payer: Self-pay

## 2024-03-17 DIAGNOSIS — M19012 Primary osteoarthritis, left shoulder: Secondary | ICD-10-CM | POA: Insufficient documentation

## 2024-03-17 DIAGNOSIS — M542 Cervicalgia: Secondary | ICD-10-CM | POA: Insufficient documentation

## 2024-03-17 DIAGNOSIS — R293 Abnormal posture: Secondary | ICD-10-CM | POA: Insufficient documentation

## 2024-03-17 DIAGNOSIS — M25512 Pain in left shoulder: Secondary | ICD-10-CM | POA: Diagnosis present

## 2024-03-17 DIAGNOSIS — G8929 Other chronic pain: Secondary | ICD-10-CM | POA: Insufficient documentation

## 2024-03-17 DIAGNOSIS — M7552 Bursitis of left shoulder: Secondary | ICD-10-CM | POA: Insufficient documentation

## 2024-03-20 ENCOUNTER — Ambulatory Visit
Admission: RE | Admit: 2024-03-20 | Discharge: 2024-03-20 | Disposition: A | Discharge status: Home / Self Care | Source: Ambulatory Visit | Attending: Sports Medicine | Admitting: Sports Medicine

## 2024-03-20 DIAGNOSIS — M542 Cervicalgia: Secondary | ICD-10-CM

## 2024-03-20 DIAGNOSIS — G8929 Other chronic pain: Secondary | ICD-10-CM

## 2024-03-20 DIAGNOSIS — M5030 Other cervical disc degeneration, unspecified cervical region: Secondary | ICD-10-CM

## 2024-03-24 ENCOUNTER — Encounter: Payer: Self-pay | Admitting: Physical Therapy

## 2024-03-24 ENCOUNTER — Ambulatory Visit: Admitting: Physical Therapy

## 2024-03-24 DIAGNOSIS — M542 Cervicalgia: Secondary | ICD-10-CM

## 2024-03-24 DIAGNOSIS — R293 Abnormal posture: Secondary | ICD-10-CM

## 2024-03-24 DIAGNOSIS — M25512 Pain in left shoulder: Secondary | ICD-10-CM

## 2024-03-24 NOTE — Therapy (Signed)
 OUTPATIENT PHYSICAL THERAPY TREATMENT   Patient Name: Brandon Carroll MRN: [id number] DOB:1960-08-11, 63 y.o., male Today's Date: 03/24/2024  END OF SESSION:  PT End of Session - 03/24/24 0801     Visit Number 2    Number of Visits 17    Date for Recertification  05/12/24    Authorization Type medicare AB    Progress Note Due on Visit 10    PT Start Time 0802    PT Stop Time 0841    PT Time Calculation (min) 39 min           Past Medical History:  Diagnosis Date   A-V fistula 03/12/2019   Abnormal CT of the chest 06/08/2015   Abnormal echocardiogram 04/28/2019   Acute systolic CHF (congestive heart failure), NYHA class 2 (HCC) 05/04/2015   Acute-on-chronic kidney injury 05/04/2015   Anemia 06/06/2015   Anemia in chronic kidney disease 11/18/2018   Body mass index (BMI) 27.0-27.9, adult 10/20/2018   Chronic systolic CHF (congestive heart failure) (HCC) 06/15/2015   Coronary artery disease 02/17/2018   Cardiac catheterization 2017 showing 10% LAD and 10% RCA Echocardiogram and stress test done in December 2019 showed evidence of old small inferior lateral myocardial infarction   Diabetes (HCC) 06/20/2019   Diabetes mellitus    Diarrhea, unspecified 10/22/2018   Dyspnea    Encounter for general adult medical examination with abnormal findings 06/12/2019   Erectile dysfunction    ESRD (end stage renal disease) on dialysis [facility])    Essential hypertension 07/03/2016   Fluid overload, unspecified 01/26/2019   Hyperlipidemia    Hyperlipidemia associated with type 2 diabetes mellitus (HCC) 09/18/2011   Started statin therapy 09/2011.  Anticipate improvement as glucose control improves.    Hypertension    Iron deficiency anemia, unspecified 11/03/2018   Low testosterone  10/04/2015   12/01/2015 visit with Donnice Brooks, MD at [facility].    Mild protein-calorie malnutrition 11/14/2018   Pain, unspecified 10/22/2018   Pruritus, unspecified 10/22/2018   Sarcoidosis 07/02/2016    Presumptive diagnosis, never biopsied, based on mediastinal lymphadenopathy, nodularity and elevated ACE level   Secondary cardiomyopathy (HCC) 06/08/2015   Secondary hyperparathyroidism 10/20/2018   Past Surgical History:  Procedure Laterality Date   AV FISTULA PLACEMENT Left 05/26/2018   Procedure: ARTERIOVENOUS (AV) FISTULA CREATION LEFT ARM;  Surgeon: Gretta Lonni PARAS, MD;  Location: [facility] OR;  Service: Vascular;  Laterality: Left;   CARDIAC CATHETERIZATION N/A 06/15/2015   Procedure: Left Heart Cath and Coronary Angiography;  Surgeon: Peter M Jordan, MD;  Location: [facility] INVASIVE CV LAB;  Service: Cardiovascular;  Laterality: N/A;   CHOLECYSTECTOMY N/A 06/28/2017   Procedure: LAPAROSCOPIC CHOLECYSTECTOMY;  Surgeon: Rubin Calamity, MD;  Location: [facility] ORS;  Service: General;  Laterality: N/A;   HERNIA REPAIR  7th grade   umbilical   LEFT HEART CATH AND CORONARY ANGIOGRAPHY N/A 04/28/2019   Procedure: LEFT HEART CATH AND CORONARY ANGIOGRAPHY;  Surgeon: Anner Alm ORN, MD;  Location: [facility] INVASIVE CV LAB;  Service: Cardiovascular;  Laterality: N/A;   Left Kidney Transplant  07/12/2020   Patient Active Problem List   Diagnosis Date Noted   Rash 09/30/2023   Acute pain of left shoulder 09/30/2023   Hx of completed stroke 07/26/2023   Elevated serum creatinine 02/26/2022   Donor specific antibody (DSA) positive 09/13/2021   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   Neck pain 01/27/2021   Encounter for general adult medical examination with abnormal  findings 01/27/2021   Deceased-donor kidney transplant 07/21/2020   Immunosuppression 07/21/2020   Type 2 diabetes mellitus with diabetic nephropathy (HCC) 03/12/2019   A-V fistula 03/12/2019   Anemia in chronic kidney disease 11/18/2018   Coronary artery disease 02/17/2018   Essential hypertension 07/03/2016   Sarcoidosis 07/02/2016   Erectile dysfunction 06/21/2015   Chronic  systolic CHF (congestive heart failure) (HCC) 06/15/2015   Hyperlipidemia associated with type 2 diabetes mellitus (HCC) 09/18/2011    PCP: Rollene Almarie LABOR, MD  REFERRING PROVIDER: Leonce Katz, DO  REFERRING DIAG: [phone] (ICD-10-CM) - Chronic left shoulder pain M75.52 (ICD-10-CM) - Subacromial bursitis of left shoulder joint M19.012 (ICD-10-CM) - Osteoarthritis of glenohumeral joint, left  THERAPY DIAG:  Cervicalgia  Left shoulder pain, unspecified chronicity  Abnormal posture  Rationale for Evaluation and Treatment: Rehabilitation  ONSET DATE: early 2025  SUBJECTIVE:                                                                                                                                                                                     Per eval: States symptoms started in neck, earlier in the year. 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 Also took a flight to [place] in which he states he was positioned awkwardly with his neck. States neck seemed to have ended up mostly resolving but then developed pain in L shoulder about 4-5 months ago. States he got steroid injection in shoulder, helped initially. Had a second shot without any reported relief (about a month ago). Describes symptoms as worsening over time. No N/T, no headaches. Denies double vision, reports some occasional blurry vision. No speech/swallowing issues. No right sided symptoms.  Hand dominance: Right  SUBJECTIVE STATEMENT: 03/24/2024: feeling about the same as usual. States he has stopped working with his brother and that has helped a bit. Got MRI done, no results yet back. Has done HEP a little bit, doesn't seem to change pain for better or worse.    PERTINENT HISTORY: LUE AV fistula, CHF, CAD, DM, HTN, sarcoidosis, kidney transplant Self reports CVA in March 2025  PAIN:  Are you having pain: 5/10 L lateral shoulder  Per  eval:  Location/description: L shoulder, superior lateral; aching Best-worst over past week: 6-9/10 (takes a while to settle)  - aggravating factors: sleeping (usually side sleeper), constant, driving  - Easing factors: ice,sitting in recliner    PRECAUTIONS: LUE AV fistula (denies lifting restrictions, take BP on RUE)  RED FLAGS: None   WEIGHT BEARING RESTRICTIONS: No  FALLS:  Has patient fallen in last 6 months? No  LIVING ENVIRONMENT: Apartment, 1 level  Lives  alone and takes care of home environment without assist   OCCUPATION: Not working - used to work at airport, ramp agent  PLOF: Independent - enjoys working out at [employer], walking  PATIENT GOALS: get back to workout routine, reduce pain  NEXT MD VISIT: MRI 03/20/24, Dr. Leonce follow up TBD   OBJECTIVE:  Note: Objective measures were completed at Evaluation unless otherwise noted.  DIAGNOSTIC FINDINGS:  C spine MRI scheduled for 03/20/24 per EPIC  L shoulder MRI: IMPRESSION: No internal derangement.   Mild acromioclavicular osteoarthritis.      PATIENT SURVEYS:  QuickDASH: 47.73%  COGNITION: Overall cognitive status: Within functional limits for tasks assessed     SENSATION: LT intact BIL UE Negative hoffman/tromner sign BIL   POSTURE: Significant FHP, rounded shoulders, inc kyphosis   CERVICAL SCREEN: Flexion: 35 deg Extension: 45 deg uncomfortable Rotation: 42 deg to R, 35 deg L painful     UPPER EXTREMITY ROM:  A/PROM Right eval Left eval  Shoulder flexion 136 deg 126 deg  Shoulder abduction 150 deg  140 deg   Shoulder internal rotation (functional combo behind back)    Shoulder external rotation (functional combo behind head) Spine of scap Spine of scap  Elbow flexion    Elbow extension    Wrist flexion    Wrist extension     (Blank rows = not tested) (Key: WFL = within functional limits not formally assessed, * = concordant pain, s = stiffness/stretching sensation, NT  = not tested, OP= pain with overpressure only)  Comments:    UPPER EXTREMITY MMT:  MMT  Right eval Left eval  Shoulder flexion 4- 4-  Shoulder extension    Shoulder abduction 4 4  Shoulder internal rotation at neutral    Shoulder internal rotation at 45 deg    Shoulder internal rotation at 90 deg    Shoulder external rotation at neutral    Shoulder external rotation at 45 deg    Shoulder external rotation at 90 deg    Elbow flexion    Elbow extension    Grip strength 80# 65#  (Blank rows = not tested)  (Key: WFL = within functional limits not formally assessed, * = concordant pain, s = stiffness/stretching sensation, NT = not tested)  Comments:     PALPATION:  Concordant TTP L UT/LS, otherwise grossly unremarkable                                                                                                                             TREATMENT:   [facility] Adult PT Treatment:                                                DATE: 03/24/24 Therapeutic Exercise: Shoulder rolls x10 fwd/back  Supine cervical rotation x8 BIL (2 pillow support) cues for  comfortable ROM and breath control  Supine cervical rotation to R only x8 2 pillow support  Standing swiss ball shoulder flexion x12 cues to relax UT  Standing swiss ball shoulder abduction x12  Standing wall slide x12 cues for posture and comfortable ROM  HEP update + education/handout   Neuromuscular re-ed: Shoulder retraction x10 tactile cues to improve periscapular activation, mitigate lumbar compensations  Scapular retraction w/ foam roll at wall for external 2x10 cues for posture and periscapular stability       [facility] Adult PT Treatment:                                                DATE: 03/17/24 Therapeutic Exercise: Scap retraction, shoulder rolls practice reps + HEP handout/education  Self Care: Education/discussion re: exam findings as they relate to symptom behavior and provocative factors, rationale for  interventions, relevant anatomy/physiology, PT POC   PATIENT EDUCATION: Education details: rationale for interventions, HEP  Person educated: Patient Education method: Explanation, Demonstration, Tactile cues, Verbal cues Education comprehension: verbalized understanding, returned demonstration, verbal cues required, tactile cues required, and needs further education     HOME EXERCISE PROGRAM: Access Code: [email or web address] URL: [email or web address] Date: 03/24/2024 Prepared by: Alm Jenny  Exercises - Seated Scapular Retraction  - 2-3 x daily - 1 sets - 10 reps - Seated Shoulder Rolls  - 2-3 x daily - 1 sets - 10 reps - Shoulder Flexion Wall Slide with Towel  - 2-3 x daily - 1 sets - 8 reps - Seated Shoulder Flexion Towel Slide at Table Top  - 2-3 x daily - 1 sets - 8 reps  ASSESSMENT:  CLINICAL IMPRESSION: 03/24/2024: Pt arrives w/ 5/10 pain in lateral L shoulder - overall reports symptoms about the same. Symptoms are fairly consistent throughout session but denies acute worsening. Does have some transient worsening with cervical extension and rotation to L. Best tolerance w/ shoulder mobility exercises, significant difficulty coordinating periscapular movements but improves w/ tactile cues. No adverse events, reports 5-6/10 pain on departure. Recommend continuing along current POC in order to address relevant deficits and improve functional tolerance. Pt departs today's session in no acute distress, all voiced questions/concerns addressed appropriately from PT perspective.    Per eval: Patient is a 63 y.o. gentleman who was seen today for physical therapy evaluation and treatment for shoulder/neck pain ongoing since beginning of year. Red flag questioning reassuring overall. He endorses constant pain, worsening with lying on side, driving, turning head. On exam he demonstrates limitations in L GH mobility but not overtly painful, does have concordant pain with cervical mobility.  No increase in resting pain with exam/HEP, no adverse events. Recommend trial of skilled PT to address aforementioned deficits with aim of improving functional tolerance and reducing pain with typical activities. Pt departs today's session in no acute distress, all voiced concerns/questions addressed appropriately from PT perspective.     OBJECTIVE IMPAIRMENTS: decreased activity tolerance, decreased endurance, decreased mobility, decreased ROM, decreased strength, hypomobility, impaired perceived functional ability, impaired UE functional use, postural dysfunction, and pain.   ACTIVITY LIMITATIONS: carrying, lifting, sleeping, and reach over head  PARTICIPATION LIMITATIONS: meal prep, cleaning, laundry, driving, and community activity  PERSONAL FACTORS: Age, Time since onset of injury/illness/exacerbation, and 3+ comorbidities: LUE AV fistula, CHF, CAD, DM, HTN, sarcoidosis, kidney transplant are also affecting patient's functional outcome.   REHAB POTENTIAL:  Fair given comorbidities and chronicity  CLINICAL DECISION MAKING: Evolving/moderate complexity  EVALUATION COMPLEXITY: Moderate   GOALS:  SHORT TERM GOALS: Target date: 04/14/2024  Pt will demonstrate appropriate understanding and performance of initially prescribed HEP in order to facilitate improved independence with management of symptoms.  Baseline: HEP established  Goal status: INITIAL   2. Pt will report at least 25% improvement in overall pain levels over past week in order to facilitate improved tolerance to typical daily activities.   Baseline: 6-10/10  Goal status: INITIAL    LONG TERM GOALS: Target date: 05/12/2024   Pt will score less than or equal to 30% on Quick DASH in order to indicate reduced levels of disability due to shoulder pain (MDC 16-20pts).  Baseline: 47.73%   Goal status: INITIAL  2.  Pt will demonstrate at least 145 degrees of active shoulder elevation BIL in order to demonstrate improved tolerance  to functional movement patterns such as reaching overhead.  Baseline: see ROM chart above Goal status: INITIAL  3.  Pt will demonstrate at least 50 deg of cervical rotation ROM BIL in order to facilitate improved environmental awareness and safety when driving. Baseline: see ROM chart above Goal status: INITIAL  4. Pt will report at least 50% decrease in overall pain levels in past week in order to facilitate improved tolerance to basic ADLs/mobility.   Baseline: 6-10/10  Goal status: INITIAL    PLAN:  PT FREQUENCY: 1-2x/week  PT DURATION: 8 weeks  PLANNED INTERVENTIONS: 97164- PT Re-evaluation, 97750- Physical Performance Testing, 97110-Therapeutic exercises, 97530- Therapeutic activity, [id number]- Neuromuscular re-education, 97535- Self Care, [id number]- Manual therapy, Patient/Family education, Taping, Joint mobilization, Spinal mobilization, Cryotherapy, and Moist heat  PLAN FOR NEXT SESSION: Review/update HEP PRN. Work on [employer] exercises as appropriate with emphasis on cervical/GH mobility, posture, and periscapular stability. Symptom modification strategies as indicated/appropriate. Mindful of LUE AV fistula   Alm DELENA Jenny PT, DPT 03/24/2024 8:47 AM

## 2024-03-26 ENCOUNTER — Ambulatory Visit: Admitting: Physical Therapy

## 2024-03-26 ENCOUNTER — Ambulatory Visit: Admitting: Sports Medicine

## 2024-03-26 VITALS — HR 56 | Ht 70.0 in | Wt 162.0 lb

## 2024-03-26 DIAGNOSIS — G8929 Other chronic pain: Secondary | ICD-10-CM

## 2024-03-26 DIAGNOSIS — M5030 Other cervical disc degeneration, unspecified cervical region: Secondary | ICD-10-CM

## 2024-03-26 DIAGNOSIS — M542 Cervicalgia: Secondary | ICD-10-CM

## 2024-03-26 DIAGNOSIS — M25512 Pain in left shoulder: Secondary | ICD-10-CM

## 2024-03-26 NOTE — Patient Instructions (Signed)
 Epidural left C6-7  Neurosurgery referral   As needed follow up

## 2024-03-26 NOTE — Progress Notes (Signed)
 Ben Josyah Achor D.CLEMENTEEN [name] [address] [place] [address] Phone: [phone]   Assessment and Plan:     1. Neck pain (Primary) 2. DDD (degenerative disc disease), cervical 3. Chronic left shoulder pain -Chronic with exacerbation, subsequent visit - Continued significant left shoulder pain.  Pathology is still most consistent with cervical radiculopathy.  I suspect moderate left foraminal narrowing at C4-5 has affected C5 nerve root and represents patient's primary pain generator - We discussed that epidural CSI cannot be safely performed above C6 level, so we will trial epidural CSI left-sided C6-7.  If this is not effective in reducing patient's symptoms, neurosurgery evaluation would be recommended.  Epidural CSI order placed.  Neurosurgery referral placed - Left shoulder MRI on 03/01/2024 was unremarkable and patient had minimal to no relief with multiple subacromial and intra-articular CSI's, so I do not feel that shoulder pathology is the primary cause of patient's symptoms - Patient takes prednisone  5 mg chronically.  Additional prednisone  Dosepak was trialed, but did not reduce symptoms - Do not recommend p.o. NSAIDs with past medical history of DM type II, antiplatelet therapy with Plavix, hypertension, CHF, history of kidney transplant  Pertinent previous records reviewed include cervical spine MRI IMPRESSION: 1. No acute findings or explanation for the patient's symptoms. 2. Mild multilevel cervical spondylosis as described. No significant spinal stenosis. Moderate left foraminal narrowing at C4-5 which could affect the left C5 nerve root. 3. Asymmetric facet hypertrophy on the left at C3-4 with associated mild foraminal narrowing.  Follow Up: As needed if no improvement or worsening of symptoms   Subjective:   I, Moenique Parris, am serving as a neurosurgeon for Doctor Morene Mace   Chief Complaint: Left shoulder pain    HPI:     10/04/2023 Patient is a 63 year old male with left shoulder pain. Patient states pain for about a month. No radiating pain. Decreased ROM. No numbness or tingling. Tylenol  for the pain does not help.   10/25/2023 Last visit patient has a Subacromial injection. Patient states it is a little better but still feels a little stiff and might need another shot.    02/19/2024 Patient states pain came back a couple of days ago.   02/26/2024 Patient states he still has pain bad. He isnt able to do ADLs    03/05/2024 Patient states he is feeling the same   03/26/2024 Patient states shoulder is still in pain    Relevant Historical Information: kidney transplant, CHF, hypertension, DM type II, antiplatelet therapy on Plavix, immunosuppression with chronic prednisone  use     Additional pertinent review of systems negative.   Current Outpatient Medications:    albuterol  (PROVENTIL ) (2.5 MG/3ML) 0.083% nebulizer solution, Take 3 mLs (2.5 mg total) by nebulization every 6 (six) hours as needed for wheezing or shortness of breath., Disp: 75 mL, Rfl: 5   aspirin  81 MG chewable tablet, Chew 81 mg by mouth once., Disp: , Rfl:    carvedilol  (COREG ) 6.25 MG tablet, Take 6.25 mg by mouth 2 (two) times daily with a meal., Disp: , Rfl:    clopidogrel (PLAVIX) 75 MG tablet, Take 1 tablet by mouth daily., Disp: , Rfl:    Continuous Blood Gluc Receiver (FREESTYLE LIBRE 2 READER) DEVI, 1 each by Other route See admin instructions. Glucose check, Disp: , Rfl:    Continuous Blood Gluc Sensor (FREESTYLE LIBRE 14 DAY SENSOR) MISC, Apply topically every 14 (fourteen) days., Disp: , Rfl:  empagliflozin (JARDIANCE) 10 MG TABS tablet, Take 1 tablet by mouth daily., Disp: , Rfl:    hydrALAZINE  (APRESOLINE ) 25 MG tablet, Take 1 tablet (25 mg total) by mouth 3 (three) times daily. 1rst attempt, patient needs and appt for additional refills, Disp: 270 tablet, Rfl: 0   insulin  lispro (HUMALOG ) 100 UNIT/ML injection, Inject  12 Units into the skin 3 (three) times daily with meals. 07/10/23: Reports during TOC call, he was prescribed this new insulin  at time of hospital discharge on 07/08/23- verified was not on his current medication list- added today according to patient report- he reports taking 12 U with each meal, TID, Disp: , Rfl:    isosorbide  mononitrate (IMDUR ) 120 MG 24 hr tablet, Take 1 tablet (120 mg total) by mouth daily., Disp: 90 tablet, Rfl: 3   latanoprost (XALATAN) 0.005 % ophthalmic solution, Place 1 drop into both eyes at bedtime., Disp: , Rfl:    magnesium oxide (MAG-OX) 400 MG tablet, Take 2 tablets by mouth daily., Disp: , Rfl:    methylPREDNISolone  (MEDROL  DOSEPAK) 4 MG TBPK tablet, Take 6 tablets on day 1.  Take 5 tablets on day 2.  Take 4 tablets on day 3.  Take 3 tablets on day 4.  Take 2 tablets on day 5.  Take 1 tablet on day 6., Disp: 21 tablet, Rfl: 0   mycophenolate (MYFORTIC) 180 MG EC tablet, Take 2 tablets by mouth 2 (two) times daily., Disp: , Rfl:    NIFEdipine (PROCARDIA-XL/NIFEDICAL-XL) 30 MG 24 hr tablet, Take 2 tablets by mouth daily., Disp: , Rfl:    predniSONE  (DELTASONE ) 5 MG tablet, Take 5 mg by mouth daily with breakfast., Disp: , Rfl:    rosuvastatin  (CRESTOR ) 20 MG tablet, Take 1 tablet by mouth daily., Disp: , Rfl:    sulfamethoxazole-trimethoprim (BACTRIM) 400-80 MG tablet, Take 1 tablet by mouth 3 (three) times a week., Disp: , Rfl:    tacrolimus  (PROGRAF ) 1 MG capsule, Take 3 mg by mouth 2 (two) times daily., Disp: , Rfl:    torsemide (DEMADEX) 20 MG tablet, Take 20 mg by mouth daily. 07/10/23: Reports during TOC call, he has been taking for a long time; verified was not on his current medication list- added today according to patient report, Disp: , Rfl:    TRESIBA  FLEXTOUCH 200 UNIT/ML FlexTouch Pen, ADMINISTER UP TO 40 UNITS UNDER THE SKIN EVERY DAY (Patient taking differently: 36 Units daily. ADMINISTER UP TO 40 UNITS UNDER THE SKIN EVERY DAY), Disp: 9 mL, Rfl: 1    valGANciclovir (VALCYTE) 450 MG tablet, Take 450 mg by mouth daily., Disp: , Rfl:    Objective:     Vitals:   03/26/24 1319  Pulse: (!) 56  SpO2: 94%  Weight: 162 lb (73.5 kg)  Height: 5' 10 (1.778 m)      Body mass index is 23.24 kg/m.    Physical Exam:    Gen: Appears well, nad, nontoxic and pleasant Neuro:sensation intact, strength is 5/5 with df/pf/inv/ev, muscle tone wnl Skin: no suspicious lesion or defmority Psych: A&O, appropriate mood and affect   Left shoulder:  No deformity, swelling or muscle wasting No scapular winging FF 120, abd 80, int 10, ext 80 with painful arc in all directions NTTP over the [place], clavicle, ac, coracoid, biceps groove, humerus, deltoid, trapezius, cervical spine Positive neer, hawkins, empty can, obriens, crossarm, subscap liftoff, Negative speeds Neg ant drawer, sulcus sign, apprehension Negative Spurling's test bilat  ROM of neck reduced in all directions  TTP cervical paraspinal, left trapezius   Electronically signed by:  Odis Mace D.[name] 1:33 PM 03/26/24

## 2024-03-26 NOTE — Therapy (Incomplete)
 OUTPATIENT PHYSICAL THERAPY TREATMENT   Patient Name: Brandon Carroll MRN: [id number] DOB:[birth year], 63 y.o., male Today's Date: 03/26/2024  END OF SESSION:     Past Medical History:  Diagnosis Date   A-V fistula 03/12/2019   Abnormal CT of the chest 06/08/2015   Abnormal echocardiogram 04/28/2019   Acute systolic CHF (congestive heart failure), NYHA class 2 (HCC) 05/04/2015   Acute-on-chronic kidney injury 05/04/2015   Anemia 06/06/2015   Anemia in chronic kidney disease 11/18/2018   Body mass index (BMI) 27.0-27.9, adult 10/20/2018   Chronic systolic CHF (congestive heart failure) (HCC) 06/15/2015   Coronary artery disease 02/17/2018   Cardiac catheterization 2017 showing 10% LAD and 10% RCA Echocardiogram and stress test done in December 2019 showed evidence of old small inferior lateral myocardial infarction   Diabetes (HCC) 06/20/2019   Diabetes mellitus    Diarrhea, unspecified 10/22/2018   Dyspnea    Encounter for general adult medical examination with abnormal findings 06/12/2019   Erectile dysfunction    ESRD (end stage renal disease) on dialysis [facility])    Essential hypertension 07/03/2016   Fluid overload, unspecified 01/26/2019   Hyperlipidemia    Hyperlipidemia associated with type 2 diabetes mellitus (HCC) 09/18/2011   Started statin therapy 09/2011.  Anticipate improvement as glucose control improves.    Hypertension    Iron deficiency anemia, unspecified 11/03/2018   Low testosterone  10/04/2015   12/01/2015 visit with Donnice Brooks, MD at [facility].    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   Secondary hyperparathyroidism 10/20/2018   Past Surgical History:  Procedure Laterality Date   AV FISTULA PLACEMENT Left  05/26/2018   Procedure: ARTERIOVENOUS (AV) FISTULA CREATION LEFT ARM;  Surgeon: Gretta Lonni PARAS, MD;  Location: [facility] OR;  Service: Vascular;  Laterality: Left;   CARDIAC CATHETERIZATION N/A 06/15/2015   Procedure: Left Heart Cath and Coronary Angiography;  Surgeon: Peter M Jordan, MD;  Location: [facility] INVASIVE CV LAB;  Service: Cardiovascular;  Laterality: N/A;   CHOLECYSTECTOMY N/A 06/28/2017   Procedure: LAPAROSCOPIC CHOLECYSTECTOMY;  Surgeon: Rubin Calamity, MD;  Location: [facility] ORS;  Service: General;  Laterality: N/A;   HERNIA REPAIR  7th grade   umbilical   LEFT HEART CATH AND CORONARY ANGIOGRAPHY N/A 04/28/2019   Procedure: LEFT HEART CATH AND CORONARY ANGIOGRAPHY;  Surgeon: Anner Alm ORN, MD;  Location: [facility] INVASIVE CV LAB;  Service: Cardiovascular;  Laterality: N/A;   Left Kidney Transplant  07/12/2020   Patient Active Problem List   Diagnosis Date Noted   Rash 09/30/2023   Acute pain of left shoulder 09/30/2023   Hx of completed stroke 07/26/2023   Elevated serum creatinine 02/26/2022   Donor specific antibody (DSA) positive 09/13/2021   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   Neck pain 01/28/21   Encounter for general adult medical examination with abnormal findings 28-Jan-2021   Deceased-donor kidney transplant 07/21/2020   Immunosuppression 07/21/2020   Type 2 diabetes mellitus with diabetic nephropathy (HCC) 03/12/2019   A-V fistula 03/12/2019   Anemia in chronic kidney disease 11/18/2018   Coronary artery disease 02/17/2018   Essential hypertension 07/03/2016   Sarcoidosis 07/02/2016   Erectile dysfunction 06/21/2015   Chronic systolic CHF (congestive heart failure) (HCC) 06/15/2015   Hyperlipidemia associated with type 2 diabetes mellitus (  HCC) 09/18/2011    PCP: Rollene Almarie LABOR, MD  REFERRING PROVIDER: Leonce Katz, DO  REFERRING DIAG: [phone] (ICD-10-CM) - Chronic left shoulder  pain M75.52 (ICD-10-CM) - Subacromial bursitis of left shoulder joint M19.012 (ICD-10-CM) - Osteoarthritis of glenohumeral joint, left  THERAPY DIAG:  No diagnosis found.  Rationale for Evaluation and Treatment: Rehabilitation  ONSET DATE: early 2025  SUBJECTIVE:                                                                                                                                                                                     Per eval: States symptoms started in neck, earlier in the year. 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 Also took a flight to [place] in which he states he was positioned awkwardly with his neck. States neck seemed to have ended up mostly resolving but then developed pain in L shoulder about 4-5 months ago. States he got steroid injection in shoulder, helped initially. Had a second shot without any reported relief (about a month ago). Describes symptoms as worsening over time. No N/T, no headaches. Denies double vision, reports some occasional blurry vision. No speech/swallowing issues. No right sided symptoms.  Hand dominance: Right  SUBJECTIVE STATEMENT: 03/26/2024: ***  *** feeling about the same as usual. States he has stopped working with his brother and that has helped a bit. Got MRI done, no results yet back. Has done HEP a little bit, doesn't seem to change pain for better or worse.    PERTINENT HISTORY: LUE AV fistula, CHF, CAD, DM, HTN, sarcoidosis, kidney transplant Self reports CVA in March 2025  PAIN:  Are you having pain: 5/10 L lateral shoulder ***   Per eval:  Location/description: L shoulder, superior lateral; aching Best-worst over past week: 6-9/10 (takes a while to settle)  - aggravating factors: sleeping (usually side sleeper), constant, driving  - Easing factors: ice,sitting in recliner    PRECAUTIONS: LUE AV fistula (denies lifting restrictions, take BP on  RUE)  RED FLAGS: None   WEIGHT BEARING RESTRICTIONS: No  FALLS:  Has patient fallen in last 6 months? No  LIVING ENVIRONMENT: Apartment, 1 level  Lives alone and takes care of home environment without assist   OCCUPATION: Not working - used to work at airport, ramp agent  PLOF: Independent - enjoys working out at [employer], walking  PATIENT GOALS: get back to workout routine, reduce pain  NEXT MD VISIT: MRI 03/20/24, Dr. Leonce follow up TBD   OBJECTIVE:  Note: Objective measures were completed at Evaluation unless otherwise noted.  DIAGNOSTIC FINDINGS:  C spine MRI scheduled for  03/20/24 per EPIC  L shoulder MRI: IMPRESSION: No internal derangement.   Mild acromioclavicular osteoarthritis.      PATIENT SURVEYS:  QuickDASH: 47.73%  COGNITION: Overall cognitive status: Within functional limits for tasks assessed     SENSATION: LT intact BIL UE Negative hoffman/tromner sign BIL   POSTURE: Significant FHP, rounded shoulders, inc kyphosis   CERVICAL SCREEN: Flexion: 35 deg Extension: 45 deg uncomfortable Rotation: 42 deg to R, 35 deg L painful     UPPER EXTREMITY ROM:  A/PROM Right eval Left eval  Shoulder flexion 136 deg 126 deg  Shoulder abduction 150 deg  140 deg   Shoulder internal rotation (functional combo behind back)    Shoulder external rotation (functional combo behind head) Spine of scap Spine of scap  Elbow flexion    Elbow extension    Wrist flexion    Wrist extension     (Blank rows = not tested) (Key: WFL = within functional limits not formally assessed, * = concordant pain, s = stiffness/stretching sensation, NT = not tested, OP= pain with overpressure only)  Comments:    UPPER EXTREMITY MMT:  MMT  Right eval Left eval  Shoulder flexion 4- 4-  Shoulder extension    Shoulder abduction 4 4  Shoulder internal rotation at neutral    Shoulder internal rotation at 45 deg    Shoulder internal rotation at 90 deg     Shoulder external rotation at neutral    Shoulder external rotation at 45 deg    Shoulder external rotation at 90 deg    Elbow flexion    Elbow extension    Grip strength 80# 65#  (Blank rows = not tested)  (Key: WFL = within functional limits not formally assessed, * = concordant pain, s = stiffness/stretching sensation, NT = not tested)  Comments:     PALPATION:  Concordant TTP L UT/LS, otherwise grossly unremarkable                                                                                                                             TREATMENT:   [facility] Adult PT Treatment:                                                DATE: 03/26/24 Therapeutic Exercise: *** Manual Therapy: *** Neuromuscular re-ed: *** Therapeutic Activity: *** Modalities: *** Self Care: ***    [facility] Adult PT Treatment:                                                DATE: 03/24/24 Therapeutic Exercise: Shoulder rolls x10 fwd/back  Supine cervical rotation x8 BIL (2 pillow support) cues for comfortable ROM and breath  control  Supine cervical rotation to R only x8 2 pillow support  Standing swiss ball shoulder flexion x12 cues to relax UT  Standing swiss ball shoulder abduction x12  Standing wall slide x12 cues for posture and comfortable ROM  HEP update + education/handout   Neuromuscular re-ed: Shoulder retraction x10 tactile cues to improve periscapular activation, mitigate lumbar compensations  Scapular retraction w/ foam roll at wall for external 2x10 cues for posture and periscapular stability       [facility] Adult PT Treatment:                                                DATE: 03/17/24 Therapeutic Exercise: Scap retraction, shoulder rolls practice reps + HEP handout/education  Self Care: Education/discussion re: exam findings as they relate to symptom behavior and provocative factors, rationale for interventions, relevant anatomy/physiology, PT POC   PATIENT EDUCATION: Education  details: rationale for interventions, HEP  Person educated: Patient Education method: Explanation, Demonstration, Tactile cues, Verbal cues Education comprehension: verbalized understanding, returned demonstration, verbal cues required, tactile cues required, and needs further education     HOME EXERCISE PROGRAM: Access Code: [email or web address] URL: [email or web address] Date: 03/24/2024 Prepared by: Alm Jenny  Exercises - Seated Scapular Retraction  - 2-3 x daily - 1 sets - 10 reps - Seated Shoulder Rolls  - 2-3 x daily - 1 sets - 10 reps - Shoulder Flexion Wall Slide with Towel  - 2-3 x daily - 1 sets - 8 reps - Seated Shoulder Flexion Towel Slide at Table Top  - 2-3 x daily - 1 sets - 8 reps  ASSESSMENT:  CLINICAL IMPRESSION: 03/26/2024: ***  *** Pt arrives w/ 5/10 pain in lateral L shoulder - overall reports symptoms about the same. Symptoms are fairly consistent throughout session but denies acute worsening. Does have some transient worsening with cervical extension and rotation to L. Best tolerance w/ shoulder mobility exercises, significant difficulty coordinating periscapular movements but improves w/ tactile cues. No adverse events, reports 5-6/10 pain on departure. Recommend continuing along current POC in order to address relevant deficits and improve functional tolerance. Pt departs today's session in no acute distress, all voiced questions/concerns addressed appropriately from PT perspective.    Per eval: Patient is a 63 y.o. gentleman who was seen today for physical therapy evaluation and treatment for shoulder/neck pain ongoing since beginning of year. Red flag questioning reassuring overall. He endorses constant pain, worsening with lying on side, driving, turning head. On exam he demonstrates limitations in L GH mobility but not overtly painful, does have concordant pain with cervical mobility. No increase in resting pain with exam/HEP, no adverse events. Recommend  trial of skilled PT to address aforementioned deficits with aim of improving functional tolerance and reducing pain with typical activities. Pt departs today's session in no acute distress, all voiced concerns/questions addressed appropriately from PT perspective.     OBJECTIVE IMPAIRMENTS: decreased activity tolerance, decreased endurance, decreased mobility, decreased ROM, decreased strength, hypomobility, impaired perceived functional ability, impaired UE functional use, postural dysfunction, and pain.   ACTIVITY LIMITATIONS: carrying, lifting, sleeping, and reach over head  PARTICIPATION LIMITATIONS: meal prep, cleaning, laundry, driving, and community activity  PERSONAL FACTORS: Age, Time since onset of injury/illness/exacerbation, and 3+ comorbidities: LUE AV fistula, CHF, CAD, DM, HTN, sarcoidosis, kidney transplant are also affecting patient's functional outcome.   REHAB POTENTIAL: Fair  given comorbidities and chronicity  CLINICAL DECISION MAKING: Evolving/moderate complexity  EVALUATION COMPLEXITY: Moderate   GOALS:  SHORT TERM GOALS: Target date: 04/14/2024  Pt will demonstrate appropriate understanding and performance of initially prescribed HEP in order to facilitate improved independence with management of symptoms.  Baseline: HEP established  Goal status: INITIAL   2. Pt will report at least 25% improvement in overall pain levels over past week in order to facilitate improved tolerance to typical daily activities.   Baseline: 6-10/10  Goal status: INITIAL    LONG TERM GOALS: Target date: 05/12/2024   Pt will score less than or equal to 30% on Quick DASH in order to indicate reduced levels of disability due to shoulder pain (MDC 16-20pts).  Baseline: 47.73%   Goal status: INITIAL  2.  Pt will demonstrate at least 145 degrees of active shoulder elevation BIL in order to demonstrate improved tolerance to functional movement patterns such as reaching overhead.  Baseline:  see ROM chart above Goal status: INITIAL  3.  Pt will demonstrate at least 50 deg of cervical rotation ROM BIL in order to facilitate improved environmental awareness and safety when driving. Baseline: see ROM chart above Goal status: INITIAL  4. Pt will report at least 50% decrease in overall pain levels in past week in order to facilitate improved tolerance to basic ADLs/mobility.   Baseline: 6-10/10  Goal status: INITIAL    PLAN:  PT FREQUENCY: 1-2x/week  PT DURATION: 8 weeks  PLANNED INTERVENTIONS: 97164- PT Re-evaluation, 97750- Physical Performance Testing, 97110-Therapeutic exercises, 97530- Therapeutic activity, [id number]- Neuromuscular re-education, 97535- Self Care, [id number]- Manual therapy, Patient/Family education, Taping, Joint mobilization, Spinal mobilization, Cryotherapy, and Moist heat  PLAN FOR NEXT SESSION: Review/update HEP PRN. Work on [employer] exercises as appropriate with emphasis on cervical/GH mobility, posture, and periscapular stability. Symptom modification strategies as indicated/appropriate. Mindful of LUE AV fistula   Alm DELENA Jenny PT, DPT 03/26/2024 8:45 AM

## 2024-04-02 ENCOUNTER — Ambulatory Visit (INDEPENDENT_AMBULATORY_CARE_PROVIDER_SITE_OTHER)

## 2024-04-02 ENCOUNTER — Ambulatory Visit (HOSPITAL_BASED_OUTPATIENT_CLINIC_OR_DEPARTMENT_OTHER): Admitting: Pulmonary Disease

## 2024-04-02 ENCOUNTER — Encounter (HOSPITAL_BASED_OUTPATIENT_CLINIC_OR_DEPARTMENT_OTHER): Payer: Self-pay | Admitting: Pulmonary Disease

## 2024-04-02 VITALS — BP 90/58 | HR 71 | Temp 98.2°F | Ht 70.0 in | Wt 168.1 lb

## 2024-04-02 DIAGNOSIS — I959 Hypotension, unspecified: Secondary | ICD-10-CM | POA: Diagnosis not present

## 2024-04-02 DIAGNOSIS — R197 Diarrhea, unspecified: Secondary | ICD-10-CM | POA: Diagnosis not present

## 2024-04-02 DIAGNOSIS — D869 Sarcoidosis, unspecified: Secondary | ICD-10-CM

## 2024-04-02 NOTE — Patient Instructions (Signed)
  VISIT SUMMARY: Today, we discussed your low blood pressure, recent diarrhea, and overall health management. Your kidney function remains stable, and there are no new issues with your sarcoidosis. We made some adjustments to your blood pressure medications due to your recent low readings, likely caused by dehydration from diarrhea.  YOUR PLAN: -SARCOIDOSIS: Sarcoidosis is a condition where inflammatory cells grow in different parts of your body, often the lungs. Your oxygen levels are stable, and there are no new symptoms. We repeated a chest x-ray today to monitor your condition.  -KIDNEY TRANSPLANT, POST-TRANSPLANT STATE WITH DRUG-INDUCED IMMUNOSUPPRESSION: Your kidney function is stable, and there are no signs of rejection. Continue taking Myfortic, Prograf , and prednisone  as prescribed to prevent rejection of your transplanted kidney.  -HYPOTENSION, POSSIBLY EXACERBATED BY DEHYDRATION:  Your blood pressure is currently low, likely due to dehydration from recent diarrhea. We recommend holding off on taking carvedilol , hydralazine , and Imdur  until your systolic blood pressure reaches 100. Please recheck your blood pressure before leaving the clinic and monitor it at home. Resume your medications once your systolic blood pressure is about 100.  -ACUTE DIARRHEA, POSSIBLE DEHYDRATION: Acute diarrhea is a sudden onset of frequent, loose stools. You had 3-4 episodes yesterday, which may have caused dehydration. Please monitor for signs of dehydration, such as dry mouth, dizziness, or decreased urine output.  INSTRUCTIONS: Please recheck your blood pressure before leaving the clinic. Monitor your blood pressure at home and resume your medications once your systolic blood pressure is about 100. Watch for signs of dehydration and stay hydrated.                      Contains text generated by Abridge.                                 Contains text generated  by Abridge.

## 2024-04-02 NOTE — Progress Notes (Signed)
 Subjective:    Patient ID: Brandon Carroll, male    DOB: [birth year], 63 y.o.   MRN: [id number]     63 yo never smoker for follow-up of sarcoid w/ mediastinal adenopathy   -never been biopsied, elev ACE level - former patient of Dr. Christi    PMH-insulin  requiring diabetes,  nonischemic cardiomyopathy and hypertension LHC 04/2019 showed multivessel non -sig disease ESRD on HD M/W/F since 10/2018, -renal transplant 2022, complicated by CMV infection and acute rejection   Discussed the use of AI scribe software for clinical note transcription with the patient, who gave verbal consent to proceed.  History of Present Illness Brandon Carroll is a 63 year old male with hypertension and a kidney transplant who presents with low blood pressure.  He experiences low blood pressure with a systolic reading of 84 mmHg. He adheres to his blood pressure medications, including carvedilol , hydralazine , and Imdur , without any changes in routine. He underwent a kidney transplant two years ago, currently managed with Myfortic, Prograf , and prednisone . Recent blood work indicates stable kidney function. He denies fever, cough, or dehydration but has had recent diarrhea with three to four episodes yesterday, raising concerns about possible dehydration. His sugar levels remain stable.     Significant tests/ events reviewed PFTs 06/2019 -no airway obstruction, FVC 85%, TLC 57% DLCO 17.5/63%     01/2020 Ct chest wo con >> multiple small pulmonary nodules scattered throughout the lungs, which are stable compared to 12/2016      12/2016 CT chest without contrast No substantial interval change compared to 2017 the mediastinal and bilateral hilar lymphadenopathy is similar to prior. Scattered parenchymal and mainly perifissural nodularity also with similar appearance. Dominant 6 mm right upper lobe nodule is unchanged.   05/2018 echo > grade 2 diastolic dysfunction, PA pressure not well estimated 05/2018 VQ  scan negative   Spirometry 05/2015 ratio 78, FEV1 79%, FVC 81%       Review of Systems  neg for any significant sore throat, dysphagia, itching, sneezing, nasal congestion or excess/ purulent secretions, fever, chills, sweats, unintended wt loss, pleuritic or exertional cp, hempoptysis, orthopnea pnd or change in chronic leg swelling. Also denies presyncope, palpitations, heartburn, abdominal pain, nausea, vomiting, diarrhea or change in bowel or urinary habits, dysuria,hematuria, rash, arthralgias, visual complaints, headache, numbness weakness or ataxia.      Objective:   Physical Exam  Gen. Pleasant, well-nourished, in no distress ENT - no thrush, no pallor/icterus,no post nasal drip Neck: No JVD, no thyromegaly, no carotid bruits Lungs: no use of accessory muscles, no dullness to percussion, clear without rales or rhonchi  Cardiovascular: Rhythm regular, heart sounds  normal, no murmurs or gallops, no peripheral edema Musculoskeletal: No deformities, no cyanosis or clubbing        Assessment & Plan:   Assessment and Plan Assessment & Plan Sarcoidosis No new respiratory symptoms or exacerbations. Oxygen levels are stable. - Repeated chest x-ray today  Kidney transplant, post-transplant state with drug-induced immunosuppression Kidney function is stable with no signs of rejection. Continues on Myfortic, Prograf , and prednisone .  Hypotension, possibly exacerbated by dehydration Blood pressure is low at 84/54, possibly due to dehydration from recent diarrhea. No fever or signs of infection. Heart rate is stable. - Hold carvedilol , hydralazine , and Imdur  until systolic blood pressure reaches 100 - Recheck blood pressure before leaving the clinic - Monitor blood pressure at home and resume medications once systolic blood pressure is about 100  Acute diarrhea, possible dehydration Recent onset of  diarrhea with 3-4 episodes yesterday, possibly leading to dehydration. No fever  or abdominal pain reported. - Monitor for signs of dehydration

## 2024-04-08 ENCOUNTER — Ambulatory Visit: Payer: Self-pay | Admitting: Pulmonary Disease

## 2024-04-14 ENCOUNTER — Ambulatory Visit: Admitting: Emergency Medicine

## 2024-04-14 ENCOUNTER — Ambulatory Visit: Payer: Self-pay | Admitting: Emergency Medicine

## 2024-04-14 ENCOUNTER — Ambulatory Visit: Payer: Self-pay

## 2024-04-14 ENCOUNTER — Encounter: Payer: Self-pay | Admitting: Emergency Medicine

## 2024-04-14 ENCOUNTER — Ambulatory Visit: Payer: Self-pay | Admitting: Podiatry

## 2024-04-14 VITALS — BP 124/82 | HR 66 | Temp 100.3°F | Ht 70.0 in | Wt 176.0 lb

## 2024-04-14 DIAGNOSIS — N184 Chronic kidney disease, stage 4 (severe): Secondary | ICD-10-CM

## 2024-04-14 DIAGNOSIS — R112 Nausea with vomiting, unspecified: Secondary | ICD-10-CM | POA: Diagnosis not present

## 2024-04-14 DIAGNOSIS — E1122 Type 2 diabetes mellitus with diabetic chronic kidney disease: Secondary | ICD-10-CM

## 2024-04-14 DIAGNOSIS — Z794 Long term (current) use of insulin: Secondary | ICD-10-CM

## 2024-04-14 DIAGNOSIS — Z91199 Patient's noncompliance with other medical treatment and regimen due to unspecified reason: Secondary | ICD-10-CM

## 2024-04-14 DIAGNOSIS — B349 Viral infection, unspecified: Secondary | ICD-10-CM | POA: Diagnosis not present

## 2024-04-14 LAB — COMPREHENSIVE METABOLIC PANEL WITH GFR
ALT: 17 U/L (ref 0–53)
AST: 20 U/L (ref 0–37)
Albumin: 3.9 g/dL (ref 3.5–5.2)
Alkaline Phosphatase: 70 U/L (ref 39–117)
BUN: 21 mg/dL (ref 6–23)
CO2: 23 meq/L (ref 19–32)
Calcium: 9.3 mg/dL (ref 8.4–10.5)
Chloride: 104 meq/L (ref 96–112)
Creatinine, Ser: 1.72 mg/dL — ABNORMAL HIGH (ref 0.40–1.50)
GFR: 42.55 mL/min — ABNORMAL LOW (ref >60.00)
Glucose, Bld: 132 mg/dL — ABNORMAL HIGH (ref 70–99)
Potassium: 4.5 meq/L (ref 3.5–5.1)
Sodium: 135 meq/L (ref 135–145)
Total Bilirubin: 0.8 mg/dL (ref 0.2–1.2)
Total Protein: 7.4 g/dL (ref 6.0–8.3)

## 2024-04-14 LAB — URINALYSIS, ROUTINE W REFLEX MICROSCOPIC
Leukocytes,Ua: NEGATIVE
Nitrite: NEGATIVE
Specific Gravity, Urine: 1.02 (ref 1.000–1.030)
Total Protein, Urine: >=300 — AB
Urine Glucose: >=1000 — AB
Urobilinogen, UA: 0.2 (ref 0.0–1.0)
pH: 6 (ref 5.0–8.0)

## 2024-04-14 LAB — POCT INFLUENZA A/B
Influenza A, POC: NEGATIVE
Influenza B, POC: NEGATIVE

## 2024-04-14 LAB — POC COVID19 BINAXNOW: SARS Coronavirus 2 Ag: NEGATIVE

## 2024-04-14 LAB — CBC WITH DIFFERENTIAL/PLATELET
Basophils Absolute: 0 K/uL (ref 0.0–0.1)
Basophils Relative: 0.3 % (ref 0.0–3.0)
Eosinophils Absolute: 0 K/uL (ref 0.0–0.7)
Eosinophils Relative: 0 % (ref 0.0–5.0)
HCT: 44.2 % (ref 39.0–52.0)
Hemoglobin: 14.1 g/dL (ref 13.0–17.0)
Lymphocytes Relative: 13.4 % (ref 12.0–46.0)
Lymphs Abs: 1.6 K/uL (ref 0.7–4.0)
MCHC: 32 g/dL (ref 30.0–36.0)
MCV: 78.4 fl (ref 78.0–100.0)
Monocytes Absolute: 1.5 K/uL — ABNORMAL HIGH (ref 0.1–1.0)
Monocytes Relative: 12.2 % — ABNORMAL HIGH (ref 3.0–12.0)
Neutro Abs: 9 K/uL — ABNORMAL HIGH (ref 1.4–7.7)
Neutrophils Relative %: 74.1 % (ref 43.0–77.0)
Platelets: 256 K/uL (ref 150.0–400.0)
RBC: 5.63 Mil/uL (ref 4.22–5.81)
RDW: 15.4 % (ref 11.5–15.5)
WBC: 12.1 K/uL — ABNORMAL HIGH (ref 4.0–10.5)

## 2024-04-14 LAB — GLUCOSE, POCT (MANUAL RESULT ENTRY): POC Glucose: 123 mg/dL — AB (ref 70–99)

## 2024-04-14 MED ORDER — ONDANSETRON 4 MG PO TBDP: 4.0000 mg | ORAL_TABLET | Freq: Three times a day (TID) | ORAL | 1 refills | Status: AC | PRN

## 2024-04-14 NOTE — Assessment & Plan Note (Addendum)
 Clinically stable.  No red flag signs or symptoms. Patient has history of insulin -dependent diabetes and chronic kidney disease.  Will do blood work today to assess for both conditions. Negative for COVID and flu. Differential diagnosis discussed with patient Symptom management discussed Advised to rest and stay well-hydrated Tylenol  and or Advil as needed Zofran  ODT for nausea and vomiting Recommend blood work today ED precautions given Advised to contact the office if no better or worse during the next several days

## 2024-04-14 NOTE — Progress Notes (Signed)
 1. No-show for appointment

## 2024-04-14 NOTE — Assessment & Plan Note (Addendum)
 Symptom management discussed Recommend Zofran  ODT 4 mg as needed Advised to rest and stay well-hydrated Blood work today.  Also recommend urinalysis History of diabetes and chronic kidney disease Uncontrolled diabetes and or uremia in differential diagnosis

## 2024-04-14 NOTE — Progress Notes (Signed)
 Brandon Carroll 63 y.o.   Chief Complaint  Patient presents with   URI    mild vomiting and diarrhea, moderate headache, productive cough with white mucus    HISTORY OF PRESENT ILLNESS: Acute problem visit today. This is a 63 y.o. male complaining of acute onset diarrhea with headache followed by vomiting and slight cough which started abruptly last Friday Started vomiting last night and this morning.  Unable to keep anything down Had fever and chills.  Denies difficulty breathing or chest pain Denies abdominal pain No other associated symptoms No other complaints or medical concerns today.  URI  Associated symptoms include coughing, diarrhea, nausea and vomiting. Pertinent negatives include no abdominal pain, chest pain, congestion, dysuria, headaches, rash or sore throat.     Prior to Admission medications   Medication Sig Start Date End Date Taking? Authorizing Provider  albuterol  (PROVENTIL ) (2.5 MG/3ML) 0.083% nebulizer solution Take 3 mLs (2.5 mg total) by nebulization every 6 (six) hours as needed for wheezing or shortness of breath. 06/21/23  Yes Jude Harden GAILS, MD  aspirin  81 MG chewable tablet Chew 81 mg by mouth once. 07/09/23  Yes [provider]  carvedilol  (COREG ) 6.25 MG tablet Take 6.25 mg by mouth 2 (two) times daily with a meal. 10/27/18  Yes [provider]  clopidogrel (PLAVIX) 75 MG tablet Take 1 tablet by mouth daily. 07/09/23  Yes [provider]  Continuous Blood Gluc Receiver (FREESTYLE LIBRE 2 READER) DEVI 1 each by Other route See admin instructions. Glucose check 02/27/21  Yes [provider]  Continuous Blood Gluc Sensor (FREESTYLE LIBRE 14 DAY SENSOR) MISC Apply topically every 14 (fourteen) days. 07/25/22  Yes [provider]  empagliflozin (JARDIANCE) 10 MG TABS tablet Take 1 tablet by mouth daily. Patient taking differently: Take 25 mg by mouth daily. 04/30/22  Yes [provider]  hydrALAZINE   (APRESOLINE ) 25 MG tablet Take 1 tablet (25 mg total) by mouth 3 (three) times daily. 1rst attempt, patient needs and appt for additional refills 03/11/23  Yes Bernie Lamar PARAS, MD  insulin  lispro (HUMALOG ) 100 UNIT/ML injection Inject 12 Units into the skin 3 (three) times daily with meals. 07/10/23: Reports during TOC call, he was prescribed this new insulin  at time of hospital discharge on 07/08/23- verified was not on his current medication list- added today according to patient report- he reports taking 12 U with each meal, TID   Yes Rollene Almarie LABOR, MD  isosorbide  mononitrate (IMDUR ) 120 MG 24 hr tablet Take 1 tablet (120 mg total) by mouth daily. 12/28/21  Yes Krasowski, Robert J, MD  latanoprost (XALATAN) 0.005 % ophthalmic solution Place 1 drop into both eyes at bedtime. 12/21/21  Yes [provider]  magnesium oxide (MAG-OX) 400 MG tablet Take 2 tablets by mouth daily. 08/11/20  Yes [provider]  mycophenolate (MYFORTIC) 180 MG EC tablet Take 2 tablets by mouth 2 (two) times daily. 10/27/21  Yes [provider]  NIFEdipine (PROCARDIA-XL/NIFEDICAL-XL) 30 MG 24 hr tablet Take 2 tablets by mouth daily. 08/01/20  Yes [provider]  predniSONE  (DELTASONE ) 5 MG tablet Take 5 mg by mouth daily with breakfast.   Yes [provider]  rosuvastatin  (CRESTOR ) 20 MG tablet Take 1 tablet by mouth daily. 07/08/23 07/07/24 Yes [provider]  sulfamethoxazole-trimethoprim (BACTRIM) 400-80 MG tablet Take 1 tablet by mouth 3 (three) times a week. 08/31/22  Yes [provider]  tacrolimus  (PROGRAF ) 1 MG capsule Take 3 mg by mouth 2 (  two) times daily. 08/20/22  Yes [provider]  torsemide (DEMADEX) 20 MG tablet Take 20 mg by mouth daily. 07/10/23: Reports during TOC call, he has been taking for a long time; verified was not on his current medication list- added today according to patient report   Yes Rollene Almarie LABOR, MD  TRESIBA   FLEXTOUCH 200 UNIT/ML FlexTouch Pen ADMINISTER UP TO 40 UNITS UNDER THE SKIN EVERY DAY 06/18/22  Yes Von Pacific, MD  valGANciclovir (VALCYTE) 450 MG tablet Take 450 mg by mouth daily.   Yes [provider]  methylPREDNISolone  (MEDROL  DOSEPAK) 4 MG TBPK tablet Take 6 tablets on day 1.  Take 5 tablets on day 2.  Take 4 tablets on day 3.  Take 3 tablets on day 4.  Take 2 tablets on day 5.  Take 1 tablet on day 6. 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   Neck pain February 01, 2021   Encounter for general adult medical examination with abnormal findings 02/01/2021   Deceased-donor kidney transplant 07/21/2020   Immunosuppression 07/21/2020   Type 2 diabetes mellitus with diabetic nephropathy (HCC) 03/12/2019   A-V fistula 03/12/2019   Anemia in chronic kidney disease 11/18/2018   Coronary artery disease 02/17/2018   Essential hypertension 07/03/2016   Sarcoidosis 07/02/2016   Erectile dysfunction 06/21/2015   Chronic systolic CHF (congestive heart failure) (HCC) 06/15/2015   Hyperlipidemia associated with type 2 diabetes mellitus (HCC) 09/18/2011    Past Medical History:  Diagnosis Date   A-V fistula 03/12/2019   Abnormal CT of the chest 06/08/2015   Abnormal echocardiogram 04/28/2019   Acute systolic CHF (congestive heart failure), NYHA class 2 (HCC) 05/04/2015   Acute-on-chronic kidney injury 05/04/2015   Anemia 06/06/2015   Anemia in chronic kidney disease 11/18/2018   Body mass index (BMI) 27.0-27.9, adult 10/20/2018   Chronic  systolic CHF (congestive heart failure) (HCC) 06/15/2015   Coronary artery disease 02/17/2018   Cardiac catheterization 2017 showing 10% LAD and 10% RCA Echocardiogram and stress test done in December 2019 showed evidence of old small inferior lateral myocardial infarction   Diabetes (HCC) 06/20/2019   Diabetes mellitus    Diarrhea, unspecified 10/22/2018   Dyspnea    Encounter for general adult medical examination with abnormal findings 06/12/2019   Erectile dysfunction    ESRD (end stage renal disease) on dialysis [facility])    Essential hypertension 07/03/2016   Fluid overload, unspecified 01/26/2019   Hyperlipidemia    Hyperlipidemia associated with type 2 diabetes mellitus (HCC) 09/18/2011   Started statin therapy 09/2011.  Anticipate improvement as glucose control improves.    Hypertension    Iron deficiency anemia, unspecified 11/03/2018   Low testosterone  10/04/2015   12/01/2015 visit with Donnice Brooks, MD at [facility].    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   Secondary hyperparathyroidism 10/20/2018  Past Surgical History:  Procedure Laterality Date   AV FISTULA PLACEMENT Left 05/26/2018   Procedure: ARTERIOVENOUS (AV) FISTULA CREATION LEFT ARM;  Surgeon: Gretta Lonni PARAS, MD;  Location: [facility] OR;  Service: Vascular;  Laterality: Left;   CARDIAC CATHETERIZATION N/A 06/15/2015   Procedure: Left Heart Cath and Coronary Angiography;  Surgeon: Peter M Jordan, MD;  Location: [facility] INVASIVE CV LAB;  Service: Cardiovascular;  Laterality: N/A;   CHOLECYSTECTOMY N/A 06/28/2017   Procedure: LAPAROSCOPIC CHOLECYSTECTOMY;  Surgeon: Rubin Calamity, MD;  Location: [facility] ORS;  Service: General;  Laterality: N/A;   HERNIA REPAIR  7th grade   umbilical    LEFT HEART CATH AND CORONARY ANGIOGRAPHY N/A 04/28/2019   Procedure: LEFT HEART CATH AND CORONARY ANGIOGRAPHY;  Surgeon: Anner Alm ORN, MD;  Location: [facility] INVASIVE CV LAB;  Service: Cardiovascular;  Laterality: N/A;   Left Kidney Transplant  07/12/2020    Social History   Socioeconomic History   Marital status: Divorced    Spouse name: n/a   Number of children: 1   Years of education: 12+   Highest education level: Not on file  Occupational History   Occupation: Event Organiser: [employer]  Tobacco Use   Smoking status: Never    Passive exposure: Never   Smokeless tobacco: Never  Vaping Use   Vaping status: Never Used  Substance and Sexual Activity   Alcohol use: No    Alcohol/week: 0.0 standard drinks of alcohol   Drug use: No   Sexual activity: Not Currently    Comment: Erectile dysfunciotn  Other Topics Concern   Not on file  Social History Narrative   Divorced. Education: [employer].    Unable to exercise due to significant SOB and DOE.    Lives alone.   Son lives in [place].   Social Drivers of Corporate Investment Banker Strain: Low Risk  (07/10/2023)   Overall Financial Resource Strain (CARDIA)    Difficulty of Paying Living Expenses: Not hard at all  Food Insecurity: Low Risk  (02/04/2024)   Received from [facility]   Hunger Vital Sign    Within the past 12 months, you worried that your food would run out before you got money to buy more: Never true    Within the past 12 months, the food you bought just didn't last and you didn't have money to get more. : Never true  Transportation Needs: No Transportation Needs (02/04/2024)   Received from [facility]    In the past 12 months, has lack of reliable transportation kept you from medical appointments, meetings, work or from getting things needed for daily living? : No  Physical Activity: Insufficiently Active (07/10/2023)   Exercise Vital Sign    Days of Exercise per Week: 3 days     Minutes of Exercise per Session: 20 min  Stress: Stress Concern Present (07/10/2023)   [employer] of Occupational Health - Occupational Stress Questionnaire    Feeling of Stress : To some extent  Social Connections: Socially Isolated (07/10/2023)   Social Connection and Isolation Panel    Frequency of Communication with Friends and Family: Twice a week    Frequency of Social Gatherings with Friends and Family: Once a week    Attends Religious Services: Never    Database Administrator or Organizations: No    Attends Banker Meetings: Never    Marital Status: Divorced  Catering Manager Violence: Not At Risk (07/10/2023)  Humiliation, Afraid, Rape, and Kick questionnaire    Fear of Current or Ex-Partner: No    Emotionally Abused: No    Physically Abused: No    Sexually Abused: No    Family History  Problem Relation Age of Onset   Arthritis Mother    COPD Mother    Diabetes Mother        was thin, took insulin    Hypertension Mother    Hypertension Father    Arthritis Sister        rheumatoid   COPD Sister    Diabetes Brother    Colon cancer Neg Hx      Review of Systems  Constitutional:  Positive for chills and fever.  HENT: Negative.  Negative for congestion and sore throat.   Respiratory:  Positive for cough.   Cardiovascular: Negative.  Negative for chest pain and palpitations.  Gastrointestinal:  Positive for diarrhea, nausea and vomiting. Negative for abdominal pain.  Genitourinary: Negative.  Negative for dysuria and hematuria.  Skin: Negative.  Negative for rash.  Neurological: Negative.  Negative for dizziness and headaches.    Today's Vitals   04/14/24 1317  BP: 124/82  Pulse: 66  Temp: 100.3 F (37.9 C)  TempSrc: Oral  SpO2: 94%  Weight: 176 lb (79.8 kg)  Height: 5' 10 (1.778 m)   Body mass index is 25.25 kg/m.   Physical Exam Vitals reviewed.  Constitutional:      Appearance: Normal appearance.  HENT:     Head:  Normocephalic.     Right Ear: Tympanic membrane, ear canal and external ear normal.     Left Ear: Tympanic membrane, ear canal and external ear normal.     Mouth/Throat:     Mouth: Mucous membranes are moist.     Pharynx: Oropharynx is clear.  Eyes:     Extraocular Movements: Extraocular movements intact.     Conjunctiva/sclera: Conjunctivae normal.     Pupils: Pupils are equal, round, and reactive to light.  Cardiovascular:     Rate and Rhythm: Normal rate and regular rhythm.     Pulses: Normal pulses.     Heart sounds: Normal heart sounds.  Pulmonary:     Effort: Pulmonary effort is normal.     Breath sounds: Normal breath sounds.  Abdominal:     Palpations: Abdomen is soft.     Tenderness: There is no abdominal tenderness.  Musculoskeletal:     Cervical back: No tenderness.  Lymphadenopathy:     Cervical: No cervical adenopathy.  Skin:    General: Skin is warm and dry.     Capillary Refill: Capillary refill takes less than 2 seconds.  Neurological:     General: No focal deficit present.     Mental Status: He is oriented to person, place, and time.  Psychiatric:        Mood and Affect: Mood normal.        Behavior: Behavior normal.    Results for orders placed or performed in visit on 04/14/24 (from the past 24 hours)  POC COVID-19     Status: Normal   Collection Time: 04/14/24  1:50 PM  Result Value Ref Range   SARS Coronavirus 2 Ag Negative Negative  POCT Influenza A/B     Status: Normal   Collection Time: 04/14/24  1:50 PM  Result Value Ref Range   Influenza A, POC Negative Negative   Influenza B, POC Negative Negative  POCT Glucose (CBG)     Status: Abnormal  Collection Time: 04/14/24  1:50 PM  Result Value Ref Range   POC Glucose 123 (A) 70 - 99 mg/dl  Comprehensive metabolic panel with GFR     Status: Abnormal   Collection Time: 04/14/24  1:52 PM  Result Value Ref Range   Sodium 135 135 - 145 mEq/L   Potassium 4.5 3.5 - 5.1 mEq/L   Chloride 104 96 - 112  mEq/L   CO2 23 19 - 32 mEq/L   Glucose, Bld 132 (H) 70 - 99 mg/dL   BUN 21 6 - 23 mg/dL   Creatinine, Ser 8.27 (H) 0.40 - 1.50 mg/dL   Total Bilirubin 0.8 0.2 - 1.2 mg/dL   Alkaline Phosphatase 70 39 - 117 U/L   AST 20 0 - 37 U/L   ALT 17 0 - 53 U/L   Total Protein 7.4 6.0 - 8.3 g/dL   Albumin  3.9 3.5 - 5.2 g/dL   GFR 57.44 (L) >39.99 mL/min   Calcium  9.3 8.4 - 10.5 mg/dL  CBC with Differential/Platelet     Status: Abnormal   Collection Time: 04/14/24  1:52 PM  Result Value Ref Range   WBC 12.1 (H) 4.0 - 10.5 K/uL   RBC 5.63 4.22 - 5.81 Mil/uL   Hemoglobin 14.1 13.0 - 17.0 g/dL   HCT 55.7 60.9 - 47.9 %   MCV 78.4 78.0 - 100.0 fl   MCHC 32.0 30.0 - 36.0 g/dL   RDW 84.5 88.4 - 84.4 %   Platelets 256.0 150.0 - 400.0 K/uL   Neutrophils Relative % 74.1 43.0 - 77.0 %   Lymphocytes Relative 13.4 12.0 - 46.0 %   Monocytes Relative 12.2 (H) 3.0 - 12.0 %   Eosinophils Relative 0.0 0.0 - 5.0 %   Basophils Relative 0.3 0.0 - 3.0 %   Neutro Abs 9.0 (H) 1.4 - 7.7 K/uL   Lymphs Abs 1.6 0.7 - 4.0 K/uL   Monocytes Absolute 1.5 (H) 0.1 - 1.0 K/uL   Eosinophils Absolute 0.0 0.0 - 0.7 K/uL   Basophils Absolute 0.0 0.0 - 0.1 K/uL  Urinalysis, Routine w reflex microscopic     Status: Abnormal   Collection Time: 04/14/24  1:52 PM  Result Value Ref Range   Color, Urine YELLOW Yellow;Lt. Yellow;Straw;Dark Yellow;Amber;Green;Red;Brown   APPearance CLEAR Clear;Turbid;Slightly Cloudy;Cloudy   Specific Gravity, Urine 1.020 1.000 - 1.030   pH 6.0 5.0 - 8.0   Total Protein, Urine >=300 (A) Negative   Urine Glucose >=1000 (A) Negative   Ketones, ur TRACE (A) Negative   Bilirubin Urine SMALL (A) Negative   Hgb urine dipstick TRACE-INTACT (A) Negative   Urobilinogen, UA 0.2 0.0 - 1.0   Leukocytes,Ua NEGATIVE Negative   Nitrite NEGATIVE Negative   WBC, UA 0-2/hpf 0-2/hpf   RBC / HPF 0-2/hpf 0-2/hpf   Squamous Epithelial / HPF Rare(0-4/hpf) Rare(0-4/hpf)   Bacteria, UA Few(10-50/hpf) (A) None      ASSESSMENT & PLAN: Problem List Items Addressed This Visit       Digestive   Nausea and vomiting   Symptom management discussed Recommend Zofran  ODT 4 mg as needed Advised to rest and stay well-hydrated Blood work today.  Also recommend urinalysis History of diabetes and chronic kidney disease Uncontrolled diabetes and or uremia in differential diagnosis      Relevant Medications   ondansetron  (ZOFRAN -ODT) 4 MG disintegrating tablet   Other Relevant Orders   POC COVID-19 (Completed)   POCT Influenza A/B (Completed)   CBC with Differential/Platelet (Completed)   Comprehensive metabolic  panel with GFR (Completed)   Urinalysis   Urine Culture     Other   Viral illness - Primary   Clinically stable.  No red flag signs or symptoms. Patient has history of insulin -dependent diabetes and chronic kidney disease.  Will do blood work today to assess for both conditions. Negative for COVID and flu. Differential diagnosis discussed with patient Symptom management discussed Advised to rest and stay well-hydrated Tylenol  and or Advil as needed Zofran  ODT for nausea and vomiting Recommend blood work today ED precautions given Advised to contact the office if no better or worse during the next several days      Relevant Orders   POC COVID-19 (Completed)   POCT Influenza A/B (Completed)   CBC with Differential/Platelet (Completed)   Comprehensive metabolic panel with GFR (Completed)   Urinalysis   Urine Culture   Other Visit Diagnoses       Type 2 diabetes mellitus with stage 4 chronic kidney disease, with long-term current use of insulin  (HCC)       Relevant Orders   POCT Glucose (CBG) (Completed)      Patient Instructions  Viral Illness, Adult Viruses are tiny germs that can get into a person's body and cause illness. There are many different types of viruses. And they cause many types of illness. Viral illnesses can range from mild to severe. They can affect various  parts of the body. Short-term conditions that are caused by a virus include colds and flu (influenza) and stomach viruses. Long-term conditions that are caused by a virus include herpes, shingles, and human immunodeficiency virus (HIV) infection. A few viruses have been linked to certain cancers. What are the causes? Many types of viruses can cause illness. Viruses get into cells in your body, multiply, and cause the infected cells to work differently or die. When these cells die, they release more of the virus. When this happens, you get symptoms of the illness and the virus spreads to other cells. If the virus takes over how the cell works, it can cause the cell to divide and grow out of control. This happens when a virus causes cancer. Different viruses get into the body in different ways. You can get a virus by: Swallowing food or water that has come in contact with the virus. Breathing in droplets that have been coughed or sneezed into the air by an infected person. Touching a surface that has the virus on it and then touching your eyes, nose, or mouth. Being bitten by an insect or animal that carries the virus. Having sexual contact with a person who is infected with the virus. Being exposed to blood or fluids that contain the virus, either through an open cut or during a transfusion. If a virus enters your body, your body's disease-fighting system (immune system) will try to fight the virus. You may be at higher risk for a viral illness if your immune system is weak. What are the signs or symptoms? Symptoms depend on the type of virus and the location of the cells that it gets into. Symptoms can include: For cold and flu viruses: Fever. Headache. Sore throat. Muscle aches. Stuffy nose (nasal congestion). Cough. For stomach (gastrointestinal) viruses: Fever. Pain in the abdomen. Nausea or vomiting. Diarrhea. For liver viruses (hepatitis): Loss of appetite. Feeling tired. Skin or  the white parts of your eyes turning yellow (jaundice). For brain and spinal cord viruses: Fever. Headache. Stiff neck. Nausea and vomiting. Confusion or being sleepy. For skin  viruses: Warts. Itching. Rash. For sexually transmitted viruses: Discharge. Swelling. Redness. Rash. How is this diagnosed? This condition may be diagnosed based on one or more of these: Your symptoms and medical history. A physical exam. Tests, such as: Blood tests. Tests on a sample of mucus from your lungs (sputum sample). Tests on a poop (stool) sample. Tests on a swab of body fluids or a skin sore (lesion). How is this treated? Viruses can be hard to treat because they live within cells. Antibiotics do not treat viruses because these medicines do not get inside cells. Treatment for a viral illness may include: Resting and drinking a lot of fluids. Medicines to treat symptoms. These can include over-the-counter medicine for pain and fever, medicines for cough or congestion, and medicines for diarrhea. Antiviral medicines. These medicines are available only for certain types of viruses. Some viral illnesses can be prevented with vaccinations. A common example is the flu shot. Follow these instructions at home: Medicines Take over-the-counter and prescription medicines only as told by your health care provider. If you were prescribed an antiviral medicine, take it as told by your provider. Do not stop taking the antiviral even if you start to feel better. Know when antibiotics are needed and when they are not needed. Antibiotics do not treat viruses. You may get an antibiotic if your provider thinks that you may have, or are at risk for, a bacterial infection and you have a viral infection. Do not ask for an antibiotic prescription if you have been diagnosed with a viral illness. Antibiotics will not make your illness go away faster. Taking antibiotics when they are not needed can lead to antibiotic  resistance. When this develops, the medicine no longer works against the bacteria that it normally fights. General instructions Drink enough fluids to keep your pee (urine) pale yellow. Rest as much as possible. Return to your normal activities as told by your provider. Ask your provider what activities are safe for you. How is this prevented? To lower your risk of getting another viral illness: Wash your hands often with soap and water for at least 20 seconds. If soap and water are not available, use hand sanitizer. Avoid touching your nose, eyes, and mouth, especially if you have not washed your hands recently. If anyone in your household has a viral infection, clean all household surfaces that may have been in contact with the virus. Use soap and hot water. You may also use a commercially prepared, bleach-containing solution. Stay away from people who are sick with symptoms of a viral infection. Do not share items such as toothbrushes and water bottles with other people. Keep your vaccinations up to date. This includes getting a yearly flu shot. Eat a healthy diet and get plenty of rest. Contact a health care provider if: You have symptoms of a viral illness that do not go away. Your symptoms come back after going away. Your symptoms get worse. Get help right away if: You have trouble breathing. You have a severe headache or a stiff neck. You have severe vomiting or pain in your abdomen. These symptoms may be an emergency. Get help right away. Call 911. Do not wait to see if the symptoms will go away. Do not drive yourself to the hospital. This information is not intended to replace advice given to you by your health care provider. Make sure you discuss any questions you have with your health care provider. Document Revised: 05/16/2022 Document Reviewed: 02/28/2022 Elsevier Patient Education  2024 [employer]    Emil Schaumann, MD [facility]

## 2024-04-14 NOTE — Telephone Encounter (Signed)
 FYI Only or Action Required?: FYI only for provider: appointment scheduled on 04/14/24.  Patient was last seen in primary care on 09/30/2023 by Brandon Carroll LABOR, MD.  Called Nurse Triage reporting Diarrhea, Cough, Vomiting, and Headache.  Symptoms began several days ago.  Interventions attempted: Rest, hydration, or home remedies; OTC medications: tylenol , cold medicine, Muccinex.  Symptoms are: gradually worsening.  Triage Disposition: See Physician Within 24 Hours (overriding Home Care)  Patient/caregiver understands and will follow disposition?: Yes              Copied from CRM #[id number]. Topic: Clinical - Red Word Triage >> Apr 14, 2024  9:13 AM Brandon Carroll wrote: Red Word that prompted transfer to Nurse Triage: Patient states he is unable to keep any food or drinks down since last night. States he has diarrhea,vomiting, and headache. Reason for Disposition  MILD vomiting with diarrhea  Answer Assessment - Initial Assessment Questions 1. VOMITING SEVERITY: How many times have you vomited in the past 24 hours?      3 episodes last night, once this morning.  2. ONSET: When did the vomiting begin?      Last night, within an hour of eating food.   3. FLUIDS: What fluids or food have you vomited up today? Have you been able to keep any fluids down?     He was not able to eat last night, vomited the food and tried to eat oatmeal and a banana this morning and threw that up as well. He states he has 64 fluid ounces of water daily and has been able to keep it down.  4. ABDOMEN PAIN: Are your having any abdomen pain? If Yes : How bad is it and what does it feel like? (e.g., crampy, dull, intermittent, constant)      No.  5. DIARRHEA: Is there any diarrhea? If Yes, ask: How many times today?      Yes. 1 episode of diarrhea today.  6. CONTACTS: Is there anyone else in the family with the same symptoms?      No.  7. CAUSE: What do you think is causing  your vomiting?     Thinks possibly a virus or acute illness due to having a productive cough with white mucous.   8. HYDRATION STATUS: Any signs of dehydration? (e.g., dry mouth [not only dry lips], too weak to stand) When did you last urinate?     Headache 6-7/10. He states he was able to urinate this morning.  9. OTHER SYMPTOMS: Do you have any other symptoms? (e.g., fever, headache, vertigo, vomiting blood or coffee grounds, recent head injury)     No fever, recent head or abdominal injury, vomiting blood or coffee grounds. 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 He has been treating headache with Tylenol .  Protocols used: Vomiting-A-AH

## 2024-04-14 NOTE — Patient Instructions (Signed)
 Viral Illness, Adult Viruses are tiny germs that can get into a person's body and cause illness. There are many different types of viruses. And they cause many types of illness. Viral illnesses can range from mild to severe. They can affect various parts of the body. Short-term conditions that are caused by a virus include colds and flu (influenza) and stomach viruses. Long-term conditions that are caused by a virus include herpes, shingles, and human immunodeficiency virus (HIV) infection. A few viruses have been linked to certain cancers. What are the causes? Many types of viruses can cause illness. Viruses get into cells in your body, multiply, and cause the infected cells to work differently or die. When these cells die, they release more of the virus. When this happens, you get symptoms of the illness and the virus spreads to other cells. If the virus takes over how the cell works, it can cause the cell to divide and grow out of control. This happens when a virus causes cancer. Different viruses get into the body in different ways. You can get a virus by: Swallowing food or water that has come in contact with the virus. Breathing in droplets that have been coughed or sneezed into the air by an infected person. Touching a surface that has the virus on it and then touching your eyes, nose, or mouth. Being bitten by an insect or animal that carries the virus. Having sexual contact with a person who is infected with the virus. Being exposed to blood or fluids that contain the virus, either through an open cut or during a transfusion. If a virus enters your body, your body's disease-fighting system (immune system) will try to fight the virus. You may be at higher risk for a viral illness if your immune system is weak. What are the signs or symptoms? Symptoms depend on the type of virus and the location of the cells that it gets into. Symptoms can include: For cold and flu  viruses: Fever. Headache. Sore throat. Muscle aches. Stuffy nose (nasal congestion). Cough. For stomach (gastrointestinal) viruses: Fever. Pain in the abdomen. Nausea or vomiting. Diarrhea. For liver viruses (hepatitis): Loss of appetite. Feeling tired. Skin or the white parts of your eyes turning yellow (jaundice). For brain and spinal cord viruses: Fever. Headache. Stiff neck. Nausea and vomiting. Confusion or being sleepy. For skin viruses: Warts. Itching. Rash. For sexually transmitted viruses: Discharge. Swelling. Redness. Rash. How is this diagnosed? This condition may be diagnosed based on one or more of these: Your symptoms and medical history. A physical exam. Tests, such as: Blood tests. Tests on a sample of mucus from your lungs (sputum sample). Tests on a poop (stool) sample. Tests on a swab of body fluids or a skin sore (lesion). How is this treated? Viruses can be hard to treat because they live within cells. Antibiotics do not treat viruses because these medicines do not get inside cells. Treatment for a viral illness may include: Resting and drinking a lot of fluids. Medicines to treat symptoms. These can include over-the-counter medicine for pain and fever, medicines for cough or congestion, and medicines for diarrhea. Antiviral medicines. These medicines are available only for certain types of viruses. Some viral illnesses can be prevented with vaccinations. A common example is the flu shot. Follow these instructions at home: Medicines Take over-the-counter and prescription medicines only as told by your health care provider. If you were prescribed an antiviral medicine, take it as told by your provider. Do not stop  taking the antiviral even if you start to feel better. Know when antibiotics are needed and when they are not needed. Antibiotics do not treat viruses. You may get an antibiotic if your provider thinks that you may have, or are at risk  for, a bacterial infection and you have a viral infection. Do not ask for an antibiotic prescription if you have been diagnosed with a viral illness. Antibiotics will not make your illness go away faster. Taking antibiotics when they are not needed can lead to antibiotic resistance. When this develops, the medicine no longer works against the bacteria that it normally fights. General instructions Drink enough fluids to keep your pee (urine) pale yellow. Rest as much as possible. Return to your normal activities as told by your provider. Ask your provider what activities are safe for you. How is this prevented? To lower your risk of getting another viral illness: Wash your hands often with soap and water for at least 20 seconds. If soap and water are not available, use hand sanitizer. Avoid touching your nose, eyes, and mouth, especially if you have not washed your hands recently. If anyone in your household has a viral infection, clean all household surfaces that may have been in contact with the virus. Use soap and hot water. You may also use a commercially prepared, bleach-containing solution. Stay away from people who are sick with symptoms of a viral infection. Do not share items such as toothbrushes and water bottles with other people. Keep your vaccinations up to date. This includes getting a yearly flu shot. Eat a healthy diet and get plenty of rest. Contact a health care provider if: You have symptoms of a viral illness that do not go away. Your symptoms come back after going away. Your symptoms get worse. Get help right away if: You have trouble breathing. You have a severe headache or a stiff neck. You have severe vomiting or pain in your abdomen. These symptoms may be an emergency. Get help right away. Call 911. Do not wait to see if the symptoms will go away. Do not drive yourself to the hospital. This information is not intended to replace advice given to you by your health  care provider. Make sure you discuss any questions you have with your health care provider. Document Revised: 05/16/2022 Document Reviewed: 02/28/2022 Elsevier Patient Education  2024 [employer].

## 2024-04-15 LAB — URINE CULTURE: Result:: NO GROWTH

## 2024-06-03 ENCOUNTER — Ambulatory Visit: Admitting: Podiatry

## 2024-06-03 ENCOUNTER — Encounter: Payer: Self-pay | Admitting: Podiatry

## 2024-06-03 DIAGNOSIS — M79676 Pain in unspecified toe(s): Secondary | ICD-10-CM | POA: Diagnosis not present

## 2024-06-03 DIAGNOSIS — Z794 Long term (current) use of insulin: Secondary | ICD-10-CM | POA: Diagnosis not present

## 2024-06-03 DIAGNOSIS — E1121 Type 2 diabetes mellitus with diabetic nephropathy: Secondary | ICD-10-CM

## 2024-06-03 DIAGNOSIS — B351 Tinea unguium: Secondary | ICD-10-CM | POA: Diagnosis not present

## 2024-06-03 NOTE — Progress Notes (Addendum)
 This patient returns to my office for at risk foot care.  This patient requires this care by a professional since this patient will be at risk due to having diabetes and kidney disease and coagulation defect due to plavix.This patient is unable to cut nails himself since the patient cannot reach his nails.These nails are painful walking and wearing shoes.  This patient presents for at risk foot care today.  General Appearance  Alert, conversant and in no acute stress.  Vascular  Dorsalis pedis and posterior tibial  pulses are palpable  bilaterally.  Capillary return is within normal limits  bilaterally. Temperature is within normal limits  bilaterally.  Neurologic  Senn-Weinstein monofilament wire test within normal limits  bilaterally. Muscle power within normal limits bilaterally.  Nails Thick disfigured discolored nails with subungual debris  from hallux to fifth toes bilaterally. No evidence of bacterial infection or drainage bilaterally.  Orthopedic  No limitations of motion  feet .  No crepitus or effusions noted.  No bony pathology or digital deformities noted. HAV  B/L.  Skin  normotropic skin with no porokeratosis noted bilaterally.  No signs of infections or ulcers noted.     Onychomycosis  Pain in right toes  Pain in left toes  Consent was obtained for treatment procedures.   Mechanical debridement of nails 1-5  bilaterally performed with a nail nipper.  Filed with dremel without incident.    Return office visit    3 months                  Told patient to return for periodic foot care and evaluation due to potential at risk complications.   Cordella Bold DPM

## 2024-09-02 ENCOUNTER — Ambulatory Visit: Admitting: Podiatry

## 2024-09-10 ENCOUNTER — Ambulatory Visit: Payer: 59
